# Patient Record
Sex: Female | Born: 1965 | Race: White | Hispanic: No | Marital: Married | Smoking: Former smoker
Health system: Southern US, Community
[De-identification: ages and names within clinical notes are randomized; demographics above are authoritative.]

## PROBLEM LIST (undated history)

## (undated) DIAGNOSIS — F319 Bipolar disorder, unspecified: Secondary | ICD-10-CM

## (undated) DIAGNOSIS — N189 Chronic kidney disease, unspecified: Secondary | ICD-10-CM

## (undated) DIAGNOSIS — E669 Obesity, unspecified: Secondary | ICD-10-CM

## (undated) DIAGNOSIS — F329 Major depressive disorder, single episode, unspecified: Secondary | ICD-10-CM

## (undated) DIAGNOSIS — K219 Gastro-esophageal reflux disease without esophagitis: Secondary | ICD-10-CM

## (undated) DIAGNOSIS — R55 Syncope and collapse: Secondary | ICD-10-CM

## (undated) DIAGNOSIS — F419 Anxiety disorder, unspecified: Secondary | ICD-10-CM

## (undated) DIAGNOSIS — T8859XA Other complications of anesthesia, initial encounter: Secondary | ICD-10-CM

## (undated) DIAGNOSIS — H269 Unspecified cataract: Secondary | ICD-10-CM

## (undated) DIAGNOSIS — G43909 Migraine, unspecified, not intractable, without status migrainosus: Secondary | ICD-10-CM

## (undated) DIAGNOSIS — D869 Sarcoidosis, unspecified: Secondary | ICD-10-CM

## (undated) DIAGNOSIS — Z8041 Family history of malignant neoplasm of ovary: Secondary | ICD-10-CM

## (undated) DIAGNOSIS — Z803 Family history of malignant neoplasm of breast: Secondary | ICD-10-CM

## (undated) DIAGNOSIS — K589 Irritable bowel syndrome without diarrhea: Secondary | ICD-10-CM

## (undated) DIAGNOSIS — G2581 Restless legs syndrome: Secondary | ICD-10-CM

## (undated) DIAGNOSIS — F32A Depression, unspecified: Secondary | ICD-10-CM

## (undated) DIAGNOSIS — M8580 Other specified disorders of bone density and structure, unspecified site: Secondary | ICD-10-CM

## (undated) DIAGNOSIS — E785 Hyperlipidemia, unspecified: Secondary | ICD-10-CM

## (undated) DIAGNOSIS — G4730 Sleep apnea, unspecified: Secondary | ICD-10-CM

## (undated) DIAGNOSIS — D649 Anemia, unspecified: Secondary | ICD-10-CM

## (undated) DIAGNOSIS — Z8489 Family history of other specified conditions: Secondary | ICD-10-CM

## (undated) DIAGNOSIS — G250 Essential tremor: Secondary | ICD-10-CM

## (undated) DIAGNOSIS — T5494XA Toxic effect of unspecified corrosive substance, undetermined, initial encounter: Secondary | ICD-10-CM

## (undated) DIAGNOSIS — E039 Hypothyroidism, unspecified: Secondary | ICD-10-CM

## (undated) HISTORY — PX: ABDOMINAL HYSTERECTOMY: SHX81

## (undated) HISTORY — PX: TOTAL ABDOMINAL HYSTERECTOMY: SHX209

## (undated) HISTORY — DX: Syncope and collapse: R55

## (undated) HISTORY — DX: Family history of malignant neoplasm of breast: Z80.3

## (undated) HISTORY — PX: INGUINAL HERNIA REPAIR: SHX194

## (undated) HISTORY — DX: Hyperlipidemia, unspecified: E78.5

## (undated) HISTORY — DX: Toxic effect of unspecified corrosive substance, undetermined, initial encounter: T54.94XA

## (undated) HISTORY — DX: Migraine, unspecified, not intractable, without status migrainosus: G43.909

## (undated) HISTORY — DX: Hypothyroidism, unspecified: E03.9

## (undated) HISTORY — PX: HERNIA REPAIR: SHX51

## (undated) HISTORY — PX: BLADDER SURGERY: SHX569

## (undated) HISTORY — PX: CHOLECYSTECTOMY: SHX55

## (undated) HISTORY — DX: Gastro-esophageal reflux disease without esophagitis: K21.9

## (undated) HISTORY — DX: Obesity, unspecified: E66.9

## (undated) HISTORY — DX: Depression, unspecified: F32.A

## (undated) HISTORY — PX: OTHER SURGICAL HISTORY: SHX169

## (undated) HISTORY — DX: Anxiety disorder, unspecified: F41.9

## (undated) HISTORY — DX: Unspecified cataract: H26.9

## (undated) HISTORY — PX: SALPINGOOPHORECTOMY: SHX82

## (undated) HISTORY — PX: APPENDECTOMY: SHX54

## (undated) HISTORY — DX: Major depressive disorder, single episode, unspecified: F32.9

## (undated) HISTORY — DX: Family history of malignant neoplasm of ovary: Z80.41

---

## 2002-10-19 DIAGNOSIS — Z90711 Acquired absence of uterus with remaining cervical stump: Secondary | ICD-10-CM

## 2002-10-19 HISTORY — DX: Acquired absence of uterus with remaining cervical stump: Z90.711

## 2002-10-19 HISTORY — PX: LAPAROSCOPIC SUPRACERVICAL HYSTERECTOMY: SUR797

## 2004-10-06 ENCOUNTER — Ambulatory Visit (HOSPITAL_COMMUNITY): Payer: Self-pay | Admitting: Professional Counselor

## 2004-10-15 ENCOUNTER — Ambulatory Visit (HOSPITAL_COMMUNITY): Payer: Self-pay | Admitting: Professional Counselor

## 2004-10-20 ENCOUNTER — Ambulatory Visit (HOSPITAL_COMMUNITY): Payer: Self-pay | Admitting: Professional Counselor

## 2004-10-27 ENCOUNTER — Ambulatory Visit (HOSPITAL_COMMUNITY): Payer: Self-pay | Admitting: Professional Counselor

## 2005-12-29 ENCOUNTER — Ambulatory Visit: Payer: Self-pay | Admitting: Psychiatry

## 2006-07-16 ENCOUNTER — Emergency Department: Payer: Self-pay | Admitting: Emergency Medicine

## 2006-08-09 ENCOUNTER — Emergency Department: Payer: Self-pay | Admitting: Emergency Medicine

## 2006-08-10 ENCOUNTER — Ambulatory Visit: Payer: Self-pay | Admitting: Emergency Medicine

## 2006-08-31 ENCOUNTER — Inpatient Hospital Stay: Payer: Self-pay

## 2006-12-09 ENCOUNTER — Emergency Department: Payer: Self-pay

## 2006-12-09 ENCOUNTER — Other Ambulatory Visit: Payer: Self-pay

## 2007-06-16 ENCOUNTER — Emergency Department: Payer: Self-pay | Admitting: Emergency Medicine

## 2007-06-30 ENCOUNTER — Emergency Department: Payer: Self-pay | Admitting: Unknown Physician Specialty

## 2007-07-06 ENCOUNTER — Emergency Department: Payer: Self-pay | Admitting: Emergency Medicine

## 2007-07-06 ENCOUNTER — Other Ambulatory Visit: Payer: Self-pay

## 2007-07-28 ENCOUNTER — Other Ambulatory Visit: Payer: Self-pay

## 2007-07-28 ENCOUNTER — Emergency Department: Payer: Self-pay | Admitting: Emergency Medicine

## 2007-08-10 ENCOUNTER — Ambulatory Visit: Payer: Self-pay | Admitting: General Surgery

## 2007-08-26 ENCOUNTER — Ambulatory Visit: Payer: Self-pay | Admitting: General Surgery

## 2007-09-01 ENCOUNTER — Inpatient Hospital Stay: Payer: Self-pay | Admitting: General Surgery

## 2008-01-31 ENCOUNTER — Emergency Department: Payer: Self-pay | Admitting: Emergency Medicine

## 2008-04-18 ENCOUNTER — Ambulatory Visit: Payer: Self-pay | Admitting: Oncology

## 2008-04-19 ENCOUNTER — Other Ambulatory Visit: Payer: Self-pay

## 2008-04-19 ENCOUNTER — Observation Stay: Payer: Self-pay | Admitting: Internal Medicine

## 2008-04-23 ENCOUNTER — Ambulatory Visit: Payer: Self-pay | Admitting: Oncology

## 2008-04-30 ENCOUNTER — Ambulatory Visit: Payer: Self-pay | Admitting: Oncology

## 2008-05-19 ENCOUNTER — Ambulatory Visit: Payer: Self-pay | Admitting: Oncology

## 2008-05-25 ENCOUNTER — Ambulatory Visit: Payer: Self-pay | Admitting: General Surgery

## 2008-05-29 ENCOUNTER — Ambulatory Visit: Payer: Self-pay | Admitting: General Surgery

## 2008-06-19 ENCOUNTER — Ambulatory Visit: Payer: Self-pay | Admitting: Oncology

## 2009-08-02 ENCOUNTER — Ambulatory Visit: Payer: Self-pay | Admitting: General Surgery

## 2009-08-09 ENCOUNTER — Ambulatory Visit: Payer: Self-pay | Admitting: General Surgery

## 2010-03-12 ENCOUNTER — Ambulatory Visit: Payer: Self-pay | Admitting: Cardiovascular Disease

## 2010-03-24 ENCOUNTER — Ambulatory Visit: Payer: Self-pay | Admitting: Neurology

## 2010-03-25 ENCOUNTER — Ambulatory Visit: Payer: Self-pay | Admitting: Neurology

## 2010-04-25 ENCOUNTER — Ambulatory Visit: Payer: Self-pay | Admitting: Physician Assistant

## 2010-06-17 ENCOUNTER — Emergency Department: Payer: Self-pay | Admitting: Emergency Medicine

## 2010-10-05 ENCOUNTER — Emergency Department: Payer: Self-pay | Admitting: Emergency Medicine

## 2011-04-21 ENCOUNTER — Emergency Department: Payer: Self-pay | Admitting: Internal Medicine

## 2011-10-29 ENCOUNTER — Emergency Department: Payer: Self-pay | Admitting: Emergency Medicine

## 2011-11-24 ENCOUNTER — Emergency Department: Payer: Self-pay | Admitting: Emergency Medicine

## 2012-01-14 ENCOUNTER — Encounter: Payer: Self-pay | Admitting: Internal Medicine

## 2012-02-11 ENCOUNTER — Encounter: Payer: Self-pay | Admitting: Internal Medicine

## 2012-02-17 ENCOUNTER — Encounter: Payer: Self-pay | Admitting: Internal Medicine

## 2012-03-15 ENCOUNTER — Encounter: Payer: Self-pay | Admitting: *Deleted

## 2012-03-15 ENCOUNTER — Other Ambulatory Visit: Payer: Self-pay | Admitting: *Deleted

## 2012-03-17 ENCOUNTER — Ambulatory Visit (INDEPENDENT_AMBULATORY_CARE_PROVIDER_SITE_OTHER): Payer: BC Managed Care – PPO | Admitting: Internal Medicine

## 2012-03-17 ENCOUNTER — Encounter: Payer: Self-pay | Admitting: Internal Medicine

## 2012-03-17 VITALS — BP 102/68 | HR 88 | Ht 64.0 in | Wt 207.0 lb

## 2012-03-17 DIAGNOSIS — F329 Major depressive disorder, single episode, unspecified: Secondary | ICD-10-CM

## 2012-03-17 DIAGNOSIS — R55 Syncope and collapse: Secondary | ICD-10-CM | POA: Insufficient documentation

## 2012-03-17 DIAGNOSIS — I471 Supraventricular tachycardia: Secondary | ICD-10-CM

## 2012-03-17 DIAGNOSIS — I498 Other specified cardiac arrhythmias: Secondary | ICD-10-CM

## 2012-03-17 DIAGNOSIS — G43909 Migraine, unspecified, not intractable, without status migrainosus: Secondary | ICD-10-CM

## 2012-03-17 NOTE — Assessment & Plan Note (Signed)
As above.

## 2012-03-17 NOTE — Progress Notes (Deleted)
   Patient ID: Jennifer Bright, female    DOB: 1966/04/07, 46 y.o.   MRN: [id number]  HPI    Review of Systems    Physical Exam

## 2012-03-17 NOTE — Assessment & Plan Note (Signed)
The patient has recurrent syncope with documented normal rhythm by previous event recorder. There are some epiphenomena that suggest a neurally mediated episode; the lack of pallor speaking against it. Furthermore, I am not sure why either Lamictal or Topamax would be effective in a neurally mediated syndrome; I will have to rate looked this up. In any case, given the recurrence is, I think it is reasonable to undertake tilt table testing. Given the fact that multiple times after an episode her vital signs have been recorded as normal, and arterial line will be necessary at the time of the procedure. I reviewed this with the family

## 2012-03-17 NOTE — Progress Notes (Signed)
 History and Physical  Patient ID: Jennifer Bright MRN: [id number], SOB: 11/02/1965 46 y.o. Date of Encounter: 03/17/2012, 5:59 PM  Primary Physician: No primary provider on file. Primary Cardiologist: WST Primary Electrophysiologist:  SK  Chief Complaint: SYNCOPE  History of Present Illness: Jennifer Bright is a 46 y.o. female with a very complicated past history of syncope and depression and migrainES   the syncope started about 6 or 7 years ago temporally associated with a hysterectomy. 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 More recently she seen a different neurologist who treated her with the medical and her frequency of episodes has decreased to once every week or so.  These episodes are stereotypical. She feels like her eyes are then pop out she gets quite flushed there is residual orthostatic intolerance flushing nausea there is no accompanying diaphoresis. There is significant fatigue and she often needs to sleep. She is described by her husband as NOT changing color ; they last 8-10 minutes.  A recent echo demonstrated normal left ventricular function. Her past medical history is as noted above in addition, she has hypothyroidism.  It is firm compression at the symptoms are much worse with stress of which there is a great deal in her life  She is on multiple psychotropic drugs under the care of a psychiatrist. Past Medical History  Diagnosis Date  . Syncope \  . Hypothyroid   . Diabetes mellitus     NIDD  . Hyperlipidemia   . Obesity   . Migraine headache   . Depression      Past Surgical History  Procedure Date  . Appendectomy   . Cholecystectomy   . Abdominal hysterectomy   . Inguinal hernia repair     rt and left  . Lexiscan cardiolite       Current  Outpatient Prescriptions  Medication Sig Dispense Refill  . ALPRAZolam (XANAX) 1 MG tablet Take 1 mg by mouth at bedtime as needed.      . Asenapine Maleate (SAPHRIS) 10 MG SUBL Place 10 mg under the tongue.      . atorvastatin (LIPITOR) 10 MG tablet Take 10 mg by mouth daily.      . buPROPion (WELLBUTRIN XL) 300 MG 24 hr tablet Take 300 mg by mouth daily.      . Cholecalciferol (VITAMIN D-3 PO) Take 1,000 mg by mouth daily.      . lamoTRIgine (LAMICTAL) 25 MG tablet Take 25 mg by mouth. 2 Tablets BID      . levothyroxine (SYNTHROID, LEVOTHROID) 150 MCG tablet Take 150 mcg by mouth. Taking 1.5 Tablets QD      . metFORMIN (GLUCOPHAGE) 500 MG tablet Take 500 mg by mouth 2 (two) times daily with a meal.      . modafinil (PROVIGIL) 200 MG tablet Take 200 mg by mouth daily.      . Nutritional Supplements (MELATONIN PO) Take 10 mg by mouth daily.      . primidone (MYSOLINE) 50 MG tablet Take 50 mg by mouth 2 (two) times daily.      . prochlorperazine (COMPAZINE) 10 MG tablet Take 10 mg by mouth every 6 (six) hours as needed.      . RISPERIDONE PO Take 1 mg by mouth daily.      . sucralfate (CARAFATE)   1 G tablet Take 1 g by mouth 4 (four) times daily.      . topiramate (TOPAMAX) 200 MG tablet Take 200 mg by mouth 2 (two) times daily.      . Vilazodone HCl (VIIBRYD) 40 MG TABS Take 40 mg by mouth daily.         Allergies: No Known Allergies   History  Substance Use Topics  . Smoking status: Former Smoker    Types: Cigarettes  . Smokeless tobacco: Never Used  . Alcohol Use: No      Family History  Problem Relation Age of Onset  . Cancer Father   . Hypertension Mother   . Hyperlipidemia Mother   . Alcohol abuse Brother   . Irritable bowel syndrome Sister       ROS:  Please see the history of present illness.      All other systems reviewed and negative.   Vital Signs: Blood pressure 102/68, pulse 88, height 5' 4" (1.626 m), weight 207 lb (93.895 kg).  PHYSICAL EXAM: General:   Well nourished, well developed female in no acute distress sitting in a wheelchair with a flat affect HEENT: normal Lymph: no adenopathy Neck: no JVD Endocrine:  No thryomegaly Vascular: No carotid bruits; FA pulses 2+ bilaterally without bruits Cardiac:  normal S1, S2; RRR; no murmur Back: without kyphosis/scoliosis, no CVA tenderness Lungs:  clear to auscultation bilaterally, no wheezing, rhonchi or rales Abd: soft, nontender, no hepatomegaly Ext: no edema Musculoskeletal:  No deformities, BUE and BLE strength normal and equal Skin: warm and dry Neuro:  CNs 2-12 intact, no focal abnormalities noted Psych:  Normal affect   EKG:  Normal sinus rhythm at a rate of 75 intervals 18/09/38 Labs:   No results found for this basename: WBC, HGB, HCT, MCV, PLT   No results found for this basename: NA,K,CL,CO2,BUN,CREATININE,CALCIUM,LABALBU,PROT,BILITOT,ALKPHOS,ALT,AST,GLUCOSE in the last 168 hours No results found for this basename: CKTOTAL:4,CKMB:4,TROPONINI:4 in the last 72 hours No results found for this basename: CHOL, HDL, LDLCALC, TRIG   No results found for this basename: DDIMER   BNP No results found for this basename: probnp       ASSESSMENT AND PLAN:        

## 2012-03-17 NOTE — Patient Instructions (Addendum)
Your physician has recommended that you have a tilt table test. This test is sometimes used to help determine the cause of fainting spells. You lie on a table that moves from a lying down to an upright position. The change in position can bring on loss of consciousness. The doctor monitors your symptoms, heart rate, EKG, and blood pressure throughout the test. The doctor also may give you a medicine and then monitor your response to the medicine. This is done in the hospital and usually takes half of a day to complete the procedure. Please see the instruction sheet given to you today for more information.  Tilt Table Testing: 1) Scheduled for Friday 04/01/12 at 12:30 pm with Dr. Graciela Husbands. 2) Please arrive at the [facility] at 10:30 am the morning of your procedure. 3) Do not eat or drink anything after midnight the night before your procedure. 4) Please do not take your glucophage the morning of your procedure. 5)You may take all of your other medications the morning of your procedure with enough water to get them down safely. 6) Bring a current list of medications and your current insurance cards to the hospital with you.

## 2012-03-23 ENCOUNTER — Encounter (HOSPITAL_COMMUNITY): Payer: Self-pay | Admitting: Respiratory Therapy

## 2012-03-31 ENCOUNTER — Encounter: Payer: Self-pay | Admitting: Internal Medicine

## 2012-03-31 MED ORDER — SODIUM CHLORIDE 0.9 % IV SOLN
INTRAVENOUS | Status: DC
  Administered 2012-04-01: 11:00:00 via INTRAVENOUS

## 2012-04-01 ENCOUNTER — Encounter (HOSPITAL_COMMUNITY)
Admission: RE | Disposition: A | Discharge status: Home / Self Care | Payer: Self-pay | Source: Ambulatory Visit | Attending: Internal Medicine

## 2012-04-01 ENCOUNTER — Ambulatory Visit (HOSPITAL_COMMUNITY)
Admission: RE | Admit: 2012-04-01 | Discharge: 2012-04-01 | Disposition: A | Discharge status: Home / Self Care | Payer: BC Managed Care – PPO | Source: Ambulatory Visit | Attending: Internal Medicine | Admitting: Internal Medicine

## 2012-04-01 DIAGNOSIS — E785 Hyperlipidemia, unspecified: Secondary | ICD-10-CM | POA: Insufficient documentation

## 2012-04-01 DIAGNOSIS — I471 Supraventricular tachycardia: Secondary | ICD-10-CM

## 2012-04-01 DIAGNOSIS — E039 Hypothyroidism, unspecified: Secondary | ICD-10-CM | POA: Insufficient documentation

## 2012-04-01 DIAGNOSIS — R55 Syncope and collapse: Secondary | ICD-10-CM

## 2012-04-01 DIAGNOSIS — E119 Type 2 diabetes mellitus without complications: Secondary | ICD-10-CM | POA: Insufficient documentation

## 2012-04-01 DIAGNOSIS — E669 Obesity, unspecified: Secondary | ICD-10-CM | POA: Insufficient documentation

## 2012-04-01 HISTORY — PX: TILT TABLE STUDY: SHX5493

## 2012-04-01 LAB — GLUCOSE, CAPILLARY: Glucose-Capillary: 120 mg/dL — ABNORMAL HIGH (ref 70–99)

## 2012-04-01 SURGERY — TILT TABLE STUDY
Anesthesia: LOCAL

## 2012-04-01 MED ORDER — IBUPROFEN 800 MG PO TABS
400.0000 mg | ORAL_TABLET | Freq: Once | ORAL | Status: AC
  Administered 2012-04-01: 400 mg via ORAL

## 2012-04-01 NOTE — CV Procedure (Signed)
Neg tilt test with and without nitroglycerin

## 2012-04-01 NOTE — H&P (View-Only) (Signed)
History and Physical  Patient ID: Jennifer Bright MRN: [id number], SOB: 1965-11-04 46 y.o. Date of Encounter: 03/17/2012, 5:59 PM  Primary Physician: No primary provider on file. Primary Cardiologist: WST Primary Electrophysiologist:  SK  Chief Complaint: SYNCOPE  History of Present Illness: Jennifer Bright is a 46 y.o. female with a very complicated past history of syncope and depression and migrainES   the syncope started about 6 or 7 years ago temporally associated with a hysterectomy. 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 More recently she seen a different neurologist who treated her with the medical and her frequency of episodes has decreased to once every week or so.  These episodes are stereotypical. She feels like her eyes are then pop out she gets quite flushed there is residual orthostatic intolerance flushing nausea there is no accompanying diaphoresis. There is significant fatigue and she often needs to sleep. She is described by her husband as NOT changing color ; they last 8-10 minutes.  A recent echo demonstrated normal left ventricular function. Her past medical history is as noted above in addition, she has hypothyroidism.  It is firm compression at the symptoms are much worse with stress of which there is a great deal in her life  She is on multiple psychotropic drugs under the care of a psychiatrist. Past Medical History  Diagnosis Date  . Syncope \  . Hypothyroid   . Diabetes mellitus     NIDD  . Hyperlipidemia   . Obesity   . Migraine headache   . Depression      Past Surgical History  Procedure Date  . Appendectomy   . Cholecystectomy   . Abdominal hysterectomy   . Inguinal hernia repair     rt and left  . Lexiscan cardiolite       Current  Outpatient Prescriptions  Medication Sig Dispense Refill  . ALPRAZolam (XANAX) 1 MG tablet Take 1 mg by mouth at bedtime as needed.      . Asenapine Maleate (SAPHRIS) 10 MG SUBL Place 10 mg under the tongue.      Marland Kitchen atorvastatin (LIPITOR) 10 MG tablet Take 10 mg by mouth daily.      Marland Kitchen buPROPion (WELLBUTRIN XL) 300 MG 24 hr tablet Take 300 mg by mouth daily.      . Cholecalciferol (VITAMIN D-3 PO) Take 1,000 mg by mouth daily.      Marland Kitchen lamoTRIgine (LAMICTAL) 25 MG tablet Take 25 mg by mouth. 2 Tablets BID      . levothyroxine (SYNTHROID, LEVOTHROID) 150 MCG tablet Take 150 mcg by mouth. Taking 1.5 Tablets QD      . metFORMIN (GLUCOPHAGE) 500 MG tablet Take 500 mg by mouth 2 (two) times daily with a meal.      . modafinil (PROVIGIL) 200 MG tablet Take 200 mg by mouth daily.      . Nutritional Supplements (MELATONIN PO) Take 10 mg by mouth daily.      . primidone (MYSOLINE) 50 MG tablet Take 50 mg by mouth 2 (two) times daily.      . prochlorperazine (COMPAZINE) 10 MG tablet Take 10 mg by mouth every 6 (six) hours as needed.      Marland Kitchen RISPERIDONE PO Take 1 mg by mouth daily.      . sucralfate (CARAFATE)  1 G tablet Take 1 g by mouth 4 (four) times daily.      Marland Kitchen topiramate (TOPAMAX) 200 MG tablet Take 200 mg by mouth 2 (two) times daily.      . Vilazodone HCl (VIIBRYD) 40 MG TABS Take 40 mg by mouth daily.         Allergies: No Known Allergies   History  Substance Use Topics  . Smoking status: Former Smoker    Types: Cigarettes  . Smokeless tobacco: Never Used  . Alcohol Use: No      Family History  Problem Relation Age of Onset  . Cancer Father   . Hypertension Mother   . Hyperlipidemia Mother   . Alcohol abuse Brother   . Irritable bowel syndrome Sister       ROS:  Please see the history of present illness.      All other systems reviewed and negative.   Vital Signs: Blood pressure 102/68, pulse 88, height 5\' 4"  (1.626 m), weight 207 lb (93.895 kg).  PHYSICAL EXAM: General:   Well nourished, well developed female in no acute distress sitting in a wheelchair with a flat affect HEENT: normal Lymph: no adenopathy Neck: no JVD Endocrine:  No thryomegaly Vascular: No carotid bruits; FA pulses 2+ bilaterally without bruits Cardiac:  normal S1, S2; RRR; no murmur Back: without kyphosis/scoliosis, no CVA tenderness Lungs:  clear to auscultation bilaterally, no wheezing, rhonchi or rales Abd: soft, nontender, no hepatomegaly Ext: no edema Musculoskeletal:  No deformities, BUE and BLE strength normal and equal Skin: warm and dry Neuro:  CNs 2-12 intact, no focal abnormalities noted Psych:  Normal affect   EKG:  Normal sinus rhythm at a rate of 75 intervals 18/09/38 Labs:   No results found for this basename: WBC, HGB, HCT, MCV, PLT   No results found for this basename: NA,K,CL,CO2,BUN,CREATININE,CALCIUM,LABALBU,PROT,BILITOT,ALKPHOS,ALT,AST,GLUCOSE in the last 168 hours No results found for this basename: CKTOTAL:4,CKMB:4,TROPONINI:4 in the last 72 hours No results found for this basename: CHOL, HDL, LDLCALC, TRIG   No results found for this basename: DDIMER   BNP No results found for this basename: probnp       ASSESSMENT AND PLAN:

## 2012-04-01 NOTE — Interval H&P Note (Signed)
History and Physical Interval Note:  04/01/2012 10:25 AM  Jennifer Bright  has presented today for surgery, with the diagnosis of Syncope  The various methods of treatment have been discussed with the patient and family. After consideration of risks, benefits and other options for treatment, the patient has consented to  Procedure(s) (LRB): TILT TABLE STUDY (N/A) as a surgical intervention .  The patients' history has been reviewed, patient examined, no change in status, stable for surgery.  I have reviewed the patients' chart and labs.  Questions were answered to the patient's satisfaction.     Sherryl Manges  No changes Pt tells me she has undergone ECT for depression complicated by acusatory auditory hallucinations which were some improved by ECT  None in a couple of years

## 2012-05-16 ENCOUNTER — Ambulatory Visit: Payer: Self-pay | Admitting: Family Medicine

## 2012-05-19 ENCOUNTER — Ambulatory Visit: Payer: Self-pay | Admitting: Family Medicine

## 2012-05-31 ENCOUNTER — Other Ambulatory Visit (HOSPITAL_COMMUNITY): Payer: Self-pay | Admitting: Cardiology

## 2012-05-31 DIAGNOSIS — R079 Chest pain, unspecified: Secondary | ICD-10-CM

## 2012-06-09 ENCOUNTER — Other Ambulatory Visit (HOSPITAL_COMMUNITY): Payer: BC Managed Care – PPO

## 2012-06-19 ENCOUNTER — Ambulatory Visit: Payer: Self-pay | Admitting: Family Medicine

## 2012-06-30 ENCOUNTER — Ambulatory Visit: Payer: Self-pay | Admitting: General Surgery

## 2012-08-17 ENCOUNTER — Ambulatory Visit: Payer: Self-pay | Admitting: General Surgery

## 2012-09-03 DIAGNOSIS — G43709 Chronic migraine without aura, not intractable, without status migrainosus: Secondary | ICD-10-CM | POA: Insufficient documentation

## 2012-09-03 DIAGNOSIS — G47419 Narcolepsy without cataplexy: Secondary | ICD-10-CM | POA: Insufficient documentation

## 2014-04-08 ENCOUNTER — Emergency Department: Payer: Self-pay | Admitting: Emergency Medicine

## 2014-04-08 LAB — URINALYSIS, COMPLETE
Bacteria: NONE SEEN
Bilirubin,UR: NEGATIVE
Glucose,UR: NEGATIVE mg/dL (ref 0–75)
Ketone: NEGATIVE
Leukocyte Esterase: NEGATIVE
NITRITE: NEGATIVE
Ph: 6 (ref 4.5–8.0)
Protein: 30
RBC,UR: 2465 /HPF (ref 0–5)
SPECIFIC GRAVITY: 1.023 (ref 1.003–1.030)
Squamous Epithelial: 1
WBC UR: NONE SEEN /HPF (ref 0–5)

## 2014-04-08 LAB — CBC
HCT: 39.2 % (ref 35.0–47.0)
HGB: 13.3 g/dL (ref 12.0–16.0)
MCH: 30.4 pg (ref 26.0–34.0)
MCHC: 33.9 g/dL (ref 32.0–36.0)
MCV: 90 fL (ref 80–100)
Platelet: 268 10*3/uL (ref 150–440)
RBC: 4.37 10*6/uL (ref 3.80–5.20)
RDW: 13.4 % (ref 11.5–14.5)
WBC: 8.9 10*3/uL (ref 3.6–11.0)

## 2014-04-08 LAB — COMPREHENSIVE METABOLIC PANEL
ANION GAP: 10 (ref 7–16)
AST: 20 U/L (ref 15–37)
Albumin: 3.6 g/dL (ref 3.4–5.0)
Alkaline Phosphatase: 75 U/L
BUN: 12 mg/dL (ref 7–18)
Bilirubin,Total: 0.3 mg/dL (ref 0.2–1.0)
CREATININE: 1.03 mg/dL (ref 0.60–1.30)
Calcium, Total: 9.9 mg/dL (ref 8.5–10.1)
Chloride: 109 mmol/L — ABNORMAL HIGH (ref 98–107)
Co2: 22 mmol/L (ref 21–32)
EGFR (African American): >60
EGFR (Non-African Amer.): >60
GLUCOSE: 121 mg/dL — AB (ref 65–99)
Osmolality: 282 (ref 275–301)
POTASSIUM: 3.9 mmol/L (ref 3.5–5.1)
SGPT (ALT): 29 U/L (ref 12–78)
Sodium: 141 mmol/L (ref 136–145)
TOTAL PROTEIN: 7.1 g/dL (ref 6.4–8.2)

## 2014-04-10 LAB — URINE CULTURE

## 2014-04-18 ENCOUNTER — Ambulatory Visit: Payer: Self-pay | Admitting: Oncology

## 2014-05-16 ENCOUNTER — Ambulatory Visit: Payer: Self-pay | Admitting: Physician Assistant

## 2014-05-16 ENCOUNTER — Inpatient Hospital Stay: Payer: Self-pay | Admitting: Internal Medicine

## 2014-05-16 LAB — CBC
HCT: 42.2 % (ref 35.0–47.0)
HGB: 14.1 g/dL (ref 12.0–16.0)
MCH: 30.7 pg (ref 26.0–34.0)
MCHC: 33.5 g/dL (ref 32.0–36.0)
MCV: 91 fL (ref 80–100)
Platelet: 236 10*3/uL (ref 150–440)
RBC: 4.61 10*6/uL (ref 3.80–5.20)
RDW: 13.8 % (ref 11.5–14.5)
WBC: 10.6 10*3/uL (ref 3.6–11.0)

## 2014-05-16 LAB — URINALYSIS, COMPLETE
Bilirubin,UR: NEGATIVE
GLUCOSE, UR: NEGATIVE mg/dL (ref 0–75)
KETONE: NEGATIVE
Nitrite: NEGATIVE
PROTEIN: NEGATIVE
Ph: 5 (ref 4.5–8.0)

## 2014-05-16 LAB — COMPREHENSIVE METABOLIC PANEL
ALT: 114 U/L — AB
ANION GAP: 7 (ref 7–16)
Albumin: 3.7 g/dL (ref 3.4–5.0)
Alkaline Phosphatase: 94 U/L
BUN: 11 mg/dL (ref 7–18)
Bilirubin,Total: 0.3 mg/dL (ref 0.2–1.0)
CO2: 24 mmol/L (ref 21–32)
CREATININE: 0.94 mg/dL (ref 0.60–1.30)
Calcium, Total: 8.9 mg/dL (ref 8.5–10.1)
Chloride: 110 mmol/L — ABNORMAL HIGH (ref 98–107)
EGFR (Non-African Amer.): >60
GLUCOSE: 118 mg/dL — AB (ref 65–99)
Osmolality: 282 (ref 275–301)
Potassium: 4 mmol/L (ref 3.5–5.1)
SGOT(AST): 163 U/L — ABNORMAL HIGH (ref 15–37)
Sodium: 141 mmol/L (ref 136–145)
Total Protein: 7.4 g/dL (ref 6.4–8.2)

## 2014-05-16 LAB — LIPASE, BLOOD: LIPASE: 888 U/L — AB (ref 73–393)

## 2014-05-17 LAB — COMPREHENSIVE METABOLIC PANEL
Albumin: 3.1 g/dL — ABNORMAL LOW (ref 3.4–5.0)
Alkaline Phosphatase: 162 U/L — ABNORMAL HIGH
Anion Gap: 8 (ref 7–16)
BUN: 9 mg/dL (ref 7–18)
Bilirubin,Total: 1.3 mg/dL — ABNORMAL HIGH (ref 0.2–1.0)
Calcium, Total: 8.1 mg/dL — ABNORMAL LOW (ref 8.5–10.1)
Chloride: 114 mmol/L — ABNORMAL HIGH (ref 98–107)
Co2: 24 mmol/L (ref 21–32)
Creatinine: 1 mg/dL (ref 0.60–1.30)
EGFR (African American): >60
EGFR (Non-African Amer.): >60
Glucose: 115 mg/dL — ABNORMAL HIGH (ref 65–99)
Osmolality: 290 (ref 275–301)
Potassium: 3.7 mmol/L (ref 3.5–5.1)
SGOT(AST): 1355 U/L — ABNORMAL HIGH (ref 15–37)
SGPT (ALT): 1299 U/L — ABNORMAL HIGH
Sodium: 146 mmol/L — ABNORMAL HIGH (ref 136–145)
Total Protein: 6.5 g/dL (ref 6.4–8.2)

## 2014-05-17 LAB — CBC WITH DIFFERENTIAL/PLATELET
Basophil #: 0.1 10*3/uL (ref 0.0–0.1)
Basophil %: 1.9 %
Eosinophil #: 0.2 10*3/uL (ref 0.0–0.7)
Eosinophil %: 5.8 %
HCT: 37.9 % (ref 35.0–47.0)
HGB: 12.6 g/dL (ref 12.0–16.0)
Lymphocyte #: 1.3 10*3/uL (ref 1.0–3.6)
Lymphocyte %: 34.3 %
MCH: 30.3 pg (ref 26.0–34.0)
MCHC: 33.2 g/dL (ref 32.0–36.0)
MCV: 91 fL (ref 80–100)
Monocyte #: 0.3 x10 3/mm (ref 0.2–0.9)
Monocyte %: 9.4 %
Neutrophil #: 1.8 10*3/uL (ref 1.4–6.5)
Neutrophil %: 48.6 %
Platelet: 175 10*3/uL (ref 150–440)
RBC: 4.16 10*6/uL (ref 3.80–5.20)
RDW: 13.9 % (ref 11.5–14.5)
WBC: 3.7 10*3/uL (ref 3.6–11.0)

## 2014-05-17 LAB — PROTIME-INR
INR: 1
Prothrombin Time: 13.3 secs (ref 11.5–14.7)

## 2014-05-17 LAB — APTT: Activated PTT: 30.1 secs (ref 23.6–35.9)

## 2014-05-17 LAB — ACETAMINOPHEN LEVEL: Acetaminophen: <2 ug/mL

## 2014-05-17 LAB — CK: CK, Total: 54 U/L

## 2014-05-17 LAB — TSH: Thyroid Stimulating Horm: 0.05 u[IU]/mL — ABNORMAL LOW

## 2014-05-17 LAB — LIPID PANEL
Cholesterol: 157 mg/dL (ref 0–200)
HDL Cholesterol: 29 mg/dL — ABNORMAL LOW (ref 40–60)
Ldl Cholesterol, Calc: 74 mg/dL (ref 0–100)
Triglycerides: 271 mg/dL — ABNORMAL HIGH (ref 0–200)
VLDL Cholesterol, Calc: 54 mg/dL — ABNORMAL HIGH (ref 5–40)

## 2014-05-17 LAB — HEMOGLOBIN A1C: Hemoglobin A1C: 6.2 % (ref 4.2–6.3)

## 2014-05-17 LAB — LIPASE, BLOOD: Lipase: 586 U/L — ABNORMAL HIGH (ref 73–393)

## 2014-05-18 LAB — COMPREHENSIVE METABOLIC PANEL
ALBUMIN: 3.1 g/dL — AB (ref 3.4–5.0)
AST: 330 U/L — AB (ref 15–37)
Alkaline Phosphatase: 211 U/L — ABNORMAL HIGH
Anion Gap: 7 (ref 7–16)
BUN: 5 mg/dL — ABNORMAL LOW (ref 7–18)
Bilirubin,Total: 0.9 mg/dL (ref 0.2–1.0)
CREATININE: 0.81 mg/dL (ref 0.60–1.30)
Calcium, Total: 8.3 mg/dL — ABNORMAL LOW (ref 8.5–10.1)
Chloride: 116 mmol/L — ABNORMAL HIGH (ref 98–107)
Co2: 22 mmol/L (ref 21–32)
EGFR (Non-African Amer.): >60
Glucose: 137 mg/dL — ABNORMAL HIGH (ref 65–99)
OSMOLALITY: 288 (ref 275–301)
POTASSIUM: 3.9 mmol/L (ref 3.5–5.1)
SGPT (ALT): 791 U/L — ABNORMAL HIGH
Sodium: 145 mmol/L (ref 136–145)
TOTAL PROTEIN: 6.4 g/dL (ref 6.4–8.2)

## 2014-05-18 LAB — LIPASE, BLOOD: LIPASE: 109 U/L (ref 73–393)

## 2014-05-18 LAB — URINE CULTURE

## 2014-05-19 ENCOUNTER — Ambulatory Visit: Payer: Self-pay | Admitting: Oncology

## 2014-05-19 LAB — COMPREHENSIVE METABOLIC PANEL
ALK PHOS: 189 U/L — AB
ALT: 524 U/L — AB
Albumin: 3.3 g/dL — ABNORMAL LOW (ref 3.4–5.0)
Anion Gap: 7 (ref 7–16)
BILIRUBIN TOTAL: 0.5 mg/dL (ref 0.2–1.0)
BUN: 6 mg/dL — AB (ref 7–18)
CALCIUM: 9.2 mg/dL (ref 8.5–10.1)
CHLORIDE: 116 mmol/L — AB (ref 98–107)
CO2: 22 mmol/L (ref 21–32)
CREATININE: 0.79 mg/dL (ref 0.60–1.30)
EGFR (Non-African Amer.): >60
Glucose: 123 mg/dL — ABNORMAL HIGH (ref 65–99)
Osmolality: 288 (ref 275–301)
Potassium: 3.6 mmol/L (ref 3.5–5.1)
SGOT(AST): 104 U/L — ABNORMAL HIGH (ref 15–37)
Sodium: 145 mmol/L (ref 136–145)
TOTAL PROTEIN: 6.9 g/dL (ref 6.4–8.2)

## 2014-06-04 ENCOUNTER — Ambulatory Visit: Payer: Self-pay | Admitting: Oncology

## 2014-06-19 ENCOUNTER — Ambulatory Visit: Payer: Self-pay | Admitting: Oncology

## 2014-06-26 ENCOUNTER — Ambulatory Visit: Payer: Self-pay | Admitting: Anesthesiology

## 2014-06-28 ENCOUNTER — Ambulatory Visit: Payer: Self-pay | Admitting: Internal Medicine

## 2014-07-02 LAB — PATHOLOGY REPORT

## 2014-07-19 ENCOUNTER — Ambulatory Visit: Payer: Self-pay | Admitting: Oncology

## 2014-09-27 ENCOUNTER — Encounter (HOSPITAL_COMMUNITY): Payer: Self-pay | Admitting: Internal Medicine

## 2014-10-08 ENCOUNTER — Ambulatory Visit: Payer: Self-pay | Admitting: Oncology

## 2014-10-10 LAB — COMPREHENSIVE METABOLIC PANEL
ALBUMIN: 3.9 g/dL (ref 3.4–5.0)
ALK PHOS: 93 U/L
ALT: 38 U/L
Anion Gap: 12 (ref 7–16)
BILIRUBIN TOTAL: 0.3 mg/dL (ref 0.2–1.0)
BUN: 14 mg/dL (ref 7–18)
CREATININE: 0.79 mg/dL (ref 0.60–1.30)
Calcium, Total: 8.8 mg/dL (ref 8.5–10.1)
Chloride: 106 mmol/L (ref 98–107)
Co2: 25 mmol/L (ref 21–32)
EGFR (African American): >60
GLUCOSE: 180 mg/dL — AB (ref 65–99)
OSMOLALITY: 290 (ref 275–301)
Potassium: 3.8 mmol/L (ref 3.5–5.1)
SGOT(AST): 12 U/L — ABNORMAL LOW (ref 15–37)
SODIUM: 143 mmol/L (ref 136–145)
TOTAL PROTEIN: 7.4 g/dL (ref 6.4–8.2)

## 2014-10-10 LAB — CBC CANCER CENTER
BASOS ABS: 0.1 x10 3/mm (ref 0.0–0.1)
BASOS PCT: 1.4 %
EOS ABS: 0.4 x10 3/mm (ref 0.0–0.7)
Eosinophil %: 6.1 %
HCT: 42.6 % (ref 35.0–47.0)
HGB: 14.2 g/dL (ref 12.0–16.0)
LYMPHS PCT: 25.2 %
Lymphocyte #: 1.5 x10 3/mm (ref 1.0–3.6)
MCH: 30 pg (ref 26.0–34.0)
MCHC: 33.3 g/dL (ref 32.0–36.0)
MCV: 90 fL (ref 80–100)
Monocyte #: 0.3 x10 3/mm (ref 0.2–0.9)
Monocyte %: 5.4 %
NEUTROS ABS: 3.7 x10 3/mm (ref 1.4–6.5)
NEUTROS PCT: 61.9 %
PLATELETS: 246 x10 3/mm (ref 150–440)
RBC: 4.73 10*6/uL (ref 3.80–5.20)
RDW: 13.2 % (ref 11.5–14.5)
WBC: 5.9 x10 3/mm (ref 3.6–11.0)

## 2014-10-19 ENCOUNTER — Ambulatory Visit: Payer: Self-pay | Admitting: Oncology

## 2015-02-01 ENCOUNTER — Other Ambulatory Visit: Payer: Self-pay | Admitting: Oncology

## 2015-02-01 DIAGNOSIS — D860 Sarcoidosis of lung: Secondary | ICD-10-CM

## 2015-02-01 DIAGNOSIS — R918 Other nonspecific abnormal finding of lung field: Secondary | ICD-10-CM

## 2015-02-09 NOTE — Consult Note (Signed)
Chief Complaint:  Subjective/Chief Complaint Pt feels much better today. Min abd pain. Lipase back to normal. Drop in LFT. Liver serologies are neg so far. Breathing getting better as well.   VITAL SIGNS/ANCILLARY NOTES: **Vital Signs.:   31-Jul-15 07:39  Vital Signs Type Q 8hr  Temperature Temperature (F) 98.5  Celsius 36.9  Temperature Source oral  Pulse Pulse 89  Respirations Respirations 18  Systolic BP Systolic BP 290  Diastolic BP (mmHg) Diastolic BP (mmHg) 72  Mean BP 85  Pulse Ox % Pulse Ox % 97  Pulse Ox Activity Level  At rest  Oxygen Delivery Room Air/ 21 %   Brief Assessment:  GEN no acute distress   Cardiac Regular   Respiratory rhonchi   Gastrointestinal mild RLQ tenderness   Lab Results: Hepatic:  31-Jul-15 04:30   Bilirubin, Total 0.9  Alkaline Phosphatase  211 (46-116 NOTE: New Reference Range 05/08/14)  SGPT (ALT)  791 (14-63 NOTE: New Reference Range 05/08/14)  SGOT (AST)  330  Total Protein, Serum 6.4  Albumin, Serum  3.1  General Ref:  30-Jul-15 13:46   Acetaminophen, Serum < 2 (10-30 POTENTIALLY TOXIC:  > 200 mcg/mL  > 50 mcg/mL at 12 hr after  ingestion  > 300 mcg/mL at 4 hr after  ingestion)  Hepatitis A Virus IgM, IgG ========== TEST NAME ==========  ========= RESULTS =========  = REFERENCE RANGE =  HEPATITIS A IGM/IGG  Hepatitis A (Prof V) Hep A Ab, IgM                   [   Negative             ]          Negative Hep A Ab, Total                 [   Negative             ]          Negative               [facility]            No: [id number]           [address], [place], [place] [address]           Lindon Romp, MD         [phone]   Result(s) reported on 18 May 2014 at 08:20AM.  Hepatitis B Core Ab, IgG/IgM ========== TEST NAME ==========  ========= RESULTS =========  = REFERENCE RANGE =  Hepa.B Core Ab, IgG,IgM  HBV Core Ab, IgG/IgM Diff Hep B Core Ab, IgM              [   Negative             ]           Negative Hep B Core Ab, Tot              [  Negative             ]          Negative               [facility]            No: [id number]           [address], [place], [place] [address]           Darrick Penna  Evette Doffing, MD         [phone]   Result(s) reported on 18 May 2014 at 08:20AM.  Hepatitis B Surface Antibody, Qual ========== TEST NAME ==========  ========= RESULTS =========  = REFERENCE RANGE =  HEPATITIS B SURF.AB,QUAL  Hep B Surface Ab Hep B Surface Ab, Qual          [   Non Reactive         ]                                                Non Reactive: Inconsistent with immunity,                                            less than 10 mIU/mL                              Reactive:     Consistent with immunity,                                            greater than 9.9 mIU/mL               [facility]            No: [id number]           [address] [place], [place] [address]           Lindon Romp, MD         [phone]   Result(s) reported on 18 May 2014 at 08:20AM.  HBsAg ========== TEST NAME ==========  ========= RESULTS =========  = REFERENCE RANGE =  HEPATITIS B SURFACE AG  HBsAg Screen HBsAg Screen                    [   Negative             ]          Negative               [facility]            No: [id number]           [address], [place], [place] [address]           Lindon Romp, MD         [phone]   Result(s) reported on 18 May 2014 at 08:20AM.  Hepatitis C Virus Antibody ========== TEST NAME ==========  ========= RESULTS =========  = REFERENCE RANGE =  HCV ANTIBODY  HCV Antibody Hep C Virus Ab                  [   <0.1 s/co ratio      ]           0.0-0.9  Negative:     < 0.8                                             Indeterminate: 0.8 - 0.9                                                  Positive:     > 0.9                                                                       .                  In order to reduce the incidence of a false positive                  result, the CDC recommends that all s/co ratios                  between 1.0 and 10.9 be confirmed by a more specific                  supplemental or PCR testing. [facility] offers HCV Ab                 w/Reflex to Verification test [phone].               [facility]            No: [id number]           [address] [place], [place] [address]           Lindon Romp, MD         [phone]   Result(s) reportedon 18 May 2014 at 08:20AM.  Super Panel EBV Acute Ab ========== TEST NAME ==========  ========= RESULTS =========  = REFERENCE RANGE =  SUPER PANEL EBV ACUTE AB  EBV Acute Infection Antibodies EBV Ab VCA, IgM                 [   Result Pending       ]                   EBV Early Antigen Ab, IgG    [   Result Pending       ]                   EBV Ab VCA, IgG                 [   Result Pending       ]                   EBV Nuclear Antigen Ab, IgG     [   Result Pending       ]                   Interpretation:                 [  Final Report         ]                                                 EBV Interpretation Chart                                                                     .                Interpretation   EBV-IgM  EA(D)-IgG  VCA-IgG  EBNA-IgG                                                 .                EBV Seronegative    -        -         -          -                Early Phase         +        -         -          -                Acute Primary       +       +or-       + -                Infection                Convalescence/Past  -       +or-       +          +                Infection                Reactivated        +or-      +         +          +                Infection                       + AntibodyPresent      - Antibody Absent               [facility]            No: [id number]            [address], [place], [place] [address]           Lindon Romp, MD         [phone]   Result(s) reported on 18 May 2014 at 08:20AM.  CMV Antibodies, IgM Quant ==========  TEST NAME ==========  ========= RESULTS =========  = REFERENCE RANGE =  CMV ANTIBODIES, IGM QUAN  Cytomegalovirus (CMV) Ab, IgM Cytomegalovirus (CMV) Ab, IgM   [   Result Pending       ]                                 [facility]            No: [id number]           [address] [place], [place] [address]           Lindon Romp, MD         [phone]   Result(s) reported on 18 May 2014 at 08:20AM.  Routine Chem:  31-Jul-15 04:30   Lipase 109 (Result(s) reported on 18 May 2014 at 05:18AM.)  Glucose, Serum  137  BUN  5  Creatinine (comp) 0.81  Sodium, Serum 145  Potassium, Serum 3.9  Chloride, Serum  116  CO2, Serum 22  Calcium (Total), Serum  8.3  Osmolality (calc) 288  eGFR (African American) >60  eGFR (Non-African American) >60 (eGFR values <51mL/min/1.73 m2 may be an indication of chronic kidney disease (CKD). Calculated eGFR is useful in patients with stable renal function. The eGFR calculation will not be reliable in acutely ill patients when serum creatinine is changing rapidly. It is not useful in  patients on dialysis. The eGFR calculation may not be applicable to patients at the low and high extremes of body sizes, pregnant women, and vegetarians.)  Anion Gap 7  Cardiac:  30-Jul-15 13:46   CK, Total 54 (26-192 NOTE: NEW REFERENCE RANGE  11/20/2013)  Routine Coag:  30-Jul-15 13:46   Prothrombin 13.3  INR 1.0 (INR reference interval applies to patients on anticoagulant therapy. A single INR therapeutic range for coumarins is not optimal for all indications; however, the suggested range for most indications is 2.0 - 3.0. Exceptions to the INR Reference Range may include: Prosthetic heart valves, acute myocardial infarction, prevention of  myocardial infarction, and combinations of aspirin and anticoagulant. The need for a higher or lower target INR must be assessed individually. Reference: The Pharmacology and Management of the Vitamin K  antagonists: the seventh ACCP Conference on Antithrombotic and Thrombolytic Therapy. HQION.6295 Sept:126 (3suppl): [id number]. A HCT value >55% may artifactually increase the PT.  In one study,  the increase was an average of 25%. Reference:  "Effect on Routine and Special Coagulation Testing Values of Citrate Anticoagulant Adjustment in Patients with High HCT Values." American Journal of Clinical Pathology 2006;126:400-405.)  Activated PTT (APTT) 30.1 (A HCT value >55% may artifactually increase the APTT. In one study, the increase was an average of 19%. Reference: "Effect on Routine and Special Coagulation Testing Values of Citrate Anticoagulant Adjustment in Patients with High HCT Values." American Journal of Clinical Pathology 2006;126:400-405.)   Assessment/Plan:  Assessment/Plan:  Assessment LFT abnormalities. Improving. On Abx. Possibility of sepsis causing LFT elevation. More hepatocelluar rather than cholestatic process.   Plan For MRCP today. Continue Abx coverage. Dr. Vira Agar to cover this weekend. thanks.   Electronic Signatures: Verdie Shire (MD)  (Signed 31-Jul-15 09:04)  Authored: Chief Complaint, VITAL SIGNS/ANCILLARY NOTES, Brief Assessment, Lab Results, Assessment/Plan   Last Updated: 31-Jul-15 09:04 by Verdie Shire (MD)

## 2015-02-09 NOTE — Consult Note (Signed)
History of Present Illness:  Reason for Consult Abnormal CT scan of the chest Previous history of lymphadenopathy biopsy which was negative Abnormal liver enzymes Lower abdominal pain   HPI   HISTORY OF PRESENT ILLNESS: This is a pleasant 49 year old female who initially presented to the Emergency Department on July 29, yesterday with concerns of ongoing upper abdominal and flank pains. She has noticed these pains for about the past one month during which time she did have a CT scan of the abdomen and pelvis notable for kidney stones and some concerning findings in the left lower lobe of her lung.  She had a repeat CT scan just a couple of days ago of her chest and this did show multiple areas of lung consolidation that were somewhat masslike with some ongoing lymphadenopathy in that area. This is concerning for either neoplasm or atypical pneumonia.  on this CAT scan, there did show some common bile duct dilation of 1.6 cm with intrahepatic ductal dilation and the distal common bile duct was not visualized. When looking at her prior CT scan from last month, her pancreas was normal. She presents to the ER with concerns of ongoing abdominal pain for the past few weeks that was not improving. Upon further workup, she did have some transaminitis, mildly elevated bilirubin and an elevated lipase greater than 800. Triglycerides were slightly high at 271; otherwise, the lipid panel was normal.  Overnight, her transaminases markedly increased and both ALT and AST are greater than 1000. She has persistent upper abdominal pain. There is no nausea or vomiting. There are no changes to her bowel habits. There is no diarrhea, constipation, bright red blood per rectum or melena. No fever or chills. There is no chest pain or shortness of breath, but she was feeling slightly short of breath prior to her getting a nasal cannula. No unintentional weight changes. No recent alcohol intake. She does have some urinary symptoms  including frequency, urgency and dysuria. No hematuria. No blood in the stool. She is status post cholecystectomy.   PFSH:  Family History noncontributory   Social History negative alcohol, negative tobacco   Comments quit smoking several years ago . omnly smoked for 7 years   Additional Past Medical and Surgical History See from the previous. Note dictated in the hospital   Review of Systems:  General weakness  fatigue   Performance Status (ECOG) 1   HEENT no complaints   Lungs no complaints   Cardiac no complaints   GI nausea  vomiting   GU no complaints   Musculoskeletal back pain  muscle ache  joint pain   Extremities no complaints   Skin no complaints   Neuro no complaints   Endocrine no complaints   Psych anxiety   NURSING NOTES: ED Vital Sign Flow Sheet:   30-Jul-15 01:00   Pulse Pulse: 94   Respirations Respirations: 18   SBP SBP: 97   DBP DBP: 63   Pulse Ox % Pulse Ox %: 95   Pulse Ox Source Source: Room Air   Pain Scale (0-10) Pain Scale (0-10): Scale:8; pre-medication  NURSING NOTES: **Vital Signs.:   30-Jul-15 07:38   Vital Signs Type: Q 8hr   Temperature Temperature (F): 98   Celsius: 36.6   Temperature Source: oral   Pulse Pulse: 83   Respirations Respirations: 20   Systolic BP Systolic BP: 94   Diastolic BP (mmHg) Diastolic BP (mmHg): 63   Mean BP: 73   Pulse Ox % Pulse  Ox %: 93   Pulse Ox Activity Level: At rest   Oxygen Delivery: Room Air/ 21 %  *Intake and Output.:   Shift 30-Jul-15 15:00   Oral Intake: In: 0   IV (Primary): In: 884   Urine ml: Out: 950   Length of Stay Totals: Intake: 884, Output: 1300, Net: -416   Physical Exam:  General is alert oriented in mild distress   beacuse of pain   Lungs: iminished air entry on both sides   Cardiac: regular rate, rhythm   Abdomen: soft  tender   Skin: intact   Extremities: No edema, rash or cyanosis   Neuro: AAOx3  cranial nerves intact      Steroid injections to head for migraines:    Migraines: Vesticular   bipolar:    Depression:    Hernia Repair:    bladder tack:    Appendectomy:    Hysterectomy - Total:    C-Section:    Appendectomy:    Cholecystectomy:    Clindamycin: N/V  Vicodin: GI Distress    amitriptyline 50 mg tablet: 1 tab(s) orally once a day (at bedtime) x 30 days , Status: Active, Quantity: 30, Refills: None   Synthroid 150 mcg (0.15 mg) tablet: 1 tab(s) orally once a day x 30 days , Status: Active, Quantity: 30, Refills: None   multivitamin: Status: Active, Quantity: 0, Refills: None   fish oil: Status: Active, Quantity: 0, Refills: None   vitamin d: Status: Active, Quantity: 0, Refills: None   glimepiride 1 mg oral tablet: 1 tab(s) orally once a day, Status: Active, Quantity: 0, Refills: None   Janumet 1000 mg-50 mg oral tablet: 1 tab(s) orally 2 times a day, Status: Active, Quantity: 0, Refills: None   topiramate 200 mg oral tablet: 2 tab(s) orally 2 times a day, Status: Active, Quantity: 0, Refills: None   lamotrigine 25 mg oral tablet: 2 tab(s) orally AM and PM, Status: Active, Quantity: 0, Refills: None   mondafinil 200 mg: 2 tab(s) orally 2 times a day in AM and at lunch, Status: Active, Quantity: 0, Refills: None   Melatonin 10 mg : 1  orally once a day (at bedtime), Status: Active, Quantity: 0, Refills: None   baclofen 10 mg oral tablet: 1-3 tab(s) orally , As Needed, Status: Active, Quantity: 0, Refills: None   atorvastatin 10 mg oral tablet: 1 tab(s) orally once a day (at bedtime), Status: Active, Quantity: 0, Refills: None   levothyroxine 200 mcg:  1   1/2 tablets orally once a day, Status: Active, Quantity: 0, Refills: None   liothyronine 5 mcg oral tablet: 2 tab(s) orally once a day, Status: Active, Quantity: 0, Refills: None   Budeprion XL 300 mg : 1  orally in AM, Status: Active, Quantity: 0, Refills: None   vibryd 40 mg : 1  orally once a day, Status: Active,  Quantity: 0, Refills: None   alprazolam 1 mg oral tablet: 1 tab(s) orally 1-3x/day, As Needed, Status: Active, Quantity: 0, Refills: None   resperidone 2 mg : 1  orally once a day (at bedtime), Status: Active, Quantity: 0, Refills: None   saphris 10 mg : 2   once a day (at bedtime), Status: Active, Quantity: 0, Refills: None   multivitamin: 1  orally in AM, Status: Active, Quantity: 0, Refills: None   meclizine 25 mg : 1  orally , As Needed, Status: Active, Quantity: 0, Refills: None   prochlorperazine 10 mg oral tablet: 1 tab(s) orally 1-2 a  day, As Needed, Status: Active, Quantity: 0, Refills: None  Laboratory Results: Thyroid:  30-Jul-15 04:14   Thyroid Stimulating Hormone  0.05 (0.45-4.50 (International Unit)  ----------------------- Pregnant patients have  different reference  ranges for TSH:  - - - - - - - - - -  Pregnant, first trimetser:  0.36 - 2.50 uIU/mL)  Hepatic:  30-Jul-15 04:14   Bilirubin, Total  1.3  Alkaline Phosphatase  162 (46-116 NOTE: New Reference Range 05/08/14)  SGPT (ALT)  1299 (14-63 NOTE: New Reference Range 05/08/14)  SGOT (AST)  1355  Total Protein, Serum 6.5  Albumin, Serum  3.1  General Ref:  30-Jul-15 13:46   Acetaminophen, Serum < 2 (10-30 POTENTIALLY TOXIC:  > 200 mcg/mL  > 50 mcg/mL at 12 hr after  ingestion  > 300 mcg/mL at 4 hr after  ingestion)  Routine Chem:  30-Jul-15 04:14   Lipase  586 (Result(s) reported on 17 May 2014 at 05:15AM.)  Glucose, Serum  115  BUN 9  Creatinine (comp) 1.00  Sodium, Serum  146  Potassium, Serum 3.7  Chloride, Serum  114  CO2, Serum 24  Calcium (Total), Serum  8.1  Osmolality (calc) 290  eGFR (African American) >60  eGFR (Non-African American) >60 (eGFR values <24mL/min/1.73 m2 may be an indication of chronic kidney disease (CKD). Calculated eGFR is useful in patients with stable renal function. The eGFR calculation will not be reliable in acutely ill patients when serum creatinine is  changing rapidly. It is not useful in  patients on dialysis. The eGFR calculation may not be applicable to patients at the low and high extremes of body sizes, pregnant women, and vegetarians.)  Anion Gap 8  Hemoglobin A1c ([facility]) 6.2 (The American Diabetes Association recommends that a primary goal of therapy should be <7% and that physicians should reevaluate the treatment regimen in patients with HbA1c values consistently >8%.)  Cholesterol, Serum 157  Triglycerides, Serum  271  HDL (INHOUSE)  29  VLDL Cholesterol Calculated  54  LDL Cholesterol Calculated 74 (Result(s) reported on 17 May 2014 at 05:38AM.)  Cardiac:  30-Jul-15 13:46   CK, Total 54 (26-192 NOTE: NEW REFERENCE RANGE  11/20/2013)  Routine Coag:  30-Jul-15 13:46   Prothrombin 13.3  INR 1.0 (INR reference interval applies to patients on anticoagulant therapy. A single INR therapeutic range for coumarins is not optimal for all indications; however, the suggested range for most indications is 2.0 - 3.0. Exceptions to the INR Reference Range may include: Prosthetic heart valves, acute myocardial infarction, prevention of myocardial infarction, and combinations of aspirin and anticoagulant. The need for a higher or lower target INR must be assessed individually. Reference: The Pharmacology and Management of the Vitamin K  antagonists: the seventh ACCP Conference on Antithrombotic and Thrombolytic Therapy. ZWCHE.5277 Sept:126 (3suppl): [id number]. A HCT value >55% may artifactually increase the PT.  In one study,  the increase was an average of 25%. Reference:  "Effect on Routine and Special Coagulation Testing Values of Citrate Anticoagulant Adjustment in Patients with High HCT Values." American Journal of Clinical Pathology 2006;126:400-405.)  Activated PTT (APTT) 30.1 (A HCT value >55% may artifactually increase the APTT. In one study, the increase was an average of 19%. Reference: "Effect on Routine and Special  Coagulation Testing Values of Citrate Anticoagulant Adjustment in Patients with High HCT Values." American Journal of Clinical Pathology 2006;126:400-405.)  Routine Hem:  30-Jul-15 04:14   WBC (CBC) 3.7  RBC (CBC) 4.16  Hemoglobin (CBC) 12.6  Hematocrit (CBC)  37.9  Platelet Count (CBC) 175  MCV 91  MCH 30.3  MCHC 33.2  RDW 13.9  Neutrophil % 48.6  Lymphocyte % 34.3  Monocyte % 9.4  Eosinophil % 5.8  Basophil % 1.9  Neutrophil # 1.8  Lymphocyte # 1.3  Monocyte # 0.3  Eosinophil # 0.2  Basophil # 0.1 (Result(s) reported on 17 May 2014 at 05:10AM.)   Radiology Results: [place]:    30-Jul-15 12:55, US Abdomen General Survey  US Abdomen General Survey   REASON FOR EXAM:    abdominal pain, elevated LFT's  COMMENTS:       PROCEDURE: [place]  - US ABDOMEN GENERAL SURVEY  - May 17 2014 12:55PM     CLINICAL DATA:  Abdominal pain    EXAM:  ULTRASOUND ABDOMEN COMPLETE    COMPARISON:  None.    FINDINGS:  Gallbladder:  The patient is status postcholecystectomy    Common bile duct:    Diameter: 9.3 mm in diameter    Liver:    No focal lesion identified. Within normal limits in parenchymal  echogenicity.    IVC:    No abnormality visualized.  Pancreas:    Visualized portion unremarkable.    Spleen:    Size and appearance within normal limits.    Measures 8 cm in length    Right Kidney:    Length: 11.4 cm. Echogenicity within normal limits. No mass or  hydronephrosis visualized.  Left Kidney:    Length: 11.3cm. Echogenicity within normal limits. No mass or  hydronephrosis visualized.    Abdominal aorta:    No aneurysm visualized.  Measures up to 2.5 cm in diameter.    Other findings:    Suboptimal exam due to abundant bowel gas.     IMPRESSION:  1. Status postcholecystectomy.  2. No hydronephrosis.  Suboptimal exam due to abundant bowel gas.      Electronically Signed    By: Lahoma Crocker M.D.    On: 05/17/2014 13:06         Verified By: Ephraim Hamburger,  M.D.,  LabUnknown:    21-Jun-15 14:00, CT Abdomen Pelvis WO for [facility]  PACS Image     29-Jul-15 16:20, CT Chest With Contrast  PACS Image     30-Jul-15 12:55, US Abdomen General Survey  PACS Image   CT:    21-Jun-15 14:00, CT Abdomen Pelvis WO for [place]  CT Abdomen Pelvis WO for [facility]   REASON FOR EXAM:    lower abd pain, and hematuria  COMMENTS:       PROCEDURE: CT  - CT ABDOMEN /PELVIS WO (STONE)  - Apr 08 2014  2:00PM     CLINICAL DATA:  pt c/o pelvic pressure with left sided pain, burning  with urination, straining to urinate that started suddenly this  morning with nausea.Marland Kitchendenies hx of kidney stones. hx: hyst.,  appendectomy, cholecystectomy,c-section,bladder tack.    EXAM:  CT ABDOMEN AND PELVIS WITHOUT CONTRAST    TECHNIQUE:  Multidetector CT imaging of the abdomen and pelvis was performed  following the standard protocol without IV contrast.    COMPARISON:  CT and pelvis 06/30/2012    FINDINGS:  Consolidative area of increased density posterior left lower lobe.  Second area less well defined with linear components right lower  lobe.    Noncontrast evaluation liver is unremarkable. The status post  cholecystectomy. The spleen, adrenals, pancreas are unremarkable.    There is mild hydronephrosis and hydroureter on the left secondary  to a  4.5 mm distal ureteral calculus. The right kidney and  collecting system on the is unremarkable.  Scattered areas of diverticulosis throughout the bowel without  evidence of diverticulitis. Bowel otherwise negative. Moderate  amount of stool.    No abdominal aortic aneurysm. Within the limitations of a  noncontrast CT no evidence of abdominal or pelvic masses, free fluid  nor loculated fluid collections.    Patient status post left inguinal hernia repair. Otherwise no  evidence of abdominal wall or inguinal hernia.    There no aggressive appearing osseous lesions.     IMPRESSION:  Mild obstructive uropathy on the left  secondary to a distal 4.5 mm  ureteral calculus.    Consolidative density posterior left lower lobe. Differential  considerations include focal consolidated pneumonitis, rounded  atelectasis, a mass cannot be excluded. Surveillance evaluation  status post appropriate therapy regimen is recommended.    Increased density in the right lung base with a more linear  configuration differential considerations include atelectasis versus  scarring possibly infiltrate. Minimal areas of subpleural scarring  versus atelectasis within the lung bases.    Diverticulosis within the colon without evidence of diverticulitis.    Electronically Signed    By: Margaree Mackintosh M.D.    On: 04/08/2014 14:14         Verified By: Mikki Santee, M.D., MD    29-Jul-15 16:20, CT Chest With Contrast  CT Chest With Contrast   REASON FOR EXAM:    Metformin  abn CT of abd pelv showed lung mass  COMMENTS:       PROCEDURE: KCT - KCT CHEST WITH CONTRAST  - May 16 2014  4:20PM     CLINICAL DATA:  Lung mass    EXAM:  CT CHEST WITH CONTRAST    TECHNIQUE:  Multidetector CT imagingof the chest was performed during  intravenous contrast administration.    CONTRAST:  75 mL Isovue 300 nonionic  COMPARISON:  CT abdomen and pelvis including lung bases April 08, 2014    FINDINGS:  There is a persistent area of consolidation in the superior segment  of the left lower lobe measuring 3.6 x 3.5 cm. This area appears  somewhat masslike, with apparent surrounding inflammation. There is  also persistent consolidation in the lateral segment of the right  lower lobe. There is patchy opacity in the lateral left base  somewhat more inferiorly which is stable.    More superiorly, there is consolidation involving portions of the  superior segment right lower lobe and the lateral segment of the  right middle lobe. There is more patchy airspace consolidation in  the right upper lobe posterior segment at the level of the  superior  aortic arch. On axial slice 12 series 3, there is a nodular lesion  which is semi-solid in appearance measuring 7 x 7 mm. There are  patchy areas of subtle ground-glass opacity in both upper lobes,  more on the right than on the left. There is a nodular opacity  abutting the pleura in the inferior lingula measuring 5 x 3 mm.    There are multiple prominent lymph nodes. There is an enlarged lymph  node in the right peritracheal region measuring 2.3 x 1.6 cm. There  is an aortopulmonary window lymph node measuring 2.6 x 1.2 cm. There  is a hilar lymph node measuring 1.8 x 1.1 cm. A second hilar lymph  node measures 1.3 x 1.1 cm. There are several prominent lymph nodes  in the  left hilum, largest measuring 1.8 by 1.3 cm. There is sub-  carinal adenopathy measuring 2.3 x 1.8 cm.    The pericardium is not thickened.  In the visualized upper abdomen, the adrenals appear normal. There  is surgical absenceof the gallbladder. There is dilatation of the  common bile duct to 1.6 cm without mass or calculus seen in the  visualized portions of the common bile duct. There is also a degree  of intrahepatic biliary duct dilatation.    There are no blastic or lytic bone lesions.  Thyroid appears normal.     IMPRESSION:  Multiple areas of lung consolidation as well as patchy ground-glass  opacity in multiple areas. The area of consolidation in the superior  segment left lower lobe appear somewhat masslike as was noted on  recent prior CT of the abdomen which included lung bases. There is  also adenopathy at multiple sites. All of these findings potentially  could be due to atypical infectious pneumonia, neoplasm is  concerning given this overall appearance. Bronchoscopy may well be  advisable given the lung changes. Nuclear medicine PET study also  may be helpful to further assess given this combination of findings.    There is dilatation of the common bile duct as well as  intrahepatic  biliary ductdilatation. These findings may warrant either MRCP or  ERCP to further assess. Note that the distal common bile duct is not  seen on this chest CT examination.      Electronically Signed    By: Lowella Grip M.D.    On: 05/16/2014 17:15       Verified By: Leafy Kindle. WOODRUFF, M.D.,   Assessment and Plan: Impression:   Patient presents with abnormal liver enzymes.  Lower abdominal discomfort been appears to be starting from the back and radiating down in the front.  And had abnormal CT scan of chest with patchy infiltrates suggestive of pneumonia history of cannot lymph node left axillary lymph node was biopsied and was negative for malignancy Plan:   t this point in time we do MRI scan of lumbosacral spineagree with IV antibioticsbeing prolongedetiology of abnormal CT scan of chest is not clear but possibility of pneumonitis vs. pulmonary infection.abdominal pain which is significant presenting complain cannot be explained pulmonary findings is not clear up with antibiotics further bronchoscopy evaluation may be needed.   Electronic Signatures: Virgia Kelner, Martie Lee (MD)  (Signed 30-Jul-15 19:02)  Authored: HISTORY OF PRESENT ILLNESS, PFSH, ROS, NURSING NOTES, PE, PAST MEDICAL HISTORY, ALLERGIES, HOME MEDICATIONS, LABS, OTHER RESULTS, ASSESSMENT AND PLAN   Last Updated: 30-Jul-15 19:02 by Jobe Gibbon (MD)

## 2015-02-09 NOTE — H&P (Signed)
PATIENT NAME:  Jennifer Bright, Jennifer Bright MR#:  [id number] DATE OF BIRTH:  [birth year]  DATE OF ADMISSION:  05/16/2014  ADDENDUM  PHYSICAL EXAMINATION: VITAL SIGNS: Temperature 98 degrees Fahrenheit, pulse 94, respirations 18, blood pressure 97/63, pulse oximetry 95% on room air. GENERAL: Patient is alert and oriented x 3. She appears uncomfortable, but in no acute distress. HEENT: Normocephalic, atraumatic. Eyes are PERRLA. EOMI. Moist mucous membranes. Oropharynx without erythema or exudate. NECK: Trachea is midline. No adenopathy. CHEST: Symmetric and atraumatic. CARDIOVASCULAR: Regular rate and rhythm. Normal S1, S2. No rubs, clicks, or murmurs. LUNGS: Clear to auscultation bilaterally. Normal effort and excursion. ABDOMEN: Positive bowel sounds, soft. Diffusely tender, lower quadrants more tender bilaterally than the upper quadrants. The patient has voluntary guarding but no rebound tenderness. There is no hepatosplenomegaly. GENITOURINARY: Deferred. MUSCULOSKELETAL: The patient moves all 4 extremities equally. She has 5/5 strength in upper and lower extremities bilaterally. SKIN: No rashes or lesions. EXTREMITIES: No clubbing, cyanosis, or edema. NEUROLOGIC: Cranial nerves II-XII grossly intact. PSYCHIATRIC: Mood and affect are normal and congruent. LYMPH NODES: No palpable nodes. No palpable supraclavicular or cervical nodes.    ____________________________ Kelton PillarMichael S. Sheryle Hailiamond, MD msd:sk D: 05/17/2014 01:06:35 ET T: 05/17/2014 02:02:42 ET JOB#: [id number]  cc: Kelton PillarMichael S. Sheryle Hailiamond, MD, <Dictator> Kelton PillarMICHAEL S Skarlet Lyons MD ELECTRONICALLY SIGNED 05/17/2014 23:35

## 2015-02-09 NOTE — Discharge Summary (Signed)
PATIENT NAME:  Jennifer Bright, Jennifer Bright MR#:  [id number] DATE OF BIRTH:  10-30-1965  DATE OF ADMISSION:  05/16/2014 DATE OF DISCHARGE:  05/19/2014  ADMITTING DIAGNOSES:  1.  Elevated LFTs.   2.  Elevated lipase.   DISCHARGE DIAGNOSES:  1.  Acute hepatitis, possibly viral infection although viral panels were negative. Also could be medications or hypotension related. Her LFTs are trending down and much improved. The patient is asymptomatic. Her MRCP revealed no evidence of duct dilation or stricture or evidence of any stone. Status post GI evaluation.  2.  Lung masses noted on a CT scan. . The patient will be seen by Dr. Doylene Canninghoksi again to recheck a CT scan after antibiotic treatment.  3.  Kidney stones without any pain.  4.  Anxiety and depression.  5.  Hypothyroidism with a decrease in TSH. Her Synthroid dose was decreased.  6.  Gastroesophageal reflux disease.  7.  Diabetes type 2.  8.  History of vestibular migraines.  9.  Depression.  10. Status post appendectomy, status post cholecystectomy, total abdominal hysterectomy, status post cesarean section, status post hernia repair, status post bladder tacking.   CONSULTANTS DURING HOSPITALIZATION: Ezzard Standingaul Y. Bluford Kaufmannh, MD; Gerome SamJanak K. Choksi, MD  PERTINENT LABORATORY DATA AND EVALUATIONS: Admitting glucose 118, BUN 11, creatinine 0.94, sodium 141, potassium 4.0, chloride 110, CO2 of 24, calcium was 8.9. Lipase was   Most recent lipase on 07/31 was 109. LFTs: Total protein 6.5, albumin 3.1, bilirubin total 1.3, alkaline phosphatase 162, AST 1355, ALT 1299. On the day of discharge, AST was 104, ALT was 524 on 08/01. TSH was 0.05. Acetaminophen level less than 2. Hepatitis B surface antibody nonreactive. CMV, IgM less than 30. ANA was negative. Hepatitis B surface antigen screen was negative. Hepatitis A virus IgM was negative. Hepatitis B core antibody was negative. Hepatitis C virus ratio was less than 0.1. EBV antibody to VCA IgA was elevated.   CT scan of the  chest with contrast showed multiple areas of lung consolidation as well as patchy ground-glass opacities in multiple areas. Areas of consolidation in the superior segment of the left lobe appear somewhat mass like and not noted on a recent CT of the abdomen which included the base. There is also adenopathy at multiple sites.   MRI of the lumbar spine shows a negative MRI of the lumbar spine.   MRCP shows status post cholecystectomy. The common bile duct measures 7 mm and smoothly tapers at the ampulla, likely postsurgical. No cholelithiasis.   Ultrasound of the abdomen shows status post cholecystectomy. No hydronephrosis. Suboptimal exam.   HOSPITAL COURSE: Please refer to the H and P done by the admitting physician. The patient is a 49 year old white female who presented to the ED with unremitting abdominal pain and flank pain. It has been going on for the past 3 weeks. The patient was evaluated and noted to have elevated LFTs in the ED. She also had a CT scan of the chest. Further, she was noted to have an elevated lipase as well as elevated LFTs. It was unclear whether she had acute pancreatitis or not. However, she did have abnormal LFTs. The patient was initially kept n.p.o. with pain control and given IV hydration. She was seen by GI. She was restarted on her diet. She underwent a workup for significantly elevated. LFTs. The cause of that is still unclear, but differentials included hypotension, possible viral infection, or medications. The patient's LFTs are trending downwards. She is doing much better and  was followed by GI. The patient also had been noticed to have multiple lung abnormalities. She has been seen by Dr. Doylene Canning who saw the patient. He recommended possible antibiotics and an outpatient follow-up. At this time, the patient is doing better and is stable for discharge, but she will need outpatient oncology followup. She may also need pulmonary followup.   DISCHARGE MEDICATIONS:  Amitriptyline 50 one tablet at bedtime, fish oil daily,   200 two tablets b.i.d., lamotrigine 25 two tablets b.i.d., modafinil 200 mg 2 tablets b.i.d., baclofen 10 mg 1 tablet p.o. t.i.d. as needed, liothyronine 5 mcg 2 tablets daily, bupropion XL 300 daily, Viibryd 40 mg daily, alprazolam 1 mg t.i.d. as needed, risperidone 2 mg at bedtime, multivitamin daily, meclizine 25 daily as needed, prochlorperazine 10 mg 1 to 2 tablets as needed, glimepiride 1 mg daily, Janumet 100/50 one tablet p.o. b.i.d., levothyroxine 112 mcg daily, azithromycin 500 mg 1 tablet p.o. daily x 5 days.   DIET: Low fat, low sodium, low cholesterol.   ACTIVITY: As tolerated.   FOLLOWUP: With primary MD in 3-4 days to have LFTs checked. Follow with Dr. Bluford Kaufmann in 2-4 weeks and Dr. Doylene Canning in 2-4 weeks.   TIME SPENT: 35 minutes on this patient.     ____________________________ Lacie Scotts. Allena Katz, MD shp:jb D: 05/20/2014 08:44:23 ET T: 05/20/2014 09:53:55 ET JOB#: [id number]  cc: Wilian Kwong H. Allena Katz, MD, <Dictator> Charise Carwin MD ELECTRONICALLY SIGNED 05/30/2014 10:26

## 2015-02-09 NOTE — H&P (Signed)
PATIENT NAME:  Jennifer Bright, Jennifer Bright MR#:  [id number] DATE OF BIRTH:  1966/08/23  DATE OF ADMISSION:  05/16/2014  EMERGENCY DEPARTMENT PHYSICIAN: Eartha Inch. York Cerise, MD  PRIMARY CARE PHYSICIAN: Teena Irani. Terance Hart, MD  ADMIT DIAGNOSES: 1.  Acute pancreatitis.  2.  Lung masses. 3.  Kidney stones.  HISTORY OF PRESENT ILLNESS: This is a 49 year old Caucasian female who presented to the emergency department after complaining of unremitting abdominal and flank pain. The pain has been intermittent for the past 3 weeks. She had undergone a CT of the abdomen and pelvis 3 weeks ago, where she was found to have some obstructive uropathy consistent with kidney stones. That scan revealed what appeared to be a lung mass in the left lower lobe, and so her primary care doctor scheduled a CT scan of her chest today. After obtaining this study, the patient came to the Emergency Department to be evaluated for her abdominal and flank pain. She states that the pain is sometimes unbearable, but ranges from 5/10 in severity all the way up to 10 at its most severe. She has had some Dilaudid by the time of this interview and her pain has decreased to 3/10 in severity. She admits to vomiting several times today. Emesis was orange in color. She has not had anything to eat. There was no green or bilious appearance to the emesis. She also admits to difficulty urinating. She has dysuria, frequency, urgency, and hesitancy. She has gone to the restroom at least 10 times today to urinate. She had 1 bowel movement today which was normal. She denies seeing blood in her urine or stool throughout the period of time she has had abdominal pain. The pain is in the lower quadrants, mostly frontal and pubic but sometimes radiates to her flanks; left flank pain greater than right. She also feels like she is having to dribble urine all the time.  REVIEW OF SYSTEMS: CONSTITUTIONAL: Patient denies fever, weight loss, or night sweats. EYES: Denies  blurred or double vision, inflammation, or decreased visual acuity. ENT: Patient denies sore throat or difficulty hearing. RESPIRATORY: Patient denies cough or shortness of breath.  CARDIOVASCULAR: Denies chest pain or palpitations. GASTROINTESTINAL: The patient complains of nausea as well as flank pain as described above. GENITOURINARY: The patient admits to dysuria but denies hematuria. ENDOCRINOLOGY: The patient denies polyuria or nocturia. She has thyroid problems and takes medicine for this. HEMATOLOGICAL: The patient denies any bleeding diathesis or swollen glands. INTEGUMENTARY: The patient denies rashes or lesions. MUSCULOSKELETAL: The patient admits to cramping that is like the pain one has in labor, she says. She denies any myalgias or arthralgias. NEUROLOGIC: She denies trouble swallowing or speaking. She admits to chronic migraine headaches. PSYCHOLOGICAL: The patient admits to depression but denies suicidal ideation or homicidal ideation.  PAST MEDICAL HISTORY: 1.  Gastroesophageal reflux.  2.  Diabetes mellitus type 2.  3.  Vestibular migraines. 4.  Depression. 5.  Hypothyroidism.  PAST SURGICAL HISTORY: 1.  Appendectomy. 2.  Cholecystectomy. 3.  Total abdominal hysterectomy. 4.  C-section. 5.  Hernia repair. 6.  Bladder tacking.  FAMILY HISTORY: Father is deceased of colon cancer, mother of Barrett esophagus.  SOCIAL HISTORY: The patient lives with her husband. She has 4 kids, all of whom are in good health. She denies alcohol or drug use. She is a former 6-pack-year smoker.  HOME MEDICATIONS: 1.  Vitamin D, unknown quantity at this time. 2.  Viibryd 40 mg 1 tab p.o. daily. 3.  Topamax 200  mg 2 tabs p.o. b.i.d. 4.  Synthroid 150 mcg 1 tab p.o. daily. 5.  Saphris 10 mg 2 tabs p.o. at bedtime.  6.  Risperidone 2 mg 1 tab p.o. at bedtime. 7.  Prochlorperazine 10 mg 1 tab p.o. once or twice a day as needed for nausea. 8.  Multivitamin 1 tab p.o. every morning. 9.   Modafinil 200 mg 2 tabs p.o. b.i.d. every morning and at lunch. 10.  Melatonin 10 mg 1 tab p.o. daily at bedtime. 11.  Meclizine 25 mg 1 tab p.o. daily as needed. 12.  Liothyronine 5 mcg 2 tabs p.o. once daily. 13.  Levothyroxine 200 mcg 1-1/2 tablets p.o. daily. 14.  Lamotrigine 25 mg 2 tabs p.o. b.i.d. 15.  Janumet 1000 mg/50 mg tablet, 1 tab p.o. b.i.d. 16.  Glimepiride 1 mg 1 tab p.o. daily, 17.  Fish oil. 18.  Bupropion XL 300 mg 1 tablet p.o. every morning. 19.  Baclofen 10 mg 1-3 tabs p.o. daily as needed. 20.  Atorvastatin 10 mg 1 tab p.o. daily at bedtime. 21.  Amitriptyline 50 mg 1 tab p.o. daily at bedtime. 22.  Alprazolam 1 mg 1 tab p.o. 1-3 times a day as needed for anxiety.  ALLERGIES: CLINDAMYCIN, VICODIN.   PERTINENT LABORATORY AND RADIOLOGIC FINDINGS: CT of the chest with contrast shows multiple areas of lung consolidation as well as ground-glass patchy opacities, particularly in the upper right lobes as well as in the left lower lobe. The latter mass may have a solid appearance that could indicate a malignant mass. There is dilatation of the common bile duct as well as intrahepatic biliary ducts. Labs: ALT 114, AST 163, lipase 888. Urinalysis: 15-30 white blood cells per high-power field, nitrite negative, 1+ leukocyte esterase, specific gravity more than 1.050.   ASSESSMENT AND PLAN: This is a 49 year old Caucasian female with pancreatitis, possible lung masses, and a urinary tract infection. The patient's CT scan is concerning for malignancy. Her pancreatitis may be due to a primary pancreatic mass causing blockage of the common bile duct.  1.  Pancreatitis. Patient will be made n.p.o. for now. We have placed her on D5-half normal saline with 20 mEq of potassium for hydration and glycemic control. Gastroenterology consult is in place at this time to comment on possible endoscopic retrograde cholangiopancreatography or magnetic resonance cholangiopancreatography and biopsy of  the bile duct obstruction. Our goal will be to manage the patient's pain at this time. She has received some p.r.n. doses of Dilaudid in the Emergency Department. I will place her on a patient-controlled pain pump on the floor. 2.  Lung masses. The patient does have some right upper lobe masses that may be amenable to biopsy. It is unclear at this time if they are solid. The patient does not have any hypoxia or dyspnea. The other lung masses appear to be in the lower lobes and may be more difficult to reach by a bronchoscopy. Thus at this time, the more pressing concern or easier diagnosis to make could come from her pancreas. As mentioned above, GI will assess for the best intervention at this time to come to a diagnosis. If indeed her lung infiltrates represent pneumonia, she has been covered at this time with ceftriaxone for community-acquired pneumonia. However, it remains to be seen if this does indeed become a pneumonia. 3.  Urinary tract infection. There is no leukocytosis, tachycardia, or fever. She does not meet criteria for sepsis. We will give ceftriaxone q. 24 for 3 days to cover  her urinary tract infection. Urine cultures have been obtained and antibiotic coverage will change per sensitivities. 4.  Lung infiltrate. At this time, the patient did not clinically have a pneumonia. As stated above, we will determine if bronchoscopy is the next critical step. At this time it may not be. 5.  Diabetes type 2, reportedly well controlled, as evidenced by the patient's last A1c, which she reports as 7. I have ordered a repeat A1c for the morning. She will be placed on an insulin sliding scale for glycemic control while in the hospital. We will not resume her oral glycemic agents at this time due to pancreatitis. She may not have her glimepiride or Janumet for at least 2 days following her CT scan with contrast.  6.  Gastrointestinal prophylaxis. At this time, we will hold any oral medicines. As the patient's  pancreatitis appears to resolve, we will start a PPI. 7.  Deep venous thrombosis prophylaxis. Heparin and sequential compression devices for now. 8.  The patient is a full code.   TIME SPENT: With admission orders and patient care, approximately 1 hour.    ____________________________ Issaac Shipper S. Sheryle Hailiamond, MD msd:sk D: 07/30/2015Kelton Pillar 00:50:00 ET T: 05/17/2014 01:13:29 ET JOB#: [id number]  cc: Kelton PillarMichael S. Sheryle Hailiamond, MD, <Dictator> Kelton PillarMICHAEL S Adolf Ormiston MD ELECTRONICALLY SIGNED 05/17/2014 23:34

## 2015-02-09 NOTE — Consult Note (Signed)
PATIENT NAME:  Jennifer Bright, Jennifer Bright MR#:  [id number] DATE OF BIRTH:  02/12/1966  DATE OF COJuan QuamSULTATION:  05/17/2014  REFERRING PHYSICIAN:  Kelton PillarMichael S. Sheryle Hailiamond, MD.  CONSULTING PHYSICIAN:  Hardie ShackletonKaryn M. Colin BentonEarle, PA-C  ATTENDING GASTROENTEROLOGIST: Ezzard StandingPaul Y. Oh, MD.  REASON FOR CONSULTATION: Possible pancreatitis with a dilated common bile duct.   HISTORY OF PRESENT ILLNESS: This is a pleasant 49 year old female who initially presented to the Emergency Department on July 29, yesterday with concerns of ongoing upper abdominal and flank pains. She has noticed these pains for about the past one month during which time she did have a CT scan of the abdomen and pelvis notable for kidney stones and some concerning findings in the left lower lobe of her lung.  She had a repeat CT scan just a couple of days ago of her chest and this did show multiple areas of lung consolidation that were somewhat masslike with some ongoing lymphadenopathy in that area. This is concerning for either neoplasm or atypical pneumonia.   Additionally, on this CAT scan, there did show some common bile duct dilation of 1.6 cm with intrahepatic ductal dilation and the distal common bile duct was not visualized. When looking at her prior CT scan from last month, her pancreas was normal. She presents to the ER with concerns of ongoing abdominal pain for the past few weeks that was not improving. Upon further workup, she did have some transaminitis, mildly elevated bilirubin and an elevated lipase greater than 800. Triglycerides were slightly high at 271; otherwise, the lipid panel was normal.  Overnight, her transaminases markedly increased and both ALT and AST are greater than 1000. She has persistent upper abdominal pain. There is no nausea or vomiting. There are no changes to her bowel habits. There is no diarrhea, constipation, bright red blood per rectum or melena. No fever or chills. There is no chest pain or shortness of breath, but she was  feeling slightly short of breath prior to her getting a nasal cannula. No unintentional weight changes. No recent alcohol intake. She does have some urinary symptoms including frequency, urgency and dysuria. No hematuria. No blood in the stool. She is status post cholecystectomy.   PAST MEDICAL HISTORY: Migraines, depression, hypothyroidism, kidney stones, GERD, diabetes mellitus type 2, obesity.   PAST SURGICAL HISTORY: Cholecystectomy, appendectomy, abdominal hysterectomy, C-section, hernia repair, bladder tacking.   SOCIAL HABITS: The patient denies any alcohol or illicit drug use. She has a remote history of tobacco use, but denies any current tobacco use. No new medications recently.   HOME MEDICATIONS: Vitamin D, Viibryd, Topamax, Synthroid, Saphris, risperidone, multivitamin, melatonin, modafinil, prochlorperazine, amitriptyline, alprazolam, baclofen, atorvastatin, bupropion, Janumet, glimepiride, fish oil, lamotrigine, levothyroxine, meclizine, and liothyronine.   ALLERGIES: VICODIN AND CLINDAMYCIN.   PHYSICAL EXAMINATION: VITAL SIGNS: Blood pressure 94/63, heart rate 83, respirations 20, temperature 98.0. Bedside  pulse oximetry is 93%.  The patient actually currently has a nasal cannula in place.  GENERAL: This is a pleasant 49 year old female who does appear to be in obvious discomfort. Alert and oriented x3.  HEAD: Atraumatic, normocephalic.  NECK: Supple. No lymphadenopathy noted.  HEENT: Sclerae anicteric. Mucous membranes moist. Nasal cannula in place.  PULMONARY: Respirations are even and unlabored. Clear to auscultation bilateral anterior lung fields.  CARDIAC: Regular rate and rhythm. S1, S2 noted.  ABDOMEN: Soft, nondistended. Positive tenderness to palpation is noted in bilateral upper quadrants in the epigastric region. No guarding or rebound. No masses, hernias or organomegaly appreciated. Normoactive bowel sounds noted  in all 4 quadrants. Exam is somewhat limited secondary  to obese habitus.  RECTAL: Deferred.  EXTREMITIES: Negative for lower extremity edema, 2+ pulses noted in bilateral upper extremities.  PSYCHIATRIC: Appropriate mood and affect.  NEUROLOGIC: Cranial nerves II through XII are grossly intact.   LABORATORY DATA: White blood cells 3.7, hemoglobin 12.6, hematocrit 37.9, platelets 175. MCV 91.  Lipid panel is okay outside of some mildly elevated triglycerides of 271. Sodium 146, potassium 3.7, BUN 9, creatinine 1.00, glucose 115, total bilirubin 1.3, alkaline phosphatase 162, ALT 1299, AST 1355, albumin 3.1. Lipase is 586, down from 888.   IMAGING: A CT scan of the abdomen and pelvis was obtained back in 04/08/2014 revealing a normal pancreas.   CT scan obtained just a few days ago, reveals common bile duct dilation of 1.6 cm with intrahepatic ductal dilation as well. The distal common bile duct was not visualized. Also, some abnormal chest CT findings include multiple areas of lung consolidation that were somewhat masslike with accompanying lymphadenopathy. This was concerning for either a neoplasm versus an atypical pneumonia.   Ultrasound of the abdomen was obtained by the patient showing a common bile duct measuring 9.3 mm, normal-appearing liver with no obvious mass or abnormal contour. The visualized portion of the pancreas was normal, but limited secondary to bowel gas.   ASSESSMENT: 1.  Upper abdominal pain and flank pain.  2.  Kidney stones.  3.  Abnormal imaging revealing biliary ductal dilation.  4.  Marked transaminitis.  5.  Elevated lipase concerning for possible pancreatitis.   PLAN: I have discussed this patient's case in detail with Dr. Lutricia Feil, who is involved in the development of the patient's plan of care. The patient did have marked increase of her transaminases overnight and now, ALT and AST are both greater than 1000. Therefore, we do agree with checking an acute viral hepatitis panel. These were submitted and are currently  pending. We have reviewed both the recent CAT scan and ultrasound and there are some concerns with discrepancy between the common bile duct size between these 2 exams.   Certainly, with an elevated lipase, ongoing upper abdominal pain and the biliary ductal dilation on imaging, we do feel an appropriate next step would be to proceed with a MRCP. This imaging study was outlined in detail for the patient and she verbalized understanding and is in agreement to proceed forward with this. It is not typical for biliary obstruction to cause this marked transaminitis. Therefore, we are concerned that there could be something else going on. Further recommendations regarding the pulmonary findings per internal medicine and pulmonology. Continue symptomatic management with pain control as needed. Continue Protonix. We will continue to follow this patient and make further recommendations per clinical course. Of note, we did review her prior CAT scan from back in June and there was a normal pancreas at that time.   Thank you so much for this consultation and for allowing [place] to participate in the patient's plan of care.   ____________________________ Hardie Shackleton. Samarra Ridgely, PA-C kme:ds D: 05/17/2014 14:32:51 ET T: 05/17/2014 15:21:11 ET JOB#: [id number]  cc: Hardie Shackleton. Christopherjohn Schiele, PA-C, <Dictator> Hardie Shackleton Skiler Olden PA ELECTRONICALLY SIGNED 05/17/2014 16:17

## 2015-02-09 NOTE — Consult Note (Signed)
Pt seen and examined. Please see Tobe Sos notes. Chronic abd pain x 10month. Initial rise in lipase but CT of pancrease on previous CT was normal. CBD 30mm on U/S, which is wnl of someone with her GB surgery and age. Sudden rise in AST/ALT out of proportion to T.bili/alk phos. Biliary disease DOES NOT cause AST/ALT to go up this high. Need to r/o other causes, such hypoxia/ischemia, toxins, acute hepatitis, meds, etc. Will order liver serologies, Continue to moniter LFT. Will order MRCP to evaluate pancrease, liver, and CBD. Will follow. ERCP will be considered only if MRCP abnormal and pt's breathing stabilizes. Thanks.   Electronic Signatures: Verdie Shire (MD) (Signed on 30-Jul-15 15:02)  Authored   Last Updated: 30-Jul-15 15:23 by Verdie Shire (MD)

## 2015-02-09 NOTE — Consult Note (Signed)
patient  has been evaluated today.  Feeling better.  Lower abdominal pain has improved.has swallowing problems.  Abnormal CT scan of the chest on IV antibiotics therapypelvic pain MRI scan of lumbosacral spine and pelvis pendingliver enzymes MRCPspendinghistory of enlarged lymph node biopsy of the axillary lymph node was benign. is stable.  Patient's was improvement and improvement in the pain.  Lab data has been reviewed.   x-ray data other pending.discuss situation with patient as well as Dr. Hilton SinclairWeiting   Regarding abnormal CT scan of the chest would continue IV antibiotics followed by by mouth antibiotics.  And I would evaluate patient as outpatient with repeat CT scan today is completely clear and if he does not get completely cleared than possibility of PET scan and bronchoscopy would be considered.  Patient is agreeable to itfollowup on MRCP as well as MRI scan of lumbosacral spinepatient is discharged I would like to see her as outpatient in 2 weeks  Electronic Signatures: Laddie Aquashoksi, Skylor Hughson K (MD)  (Signed on 31-Jul-15 10:22)  Authored  Last Updated: 31-Jul-15 10:22 by Laddie Aquashoksi, Jaline Pincock K (MD)

## 2015-04-15 ENCOUNTER — Ambulatory Visit
Admission: RE | Admit: 2015-04-15 | Discharge: 2015-04-15 | Disposition: A | Discharge status: Home / Self Care | Payer: BLUE CROSS/BLUE SHIELD | Source: Ambulatory Visit | Attending: Oncology | Admitting: Oncology

## 2015-04-17 ENCOUNTER — Ambulatory Visit: Payer: Self-pay | Admitting: Oncology

## 2015-04-17 ENCOUNTER — Other Ambulatory Visit: Payer: Self-pay

## 2015-04-19 ENCOUNTER — Other Ambulatory Visit: Payer: Self-pay | Admitting: *Deleted

## 2015-04-19 DIAGNOSIS — D869 Sarcoidosis, unspecified: Secondary | ICD-10-CM

## 2015-04-23 ENCOUNTER — Ambulatory Visit
Admission: RE | Admit: 2015-04-23 | Discharge: 2015-04-23 | Disposition: A | Discharge status: Home / Self Care | Payer: BLUE CROSS/BLUE SHIELD | Source: Ambulatory Visit | Attending: Oncology | Admitting: Oncology

## 2015-04-23 ENCOUNTER — Ambulatory Visit: Payer: BLUE CROSS/BLUE SHIELD

## 2015-04-23 DIAGNOSIS — D860 Sarcoidosis of lung: Secondary | ICD-10-CM

## 2015-04-23 DIAGNOSIS — D869 Sarcoidosis, unspecified: Secondary | ICD-10-CM | POA: Diagnosis not present

## 2015-04-23 DIAGNOSIS — R918 Other nonspecific abnormal finding of lung field: Secondary | ICD-10-CM

## 2015-04-23 DIAGNOSIS — R590 Localized enlarged lymph nodes: Secondary | ICD-10-CM | POA: Diagnosis not present

## 2015-04-25 ENCOUNTER — Inpatient Hospital Stay (HOSPITAL_BASED_OUTPATIENT_CLINIC_OR_DEPARTMENT_OTHER): Payer: BLUE CROSS/BLUE SHIELD | Admitting: Oncology

## 2015-04-25 ENCOUNTER — Inpatient Hospital Stay: Payer: BLUE CROSS/BLUE SHIELD | Attending: Oncology

## 2015-04-25 VITALS — BP 125/89 | HR 101 | Temp 97.7°F | Wt 203.9 lb

## 2015-04-25 DIAGNOSIS — R590 Localized enlarged lymph nodes: Secondary | ICD-10-CM | POA: Diagnosis not present

## 2015-04-25 DIAGNOSIS — E669 Obesity, unspecified: Secondary | ICD-10-CM

## 2015-04-25 DIAGNOSIS — E785 Hyperlipidemia, unspecified: Secondary | ICD-10-CM | POA: Diagnosis not present

## 2015-04-25 DIAGNOSIS — D869 Sarcoidosis, unspecified: Secondary | ICD-10-CM

## 2015-04-25 DIAGNOSIS — D860 Sarcoidosis of lung: Secondary | ICD-10-CM

## 2015-04-25 DIAGNOSIS — E039 Hypothyroidism, unspecified: Secondary | ICD-10-CM | POA: Insufficient documentation

## 2015-04-25 DIAGNOSIS — Z79899 Other long term (current) drug therapy: Secondary | ICD-10-CM

## 2015-04-25 DIAGNOSIS — E119 Type 2 diabetes mellitus without complications: Secondary | ICD-10-CM

## 2015-04-25 DIAGNOSIS — Z87891 Personal history of nicotine dependence: Secondary | ICD-10-CM | POA: Insufficient documentation

## 2015-04-25 LAB — CBC WITH DIFFERENTIAL/PLATELET
BASOS ABS: 0.1 10*3/uL (ref 0–0.1)
BASOS PCT: 1 %
EOS ABS: 0.7 10*3/uL (ref 0–0.7)
Eosinophils Relative: 10 %
HEMATOCRIT: 42 % (ref 35.0–47.0)
HEMOGLOBIN: 13.9 g/dL (ref 12.0–16.0)
Lymphocytes Relative: 26 %
Lymphs Abs: 1.8 10*3/uL (ref 1.0–3.6)
MCH: 30.4 pg (ref 26.0–34.0)
MCHC: 33.2 g/dL (ref 32.0–36.0)
MCV: 91.6 fL (ref 80.0–100.0)
MONO ABS: 0.5 10*3/uL (ref 0.2–0.9)
Monocytes Relative: 7 %
NEUTROS ABS: 4 10*3/uL (ref 1.4–6.5)
Neutrophils Relative %: 56 %
Platelets: 213 10*3/uL (ref 150–440)
RBC: 4.59 MIL/uL (ref 3.80–5.20)
RDW: 13.9 % (ref 11.5–14.5)
WBC: 7.1 10*3/uL (ref 3.6–11.0)

## 2015-04-25 LAB — COMPREHENSIVE METABOLIC PANEL
ALT: 39 U/L (ref 14–54)
ANION GAP: 6 (ref 5–15)
AST: 25 U/L (ref 15–41)
Albumin: 4.2 g/dL (ref 3.5–5.0)
Alkaline Phosphatase: 94 U/L (ref 38–126)
BUN: 9 mg/dL (ref 6–20)
CALCIUM: 8.9 mg/dL (ref 8.9–10.3)
CO2: 23 mmol/L (ref 22–32)
Chloride: 106 mmol/L (ref 101–111)
Creatinine, Ser: 0.89 mg/dL (ref 0.44–1.00)
GFR calc Af Amer: >60 mL/min (ref >60)
GFR calc non Af Amer: >60 mL/min (ref >60)
GLUCOSE: 223 mg/dL — AB (ref 65–99)
Potassium: 4.1 mmol/L (ref 3.5–5.1)
SODIUM: 135 mmol/L (ref 135–145)
Total Bilirubin: 0.4 mg/dL (ref 0.3–1.2)
Total Protein: 7.2 g/dL (ref 6.5–8.1)

## 2015-04-25 NOTE — Progress Notes (Signed)
Patient does not have living will.  Information given.  Former smoker. 

## 2015-05-12 ENCOUNTER — Encounter: Payer: Self-pay | Admitting: Oncology

## 2015-05-12 DIAGNOSIS — G4730 Sleep apnea, unspecified: Secondary | ICD-10-CM

## 2015-05-12 DIAGNOSIS — E119 Type 2 diabetes mellitus without complications: Secondary | ICD-10-CM | POA: Insufficient documentation

## 2015-05-12 DIAGNOSIS — K219 Gastro-esophageal reflux disease without esophagitis: Secondary | ICD-10-CM | POA: Insufficient documentation

## 2015-05-12 DIAGNOSIS — F319 Bipolar disorder, unspecified: Secondary | ICD-10-CM

## 2015-05-12 DIAGNOSIS — G250 Essential tremor: Secondary | ICD-10-CM | POA: Insufficient documentation

## 2015-05-12 DIAGNOSIS — F419 Anxiety disorder, unspecified: Secondary | ICD-10-CM | POA: Insufficient documentation

## 2015-05-12 DIAGNOSIS — E039 Hypothyroidism, unspecified: Secondary | ICD-10-CM | POA: Insufficient documentation

## 2015-05-12 DIAGNOSIS — G2581 Restless legs syndrome: Secondary | ICD-10-CM | POA: Insufficient documentation

## 2015-05-12 DIAGNOSIS — K2100 Gastro-esophageal reflux disease with esophagitis, without bleeding: Secondary | ICD-10-CM | POA: Insufficient documentation

## 2015-05-12 DIAGNOSIS — I10 Essential (primary) hypertension: Secondary | ICD-10-CM | POA: Insufficient documentation

## 2015-05-12 DIAGNOSIS — G43809 Other migraine, not intractable, without status migrainosus: Secondary | ICD-10-CM | POA: Insufficient documentation

## 2015-05-12 DIAGNOSIS — R51 Headache: Secondary | ICD-10-CM

## 2015-05-12 DIAGNOSIS — G4710 Hypersomnia, unspecified: Secondary | ICD-10-CM | POA: Insufficient documentation

## 2015-05-12 HISTORY — DX: Bipolar disorder, unspecified: F31.9

## 2015-05-12 NOTE — Progress Notes (Signed)
[place] @ [facility] Telephone:(336) [phone]  Fax:(336) [place] [facility]: 1966-07-07  MR#: [id number]  POL#:[id number]  Patient Care Team: Juluis Pitch, MD as PCP - General (Family Medicine)  CHIEF COMPLAINT:  Chief Complaint  Patient presents with  . Follow-up     No history exists.  Persistent abnormal CT scan with multiple mediastinal lymph node and mass like consolidation in the left upper lobe (August, 2015) Biopsy of the left axillary lymph node several years ago was inconclusive and nondiagnostic Bronchoscopy is suggestive off granulomatous inflammation (possibility of sarcoidosis being considered) AFB and fungal stains are negative (September of 2015  No flowsheet data found.  INTERVAL HISTORY: 49 year old lady with history of sarcoidosis came today further follow-up.  No cough no shortness of breath chills or fever.  Patient had a repeat CT scan of the chest REVIEW OF SYSTEMS:   GENERAL:  Feels good.  Active.  No fevers, sweats or weight loss. PERFORMANCE STATUS (ECOG): 0 HEENT:  No visual changes, runny nose, sore throat, mouth sores or tenderness. Lungs: No shortness of breath or cough.  No hemoptysis. Cardiac:  No chest pain, palpitations, orthopnea, or PND. GI:  No nausea, vomiting, diarrhea, constipation, melena or hematochezia. GU:  No urgency, frequency, dysuria, or hematuria. Musculoskeletal:  No back pain.  No joint pain.  No muscle tenderness. Extremities:  No pain or swelling. Skin:  No rashes or skin changes. Neuro:  No headache, numbness or weakness, balance or coordination issues. Endocrine:  No diabetes, thyroid issues, hot flashes or night sweats. Psych:  No mood changes, depression or anxiety. Pain:  No focal pain. Review of systems:  All other systems reviewed and found to be negative. As per HPI. Otherwise, a complete review of systems is negatve.  PAST MEDICAL HISTORY: Past Medical History  Diagnosis Date  . Syncope \  .  Hypothyroid   . Diabetes mellitus     NIDD  . Hyperlipidemia   . Obesity   . Migraine headache   . Depression     PAST SURGICAL HISTORY: Past Surgical History  Procedure Laterality Date  . Appendectomy    . Cholecystectomy    . Abdominal hysterectomy    . Inguinal hernia repair      rt and left  . Lexiscan cardiolite    . Tilt table study N/A 04/01/2012    Procedure: TILT TABLE STUDY;  Surgeon: Deboraha Sprang, MD;  Location: [facility] CATH LAB;  Service: Cardiovascular;  Laterality: N/A;    FAMILY HISTORY Family History  Problem Relation Age of Onset  . Cancer Father   . Hypertension Mother   . Hyperlipidemia Mother   . Alcohol abuse Brother   . Irritable bowel syndrome Sister     ADVANCED DIRECTIVES:  No flowsheet data found.  HEALTH MAINTENANCE: History  Substance Use Topics  . Smoking status: Former Smoker    Types: Cigarettes  . Smokeless tobacco: Never Used  . Alcohol Use: No      No Known Allergies  Current Outpatient Prescriptions  Medication Sig Dispense Refill  . ALPRAZolam (XANAX) 1 MG tablet Take 1 mg by mouth at bedtime as needed. For sleep    . Asenapine Maleate (SAPHRIS) 10 MG SUBL Place 10 mg under the tongue.    Marland Kitchen atorvastatin (LIPITOR) 10 MG tablet Take 10 mg by mouth daily.    Marland Kitchen buPROPion (WELLBUTRIN XL) 300 MG 24 hr tablet Take 300 mg by mouth daily.    . fish  oil-omega-3 fatty acids 1000 MG capsule Take 1 g by mouth 2 (two) times daily.    Marland Kitchen lamoTRIgine (LAMICTAL) 25 MG tablet Take 50 mg by mouth 2 (two) times daily.     Marland Kitchen levothyroxine (SYNTHROID, LEVOTHROID) 150 MCG tablet Take 225 mcg by mouth daily.     . modafinil (PROVIGIL) 200 MG tablet Take 200 mg by mouth daily.    . Multiple Vitamin (MULITIVITAMIN WITH MINERALS) TABS Take 1 tablet by mouth daily.    . Nutritional Supplements (MELATONIN PO) Take 10 mg by mouth daily.    . primidone (MYSOLINE) 50 MG tablet Take 100 mg by mouth at bedtime.     . sitaGLIPtin-metformin (JANUMET) 50-1000 MG  per tablet TAKE ONE TABLET BY MOUTH TWICE DAILY    . topiramate (TOPAMAX) 200 MG tablet Take 200 mg by mouth 2 (two) times daily.    . Vilazodone HCl (VIIBRYD) 40 MG TABS Take 40 mg by mouth daily.    . Cholecalciferol (VITAMIN D-3 PO) Take 1,000 mg by mouth daily.    . metFORMIN (GLUCOPHAGE) 500 MG tablet Take 500 mg by mouth 2 (two) times daily with a meal.    . prochlorperazine (COMPAZINE) 10 MG tablet Take 10 mg by mouth every 6 (six) hours as needed. For nausea    . risperiDONE (RISPERDAL) 1 MG tablet Take 1 mg by mouth daily.    . sucralfate (CARAFATE) 1 G tablet Take 1 g by mouth 4 (four) times daily.     No current facility-administered medications for this visit.    OBJECTIVE:  Filed Vitals:   04/25/15 0956  BP: 125/89  Pulse: 101  Temp: 97.7 F (36.5 C)     Body mass index is 34.99 kg/(m^2).    ECOG FS:0 - Asymptomatic  PHYSICAL EXAM: General status: Performance status is good.  Patient has not lost significant weight BMI is 35 patient is moderately obese HEENT: No evidence of stomatitis. Sclera and conjunctivae :: No jaundice.   pale looking. Lungs: Air  entry equal on both sides.  No rhonchi.  No rales.  Cardiac: Heart sounds are normal.  No pericardial rub.  No murmur. Lymphatic system: Cervical, axillary, inguinal, lymph nodes not palpable GI: Abdomen is soft.  No ascites.  Liver spleen not palpable.  No tenderness.  Bowel sounds are within normal limit Lower extremity: No edema Neurological system: Higher functions, cranial nerves intact no evidence of peripheral neuropathy. Skin: No rash.  No ecchymosis.Marland Kitchen   LAB RESULTS:  Appointment on 04/25/2015  Component Date Value Ref Range Status  . WBC 04/25/2015 7.1  3.6 - 11.0 K/uL Final  . RBC 04/25/2015 4.59  3.80 - 5.20 MIL/uL Final  . Hemoglobin 04/25/2015 13.9  12.0 - 16.0 g/dL Final  . HCT 04/25/2015 42.0  35.0 - 47.0 % Final  . MCV 04/25/2015 91.6  80.0 - 100.0 fL Final  . MCH 04/25/2015 30.4  26.0 - 34.0 pg  Final  . MCHC 04/25/2015 33.2  32.0 - 36.0 g/dL Final  . RDW 04/25/2015 13.9  11.5 - 14.5 % Final  . Platelets 04/25/2015 213  150 - 440 K/uL Final  . Neutrophils Relative % 04/25/2015 56   Final  . Neutro Abs 04/25/2015 4.0  1.4 - 6.5 K/uL Final  . Lymphocytes Relative 04/25/2015 26   Final  . Lymphs Abs 04/25/2015 1.8  1.0 - 3.6 K/uL Final  . Monocytes Relative 04/25/2015 7   Final  . Monocytes Absolute 04/25/2015 0.5  0.2 -  0.9 K/uL Final  . Eosinophils Relative 04/25/2015 10   Final  . Eosinophils Absolute 04/25/2015 0.7  0 - 0.7 K/uL Final  . Basophils Relative 04/25/2015 1   Final  . Basophils Absolute 04/25/2015 0.1  0 - 0.1 K/uL Final  . Sodium 04/25/2015 135  135 - 145 mmol/L Final  . Potassium 04/25/2015 4.1  3.5 - 5.1 mmol/L Final  . Chloride 04/25/2015 106  101 - 111 mmol/L Final  . CO2 04/25/2015 23  22 - 32 mmol/L Final  . Glucose, Bld 04/25/2015 223* 65 - 99 mg/dL Final  . BUN 04/25/2015 9  6 - 20 mg/dL Final  . Creatinine, Ser 04/25/2015 0.89  0.44 - 1.00 mg/dL Final  . Calcium 04/25/2015 8.9  8.9 - 10.3 mg/dL Final  . Total Protein 04/25/2015 7.2  6.5 - 8.1 g/dL Final  . Albumin 04/25/2015 4.2  3.5 - 5.0 g/dL Final  . AST 04/25/2015 25  15 - 41 U/L Final  . ALT 04/25/2015 39  14 - 54 U/L Final  . Alkaline Phosphatase 04/25/2015 94  38 - 126 U/L Final  . Total Bilirubin 04/25/2015 0.4  0.3 - 1.2 mg/dL Final  . GFR calc non Af Amer 04/25/2015 >60  >60 mL/min Final  . GFR calc Af Amer 04/25/2015 >60  >60 mL/min Final   Comment: (NOTE) The eGFR has been calculated using the CKD EPI equation. This calculation has not been validated in all clinical situations. eGFR's persistently <60 mL/min signify possible Chronic Kidney Disease.   . Anion gap 04/25/2015 6  5 - 15 Final      STUDIES: Ct Chest Wo Contrast  04/23/2015   CLINICAL DATA:  Intermittent chest pain. History of sarcoidosis. Initial diagnosis 1 year ago.  EXAM: CT CHEST WITHOUT CONTRAST  TECHNIQUE:  Multidetector CT imaging of the chest was performed following the standard protocol without IV contrast.  COMPARISON:  Chest CT 06/15/2014 and 05/16/2014  FINDINGS: Chest wall: No breast masses, supraclavicular or axillary lymphadenopathy. Small scattered lymph nodes are stable. The thyroid gland is normal and stable. A few small calcifications are noted. The bony thorax is intact. No destructive bone lesions or spinal canal compromise.  Mediastinal: Stable mediastinal and hilar lymphadenopathy consistent with sarcoidosis. The aorta is normal in caliber. The esophagus is grossly normal.  Lungs/ pleura: Chronic airspace opacities in both lungs consistent with alveolar sarcoid. There are also some patchy areas of bronchovascular nodularity in both upper lobes. No acute overlying pulmonary process. No pleural effusions. No worrisome pulmonary lesions.  Upper abdomen:  No significant findings.  IMPRESSION: Stable mediastinal and hilar lymphadenopathy consistent with known sarcoidosis.  Stable bilateral airspace opacities consistent with sarcoidosis.  No acute overlying pulmonary findings.   Electronically Signed   By: Marijo Sanes M.D.   On: 04/23/2015 16:36    ASSESSMENT: Pulmonary sarcoidosis.  At present time asymptomatic  MEDICAL DECISION MAKING:  CT scan dated April 23, 2015 has been reviewed.  Stable mediastinal and hilar adenopathy. The patient had been discharged from my care and will be followed by pulmonologist patient has been referred to Dr. Raul Del  Patient expressed understanding and was in agreement with this plan. She also understands that She can call clinic at any time with any questions, concerns, or complaints.    No matching staging information was found for the patient.  Forest Gleason, MD   05/12/2015 3:14 PM

## 2015-07-09 ENCOUNTER — Other Ambulatory Visit: Payer: Self-pay | Admitting: Unknown Physician Specialty

## 2015-07-09 DIAGNOSIS — R928 Other abnormal and inconclusive findings on diagnostic imaging of breast: Secondary | ICD-10-CM

## 2015-07-29 DIAGNOSIS — F419 Anxiety disorder, unspecified: Secondary | ICD-10-CM | POA: Insufficient documentation

## 2015-08-08 ENCOUNTER — Inpatient Hospital Stay
Admission: RE | Admit: 2015-08-08 | Discharge: 2015-08-08 | Disposition: A | Discharge status: Home / Self Care | Payer: Self-pay | Source: Ambulatory Visit | Attending: *Deleted | Admitting: *Deleted

## 2015-08-08 ENCOUNTER — Other Ambulatory Visit: Payer: Self-pay | Admitting: *Deleted

## 2015-08-08 DIAGNOSIS — Z9289 Personal history of other medical treatment: Secondary | ICD-10-CM

## 2015-11-15 DIAGNOSIS — E782 Mixed hyperlipidemia: Secondary | ICD-10-CM | POA: Insufficient documentation

## 2015-11-15 DIAGNOSIS — E785 Hyperlipidemia, unspecified: Secondary | ICD-10-CM | POA: Insufficient documentation

## 2015-11-15 DIAGNOSIS — R7303 Prediabetes: Secondary | ICD-10-CM | POA: Insufficient documentation

## 2015-11-15 DIAGNOSIS — E119 Type 2 diabetes mellitus without complications: Secondary | ICD-10-CM | POA: Insufficient documentation

## 2015-11-15 DIAGNOSIS — E118 Type 2 diabetes mellitus with unspecified complications: Secondary | ICD-10-CM | POA: Insufficient documentation

## 2016-07-09 ENCOUNTER — Other Ambulatory Visit: Payer: Self-pay | Admitting: Specialist

## 2016-07-09 DIAGNOSIS — R918 Other nonspecific abnormal finding of lung field: Secondary | ICD-10-CM

## 2016-07-14 ENCOUNTER — Ambulatory Visit: Payer: Self-pay | Admitting: Urology

## 2016-07-16 ENCOUNTER — Ambulatory Visit
Admission: RE | Admit: 2016-07-16 | Discharge: 2016-07-16 | Disposition: A | Discharge status: Home / Self Care | Payer: BLUE CROSS/BLUE SHIELD | Source: Ambulatory Visit | Attending: Specialist | Admitting: Specialist

## 2016-07-16 DIAGNOSIS — R161 Splenomegaly, not elsewhere classified: Secondary | ICD-10-CM | POA: Insufficient documentation

## 2016-07-16 DIAGNOSIS — R918 Other nonspecific abnormal finding of lung field: Secondary | ICD-10-CM

## 2016-07-16 DIAGNOSIS — D860 Sarcoidosis of lung: Secondary | ICD-10-CM | POA: Insufficient documentation

## 2016-07-16 MED ORDER — IOPAMIDOL (ISOVUE-300) INJECTION 61%
75.0000 mL | Freq: Once | INTRAVENOUS | Status: AC | PRN
  Administered 2016-07-16: 60 mL via INTRAVENOUS

## 2016-07-17 ENCOUNTER — Encounter: Payer: Self-pay | Admitting: Urology

## 2016-07-17 ENCOUNTER — Ambulatory Visit (INDEPENDENT_AMBULATORY_CARE_PROVIDER_SITE_OTHER): Payer: BLUE CROSS/BLUE SHIELD | Admitting: Urology

## 2016-07-17 VITALS — BP 102/65 | HR 76 | Ht 64.0 in | Wt 166.4 lb

## 2016-07-17 DIAGNOSIS — N941 Unspecified dyspareunia: Secondary | ICD-10-CM

## 2016-07-17 DIAGNOSIS — N952 Postmenopausal atrophic vaginitis: Secondary | ICD-10-CM

## 2016-07-17 DIAGNOSIS — N3946 Mixed incontinence: Secondary | ICD-10-CM

## 2016-07-17 DIAGNOSIS — R32 Unspecified urinary incontinence: Secondary | ICD-10-CM | POA: Diagnosis not present

## 2016-07-17 LAB — BLADDER SCAN AMB NON-IMAGING: Scan Result: 25

## 2016-07-17 MED ORDER — ESTRADIOL 0.1 MG/GM VA CREA: TOPICAL_CREAM | VAGINAL | 12 refills | Status: DC

## 2016-07-17 MED ORDER — ESTROGENS, CONJUGATED 0.625 MG/GM VA CREA: 1.0000 | TOPICAL_CREAM | Freq: Every day | VAGINAL | 12 refills | Status: DC

## 2016-07-17 NOTE — Progress Notes (Signed)
07/17/2016 11:40 AM   Jennifer Bright 08/06/1966 [id number]  Referring provider: Dorothey Baseman, MD [phone] [place], [place] [address]  Chief Complaint  Patient presents with  . Urinary Incontinence    HPI: Patient is a 50 year old Caucasian female who presents today as a referral from [facility] for urinary incontinence.  She states for the last several months she's been having frequency, nocturia, intermittency, hesitancy and straining to urinate.  She is experiencing both stress and urge incontinence.  She states that the volume of incontinence is getting more severe and she is starting to wear incontinence pads.  She is not experiencing dysuria, gross hematuria or suprapubic pain. She also denies any fevers, chills, nausea or vomiting.  She is also experiencing dyspareunia.  She describes the pain as frictional in nature. They are using vaginal lubricants, but they have not relieved the pain.  She does not have a history of urinary tract infections, STI's or injury to the bladder.   She does have a history of nephrolithiasis and had a bladder sling placed in 2005.    She is peri menopausal.  She admits to constipation and/or diarrhea.  She is not having pain with bladder filling.    She is drinking good amount of water daily.   She is drinking 2 caffeinated beverages daily.  She is drinking not alcoholic beverages.   daily.    Her risk factors for incontinence are obesity, age, smoking, caffeine, diabetes, depression, vaginal atrophy, pelvic surgery and/or neurological disorders.   She is taking benzodiazepines, diuretics, antidepressants and antipsychotics.  Her PVR today was 25 mL.  She is wanting non surgical options for her urinary symptoms.    PMH: Past Medical History:  Diagnosis Date  . Acid reflux   . Anxiety   . Depression   . Diabetes mellitus    NIDD  . Hyperlipidemia   . Hypothyroid   . Migraine headache   . Obesity   . Syncope \     Surgical History: Past Surgical History:  Procedure Laterality Date  . ABDOMINAL HYSTERECTOMY    . APPENDECTOMY    . BLADDER SURGERY     bladder tact  . CESAREAN SECTION    . CHOLECYSTECTOMY    . HERNIA REPAIR    . INGUINAL HERNIA REPAIR     rt and left  . lexiscan cardiolite    . TILT TABLE STUDY N/A 04/01/2012   Procedure: TILT TABLE STUDY;  Surgeon: Duke Salvia, MD;  Location: [facility] CATH LAB;  Service: Cardiovascular;  Laterality: N/A;    Home Medications:    Medication List       Accurate as of 07/17/16 11:40 AM. Always use your most recent med list.          ALPRAZolam 1 MG tablet Commonly known as:  XANAX Take 1 mg by mouth at bedtime as needed. For sleep   atorvastatin 10 MG tablet Commonly known as:  LIPITOR Take 10 mg by mouth daily.   benztropine 1 MG tablet Commonly known as:  COGENTIN Take by mouth.   buPROPion 300 MG 24 hr tablet Commonly known as:  WELLBUTRIN XL Take 300 mg by mouth daily.   conjugated estrogens vaginal cream Commonly known as:  PREMARIN Place 1 Applicatorful vaginally daily. Apply 0.5mg  (pea-sized amount)  just inside the vaginal introitus with a finger-tip every night for two weeks and then Monday, Wednesday and Friday nights.   docusate sodium 100 MG capsule Commonly  known as:  COLACE Take by mouth.   estradiol 0.1 MG/GM vaginal cream Commonly known as:  ESTRACE VAGINAL Apply 0.5mg  (pea-sized amount)  just inside the vaginal introitus with a finger-tip every night for two weeks and then Monday, Wednesday and Friday nights.   fish oil-omega-3 fatty acids 1000 MG capsule Take 1 g by mouth 2 (two) times daily.   glimepiride 2 MG tablet Commonly known as:  AMARYL Take by mouth.   JANUMET 50-1000 MG tablet Generic drug:  sitaGLIPtin-metformin TAKE ONE TABLET BY MOUTH TWICE DAILY   lamoTRIgine 25 MG tablet Commonly known as:  LAMICTAL Take 50 mg by mouth 2 (two) times daily.   levothyroxine 150 MCG tablet Commonly  known as:  SYNTHROID, LEVOTHROID Take 225 mcg by mouth daily.   liothyronine 5 MCG tablet Commonly known as:  CYTOMEL TAKE ONE TABLET TWICE DAILY   MELATONIN PO Take 10 mg by mouth daily.   modafinil 200 MG tablet Commonly known as:  PROVIGIL Take 200 mg by mouth daily.   multivitamin with minerals Tabs tablet Take 1 tablet by mouth daily.   nadolol 20 MG tablet Commonly known as:  CORGARD Take by mouth.   ONE TOUCH ULTRA TEST test strip Generic drug:  glucose blood TEST TWICE A DAY AS DIRECTED   ONETOUCH DELICA LANCETS FINE Misc USE AS DIRECTED   predniSONE 20 MG tablet Commonly known as:  DELTASONE Take by mouth.   primidone 50 MG tablet Commonly known as:  MYSOLINE Take 100 mg by mouth at bedtime.   propranolol 60 MG tablet Commonly known as:  INDERAL Take by mouth.   SAPHRIS 10 MG Subl Generic drug:  Asenapine Maleate Place 10 mg under the tongue.   topiramate 200 MG tablet Commonly known as:  TOPAMAX Take 200 mg by mouth 2 (two) times daily.   VIIBRYD 40 MG Tabs Generic drug:  Vilazodone HCl Take 40 mg by mouth daily.       Allergies:  Allergies  Allergen Reactions  . Cleocin [Clindamycin Hcl] Other (See Comments)    GI distress    Family History: Family History  Problem Relation Age of Onset  . Cancer Father   . Hypertension Mother   . Hyperlipidemia Mother   . Alcohol abuse Brother   . Irritable bowel syndrome Sister   . Kidney cancer Neg Hx   . Kidney disease Neg Hx   . Prostate cancer Neg Hx     Social History:  reports that she has quit smoking. Her smoking use included Cigarettes. She has never used smokeless tobacco. She reports that she does not drink alcohol. Her drug history is not on file.  ROS: UROLOGY Frequent Urination?: Yes Hard to postpone urination?: No Burning/pain with urination?: No Get up at night to urinate?: Yes Leakage of urine?: Yes Urine stream starts and stops?: Yes Trouble starting stream?: Yes Do  you have to strain to urinate?: Yes Blood in urine?: No Urinary tract infection?: No Sexually transmitted disease?: No Injury to kidneys or bladder?: No Painful intercourse?: Yes Weak stream?: No Currently pregnant?: No Vaginal bleeding?: No Last menstrual period?: n  Gastrointestinal Nausea?: No Vomiting?: No Indigestion/heartburn?: No Diarrhea?: Yes Constipation?: Yes  Constitutional Fever: No Night sweats?: No Weight loss?: Yes Fatigue?: No  Skin Skin rash/lesions?: No Itching?: No  Eyes Blurred vision?: No Double vision?: No  Ears/Nose/Throat Sore throat?: No Sinus problems?: No  Hematologic/Lymphatic Swollen glands?: No Easy bruising?: No  Cardiovascular Leg swelling?: No Chest pain?: Yes  Respiratory Cough?: Yes Shortness of breath?: Yes  Endocrine Excessive thirst?: Yes  Musculoskeletal Back pain?: Yes Joint pain?: Yes  Neurological Headaches?: Yes Dizziness?: Yes  Psychologic Depression?: Yes Anxiety?: Yes  Physical Exam: BP 102/65   Pulse 76   Ht 5\' 4"  (1.626 m)   Wt 166 lb 6.4 oz (75.5 kg)   BMI 28.56 kg/m   Constitutional: Well nourished. Alert and oriented, No acute distress. HEENT: [place] AT, moist mucus membranes. Trachea midline, no masses. Cardiovascular: No clubbing, cyanosis, or edema. Respiratory: Normal respiratory effort, no increased work of breathing. GI: Abdomen is soft, non tender, non distended, no abdominal masses. Liver and spleen not palpable.  No hernias appreciated.  Stool sample for occult testing is not indicated.   GU: No CVA tenderness.  No bladder fullness or masses.  Atrophic external genitalia, normal pubic hair distribution, no lesions.  Normal urethral meatus, no lesions, no prolapse, no discharge.   No urethral masses, tenderness and/or tenderness. No bladder fullness, tenderness or masses. Pale vagina mucosa, poor estrogen effect, no discharge, no lesions, good pelvic support, no cystocele or rectocele  noted.  No cervical motion tenderness.  Uterus is surgically absent.  No adnexal/parametria masses or tenderness noted.  Anus and perineum are without rashes or lesions.    Skin: No rashes, bruises or suspicious lesions. Lymph: No cervical or inguinal adenopathy. Neurologic: Grossly intact, no focal deficits, moving all 4 extremities. Psychiatric: Normal mood and affect.  Laboratory Data: Lab Results  Component Value Date   WBC 7.1 04/25/2015   HGB 13.9 04/25/2015   HCT 42.0 04/25/2015   MCV 91.6 04/25/2015   PLT 213 04/25/2015    Lab Results  Component Value Date   CREATININE 0.89 04/25/2015     Lab Results  Component Value Date   HGBA1C 6.2 05/17/2014    Lab Results  Component Value Date   TSH 0.05 (L) 05/17/2014       Component Value Date/Time   CHOL 157 05/17/2014 0414   HDL 29 (L) 05/17/2014 0414   VLDL 54 (H) 05/17/2014 0414   LDLCALC 74 05/17/2014 0414    Lab Results  Component Value Date   AST 25 04/25/2015   Lab Results  Component Value Date   ALT 39 04/25/2015    Pertinent Imaging: Results for Jennifer QuamMCINTYRE, Jennifer Bright (MRN [id number]) as of 07/22/2016 21:33  Ref. Range 07/17/2016 11:19  Scan Result Unknown 25    Assessment & Plan:    1. Mixed incontinence  - offered behavioral therapies, bladder training, bladder control strategies, pelvic floor muscle training- she would like a referral to PT  - RTC in 6 weeks for PVR and symptom recheck   - BLADDER SCAN AMB NON-IMAGING  2. Vaginal atrophy  - may be contributing to her dyspareunia  - Patient was given a sample of vaginal estrogen cream (Premarin/Estrace) and instructed to apply 0.5mg  (pea-sized amount)  just inside the vaginal introitus with a finger-tip every night for two weeks and then Monday, Wednesday and Friday nights.  I explained to the patient that vaginally administered estrogen, which causes only a slight increase in the blood estrogen levels, have fewer contraindications and adverse  systemic effects that oral HT.  - I have also given prescriptions for the Estrace cream and Premarin cream, so that the patient may carry them to the pharmacy to see which one of the branded creams would be most economical for her.  If she finds both medications cost prohibitive, she is instructed to  call the office.  We can then call in a compounded vaginal estrogen cream for the patient that may be more affordable.    - She will follow up in 6 weeks for an exam.    3. Dyspareunia  - See above.  - Reassess when she returns in 6 weeks  Return in about 6 weeks (around 08/28/2016) for exam and PVR.  These notes generated with voice recognition software. I apologize for typographical errors.  Michiel Cowboy, PA-C  [facility] [address], [address] [place], [place] [address] [phone]

## 2016-07-17 NOTE — Patient Instructions (Signed)
I have given you two prescriptions for a vaginal estrogen cream.  Estrace and Premarin.  Please take these to your pharmacy and see which one your insurance covers.  If both are too expensive, please call the office at [phone] for an alternative.  You are given a sample of vaginal estrogen cream (Estrace) and instructed to apply 0.5mg (pea-sized amount)  just inside the vaginal introitus with a finger-tip every night for two weeks.    

## 2016-07-22 ENCOUNTER — Encounter: Payer: Self-pay | Admitting: Urology

## 2016-07-24 ENCOUNTER — Inpatient Hospital Stay
Admission: EM | Admit: 2016-07-24 | Discharge: 2016-07-26 | DRG: 641 | Disposition: A | Discharge status: Home / Self Care | Payer: BLUE CROSS/BLUE SHIELD | Attending: Internal Medicine | Admitting: Internal Medicine

## 2016-07-24 ENCOUNTER — Encounter: Payer: Self-pay | Admitting: Emergency Medicine

## 2016-07-24 DIAGNOSIS — E669 Obesity, unspecified: Secondary | ICD-10-CM | POA: Diagnosis present

## 2016-07-24 DIAGNOSIS — E039 Hypothyroidism, unspecified: Secondary | ICD-10-CM | POA: Diagnosis present

## 2016-07-24 DIAGNOSIS — E876 Hypokalemia: Secondary | ICD-10-CM | POA: Diagnosis present

## 2016-07-24 DIAGNOSIS — N179 Acute kidney failure, unspecified: Secondary | ICD-10-CM | POA: Diagnosis present

## 2016-07-24 DIAGNOSIS — Z888 Allergy status to other drugs, medicaments and biological substances status: Secondary | ICD-10-CM | POA: Diagnosis not present

## 2016-07-24 DIAGNOSIS — F329 Major depressive disorder, single episode, unspecified: Secondary | ICD-10-CM | POA: Diagnosis present

## 2016-07-24 DIAGNOSIS — Z87891 Personal history of nicotine dependence: Secondary | ICD-10-CM | POA: Diagnosis not present

## 2016-07-24 DIAGNOSIS — M6281 Muscle weakness (generalized): Secondary | ICD-10-CM

## 2016-07-24 DIAGNOSIS — E119 Type 2 diabetes mellitus without complications: Secondary | ICD-10-CM | POA: Diagnosis present

## 2016-07-24 DIAGNOSIS — Z79899 Other long term (current) drug therapy: Secondary | ICD-10-CM | POA: Diagnosis not present

## 2016-07-24 DIAGNOSIS — G43909 Migraine, unspecified, not intractable, without status migrainosus: Secondary | ICD-10-CM | POA: Diagnosis present

## 2016-07-24 DIAGNOSIS — K219 Gastro-esophageal reflux disease without esophagitis: Secondary | ICD-10-CM | POA: Diagnosis present

## 2016-07-24 DIAGNOSIS — G2581 Restless legs syndrome: Secondary | ICD-10-CM | POA: Diagnosis present

## 2016-07-24 DIAGNOSIS — E785 Hyperlipidemia, unspecified: Secondary | ICD-10-CM | POA: Diagnosis present

## 2016-07-24 DIAGNOSIS — R112 Nausea with vomiting, unspecified: Secondary | ICD-10-CM

## 2016-07-24 DIAGNOSIS — G4730 Sleep apnea, unspecified: Secondary | ICD-10-CM | POA: Diagnosis present

## 2016-07-24 DIAGNOSIS — Z8249 Family history of ischemic heart disease and other diseases of the circulatory system: Secondary | ICD-10-CM | POA: Diagnosis not present

## 2016-07-24 DIAGNOSIS — R109 Unspecified abdominal pain: Secondary | ICD-10-CM

## 2016-07-24 DIAGNOSIS — D869 Sarcoidosis, unspecified: Secondary | ICD-10-CM | POA: Insufficient documentation

## 2016-07-24 DIAGNOSIS — F319 Bipolar disorder, unspecified: Secondary | ICD-10-CM | POA: Diagnosis present

## 2016-07-24 DIAGNOSIS — Z6830 Body mass index (BMI) 30.0-30.9, adult: Secondary | ICD-10-CM

## 2016-07-24 DIAGNOSIS — E860 Dehydration: Secondary | ICD-10-CM | POA: Diagnosis present

## 2016-07-24 DIAGNOSIS — Z7984 Long term (current) use of oral hypoglycemic drugs: Secondary | ICD-10-CM | POA: Diagnosis not present

## 2016-07-24 DIAGNOSIS — F419 Anxiety disorder, unspecified: Secondary | ICD-10-CM | POA: Diagnosis present

## 2016-07-24 HISTORY — DX: Sarcoidosis, unspecified: D86.9

## 2016-07-24 LAB — CBC
HEMATOCRIT: 32.2 % — AB (ref 35.0–47.0)
HEMOGLOBIN: 11.2 g/dL — AB (ref 12.0–16.0)
MCH: 30.7 pg (ref 26.0–34.0)
MCHC: 34.8 g/dL (ref 32.0–36.0)
MCV: 88.1 fL (ref 80.0–100.0)
Platelets: 239 10*3/uL (ref 150–440)
RBC: 3.65 MIL/uL — ABNORMAL LOW (ref 3.80–5.20)
RDW: 13.8 % (ref 11.5–14.5)
WBC: 8.4 10*3/uL (ref 3.6–11.0)

## 2016-07-24 LAB — COMPREHENSIVE METABOLIC PANEL
ALT: 108 U/L — ABNORMAL HIGH (ref 14–54)
ANION GAP: 10 (ref 5–15)
AST: 34 U/L (ref 15–41)
Albumin: 3.7 g/dL (ref 3.5–5.0)
Alkaline Phosphatase: 111 U/L (ref 38–126)
BILIRUBIN TOTAL: 0.2 mg/dL — AB (ref 0.3–1.2)
BUN: 21 mg/dL — AB (ref 6–20)
CO2: 23 mmol/L (ref 22–32)
Calcium: 13.2 mg/dL (ref 8.9–10.3)
Chloride: 106 mmol/L (ref 101–111)
Creatinine, Ser: 2 mg/dL — ABNORMAL HIGH (ref 0.44–1.00)
GFR calc Af Amer: 33 mL/min — ABNORMAL LOW (ref >60)
GFR, EST NON AFRICAN AMERICAN: 28 mL/min — AB (ref >60)
Glucose, Bld: 185 mg/dL — ABNORMAL HIGH (ref 65–99)
POTASSIUM: 4 mmol/L (ref 3.5–5.1)
Sodium: 139 mmol/L (ref 135–145)
TOTAL PROTEIN: 7.4 g/dL (ref 6.5–8.1)

## 2016-07-24 LAB — GLUCOSE, CAPILLARY
GLUCOSE-CAPILLARY: 114 mg/dL — AB (ref 65–99)
Glucose-Capillary: 128 mg/dL — ABNORMAL HIGH (ref 65–99)

## 2016-07-24 LAB — MAGNESIUM: MAGNESIUM: 1 mg/dL — AB (ref 1.7–2.4)

## 2016-07-24 MED ORDER — ALBUTEROL SULFATE (2.5 MG/3ML) 0.083% IN NEBU: 2.5000 mg | INHALATION_SOLUTION | RESPIRATORY_TRACT | Status: DC | PRN

## 2016-07-24 MED ORDER — OMEGA-3-ACID ETHYL ESTERS 1 G PO CAPS
1.0000 g | ORAL_CAPSULE | Freq: Two times a day (BID) | ORAL | Status: DC
  Administered 2016-07-24 – 2016-07-26 (×4): 1 g via ORAL
  Filled 2016-07-24 (×4): qty 1

## 2016-07-24 MED ORDER — OXYCODONE HCL 5 MG PO TABS
5.0000 mg | ORAL_TABLET | ORAL | Status: DC | PRN
  Administered 2016-07-25: 5 mg via ORAL
  Filled 2016-07-24: qty 1

## 2016-07-24 MED ORDER — TOPIRAMATE 100 MG PO TABS
300.0000 mg | ORAL_TABLET | Freq: Every day | ORAL | Status: DC
  Administered 2016-07-24 – 2016-07-25 (×2): 300 mg via ORAL
  Filled 2016-07-24 (×3): qty 3

## 2016-07-24 MED ORDER — OMEGA-3 FATTY ACIDS 1000 MG PO CAPS: 1.0000 g | ORAL_CAPSULE | Freq: Two times a day (BID) | ORAL | Status: DC

## 2016-07-24 MED ORDER — BUPROPION HCL ER (XL) 300 MG PO TB24
300.0000 mg | ORAL_TABLET | Freq: Every day | ORAL | Status: DC
  Administered 2016-07-25 – 2016-07-26 (×2): 300 mg via ORAL
  Filled 2016-07-24 (×2): qty 1

## 2016-07-24 MED ORDER — ASENAPINE MALEATE 5 MG SL SUBL
20.0000 mg | SUBLINGUAL_TABLET | Freq: Every day | SUBLINGUAL | Status: DC
  Administered 2016-07-24 – 2016-07-25 (×2): 20 mg via SUBLINGUAL
  Filled 2016-07-24 (×2): qty 4

## 2016-07-24 MED ORDER — ACETAMINOPHEN 650 MG RE SUPP: 650.0000 mg | Freq: Four times a day (QID) | RECTAL | Status: DC | PRN

## 2016-07-24 MED ORDER — ONDANSETRON HCL 4 MG/2ML IJ SOLN: 4.0000 mg | Freq: Four times a day (QID) | INTRAMUSCULAR | Status: DC | PRN

## 2016-07-24 MED ORDER — LIOTHYRONINE SODIUM 5 MCG PO TABS
5.0000 ug | ORAL_TABLET | Freq: Two times a day (BID) | ORAL | Status: DC
  Administered 2016-07-24 – 2016-07-26 (×4): 5 ug via ORAL
  Filled 2016-07-24 (×5): qty 1

## 2016-07-24 MED ORDER — PRIMIDONE 50 MG PO TABS
100.0000 mg | ORAL_TABLET | Freq: Every day | ORAL | Status: DC
  Administered 2016-07-24 – 2016-07-25 (×2): 100 mg via ORAL
  Filled 2016-07-24 (×3): qty 2

## 2016-07-24 MED ORDER — BENZTROPINE MESYLATE 1 MG PO TABS
1.0000 mg | ORAL_TABLET | Freq: Two times a day (BID) | ORAL | Status: DC
  Administered 2016-07-24 – 2016-07-26 (×4): 1 mg via ORAL
  Filled 2016-07-24: qty 1
  Filled 2016-07-24: qty 2
  Filled 2016-07-24 (×3): qty 1

## 2016-07-24 MED ORDER — DOCUSATE SODIUM 100 MG PO CAPS
100.0000 mg | ORAL_CAPSULE | Freq: Two times a day (BID) | ORAL | Status: DC
  Administered 2016-07-24 – 2016-07-26 (×4): 100 mg via ORAL
  Filled 2016-07-24 (×4): qty 1

## 2016-07-24 MED ORDER — SODIUM CHLORIDE 0.9 % IV BOLUS (SEPSIS)
1000.0000 mL | Freq: Once | INTRAVENOUS | Status: AC
  Administered 2016-07-24: 1000 mL via INTRAVENOUS

## 2016-07-24 MED ORDER — SODIUM CHLORIDE 0.9 % IV SOLN
INTRAVENOUS | Status: DC
  Administered 2016-07-24 – 2016-07-25 (×2): 125 mL/h via INTRAVENOUS
  Administered 2016-07-26: 02:00:00 via INTRAVENOUS

## 2016-07-24 MED ORDER — ASENAPINE MALEATE 10 MG SL SUBL: 10.0000 mg | SUBLINGUAL_TABLET | Freq: Every day | SUBLINGUAL | Status: DC

## 2016-07-24 MED ORDER — BISACODYL 10 MG RE SUPP: 10.0000 mg | Freq: Every day | RECTAL | Status: DC | PRN

## 2016-07-24 MED ORDER — MODAFINIL 100 MG PO TABS
200.0000 mg | ORAL_TABLET | Freq: Every day | ORAL | Status: DC
  Administered 2016-07-25 – 2016-07-26 (×2): 200 mg via ORAL
  Filled 2016-07-24 (×2): qty 2

## 2016-07-24 MED ORDER — GLIMEPIRIDE 1 MG PO TABS
1.0000 mg | ORAL_TABLET | Freq: Every day | ORAL | Status: DC
  Administered 2016-07-24 – 2016-07-25 (×2): 1 mg via ORAL
  Filled 2016-07-24 (×4): qty 1

## 2016-07-24 MED ORDER — PREDNISONE 20 MG PO TABS
30.0000 mg | ORAL_TABLET | Freq: Every day | ORAL | Status: DC
  Administered 2016-07-25 – 2016-07-26 (×2): 30 mg via ORAL
  Filled 2016-07-24 (×4): qty 1

## 2016-07-24 MED ORDER — LAMOTRIGINE 25 MG PO TABS
25.0000 mg | ORAL_TABLET | Freq: Two times a day (BID) | ORAL | Status: DC
  Administered 2016-07-24 – 2016-07-26 (×4): 25 mg via ORAL
  Filled 2016-07-24 (×5): qty 1

## 2016-07-24 MED ORDER — SODIUM CHLORIDE 0.9% FLUSH
3.0000 mL | Freq: Two times a day (BID) | INTRAVENOUS | Status: DC
  Administered 2016-07-24 – 2016-07-25 (×2): 3 mL via INTRAVENOUS

## 2016-07-24 MED ORDER — MAGNESIUM SULFATE 2 GM/50ML IV SOLN
2.0000 g | Freq: Once | INTRAVENOUS | Status: AC
Start: 2016-07-24 — End: 2016-07-24
  Administered 2016-07-24: 2 g via INTRAVENOUS
  Filled 2016-07-24: qty 50

## 2016-07-24 MED ORDER — TOPIRAMATE 100 MG PO TABS
200.0000 mg | ORAL_TABLET | Freq: Every morning | ORAL | Status: DC
  Administered 2016-07-25 – 2016-07-26 (×2): 200 mg via ORAL
  Filled 2016-07-24 (×2): qty 2

## 2016-07-24 MED ORDER — VILAZODONE HCL 20 MG PO TABS
40.0000 mg | ORAL_TABLET | Freq: Every day | ORAL | Status: DC
  Administered 2016-07-25: 40 mg via ORAL
  Filled 2016-07-24 (×2): qty 2

## 2016-07-24 MED ORDER — ONDANSETRON HCL 4 MG PO TABS: 4.0000 mg | ORAL_TABLET | Freq: Four times a day (QID) | ORAL | Status: DC | PRN

## 2016-07-24 MED ORDER — NADOLOL 20 MG PO TABS
20.0000 mg | ORAL_TABLET | Freq: Every day | ORAL | Status: DC
  Administered 2016-07-25 – 2016-07-26 (×2): 20 mg via ORAL
  Filled 2016-07-24 (×2): qty 1

## 2016-07-24 MED ORDER — SENNA 8.6 MG PO TABS
1.0000 | ORAL_TABLET | Freq: Two times a day (BID) | ORAL | Status: DC
  Administered 2016-07-24 – 2016-07-26 (×4): 8.6 mg via ORAL
  Filled 2016-07-24 (×4): qty 1

## 2016-07-24 MED ORDER — POLYETHYLENE GLYCOL 3350 17 G PO PACK: 17.0000 g | PACK | Freq: Every day | ORAL | Status: DC | PRN

## 2016-07-24 MED ORDER — ATORVASTATIN CALCIUM 20 MG PO TABS
10.0000 mg | ORAL_TABLET | Freq: Every day | ORAL | Status: DC
  Administered 2016-07-25: 10 mg via ORAL
  Filled 2016-07-24: qty 1

## 2016-07-24 MED ORDER — LEVOTHYROXINE SODIUM 75 MCG PO TABS
225.0000 ug | ORAL_TABLET | Freq: Every day | ORAL | Status: DC
  Filled 2016-07-24 (×2): qty 3

## 2016-07-24 MED ORDER — ALPRAZOLAM 0.5 MG PO TABS: 1.0000 mg | ORAL_TABLET | Freq: Every evening | ORAL | Status: DC | PRN

## 2016-07-24 MED ORDER — ACETAMINOPHEN 325 MG PO TABS: 650.0000 mg | ORAL_TABLET | Freq: Four times a day (QID) | ORAL | Status: DC | PRN

## 2016-07-24 MED ORDER — INSULIN ASPART 100 UNIT/ML ~~LOC~~ SOLN
0.0000 [IU] | Freq: Three times a day (TID) | SUBCUTANEOUS | Status: DC
  Administered 2016-07-24: 2 [IU] via SUBCUTANEOUS
  Administered 2016-07-25 – 2016-07-26 (×3): 3 [IU] via SUBCUTANEOUS
  Filled 2016-07-24 (×2): qty 3
  Filled 2016-07-24: qty 2
  Filled 2016-07-24 (×2): qty 3

## 2016-07-24 MED ORDER — ENOXAPARIN SODIUM 40 MG/0.4ML ~~LOC~~ SOLN: 30.0000 mg | SUBCUTANEOUS | Status: DC

## 2016-07-24 MED ORDER — MAGNESIUM SULFATE 2 GM/50ML IV SOLN
2.0000 g | Freq: Once | INTRAVENOUS | Status: AC
  Administered 2016-07-24: 2 g via INTRAVENOUS
  Filled 2016-07-24: qty 50

## 2016-07-24 NOTE — Progress Notes (Signed)
Pt and husband claimed that pt is taking Saphris 20mg  every night however, this medicine was not included in the pt's medicine list. Dr Anne HahnWillis paged, placed an order for Saphris 20mg  sublingual at [facility]. Will administer as ordered.

## 2016-07-24 NOTE — ED Triage Notes (Signed)
Says they sent her from [facility] to be admitted.  Was not symptomatic for anything.  Just had blood work and her calcuim was 14

## 2016-07-24 NOTE — H&P (Signed)
[facility] [place] at [facility]   PATIENT NAME: Jennifer Bright    MR#:  [id number]  DATE OF BIRTH:  17-May-1966  DATE OF ADMISSION:  07/24/2016  PRIMARY CARE PHYSICIAN: Dorothey Baseman, MD   REQUESTING/REFERRING PHYSICIAN: Dr. Inocencio Homes  CHIEF COMPLAINT:   Chief Complaint  Patient presents with  . Abdominal Pain  . Abnormal Lab    HISTORY OF PRESENT ILLNESS:  Jennifer Bright  is a 50 y.o. female with a known history of Sarcoidosis, gastroparesis, diabetes parents to the emergency room due to hypercalcemia sent in by her endocrinologist. And has had on and off vomiting for 2 weeks which resolved yesterday. Mild abdominal pain which is resolved. Her calcium was found to be at 14. Magnesium 1. Being admitted due to acute kidney injury with creatinine of 2, hypercalcemia and hypomagnesemia.  Patient has sarcoidosis diagnosed 3 years back and is on prednisone and follows with pulmonary Dr. Meredeth Ide.  PAST MEDICAL HISTORY:   Past Medical History:  Diagnosis Date  . Acid reflux   . Anxiety   . Depression   . Diabetes mellitus    NIDD  . Hyperlipidemia   . Hypothyroid   . Migraine headache   . Obesity   . Sarcoidosis (HCC)   . Syncope \    PAST SURGICAL HISTORY:   Past Surgical History:  Procedure Laterality Date  . ABDOMINAL HYSTERECTOMY    . APPENDECTOMY    . BLADDER SURGERY     bladder tact  . CESAREAN SECTION    . CHOLECYSTECTOMY    . HERNIA REPAIR    . INGUINAL HERNIA REPAIR     rt and left  . lexiscan cardiolite    . TILT TABLE STUDY N/A 04/01/2012   Procedure: TILT TABLE STUDY;  Surgeon: Duke Salvia, MD;  Location: [facility] CATH LAB;  Service: Cardiovascular;  Laterality: N/A;    SOCIAL HISTORY:   Social History  Substance Use Topics  . Smoking status: Former Smoker    Types: Cigarettes  . Smokeless tobacco: Never Used  . Alcohol use No    FAMILY HISTORY:   Family History  Problem Relation Age of Onset  . Cancer Father    . Hypertension Mother   . Hyperlipidemia Mother   . Alcohol abuse Brother   . Irritable bowel syndrome Sister   . Kidney cancer Neg Hx   . Kidney disease Neg Hx   . Prostate cancer Neg Hx     DRUG ALLERGIES:   Allergies  Allergen Reactions  . Cleocin [Clindamycin Hcl] Other (See Comments)    GI distress    REVIEW OF SYSTEMS:   Review of Systems  Constitutional: Positive for malaise/fatigue. Negative for chills, fever and weight loss.  HENT: Negative for hearing loss and nosebleeds.   Eyes: Negative for blurred vision, double vision and pain.  Respiratory: Negative for cough, hemoptysis, sputum production, shortness of breath and wheezing.   Cardiovascular: Negative for chest pain, palpitations, orthopnea and leg swelling.  Gastrointestinal: Positive for nausea and vomiting. Negative for abdominal pain, constipation and diarrhea.  Genitourinary: Negative for dysuria and hematuria.  Musculoskeletal: Negative for back pain, falls and myalgias.  Skin: Negative for rash.  Neurological: Negative for dizziness, tremors, sensory change, speech change, focal weakness, seizures and headaches.  Endo/Heme/Allergies: Does not bruise/bleed easily.  Psychiatric/Behavioral: Negative for depression and memory loss. The patient is not nervous/anxious.     MEDICATIONS AT HOME:   Prior to Admission medications   Medication Sig  Start Date End Date Taking? Authorizing Provider  ALPRAZolam Prudy Feeler(XANAX) 1 MG tablet Take 1 mg by mouth at bedtime as needed. For sleep   Yes Historical Provider, MD  Asenapine Maleate (SAPHRIS) 10 MG SUBL Place 10 mg under the tongue.   Yes Historical Provider, MD  atorvastatin (LIPITOR) 10 MG tablet Take 10 mg by mouth daily.   Yes Historical Provider, MD  benztropine (COGENTIN) 1 MG tablet Take by mouth.   Yes Historical Provider, MD  buPROPion (WELLBUTRIN XL) 300 MG 24 hr tablet Take 300 mg by mouth daily.   Yes Historical Provider, MD  docusate sodium (COLACE) 100 MG  capsule Take by mouth.   Yes Historical Provider, MD  fish oil-omega-3 fatty acids 1000 MG capsule Take 1 g by mouth 2 (two) times daily.   Yes Historical Provider, MD  glimepiride (AMARYL) 2 MG tablet Take 1 mg by mouth at bedtime.  06/12/16  Yes Historical Provider, MD  lamoTRIgine (LAMICTAL) 25 MG tablet Take 25 mg by mouth 2 (two) times daily.    Yes Historical Provider, MD  levothyroxine (SYNTHROID, LEVOTHROID) 150 MCG tablet Take 225 mcg by mouth daily.    Yes Historical Provider, MD  liothyronine (CYTOMEL) 5 MCG tablet TAKE ONE TABLET TWICE DAILY 05/11/16  Yes Historical Provider, MD  modafinil (PROVIGIL) 200 MG tablet Take 200 mg by mouth daily.   Yes Historical Provider, MD  Multiple Vitamin (MULITIVITAMIN WITH MINERALS) TABS Take 1 tablet by mouth daily.   Yes Historical Provider, MD  nadolol (CORGARD) 20 MG tablet Take by mouth at bedtime.  07/29/15 07/28/16 Yes Historical Provider, MD  Nutritional Supplements (MELATONIN PO) Take 10 mg by mouth daily.   Yes Historical Provider, MD  predniSONE (DELTASONE) 10 MG tablet Take 30 mg by mouth daily with breakfast.   Yes Historical Provider, MD  primidone (MYSOLINE) 50 MG tablet Take 100 mg by mouth at bedtime.    Yes Historical Provider, MD  sitaGLIPtin-metformin (JANUMET) 50-1000 MG per tablet TAKE ONE TABLET BY MOUTH TWICE DAILY 04/23/15  Yes Historical Provider, MD  topiramate (TOPAMAX) 200 MG tablet Take 200 mg by mouth 2 (two) times daily.    Yes Historical Provider, MD  Vilazodone HCl (VIIBRYD) 40 MG TABS Take 40 mg by mouth daily.   Yes Historical Provider, MD  conjugated estrogens (PREMARIN) vaginal cream Place 1 Applicatorful vaginally daily. Apply 0.5mg  (pea-sized amount)  just inside the vaginal introitus with a finger-tip every night for two weeks and then Monday, Wednesday and Friday nights. Patient taking differently: Place 1 Applicatorful vaginally daily. Apply 0.5mg  (pea-sized amount)  just inside the vaginal introitus with a  finger-tip every night for two weeks and then Monday, Wednesday and Friday nights. 07/17/16   Harle BattiestShannon A McGowan, PA-C  estradiol (ESTRACE VAGINAL) 0.1 MG/GM vaginal cream Apply 0.5mg  (pea-sized amount)  just inside the vaginal introitus with a finger-tip every night for two weeks and then Monday, Wednesday and Friday nights. Patient taking differently: Apply 0.5mg  (pea-sized amount)  just inside the vaginal introitus with a finger-tip every night for two weeks and then Monday, Wednesday and Friday nights. 07/17/16   Carollee HerterShannon A McGowan, PA-C  glucose blood (ONE TOUCH ULTRA TEST) test strip TEST TWICE A DAY AS DIRECTED 07/15/16   Historical Provider, MD  Letta PateNETOUCH DELICA LANCETS FINE MISC USE AS DIRECTED 07/15/16   Historical Provider, MD     VITAL SIGNS:  Blood pressure 95/70, pulse 87, temperature 98.1 F (36.7 C), temperature source Oral, resp. rate 18, height  5\' 4"  (1.626 m), weight 73.9 kg (163 lb), SpO2 98 %.  PHYSICAL EXAMINATION:  Physical Exam  GENERAL:  50 y.o.-year-old patient lying in the bed with no acute distress.  EYES: Pupils equal, round, reactive to light and accommodation. No scleral icterus. Extraocular muscles intact.  HEENT: Head atraumatic, normocephalic. Oropharynx and nasopharynx clear. No oropharyngeal erythema, dry oral mucosa  NECK:  Supple, no jugular venous distention. No thyroid enlargement, no tenderness.  LUNGS: Normal breath sounds bilaterally, no wheezing, rales, rhonchi. No use of accessory muscles of respiration.  CARDIOVASCULAR: S1, S2 normal. No murmurs, rubs, or gallops.  ABDOMEN: Soft, nontender, nondistended. Bowel sounds present. No organomegaly or mass.  EXTREMITIES: No pedal edema, cyanosis, or clubbing. + 2 pedal & radial pulses b/l.   NEUROLOGIC: Cranial nerves II through XII are intact. No focal Motor or sensory deficits appreciated b/l PSYCHIATRIC: The patient is alert and oriented x 3. Good affect.  SKIN: No obvious rash, lesion, or ulcer.    LABORATORY PANEL:   CBC  Recent Labs Lab 07/24/16 1255  WBC 8.4  HGB 11.2*  HCT 32.2*  PLT 239   ------------------------------------------------------------------------------------------------------------------  Chemistries   Recent Labs Lab 07/24/16 1255  NA 139  K 4.0  CL 106  CO2 23  GLUCOSE 185*  BUN 21*  CREATININE 2.00*  CALCIUM 13.2*  MG 1.0*  AST 34  ALT 108*  ALKPHOS 111  BILITOT 0.2*   ------------------------------------------------------------------------------------------------------------------  Cardiac Enzymes No results for input(s): TROPONINI in the last 168 hours. ------------------------------------------------------------------------------------------------------------------  RADIOLOGY:  No results found.   IMPRESSION AND PLAN:   * Acute kidney injury due to dehydration from vomiting. Vomiting has resolved. Fluid resuscitated with IV fluid bolus. Then continue maintenance normal saline. Monitor input and output. Repeat labs in the morning.  * Hypercalcemia due to sarcoidosis worsened by dehydration Fluid bolus and IV fluids. Repeat labs in the morning. If no improvement will need to try Lasix.  * Hypomagnesemia due to decreased intake and vomiting Replace through IV and check tomorrow  Total 4 g IV  * Sarcoidosis Continue prednisone from home. Follows with Dr. Meredeth Ide as outpatient.  * DVT prophylaxis with Lovenox  All the records are reviewed and case discussed with ED provider. Management plans discussed with the patient, family and they are in agreement.  CODE STATUS: FULL CODE  TOTAL TIME TAKING CARE OF THIS PATIENT: 40 minutes.   Milagros Loll R M.D on 07/24/2016 at 4:00 PM  Between 7am to 6pm - Pager - [phone]  After 6pm go to [email or web address] - password [email or web address] [facility]  [facility]  Office  [phone]  CC: Primary care physician; Dorothey Baseman, MD  Note: This dictation was prepared with  Dragon dictation along with smaller phrase technology. Any transcriptional errors that result from this process are unintentional.

## 2016-07-24 NOTE — ED Notes (Signed)
Mardene CelesteJoanna called to transport patient to the floor

## 2016-07-24 NOTE — ED Provider Notes (Signed)
[facility] Emergency Department Provider Note   ____________________________________________   First MD Initiated Contact with Patient 07/24/16 1403     (approximate)  I have reviewed the triage vital signs and the nursing notes.   HISTORY  Chief Complaint Abdominal Pain and Abnormal Lab    HPI Jennifer Bright is a 50 y.o. female with history of sarcoidosis, diabetes, migraines, thyroid disease, bipolar disorder who presents from GI clinic for evaluation of hypercalcemia, gradual onset, constant, severe, no modifying factors. Patient reports over the past 2-3 weeks she has been having "vomiting every 2 or 3 days". She is also having diffuse abdominal pain however she reports that this is ongoing and is chronic for 5 or 6 years. No diarrhea, fevers or chills. No chest pain or difficulty breathing. Seen in GI clinic and calcium was noted to be 14, sent to ER for evaluation.   Past Medical History:  Diagnosis Date  . Acid reflux   . Anxiety   . Depression   . Diabetes mellitus    NIDD  . Hyperlipidemia   . Hypothyroid   . Migraine headache   . Obesity   . Sarcoidosis (HCC)   . Syncope \    Patient Active Problem List   Diagnosis Date Noted  . Anxiety 05/12/2015  . Bipolar 1 disorder, depressed (HCC) 05/12/2015  . Benign essential HTN 05/12/2015  . Benign essential tremor 05/12/2015  . Acid reflux 05/12/2015  . Cephalalgia 05/12/2015  . Hypersomnia with sleep apnea 05/12/2015  . Restless leg 05/12/2015  . Diabetes (HCC) 05/12/2015  . Adult hypothyroidism 05/12/2015  . Migraine variant 05/12/2015  . Chronic migraine without aura 09/03/2012  . 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 Syncope and collapse 03/17/2012  . Depression 03/17/2012  . Migraine 03/17/2012    Past Surgical History:  Procedure Laterality Date  . ABDOMINAL HYSTERECTOMY    . APPENDECTOMY    . BLADDER SURGERY     bladder tact  . CESAREAN SECTION    .  CHOLECYSTECTOMY    . HERNIA REPAIR    . INGUINAL HERNIA REPAIR     rt and left  . lexiscan cardiolite    . TILT TABLE STUDY N/A 04/01/2012   Procedure: TILT TABLE STUDY;  Surgeon: Duke Salvia, MD;  Location: [facility] CATH LAB;  Service: Cardiovascular;  Laterality: N/A;    Prior to Admission medications   Medication Sig Start Date End Date Taking? Authorizing Provider  ALPRAZolam Prudy Feeler) 1 MG tablet Take 1 mg by mouth at bedtime as needed. For sleep   Yes Historical Provider, MD  Asenapine Maleate (SAPHRIS) 10 MG SUBL Place 10 mg under the tongue.   Yes Historical Provider, MD  atorvastatin (LIPITOR) 10 MG tablet Take 10 mg by mouth daily.   Yes Historical Provider, MD  benztropine (COGENTIN) 1 MG tablet Take by mouth.   Yes Historical Provider, MD  buPROPion (WELLBUTRIN XL) 300 MG 24 hr tablet Take 300 mg by mouth daily.   Yes Historical Provider, MD  docusate sodium (COLACE) 100 MG capsule Take by mouth.   Yes Historical Provider, MD  fish oil-omega-3 fatty acids 1000 MG capsule Take 1 g by mouth 2 (two) times daily.   Yes Historical Provider, MD  glimepiride (AMARYL) 2 MG tablet Take 1 mg by mouth at bedtime.  06/12/16  Yes Historical Provider, MD  lamoTRIgine (LAMICTAL) 25 MG tablet Take 25 mg by mouth 2 (two) times daily.    Yes Historical Provider, MD  levothyroxine (SYNTHROID, LEVOTHROID) 150 MCG tablet Take 225 mcg by mouth daily.    Yes Historical Provider, MD  liothyronine (CYTOMEL) 5 MCG tablet TAKE ONE TABLET TWICE DAILY 05/11/16  Yes Historical Provider, MD  modafinil (PROVIGIL) 200 MG tablet Take 200 mg by mouth daily.   Yes Historical Provider, MD  Multiple Vitamin (MULITIVITAMIN WITH MINERALS) TABS Take 1 tablet by mouth daily.   Yes Historical Provider, MD  nadolol (CORGARD) 20 MG tablet Take by mouth at bedtime.  07/29/15 07/28/16 Yes Historical Provider, MD  Nutritional Supplements (MELATONIN PO) Take 10 mg by mouth daily.   Yes Historical Provider, MD  predniSONE (DELTASONE)  10 MG tablet Take 30 mg by mouth daily with breakfast.   Yes Historical Provider, MD  primidone (MYSOLINE) 50 MG tablet Take 100 mg by mouth at bedtime.    Yes Historical Provider, MD  sitaGLIPtin-metformin (JANUMET) 50-1000 MG per tablet TAKE ONE TABLET BY MOUTH TWICE DAILY 04/23/15  Yes Historical Provider, MD  topiramate (TOPAMAX) 200 MG tablet Take 200 mg by mouth 2 (two) times daily.    Yes Historical Provider, MD  Vilazodone HCl (VIIBRYD) 40 MG TABS Take 40 mg by mouth daily.   Yes Historical Provider, MD  conjugated estrogens (PREMARIN) vaginal cream Place 1 Applicatorful vaginally daily. Apply 0.5mg  (pea-sized amount)  just inside the vaginal introitus with a finger-tip every night for two weeks and then Monday, Wednesday and Friday nights. Patient taking differently: Place 1 Applicatorful vaginally daily. Apply 0.5mg  (pea-sized amount)  just inside the vaginal introitus with a finger-tip every night for two weeks and then Monday, Wednesday and Friday nights. 07/17/16   Harle Battiest, PA-C  estradiol (ESTRACE VAGINAL) 0.1 MG/GM vaginal cream Apply 0.5mg  (pea-sized amount)  just inside the vaginal introitus with a finger-tip every night for two weeks and then Monday, Wednesday and Friday nights. Patient taking differently: Apply 0.5mg  (pea-sized amount)  just inside the vaginal introitus with a finger-tip every night for two weeks and then Monday, Wednesday and Friday nights. 07/17/16   Carollee Herter A McGowan, PA-C  glucose blood (ONE TOUCH ULTRA TEST) test strip TEST TWICE A DAY AS DIRECTED 07/15/16   Historical Provider, MD  Letta Pate DELICA LANCETS FINE MISC USE AS DIRECTED 07/15/16   Historical Provider, MD    Allergies Cleocin [clindamycin hcl]  Family History  Problem Relation Age of Onset  . Cancer Father   . Hypertension Mother   . Hyperlipidemia Mother   . Alcohol abuse Brother   . Irritable bowel syndrome Sister   . Kidney cancer Neg Hx   . Kidney disease Neg Hx   . Prostate cancer  Neg Hx     Social History Social History  Substance Use Topics  . Smoking status: Former Smoker    Types: Cigarettes  . Smokeless tobacco: Never Used  . Alcohol use No    Review of Systems Constitutional: No fever/chills Eyes: No visual changes. ENT: No sore throat. Cardiovascular: Denies chest pain. Respiratory: Denies shortness of breath. Gastrointestinal: + abdominal pain.  + nausea, + vomiting.  No diarrhea.  No constipation. Genitourinary: Negative for dysuria. Musculoskeletal: Negative for back pain. Skin: Negative for rash. Neurological: Negative for headaches, focal weakness or numbness.  10-point ROS otherwise negative.  ____________________________________________   PHYSICAL EXAM:  VITAL SIGNS: ED Triage Vitals  Enc Vitals Group     BP 07/24/16 1246 95/70     Pulse Rate 07/24/16 1246 87     Resp 07/24/16 1246 18  Temp 07/24/16 1246 98.1 F (36.7 C)     Temp Source 07/24/16 1246 Oral     SpO2 07/24/16 1246 98 %     Weight 07/24/16 1246 163 lb (73.9 kg)     Height 07/24/16 1246 5\' 4"  (1.626 m)     Head Circumference --      Peak Flow --      Pain Score 07/24/16 1251 6     Pain Loc --      Pain Edu? --      Excl. in GC? --     Constitutional: Alert and oriented. Well appearing and in no acute distress. Eyes: Conjunctivae are normal. PERRL. EOMI. Head: Atraumatic. Nose: No congestion/rhinnorhea. Mouth/Throat: Mucous membranes are moist.  Oropharynx non-erythematous. Neck: No stridor.  Up without meningismus. Cardiovascular: Normal rate, regular rhythm. Grossly normal heart sounds.  Good peripheral circulation. Respiratory: Normal respiratory effort.  No retractions. Lungs CTAB. Gastrointestinal: Soft and nontender. No distention. Normal bowel sounds. No CVA tenderness. Genitourinary: Deferred Musculoskeletal: No lower extremity tenderness nor edema.  No joint effusions. Neurologic:  Normal speech and language. No gross focal neurologic deficits  are appreciated. No gait instability. Skin:  Skin is warm, dry and intact. No rash noted. Psychiatric: Mood and affect are normal. Speech and behavior are normal.  ____________________________________________   LABS (all labs ordered are listed, but only abnormal results are displayed)  Labs Reviewed  COMPREHENSIVE METABOLIC PANEL - Abnormal; Notable for the following:       Result Value   Glucose, Bld 185 (*)    BUN 21 (*)    Creatinine, Ser 2.00 (*)    Calcium 13.2 (*)    ALT 108 (*)    Total Bilirubin 0.2 (*)    GFR calc non Af Amer 28 (*)    GFR calc Af Amer 33 (*)    All other components within normal limits  CBC - Abnormal; Notable for the following:    RBC 3.65 (*)    Hemoglobin 11.2 (*)    HCT 32.2 (*)    All other components within normal limits  MAGNESIUM - Abnormal; Notable for the following:    Magnesium 1.0 (*)    All other components within normal limits   ____________________________________________  EKG  none ____________________________________________  RADIOLOGY  none ____________________________________________   PROCEDURES  Procedure(s) performed: None  Procedures  Critical Care performed: Yes, see critical care note(s)   CRITICAL CARE Performed by: Toney RakesGayle, Jansen Goodpasture A   Total critical care time: 30 minutes  Critical care time was exclusive of separately billable procedures and treating other patients.  Critical care was necessary to treat or prevent imminent or life-threatening deterioration.  Critical care was time spent personally by me on the following activities: development of treatment plan with patient and/or surrogate as well as nursing, discussions with consultants, evaluation of patient's response to treatment, examination of patient, obtaining history from patient or surrogate, ordering and performing treatments and interventions, ordering and review of laboratory studies, ordering and review of radiographic studies, pulse  oximetry and re-evaluation of patient's condition.  ____________________________________________   INITIAL IMPRESSION / ASSESSMENT AND PLAN / ED COURSE  Pertinent labs & imaging results that were available during my care of the patient were reviewed by me and considered in my medical decision making (see chart for details).  Jennifer Bright is a 50 y.o. female with history of sarcoidosis, diabetes, migraines, thyroid disease, bipolar disorder who presents from GI clinic for evaluation of hypercalcemia as  well as intermittent nausea and vomiting over the past 2 weeks. On exam, she is well-appearing and in no acute distress. Vital signs stable, she is afebrile. She has a benign abdominal examination without tenderness, rebound, guarding or rigidity. Labs today confirm hypercalcemia, calcium is 13.2, creatinine is mildly elevated at 2.0. Patient is receiving IV normal saline. Magnesium is also critically low at 1.0, will give magnesium supplementation. CBC shows mild anemia with hemoglobin 11.2. Case discussed with the hospitalist for admission at 3:15 PM.  Clinical Course     ____________________________________________   FINAL CLINICAL IMPRESSION(S) / ED DIAGNOSES  Final diagnoses:  Abdominal pain, unspecified abdominal location  Nausea and vomiting, intractability of vomiting not specified, unspecified vomiting type  Hypercalcemia  Hypomagnesemia      NEW MEDICATIONS STARTED DURING THIS VISIT:  New Prescriptions   No medications on file     Note:  This document was prepared using Dragon voice recognition software and may include unintentional dictation errors.    Gayla Doss, MD 07/24/16 [phone]

## 2016-07-24 NOTE — ED Notes (Signed)
Patient states that she was seen at [facility] this morning due to her calcium being high, patient states that she was told it was much higher today and that she needed to be admitted to get IVF to bring her calcium level down.  Patient states that she has been having abdominal pain for the past 2 weeks, she has also noticed a change in her bowel habits, patient is only going every other day now or every 2 days.

## 2016-07-25 LAB — BASIC METABOLIC PANEL
Anion gap: 4 — ABNORMAL LOW (ref 5–15)
BUN: 22 mg/dL — ABNORMAL HIGH (ref 6–20)
CALCIUM: 11.1 mg/dL — AB (ref 8.9–10.3)
CO2: 26 mmol/L (ref 22–32)
CREATININE: 1.77 mg/dL — AB (ref 0.44–1.00)
Chloride: 115 mmol/L — ABNORMAL HIGH (ref 101–111)
GFR calc Af Amer: 38 mL/min — ABNORMAL LOW (ref >60)
GFR calc non Af Amer: 33 mL/min — ABNORMAL LOW (ref >60)
GLUCOSE: 103 mg/dL — AB (ref 65–99)
Potassium: 2.9 mmol/L — ABNORMAL LOW (ref 3.5–5.1)
Sodium: 145 mmol/L (ref 135–145)

## 2016-07-25 LAB — CBC
HCT: 28.6 % — ABNORMAL LOW (ref 35.0–47.0)
HEMOGLOBIN: 10 g/dL — AB (ref 12.0–16.0)
MCH: 30.7 pg (ref 26.0–34.0)
MCHC: 35 g/dL (ref 32.0–36.0)
MCV: 87.5 fL (ref 80.0–100.0)
PLATELETS: 206 10*3/uL (ref 150–440)
RBC: 3.27 MIL/uL — ABNORMAL LOW (ref 3.80–5.20)
RDW: 13.6 % (ref 11.5–14.5)
WBC: 7.4 10*3/uL (ref 3.6–11.0)

## 2016-07-25 LAB — HEMOGLOBIN A1C
Hgb A1c MFr Bld: 5.2 % (ref 4.8–5.6)
Mean Plasma Glucose: 103 mg/dL

## 2016-07-25 LAB — GLUCOSE, CAPILLARY
GLUCOSE-CAPILLARY: 185 mg/dL — AB (ref 65–99)
GLUCOSE-CAPILLARY: 107 mg/dL — AB (ref 65–99)
Glucose-Capillary: 90 mg/dL (ref 65–99)
Glucose-Capillary: 180 mg/dL — ABNORMAL HIGH (ref 65–99)

## 2016-07-25 LAB — MAGNESIUM: MAGNESIUM: 2 mg/dL (ref 1.7–2.4)

## 2016-07-25 MED ORDER — POTASSIUM CHLORIDE 10 MEQ/100ML IV SOLN
10.0000 meq | INTRAVENOUS | Status: AC
  Administered 2016-07-25 (×4): 10 meq via INTRAVENOUS
  Filled 2016-07-25 (×4): qty 100

## 2016-07-25 MED ORDER — ENOXAPARIN SODIUM 40 MG/0.4ML ~~LOC~~ SOLN
40.0000 mg | SUBCUTANEOUS | Status: DC
  Administered 2016-07-25: 40 mg via SUBCUTANEOUS
  Filled 2016-07-25: qty 0.4

## 2016-07-25 MED ORDER — POTASSIUM CHLORIDE CRYS ER 20 MEQ PO TBCR
40.0000 meq | EXTENDED_RELEASE_TABLET | ORAL | Status: AC
  Administered 2016-07-25 (×2): 40 meq via ORAL
  Filled 2016-07-25 (×2): qty 2

## 2016-07-25 NOTE — Progress Notes (Signed)
[facility] - [place] at [facility]   PATIENT NAME: Jennifer Bright    MR#:  [id number]  DATE OF BIRTH:  [birth year]  SUBJECTIVE:  CHIEF COMPLAINT:   Chief Complaint  Patient presents with  . Abdominal Pain  . Abnormal Lab    she was sent in for hypercalcemia, Acute renal failure.  much improved after IV fluids, no complains.   Still have renal failure. Hypokalemia.  REVIEW OF SYSTEMS:  CONSTITUTIONAL: No fever, fatigue or weakness.  EYES: No blurred or double vision.  EARS, NOSE, AND THROAT: No tinnitus or ear pain.  RESPIRATORY: No cough, shortness of breath, wheezing or hemoptysis.  CARDIOVASCULAR: No chest pain, orthopnea, edema.  GASTROINTESTINAL: No nausea, vomiting, diarrhea or abdominal pain.  GENITOURINARY: No dysuria, hematuria.  ENDOCRINE: No polyuria, nocturia,  HEMATOLOGY: No anemia, easy bruising or bleeding SKIN: No rash or lesion. MUSCULOSKELETAL: No joint pain or arthritis.   NEUROLOGIC: No tingling, numbness, weakness.  PSYCHIATRY: No anxiety or depression.   ROS  DRUG ALLERGIES:   Allergies  Allergen Reactions  . Cleocin [Clindamycin Hcl] Other (See Comments)    GI distress    VITALS:  Blood pressure 110/67, pulse 94, temperature 98.1 F (36.7 C), temperature source Oral, resp. rate 16, height  (1.626 m), weight 80.5 kg (177 lb 5.9 oz), SpO2 97 %.  PHYSICAL EXAMINATION:  GENERAL:  50 y.o.-year-old patient lying in the bed with no acute distress.  EYES: Pupils equal, round, reactive to light and accommodation. No scleral icterus. Extraocular muscles intact.  HEENT: Head atraumatic, normocephalic. Oropharynx and nasopharynx clear.  NECK:  Supple, no jugular venous distention. No thyroid enlargement, no tenderness.  LUNGS: Normal breath sounds bilaterally, no wheezing, rales,rhonchi or crepitation. No use of accessory muscles of respiration.  CARDIOVASCULAR: S1, S2 normal. No murmurs, rubs, or gallops.  ABDOMEN: Soft,  nontender, nondistended. Bowel sounds present. No organomegaly or mass.  EXTREMITIES: No pedal edema, cyanosis, or clubbing.  NEUROLOGIC: Cranial nerves II through XII are intact. Muscle strength 5/5 in all extremities. Sensation intact. Gait not checked.  PSYCHIATRIC: The patient is alert and oriented x 3.  SKIN: No obvious rash, lesion, or ulcer.   Physical Exam LABORATORY PANEL:   CBC  Recent Labs Lab 07/25/16 0337  WBC 7.4  HGB 10.0*  HCT 28.6*  PLT 206   ------------------------------------------------------------------------------------------------------------------  Chemistries   Recent Labs Lab 07/24/16 1255 07/25/16 0337  NA 139 145  K 4.0 2.9*  CL 106 115*  CO2 23 26  GLUCOSE 185* 103*  BUN 21* 22*  CREATININE 2.00* 1.77*  CALCIUM 13.2* 11.1*  MG 1.0* 2.0  AST 34  --   ALT 108*  --   ALKPHOS 111  --   BILITOT 0.2*  --    ------------------------------------------------------------------------------------------------------------------  Cardiac Enzymes No results for input(s): TROPONINI in the last 168 hours. ------------------------------------------------------------------------------------------------------------------  RADIOLOGY:  No results found.  ASSESSMENT AND PLAN:   Active Problems:   Hypercalcemia  * Acute kidney injury due to dehydration from vomiting. Vomiting has resolved. Fluid resuscitated with IV fluid bolus. continue maintenance normal saline. Monitor input and output. Repeat labs in the morning shows some inprovement.  * Hypercalcemia due to sarcoidosis worsened by dehydration Fluid bolus and IV fluids. Repeat labs in the morning shows improved.  * Hypomagnesemia due to decreased intake and vomiting Replace through IV and check tomorrow  Total 4 g IV, corrected now.  * hypokalemia   Replace IV and oral and check.  * Sarcoidosis  Continue prednisone from home. Follows with Dr. Meredeth Ide as outpatient.  * DVT  prophylaxis with Lovenox.   All the records are reviewed and case discussed with Care Management/Social Workerr. Management plans discussed with the patient, family and they are in agreement.  CODE STATUS: Full  TOTAL TIME TAKING CARE OF THIS PATIENT: 35 minutes.   Discussed with her husband also in room.  POSSIBLE D/C IN 1-2 DAYS, DEPENDING ON CLINICAL CONDITION.   Altamese Dilling M.D on 07/25/2016   Between 7am to 6pm - Pager - [phone]  After 6pm go to [email or web address] - password [email or web address]  [facility]  Office  [phone]  CC: Primary care physician; Dorothey Baseman, MD  Note: This dictation was prepared with Dragon dictation along with smaller phrase technology. Any transcriptional errors that result from this process are unintentional.

## 2016-07-25 NOTE — Evaluation (Signed)
Physical Therapy Evaluation Patient Details Name: Jennifer Bright MRN: [id number] DOB: [birth year] Today's Date: 07/25/2016   History of Present Illness  50 y.o. female with a known history of Sarcoidosis, gastroparesis, diabetes parents to the emergency room due to hypercalcemia sent in by her endocrinologist. And has had on and off vomiting for 2 weeks.    Clinical Impression  Pt generally did well with mobility and ambulation and was able to negotiate steps and walk >200 ft w/o issue.  She does not do a lot on her own in the community secondary to her issues with blacking out but overall she is safe and should be able to return home at her baseline.  Pt did well and shows good confidence with mobility, eager to go home.     Follow Up Recommendations No PT follow up    Equipment Recommendations       Recommendations for Other Services       Precautions / Restrictions Restrictions Weight Bearing Restrictions: No      Mobility  Bed Mobility Overal bed mobility: Independent             General bed mobility comments: no issues getting herself up to sitting at EOB  Transfers Overall transfer level: Independent Equipment used: None             General transfer comment: Pt able to rise and maintain balance w/o issue  Ambulation/Gait Ambulation/Gait assistance: Independent Ambulation Distance (Feet): 250 Feet Assistive device: None       General Gait Details: Pt walks with good speed and confidence and reports that she feels that she is at her baseline.    Stairs Stairs: Yes Stairs assistance: Modified independent (Device/Increase time) Stair Management: One rail Right;Alternating pattern   General stair comments: Pt walks   Wheelchair Mobility    Modified Rankin (Stroke Patients Only)       Balance Overall balance assessment: Independent                                           Pertinent Vitals/Pain Pain Assessment: No/denies  pain    Home Living Family/patient expects to be discharged to:: Private residence Living Arrangements: Spouse/significant other   Type of Home: House Home Access: Stairs to enter Entrance Stairs-Rails: None (typically holds husbands arm) Entrance Stairs-Number of Steps: 3   Home Equipment: Walker - 2 wheels;Walker - 4 wheels;Cane - single point      Prior Function Level of Independence: Independent         Comments: Pt does not drive secondary migraine/blacking-out condition     Hand Dominance        Extremity/Trunk Assessment   Upper Extremity Assessment: Overall WFL for tasks assessed           Lower Extremity Assessment: Overall WFL for tasks assessed (b/l hips grossly weaker than rest of LEs `with 3+/5)         Communication   Communication: No difficulties  Cognition Arousal/Alertness: Awake/alert Behavior During Therapy: WFL for tasks assessed/performed Overall Cognitive Status: Within Functional Limits for tasks assessed                      General Comments      Exercises     Assessment/Plan    PT Assessment Patent does not need any further PT services  PT Problem List  PT Treatment Interventions      PT Goals (Current goals can be found in the Care Plan section)  Acute Rehab PT Goals Patient Stated Goal: go home today    Frequency     Barriers to discharge        Co-evaluation               End of Session Equipment Utilized During Treatment: Gait belt Activity Tolerance: Patient tolerated treatment well Patient left: with call bell/phone within reach;in chair;with family/visitor present Nurse Communication: Mobility status         Time: [id number] PT Time Calculation (min) (ACUTE ONLY): 19 min   Charges:   PT Evaluation $PT Eval Low Complexity: 1 Procedure     PT G CodesMalachi Bright:        Jennifer Bright, DPT 07/25/2016, 11:16 AM

## 2016-07-25 NOTE — Progress Notes (Signed)
Pt's potassium level today=2.9. On call Dr Sheryle Hailiamond paged and ordered for potassium replacements IV and PO. Will administer as ordered.

## 2016-07-26 LAB — GLUCOSE, CAPILLARY: Glucose-Capillary: 173 mg/dL — ABNORMAL HIGH (ref 65–99)

## 2016-07-26 LAB — BASIC METABOLIC PANEL
ANION GAP: 3 — AB (ref 5–15)
BUN: 17 mg/dL (ref 6–20)
CHLORIDE: 120 mmol/L — AB (ref 101–111)
CO2: 21 mmol/L — AB (ref 22–32)
Calcium: 10.3 mg/dL (ref 8.9–10.3)
Creatinine, Ser: 1.52 mg/dL — ABNORMAL HIGH (ref 0.44–1.00)
GFR calc non Af Amer: 39 mL/min — ABNORMAL LOW (ref >60)
GFR, EST AFRICAN AMERICAN: 45 mL/min — AB (ref >60)
Glucose, Bld: 114 mg/dL — ABNORMAL HIGH (ref 65–99)
POTASSIUM: 3.9 mmol/L (ref 3.5–5.1)
Sodium: 144 mmol/L (ref 135–145)

## 2016-07-26 LAB — HEPATIC FUNCTION PANEL
ALBUMIN: 2.8 g/dL — AB (ref 3.5–5.0)
ALK PHOS: 80 U/L (ref 38–126)
ALT: 50 U/L (ref 14–54)
AST: 14 U/L — AB (ref 15–41)
BILIRUBIN TOTAL: 0.5 mg/dL (ref 0.3–1.2)
Bilirubin, Direct: <0.1 mg/dL — ABNORMAL LOW (ref 0.1–0.5)
Total Protein: 5.5 g/dL — ABNORMAL LOW (ref 6.5–8.1)

## 2016-07-26 NOTE — Discharge Summary (Signed)
[facility] - [place] at [facility]   PATIENT NAME: Jennifer LeydenMichele Bright    MR#:  [id number]  DATE OF BIRTH:  02/04/1966  DATE OF ADMISSION:  07/24/2016 ADMITTING PHYSICIAN: Jennifer LollSrikar Sudini, MD  DATE OF DISCHARGE: 07/26/2016  PRIMARY CARE PHYSICIAN: Jennifer BasemanAVID BRONSTEIN, MD    ADMISSION DIAGNOSIS:  Hypercalcemia [E83.52] Hypomagnesemia [E83.42] Abdominal pain, unspecified abdominal location [R10.9] Nausea and vomiting, intractability of vomiting not specified, unspecified vomiting type [R11.2]  DISCHARGE DIAGNOSIS:  Active Problems:   Hypercalcemia   SECONDARY DIAGNOSIS:   Past Medical History:  Diagnosis Date  . Acid reflux   . Anxiety   . Depression   . Diabetes mellitus    NIDD  . Hyperlipidemia   . Hypothyroid   . Migraine headache   . Obesity   . Sarcoidosis (HCC)   . Syncope \    HOSPITAL COURSE:   * Acute kidney injury due to dehydration from vomiting. Vomiting has resolved. Fluid resuscitated with IV fluid bolus. continue maintenance normal saline. Monitor input and output. Repeat labs in the morning shows some inprovement.  advised to follow with nephrologist in office.  * Hypercalcemia due to sarcoidosis worsened by dehydration Fluid bolus and IV fluids. Repeat labs in the morning shows improved.  * Hypomagnesemia due to decreased intake and vomiting Replace through IV and check. Total 4 g IV, corrected now.  * hypokalemia   Replace IV and oral and check.  * Sarcoidosis Continue prednisone from home. Follows with Dr. Meredeth Bright as outpatient.  * DVT prophylaxis with Lovenox.  DISCHARGE CONDITIONS:   Stable.  CONSULTS OBTAINED:    DRUG ALLERGIES:   Allergies  Allergen Reactions  . Cleocin [Clindamycin Hcl] Other (See Comments)    GI distress   DISCHARGE MEDICATIONS:   Current Discharge Medication List    CONTINUE these medications which have NOT CHANGED   Details  ALPRAZolam (XANAX) 1 MG tablet Take 1 mg by  mouth at bedtime as needed. For sleep    Asenapine Maleate (SAPHRIS) 10 MG SUBL Place 10 mg under the tongue.    atorvastatin (LIPITOR) 10 MG tablet Take 10 mg by mouth daily.    benztropine (COGENTIN) 1 MG tablet Take by mouth.    buPROPion (WELLBUTRIN XL) 300 MG 24 hr tablet Take 300 mg by mouth daily.    docusate sodium (COLACE) 100 MG capsule Take by mouth.    fish oil-omega-3 fatty acids 1000 MG capsule Take 1 g by mouth 2 (two) times daily.    glimepiride (AMARYL) 2 MG tablet Take 1 mg by mouth at bedtime.     lamoTRIgine (LAMICTAL) 25 MG tablet Take 25 mg by mouth 2 (two) times daily.     levothyroxine (SYNTHROID, LEVOTHROID) 150 MCG tablet Take 225 mcg by mouth daily.     liothyronine (CYTOMEL) 5 MCG tablet TAKE ONE TABLET TWICE DAILY    modafinil (PROVIGIL) 200 MG tablet Take 200 mg by mouth daily.    Multiple Vitamin (MULITIVITAMIN WITH MINERALS) TABS Take 1 tablet by mouth daily.    nadolol (CORGARD) 20 MG tablet Take by mouth at bedtime.     Nutritional Supplements (MELATONIN PO) Take 10 mg by mouth daily.    predniSONE (DELTASONE) 10 MG tablet Take 30 mg by mouth daily with breakfast.    primidone (MYSOLINE) 50 MG tablet Take 100 mg by mouth at bedtime.     sitaGLIPtin-metformin (JANUMET) 50-1000 MG per tablet TAKE ONE TABLET BY MOUTH TWICE DAILY    topiramate (TOPAMAX)  200 MG tablet Take 200 mg by mouth 2 (two) times daily.     Vilazodone HCl (VIIBRYD) 40 MG TABS Take 40 mg by mouth daily.    conjugated estrogens (PREMARIN) vaginal cream Place 1 Applicatorful vaginally daily. Apply 0.5mg  (pea-sized amount)  just inside the vaginal introitus with a finger-tip every night for two weeks and then Monday, Wednesday and Friday nights. Qty: 30 g, Refills: 12    estradiol (ESTRACE VAGINAL) 0.1 MG/GM vaginal cream Apply 0.5mg  (pea-sized amount)  just inside the vaginal introitus with a finger-tip every night for two weeks and then Monday, Wednesday and Friday  nights. Qty: 30 g, Refills: 12    glucose blood (ONE TOUCH ULTRA TEST) test strip TEST TWICE A DAY AS DIRECTED    ONETOUCH DELICA LANCETS FINE MISC USE AS DIRECTED         DISCHARGE INSTRUCTIONS:    Follow with nephrologist in one week.  If you experience worsening of your admission symptoms, develop shortness of breath, life threatening emergency, suicidal or homicidal thoughts you must seek medical attention immediately by calling 911 or calling your MD immediately  if symptoms less severe.  You Must read complete instructions/literature along with all the possible adverse reactions/side effects for all the Medicines you take and that have been prescribed to you. Take any new Medicines after you have completely understood and accept all the possible adverse reactions/side effects.   Please note  You were cared for by a hospitalist during your hospital stay. If you have any questions about your discharge medications or the care you received while you were in the hospital after you are discharged, you can call the unit and asked to speak with the hospitalist on call if the hospitalist that took care of you is not available. Once you are discharged, your primary care physician will handle any further medical issues. Please note that NO REFILLS for any discharge medications will be authorized once you are discharged, as it is imperative that you return to your primary care physician (or establish a relationship with a primary care physician if you do not have one) for your aftercare needs so that they can reassess your need for medications and monitor your lab values.    Today   CHIEF COMPLAINT:   Chief Complaint  Patient presents with  . Abdominal Pain  . Abnormal Lab    HISTORY OF PRESENT ILLNESS:  Jennifer Bright  is a 50 y.o. female with a known history of Sarcoidosis, gastroparesis, diabetes parents to the emergency room due to hypercalcemia sent in by her endocrinologist.  And has had on and off vomiting for 2 weeks which resolved yesterday. Mild abdominal pain which is resolved. Her calcium was found to be at 14. Magnesium 1. Being admitted due to acute kidney injury with creatinine of 2, hypercalcemia and hypomagnesemia.  Patient has sarcoidosis diagnosed 3 years back and is on prednisone and follows with pulmonary Dr. Meredeth Ide.  VITAL SIGNS:  Blood pressure 110/66, pulse 78, temperature 98.2 F (36.8 C), temperature source Oral, resp. rate 20, height 5\' 4"  (1.626 m), weight 80.5 kg (177 lb 5.9 oz), SpO2 100 %.  I/O:   Intake/Output Summary (Last 24 hours) at 07/26/16 1402 Last data filed at 07/26/16 0400  Gross per 24 hour  Intake          2809.58 ml  Output                0 ml  Net  2809.58 ml    PHYSICAL EXAMINATION:   GENERAL:  50 y.o.-year-old patient lying in the bed with no acute distress.  EYES: Pupils equal, round, reactive to light and accommodation. No scleral icterus. Extraocular muscles intact.  HEENT: Head atraumatic, normocephalic. Oropharynx and nasopharynx clear.  NECK:  Supple, no jugular venous distention. No thyroid enlargement, no tenderness.  LUNGS: Normal breath sounds bilaterally, no wheezing, rales,rhonchi or crepitation. No use of accessory muscles of respiration.  CARDIOVASCULAR: S1, S2 normal. No murmurs, rubs, or gallops.  ABDOMEN: Soft, nontender, nondistended. Bowel sounds present. No organomegaly or mass.  EXTREMITIES: No pedal edema, cyanosis, or clubbing.  NEUROLOGIC: Cranial nerves II through XII are intact. Muscle strength 5/5 in all extremities. Sensation intact. Gait not checked.  PSYCHIATRIC: The patient is alert and oriented x 3.  SKIN: No obvious rash, lesion, or ulcer.    DATA REVIEW:   CBC  Recent Labs Lab 07/25/16 0337  WBC 7.4  HGB 10.0*  HCT 28.6*  PLT 206    Chemistries   Recent Labs Lab 07/25/16 0337 07/26/16 0342  NA 145 144  K 2.9* 3.9  CL 115* 120*  CO2 26 21*   GLUCOSE 103* 114*  BUN 22* 17  CREATININE 1.77* 1.52*  CALCIUM 11.1* 10.3  MG 2.0  --   AST  --  14*  ALT  --  50  ALKPHOS  --  80  BILITOT  --  0.5    Cardiac Enzymes No results for input(s): TROPONINI in the last 168 hours.  Microbiology Results  Results for orders placed or performed in visit on 05/16/14  Urine culture     Status: None   Collection Time: 05/16/14  6:38 PM  Result Value Ref Range Status   Micro Text Report   Final       SOURCE: CLEAN CATCH    COMMENT                   MIXED BACTERIAL ORGANISMS   COMMENT                   RESULTS SUGGESTIVE OF CONTAMINATION   ANTIBIOTIC                                                        RADIOLOGY:  No results found.  EKG:   Orders placed or performed in visit on 03/31/12  . Cardiac event monitor      Management plans discussed with the patient, family and they are in agreement.  CODE STATUS:     Code Status Orders        Start     Ordered   07/24/16 1556  Full code  Continuous     07/24/16 1556    Code Status History    Date Active Date Inactive Code Status Order ID Comments User Context   This patient has a current code status but no historical code status.      TOTAL TIME TAKING CARE OF THIS PATIENT: 35 minutes.    Altamese Dilling M.D on 07/26/2016 at 2:02 PM  Between 7am to 6pm - Pager - [phone]  After 6pm go to [email or web address] - password [email or web address]  [facility]  Office  [phone]  CC: Primary care physician; Jennifer Baseman, MD   Note: This  dictation was prepared with Dragon dictation along with smaller phrase technology. Any transcriptional errors that result from this process are unintentional.

## 2016-08-14 DIAGNOSIS — D869 Sarcoidosis, unspecified: Secondary | ICD-10-CM | POA: Insufficient documentation

## 2016-08-14 DIAGNOSIS — D860 Sarcoidosis of lung: Secondary | ICD-10-CM | POA: Insufficient documentation

## 2016-08-14 DIAGNOSIS — Z79899 Other long term (current) drug therapy: Secondary | ICD-10-CM | POA: Insufficient documentation

## 2016-08-27 ENCOUNTER — Encounter: Payer: Self-pay | Admitting: *Deleted

## 2016-08-28 ENCOUNTER — Ambulatory Visit
Admission: RE | Admit: 2016-08-28 | Discharge: 2016-08-28 | Disposition: A | Discharge status: Home / Self Care | Payer: BLUE CROSS/BLUE SHIELD | Source: Ambulatory Visit | Attending: Gastroenterology | Admitting: Gastroenterology

## 2016-08-28 ENCOUNTER — Ambulatory Visit: Payer: BLUE CROSS/BLUE SHIELD | Admitting: Anesthesiology

## 2016-08-28 ENCOUNTER — Encounter
Admission: RE | Disposition: A | Discharge status: Home / Self Care | Payer: Self-pay | Source: Ambulatory Visit | Attending: Gastroenterology

## 2016-08-28 DIAGNOSIS — K2970 Gastritis, unspecified, without bleeding: Secondary | ICD-10-CM | POA: Diagnosis not present

## 2016-08-28 DIAGNOSIS — K589 Irritable bowel syndrome without diarrhea: Secondary | ICD-10-CM | POA: Insufficient documentation

## 2016-08-28 DIAGNOSIS — Z79899 Other long term (current) drug therapy: Secondary | ICD-10-CM | POA: Insufficient documentation

## 2016-08-28 DIAGNOSIS — Z6828 Body mass index (BMI) 28.0-28.9, adult: Secondary | ICD-10-CM | POA: Insufficient documentation

## 2016-08-28 DIAGNOSIS — Z87891 Personal history of nicotine dependence: Secondary | ICD-10-CM | POA: Insufficient documentation

## 2016-08-28 DIAGNOSIS — G43909 Migraine, unspecified, not intractable, without status migrainosus: Secondary | ICD-10-CM | POA: Diagnosis not present

## 2016-08-28 DIAGNOSIS — K2980 Duodenitis without bleeding: Secondary | ICD-10-CM | POA: Insufficient documentation

## 2016-08-28 DIAGNOSIS — E669 Obesity, unspecified: Secondary | ICD-10-CM | POA: Insufficient documentation

## 2016-08-28 DIAGNOSIS — K920 Hematemesis: Secondary | ICD-10-CM | POA: Diagnosis present

## 2016-08-28 DIAGNOSIS — E785 Hyperlipidemia, unspecified: Secondary | ICD-10-CM | POA: Diagnosis not present

## 2016-08-28 DIAGNOSIS — I10 Essential (primary) hypertension: Secondary | ICD-10-CM | POA: Diagnosis not present

## 2016-08-28 DIAGNOSIS — F419 Anxiety disorder, unspecified: Secondary | ICD-10-CM | POA: Insufficient documentation

## 2016-08-28 DIAGNOSIS — D869 Sarcoidosis, unspecified: Secondary | ICD-10-CM | POA: Insufficient documentation

## 2016-08-28 DIAGNOSIS — Z7952 Long term (current) use of systemic steroids: Secondary | ICD-10-CM | POA: Diagnosis not present

## 2016-08-28 DIAGNOSIS — F319 Bipolar disorder, unspecified: Secondary | ICD-10-CM | POA: Diagnosis not present

## 2016-08-28 DIAGNOSIS — K319 Disease of stomach and duodenum, unspecified: Secondary | ICD-10-CM | POA: Insufficient documentation

## 2016-08-28 DIAGNOSIS — E119 Type 2 diabetes mellitus without complications: Secondary | ICD-10-CM | POA: Diagnosis not present

## 2016-08-28 DIAGNOSIS — Z79890 Hormone replacement therapy (postmenopausal): Secondary | ICD-10-CM | POA: Insufficient documentation

## 2016-08-28 DIAGNOSIS — K210 Gastro-esophageal reflux disease with esophagitis: Secondary | ICD-10-CM | POA: Insufficient documentation

## 2016-08-28 DIAGNOSIS — K2210 Ulcer of esophagus without bleeding: Secondary | ICD-10-CM | POA: Diagnosis not present

## 2016-08-28 DIAGNOSIS — Z7984 Long term (current) use of oral hypoglycemic drugs: Secondary | ICD-10-CM | POA: Diagnosis not present

## 2016-08-28 DIAGNOSIS — E039 Hypothyroidism, unspecified: Secondary | ICD-10-CM | POA: Insufficient documentation

## 2016-08-28 HISTORY — DX: Essential tremor: G25.0

## 2016-08-28 HISTORY — DX: Other specified disorders of bone density and structure, unspecified site: M85.80

## 2016-08-28 HISTORY — DX: Bipolar disorder, unspecified: F31.9

## 2016-08-28 HISTORY — DX: Irritable bowel syndrome, unspecified: K58.9

## 2016-08-28 HISTORY — PX: ESOPHAGOGASTRODUODENOSCOPY (EGD) WITH PROPOFOL: SHX5813

## 2016-08-28 HISTORY — DX: Sleep apnea, unspecified: G47.30

## 2016-08-28 HISTORY — DX: Anemia, unspecified: D64.9

## 2016-08-28 HISTORY — DX: Restless legs syndrome: G25.81

## 2016-08-28 LAB — GLUCOSE, CAPILLARY: GLUCOSE-CAPILLARY: 146 mg/dL — AB (ref 65–99)

## 2016-08-28 SURGERY — ESOPHAGOGASTRODUODENOSCOPY (EGD) WITH PROPOFOL
Anesthesia: General

## 2016-08-28 MED ORDER — PROPOFOL 500 MG/50ML IV EMUL
INTRAVENOUS | Status: DC | PRN
  Administered 2016-08-28: 150 ug/kg/min via INTRAVENOUS

## 2016-08-28 MED ORDER — SODIUM CHLORIDE 0.9 % IV SOLN
INTRAVENOUS | Status: DC
  Administered 2016-08-28: 12:00:00 via INTRAVENOUS
  Administered 2016-08-28: 1000 mL via INTRAVENOUS

## 2016-08-28 MED ORDER — PROPOFOL 10 MG/ML IV BOLUS
INTRAVENOUS | Status: DC | PRN
  Administered 2016-08-28 (×2): 50 mg via INTRAVENOUS

## 2016-08-28 MED ORDER — FENTANYL CITRATE (PF) 100 MCG/2ML IJ SOLN
INTRAMUSCULAR | Status: DC | PRN
  Administered 2016-08-28: 50 ug via INTRAVENOUS

## 2016-08-28 MED ORDER — MIDAZOLAM HCL 2 MG/2ML IJ SOLN
INTRAMUSCULAR | Status: DC | PRN
Start: 2016-08-28 — End: 2016-08-28
  Administered 2016-08-28: 1 mg via INTRAVENOUS

## 2016-08-28 MED ORDER — SODIUM CHLORIDE 0.9 % IV SOLN: INTRAVENOUS | Status: DC

## 2016-08-28 NOTE — Anesthesia Postprocedure Evaluation (Signed)
Anesthesia Post Note  Patient: Jennifer Bright  Procedure(s) Performed: Procedure(s) (LRB): ESOPHAGOGASTRODUODENOSCOPY (EGD) WITH PROPOFOL (N/A)  Patient location during evaluation: Endoscopy Anesthesia Type: General Level of consciousness: awake and alert Pain management: pain level controlled Vital Signs Assessment: post-procedure vital signs reviewed and stable Respiratory status: spontaneous breathing, nonlabored ventilation and respiratory function stable Cardiovascular status: blood pressure returned to baseline and stable Postop Assessment: no signs of nausea or vomiting Anesthetic complications: no    Last Vitals:  Vitals:   08/28/16 1050 08/28/16 1208  BP: 109/72 (!) 88/62  Pulse: 83 85  Resp: 16 (!) 21  Temp: 37.3 C (!) 36 C    Last Pain:  Vitals:   08/28/16 1208  TempSrc: Tympanic                 Emaree Chiu

## 2016-08-28 NOTE — Op Note (Signed)
[facility] Gastroenterology Patient Name: Jennifer LeydenMichele Eckerman Procedure Date: 08/28/2016 11:44 AM MRN: [id number] Account #: [id number] Date of Birth: 04/22/1966 Admit Type: Outpatient Age: [age] Room: [facility] ENDO ROOM 4 Gender: Female Note Status: Finalized Procedure:            Upper GI endoscopy Indications:          Hematemesis, Nausea with vomiting Providers:            Christena DeemMartin U. Tynetta Bachmann, MD Referring MD:         Teena Iraniavid M. Terance HartBronstein, MD (Referring MD) Medicines:            Monitored Anesthesia Care Complications:        No immediate complications. Procedure:            Pre-Anesthesia Assessment:                       - ASA Grade Assessment: III - A patient with severe                        systemic disease.                       After obtaining informed consent, the endoscope was                        passed under direct vision. Throughout the procedure,                        the patient's blood pressure, pulse, and oxygen                        saturations were monitored continuously. The Endoscope                        was introduced through the mouth, and advanced to the                        third part of duodenum. The upper GI endoscopy was                        accomplished without difficulty. The patient tolerated                        the procedure well. Findings:      LA Grade B (one or more mucosal breaks greater than 5 mm, not extending       between the tops of two mucosal folds) esophagitis with no bleeding was       found. Biopsies were taken with a cold forceps for histology.      Diffuse mild inflammation characterized by congestion (edema) and       erythema was found in the gastric body. Biopsies were taken with a cold       forceps for histology.      The cardia and gastric fundus were normal on retroflexion otherwise.      Diffuse mild mucosal variance characterized by altered texture was found       in the second portion of the duodenum  and in the third portion of the       duodenum. Biopsies were taken with a cold forceps for histology. Impression:           -  LA Grade B erosive esophagitis. Biopsied.                       - Gastritis. Biopsied.                       - Mucosal variant in the duodenum. Biopsied. Recommendation:       - Discharge patient to home.                       - Use Protonix (pantoprazole) 40 mg PO daily daily.                       - Await pathology results.                       - Return to GI office in 3 weeks, further f/u of liver                        testing. Procedure Code(s):    --- Professional ---                       [phone] [id number], Esophagogastroduodenoscopy, flexible, transoral;                        with biopsy, single or multiple Diagnosis Code(s):    --- Professional ---                       K20.8, Other esophagitis                       K29.70, Gastritis, unspecified, without bleeding                       K31.89, Other diseases of stomach and duodenum                       K92.0, Hematemesis                       R11.2, Nausea with vomiting, unspecified CPT copyright 2016 [employer]. All rights reserved. The codes documented in this report are preliminary and upon coder review may  be revised to meet current compliance requirements. Christena DeemMartin U Jule Schlabach, MD 08/28/2016 12:08:52 PM This report has been signed electronically. Number of Addenda: 0 Note Initiated On: 08/28/2016 11:44 AM      [facility]

## 2016-08-28 NOTE — Anesthesia Preprocedure Evaluation (Signed)
Anesthesia Evaluation  Patient identified by MRN, date of birth, ID band Patient awake    Reviewed: Allergy & Precautions, NPO status , Patient's Chart, lab work & pertinent test results  History of Anesthesia Complications Negative for: history of anesthetic complications  Airway Mallampati: II  TM Distance: >3 FB Neck ROM: Full    Dental no notable dental hx.    Pulmonary sleep apnea , neg COPD, former smoker,    breath sounds clear to auscultation- rhonchi (-) wheezing      Cardiovascular hypertension, Pt. on medications (-) CAD and (-) Past MI  Rhythm:Regular Rate:Normal - Systolic murmurs and - Diastolic murmurs    Neuro/Psych  Headaches, Anxiety Depression Bipolar Disorder    GI/Hepatic Neg liver ROS, GERD  ,  Endo/Other  diabetes, Type 2, Oral Hypoglycemic AgentsHypothyroidism   Renal/GU negative Renal ROS     Musculoskeletal   Abdominal (+) - obese,   Peds  Hematology  (+) anemia ,   Anesthesia Other Findings Past Medical History: No date: Acid reflux No date: Anemia No date: Anxiety No date: Bipolar disorder (HCC) No date: Depression No date: Diabetes mellitus     Comment: NIDD No date: Essential tremor No date: Hyperlipidemia No date: Hypertension No date: Hypothyroid No date: IBS (irritable bowel syndrome) No date: Migraine headache     Comment: vestibular migraine No date: Obesity No date: Osteopenia No date: Restless leg syndrome No date: Sarcoidosis (HCC) No date: Sleep apnea \: Syncope   Reproductive/Obstetrics                             Anesthesia Physical Anesthesia Plan  ASA: II  Anesthesia Plan: General   Post-op Pain Management:    Induction: Intravenous  Airway Management Planned: Natural Airway  Additional Equipment:   Intra-op Plan:   Post-operative Plan:   Informed Consent: I have reviewed the patients History and Physical, chart, labs  and discussed the procedure including the risks, benefits and alternatives for the proposed anesthesia with the patient or authorized representative who has indicated his/her understanding and acceptance.   Dental advisory given  Plan Discussed with: CRNA and Anesthesiologist  Anesthesia Plan Comments:         Anesthesia Quick Evaluation

## 2016-08-28 NOTE — Anesthesia Procedure Notes (Signed)
Date/Time: 08/28/2016 11:47 AM Performed by: Junious SilkNOLES, Jennifer Kirley Pre-anesthesia Checklist: Patient identified, Emergency Drugs available, Suction available, Patient being monitored and Timeout performed Oxygen Delivery Method: Nasal cannula

## 2016-08-28 NOTE — H&P (Signed)
Outpatient short stay form Pre-procedure 08/28/2016 11:29 AM Jennifer DeemMartin U Skulskie MD  Primary Physician: Dr. Dorothey Basemanavid Bronstein, Dr. Geanie LoganPaul Bennett  Reason for visit:  EGD  History of present illness:  Patient is a 50 year old female presenting today with complaint of nausea and emesis and evidence of blood in the emesis. She seen no blood in her emesis for about 2 weeks and his had no black bowel movements however continues to have daily nausea. She has been started on a proton pump inhibitor about a month ago. She does have a history of sarcoidosis.  Patient also has a history of some lower abdominal discomfort. This is variable in intensity and onset. It is not associated with bowel movements or bowel habits. It does not decrease after a bowel movement. She has had a colonoscopy within the past couple of years that was negative. She has not had a abdominal CT scan for several years.    Current Facility-Administered Medications:  .  0.9 %  sodium chloride infusion, , Intravenous, Continuous, Jennifer DeemMartin U Skulskie, MD, Last Rate: 20 mL/hr at 08/28/16 1102, 1,000 mL at 08/28/16 1102 .  0.9 %  sodium chloride infusion, , Intravenous, Continuous, Jennifer DeemMartin U Skulskie, MD  Prescriptions Prior to Admission  Medication Sig Dispense Refill Last Dose  . ALPRAZolam (XANAX) 1 MG tablet Take 1 mg by mouth at bedtime as needed. For sleep   08/27/2016 at Unknown time  . Asenapine Maleate (SAPHRIS) 10 MG SUBL Place 10 mg under the tongue.   08/27/2016 at Unknown time  . atorvastatin (LIPITOR) 40 MG tablet Take 40 mg by mouth daily.    08/27/2016 at Unknown time  . benztropine (COGENTIN) 1 MG tablet Take by mouth 2 (two) times daily.    08/27/2016 at Unknown time  . buPROPion (WELLBUTRIN XL) 300 MG 24 hr tablet Take 300 mg by mouth daily.   08/27/2016 at Unknown time  . conjugated estrogens (PREMARIN) vaginal cream Place 1 Applicatorful vaginally daily. Apply 0.5mg  (pea-sized amount)  just inside the vaginal introitus with a  finger-tip every night for two weeks and then Monday, Wednesday and Friday nights. (Patient taking differently: Place 1 Applicatorful vaginally daily. Apply 0.5mg  (pea-sized amount)  just inside the vaginal introitus with a finger-tip every night for two weeks and then Monday, Wednesday and Friday nights.) 30 g 12 08/27/2016 at Unknown time  . docusate sodium (COLACE) 100 MG capsule Take by mouth.   08/27/2016 at Unknown time  . estradiol (ESTRACE VAGINAL) 0.1 MG/GM vaginal cream Apply 0.5mg  (pea-sized amount)  just inside the vaginal introitus with a finger-tip every night for two weeks and then Monday, Wednesday and Friday nights. (Patient taking differently: Apply 0.5mg  (pea-sized amount)  just inside the vaginal introitus with a finger-tip every night for two weeks and then Monday, Wednesday and Friday nights.) 30 g 12 08/27/2016 at Unknown time  . fish oil-omega-3 fatty acids 1000 MG capsule Take 1 g by mouth 2 (two) times daily.   Past Week at Unknown time  . furosemide (LASIX) 20 MG tablet Take 20 mg by mouth daily.   08/27/2016 at Unknown time  . glimepiride (AMARYL) 2 MG tablet Take 1 mg by mouth at bedtime.    Past Week at Unknown time  . glucose blood (ONE TOUCH ULTRA TEST) test strip TEST TWICE A DAY AS DIRECTED   08/27/2016 at Unknown time  . hyoscyamine (LEVSIN, ANASPAZ) 0.125 MG tablet Take 0.125 mg by mouth every 6 (six) hours as needed for cramping.  08/27/2016 at Unknown time  . lamoTRIgine (LAMICTAL) 25 MG tablet Take 25 mg by mouth 3 (three) times daily.    08/27/2016 at Unknown time  . levothyroxine (SYNTHROID, LEVOTHROID) 150 MCG tablet Take 150 mcg by mouth daily.    08/27/2016 at Unknown time  . liothyronine (CYTOMEL) 5 MCG tablet TAKE ONE TABLET TWICE DAILY   08/27/2016 at Unknown time  . modafinil (PROVIGIL) 200 MG tablet Take 200 mg by mouth daily.   08/27/2016 at Unknown time  . Multiple Vitamin (MULITIVITAMIN WITH MINERALS) TABS Take 1 tablet by mouth daily.   Past Week at Unknown  time  . Nutritional Supplements (MELATONIN PO) Take 10 mg by mouth daily.   Past Week at Unknown time  . ONETOUCH DELICA LANCETS FINE MISC USE AS DIRECTED   08/27/2016 at Unknown time  . predniSONE (DELTASONE) 10 MG tablet Take 30 mg by mouth daily with breakfast.   08/27/2016 at Unknown time  . primidone (MYSOLINE) 50 MG tablet Take 100 mg by mouth at bedtime.    08/27/2016 at Unknown time  . propranolol (INDERAL) 60 MG tablet Take 60 mg by mouth 2 (two) times daily.   08/27/2016 at Unknown time  . sitaGLIPtin-metformin (JANUMET) 50-1000 MG per tablet TAKE ONE TABLET BY MOUTH TWICE DAILY   08/27/2016 at Unknown time  . topiramate (TOPAMAX) 200 MG tablet Take 200 mg by mouth 2 (two) times daily.    08/27/2016 at Unknown time  . Vilazodone HCl (VIIBRYD) 40 MG TABS Take 40 mg by mouth daily.   08/27/2016 at Unknown time  . nadolol (CORGARD) 20 MG tablet Take by mouth at bedtime.    07/23/2016 at 1800     Allergies  Allergen Reactions  . Cleocin [Clindamycin Hcl] Other (See Comments)    GI distress     Past Medical History:  Diagnosis Date  . Acid reflux   . Anemia   . Anxiety   . Bipolar disorder (HCC)   . Depression   . Diabetes mellitus    NIDD  . Essential tremor   . Hyperlipidemia   . Hypertension   . Hypothyroid   . IBS (irritable bowel syndrome)   . Migraine headache    vestibular migraine  . Obesity   . Osteopenia   . Restless leg syndrome   . Sarcoidosis (HCC)   . Sleep apnea   . Syncope \    Review of systems:      Physical Exam    Heart and lungs: Regular rate and rhythm without rub or gallop, lungs are bilaterally clear.    HEENT: Normocephalic atraumatic eyes are anicteric    Other:     Pertinant exam for procedure: Soft nontender nondistended bowel sounds positive normoactive.    Planned proceedures: EGD and indicated procedures. I have discussed the risks benefits and complications of procedures to include not limited to bleeding, infection, perforation  and the risk of sedation and the patient wishes to proceed.    Jennifer DeemMartin U Skulskie, MD Gastroenterology 08/28/2016  11:29 AM

## 2016-08-28 NOTE — Transfer of Care (Signed)
Immediate Anesthesia Transfer of Care Note  Patient: Jennifer Bright  Procedure(s) Performed: Procedure(s): ESOPHAGOGASTRODUODENOSCOPY (EGD) WITH PROPOFOL (N/A)  Patient Location: PACU  Anesthesia Type:General  Level of Consciousness: sedated  Airway & Oxygen Therapy: Patient Spontanous Breathing and Patient connected to nasal cannula oxygen  Post-op Assessment: Report given to RN and Post -op Vital signs reviewed and stable  Post vital signs: Reviewed and stable  Last Vitals:  Vitals:   08/28/16 1050 08/28/16 1208  BP: 109/72 (!) 88/62  Pulse: 83 85  Resp: 16 (!) 21  Temp: 37.3 C (!) 36 C    Last Pain:  Vitals:   08/28/16 1208  TempSrc: Tympanic         Complications: No apparent anesthesia complications

## 2016-08-31 ENCOUNTER — Encounter: Payer: Self-pay | Admitting: Urology

## 2016-08-31 ENCOUNTER — Ambulatory Visit (INDEPENDENT_AMBULATORY_CARE_PROVIDER_SITE_OTHER): Payer: BLUE CROSS/BLUE SHIELD | Admitting: Urology

## 2016-08-31 VITALS — BP 113/71 | HR 82 | Ht 64.0 in | Wt 165.4 lb

## 2016-08-31 DIAGNOSIS — N952 Postmenopausal atrophic vaginitis: Secondary | ICD-10-CM

## 2016-08-31 DIAGNOSIS — N3946 Mixed incontinence: Secondary | ICD-10-CM

## 2016-08-31 DIAGNOSIS — N289 Disorder of kidney and ureter, unspecified: Secondary | ICD-10-CM | POA: Diagnosis not present

## 2016-08-31 DIAGNOSIS — N941 Unspecified dyspareunia: Secondary | ICD-10-CM | POA: Diagnosis not present

## 2016-08-31 LAB — BLADDER SCAN AMB NON-IMAGING: Scan Result: 0

## 2016-08-31 NOTE — Progress Notes (Signed)
08/31/2016 9:13 AM   Jennifer Bright 09/21/1966 [id number]  Referring provider: Dorothey Basemanavid Bronstein, MD [phone] [place], [place] [id number]  Chief Complaint  Patient presents with  . Urinary Incontinence    HPI: Patient is a 50 year old Caucasian female with mixed incontinence, vaginal atrophy and dyspareunia who presents today for a 6 weeks follow up.  Background history Patient was a referral from [facility] for urinary incontinence.  She states for the last several months she's been having frequency, nocturia, intermittency, hesitancy and straining to urinate.  She is experiencing both stress and urge incontinence.  She states that the volume of incontinence is getting more severe and she is starting to wear incontinence pads.  She is not experiencing dysuria, gross hematuria or suprapubic pain. She also denies any fevers, chills, nausea or vomiting.  She is also experiencing dyspareunia.  She describes the pain as frictional in nature. They are using vaginal lubricants, but they have not relieved the pain.  She does not have a history of urinary tract infections, STI's or injury to the bladder.   She does have a history of nephrolithiasis and had a bladder sling placed in 2005.  She is peri menopausal.  She admits to constipation and/or diarrhea.  She is not having pain with bladder filling.  She is drinking good amount of water daily.   She is drinking 2 caffeinated beverages daily.  She is drinking not alcoholic beverages.   Her risk factors for incontinence are obesity, age, smoking, caffeine, diabetes, depression, vaginal atrophy, pelvic surgery and/or neurological disorders.  She is taking benzodiazepines, diuretics, antidepressants and antipsychotics.   Her PVR was 25 mL.  She is wanting non surgical options for her urinary symptoms.    Mixed incontinence At her initial visit, we discussed behavioral therapies, bladder training, bladder control strategies and offered PT.    Patient states that she has not been contacted by PT.  She is bothered a great deal with an uncomfortable urge to urinate.  She is somewhat bothered by a sudden urge to urinate and urine loss with a strong desire to urinate.  She is bothered a little bit by an accidental loss of a small amount of urine and night time urination.  Vaginal atrophy At her initial visit, we started patient on vaginal estrogen cream to be applied three nights weekly.   She did not use the vaginal estrogen cream.  She was fearful of the hormones.    Dyspareunia At her initial visit, she stated she was having pain during sexual intercourse.  It was frictional in nature.   Patient has been using Vaseline for lubrication.    Renal insuffiencey Patient states she was recently hospitalize for dehydration, hypercalcemia and hypomagnesemia.  Her most recent Cr was 1.5, GFR 37.  She states she was instructed to follow up with us.    PMH: Past Medical History:  Diagnosis Date  . Acid reflux   . Anemia   . Anxiety   . Bipolar disorder (HCC)   . Depression   . Diabetes mellitus    NIDD  . Essential tremor   . Hyperlipidemia   . Hypertension   . Hypothyroid   . IBS (irritable bowel syndrome)   . Migraine headache    vestibular migraine  . Obesity   . Osteopenia   . Restless leg syndrome   . Sarcoidosis (HCC)   . Sleep apnea   . Syncope \    Surgical History: Past  Surgical History:  Procedure Laterality Date  . ABDOMINAL HYSTERECTOMY    . APPENDECTOMY    . BLADDER SURGERY     bladder tact  . CESAREAN SECTION    . CHOLECYSTECTOMY    . ESOPHAGOGASTRODUODENOSCOPY (EGD) WITH PROPOFOL N/A 08/28/2016   Procedure: ESOPHAGOGASTRODUODENOSCOPY (EGD) WITH PROPOFOL;  Surgeon: [name], MD;  [facility] END[facility]COPY;  Service: Endoscopy;  Laterality: N/A;  . HERNIA REPAIR    . INGUINAL HERNIA REPAIR     rt and left  . lexiscan cardiolite    . TILT TABLE STUDY N/A 04/01/2012   Procedure: TILT TABLE STUDY;   Surgeon: [name], MD;  [facility];  Service: Cardiovascular;  Laterality: N/A;    Home Medications:    Medication List       Accurate as of 08/31/16  9:13 AM. Always use your most recent med list.          ALPRAZolam 1 MG tablet Commonly known as:  XANAX Take 1 mg by mouth at bedtime as needed. For sleep   atorvastatin 40 MG tablet Commonly known as:  LIPITOR Take 40 mg by mouth daily.   benztropine 1 MG tablet Commonly known as:  COGENTIN Take by mouth 2 (two) times daily.   buPROPion 300 MG 24 hr tablet Commonly known as:  WELLBUTRIN XL Take 300 mg by mouth daily.   conjugated estrogens vaginal cream Commonly known as:  PREMARIN Place 1 Applicatorful vaginally daily. Apply 0.5mg  (pea-sized amount)  just inside the vaginal introitus with a finger-tip every night for two weeks and then Monday, Wednesday and Friday nights.   docusate sodium 100 MG capsule Commonly known as:  COLACE Take by mouth.   esomeprazole 20 MG packet Commonly known as:  NEXIUM Take 20 mg by mouth daily before breakfast.   estradiol 0.1 MG/GM vaginal cream Commonly known as:  ESTRACE VAGINAL Apply 0.5mg  (pea-sized amount)  just inside the vaginal introitus with a finger-tip every night for two weeks and then Monday, Wednesday and Friday nights.   fish oil-omega-3 fatty acids 1000 MG capsule Take 1 g by mouth 2 (two) times daily.   furosemide 20 MG tablet Commonly known as:  LASIX Take 20 mg by mouth daily.   glimepiride 2 MG tablet Commonly known as:  AMARYL Take 1 mg by mouth at bedtime.   hyoscyamine 0.125 MG tablet Commonly known as:  LEVSIN, ANASPAZ Take 0.125 mg by mouth every 6 (six) hours as needed for cramping.   JANUMET 50-1000 MG tablet Generic drug:  sitaGLIPtin-metformin TAKE ONE TABLET BY MOUTH TWICE DAILY   lamoTRIgine 25 MG tablet Commonly known as:  LAMICTAL Take 25 mg by mouth 3 (three) times daily.   levothyroxine 150 MCG tablet Commonly  known as:  SYNTHROID, LEVOTHROID Take 150 mcg by mouth daily.   liothyronine 5 MCG tablet Commonly known as:  CYTOMEL TAKE ONE TABLET TWICE DAILY   MELATONIN PO Take 10 mg by mouth daily.   modafinil 200 MG tablet Commonly known as:  PROVIGIL Take 200 mg by mouth daily.   multivitamin with minerals Tabs tablet Take 1 tablet by mouth daily.   nadolol 20 MG tablet Commonly known as:  CORGARD Take by mouth at bedtime.   ONE TOUCH ULTRA TEST test strip Generic drug:  glucose blood TEST TWICE A DAY AS DIRECTED   ONETOUCH DELICA LANCETS FINE Misc USE AS DIRECTED   predniSONE 10 MG tablet Commonly known as:  DELTASONE Take 30 mg  by mouth daily with breakfast.   primidone 50 MG tablet Commonly known as:  MYSOLINE Take 100 mg by mouth at bedtime.   propranolol 60 MG tablet Commonly known as:  INDERAL Take 60 mg by mouth 2 (two) times daily.   SAPHRIS 10 MG Subl Generic drug:  Asenapine Maleate Place 10 mg under the tongue.   topiramate 200 MG tablet Commonly known as:  TOPAMAX Take 200 mg by mouth 2 (two) times daily.   VIIBRYD 40 MG Tabs Generic drug:  Vilazodone HCl Take 40 mg by mouth daily.       Allergies:  Allergies  Allergen Reactions  . Cleocin [Clindamycin Hcl] Other (See Comments)    GI distress    Family History: Family History  Problem Relation Age of Onset  . Cancer Father   . Hypertension Mother   . Hyperlipidemia Mother   . Alcohol abuse Brother   . Irritable bowel syndrome Sister   . Kidney cancer Neg Hx   . Kidney disease Neg Hx   . Prostate cancer Neg Hx     Social History:  reports that she has quit smoking. Her smoking use included Cigarettes. She has never used smokeless tobacco. She reports that she does not drink alcohol. Her drug history is not on file.  ROS: UROLOGY Frequent Urination?: No Hard to postpone urination?: No Burning/pain with urination?: No Get up at night to urinate?: Yes Leakage of urine?: No Urine  stream starts and stops?: No Trouble starting stream?: No Do you have to strain to urinate?: No Blood in urine?: No Urinary tract infection?: No Sexually transmitted disease?: No Injury to kidneys or bladder?: No Painful intercourse?: No Weak stream?: Yes Currently pregnant?: No Vaginal bleeding?: No Last menstrual period?: n  Gastrointestinal Nausea?: No Vomiting?: No Indigestion/heartburn?: No Diarrhea?: No Constipation?: No  Constitutional Fever: No Night sweats?: No Weight loss?: No Fatigue?: No  Skin Skin rash/lesions?: No Itching?: No  Eyes Blurred vision?: No Double vision?: No  Ears/Nose/Throat Sore throat?: No Sinus problems?: No  Hematologic/Lymphatic Swollen glands?: No Easy bruising?: No  Cardiovascular Leg swelling?: No Chest pain?: No  Respiratory Cough?: No Shortness of breath?: No  Endocrine Excessive thirst?: No  Musculoskeletal Back pain?: No Joint pain?: No  Neurological Headaches?: No Dizziness?: No  Psychologic Depression?: No Anxiety?: No  Physical Exam: BP 113/71 (BP Location: Left Arm, Patient Position: Sitting, Cuff Size: Normal)   Pulse 82   Ht 5\' 4"  (1.626 m)   Wt 165 lb 6.4 oz (75 kg)   BMI 28.39 kg/m   Constitutional: Well nourished. Alert and oriented, No acute distress. HEENT: [place] AT, moist mucus membranes. Trachea midline, no masses. Cardiovascular: No clubbing, cyanosis, or edema. Respiratory: Normal respiratory effort, no increased work of breathing. Skin: No rashes, bruises or suspicious lesions. Lymph: No cervical or inguinal adenopathy. Neurologic: Grossly intact, no focal deficits, moving all 4 extremities. Psychiatric: Normal mood and affect.  Laboratory Data: Lab Results  Component Value Date   WBC 7.4 07/25/2016   HGB 10.0 (L) 07/25/2016   HCT 28.6 (L) 07/25/2016   MCV 87.5 07/25/2016   PLT 206 07/25/2016    Lab Results  Component Value Date   CREATININE 1.52 (H) 07/26/2016      Lab Results  Component Value Date   HGBA1C 5.2 07/24/2016    Lab Results  Component Value Date   TSH 0.05 (L) 05/17/2014       Component Value Date/Time   CHOL 157 05/17/2014 0414  HDL 29 (L) 05/17/2014 0414   VLDL 54 (H) 05/17/2014 0414   LDLCALC 74 05/17/2014 0414    Lab Results  Component Value Date   AST 14 (L) 07/26/2016   Lab Results  Component Value Date   ALT 50 07/26/2016    Pertinent Imaging: Results for PADDY, WALTHALL (MRN [id number]) as of 08/31/2016 09:02  Ref. Range 08/31/2016 08:41  Scan Result Unknown 0   Assessment & Plan:    1. Mixed incontinence  - offered behavioral therapies, bladder training, bladder control strategies, pelvic floor muscle training- she would like a referral to PT  - BLADDER SCAN AMB NON-IMAGING  2. Vaginal atrophy  - may be contributing to her dyspareunia  - did not use the vaginal estrogen cream  - finding relief with Vaseline  3. Dyspareunia  - See above.  4. Renal insuffiencey  - recent GFR 37  - PVR is 0 mL, decreased renal function is not due to high residual  - obtain RUS  - refer to nephrology   Return for follow up after PT.  These notes generated with voice recognition software. I apologize for typographical errors.  Michiel Cowboy, PA-C  [facility] [address] [place], [place] [address] [phone]

## 2016-09-01 LAB — SURGICAL PATHOLOGY

## 2016-09-07 ENCOUNTER — Other Ambulatory Visit: Payer: Self-pay | Admitting: Urology

## 2016-09-07 ENCOUNTER — Telehealth: Payer: Self-pay | Admitting: Urology

## 2016-09-07 DIAGNOSIS — N289 Disorder of kidney and ureter, unspecified: Secondary | ICD-10-CM

## 2016-09-07 NOTE — Telephone Encounter (Signed)
Patient is calling about the referral to a kidney doctor, but I never got one. Can you out one in please.  Thanks   [employer]

## 2016-09-07 NOTE — Telephone Encounter (Signed)
Referral is in.

## 2016-09-14 ENCOUNTER — Other Ambulatory Visit: Payer: Self-pay | Admitting: Gastroenterology

## 2016-09-14 ENCOUNTER — Ambulatory Visit: Payer: BLUE CROSS/BLUE SHIELD

## 2016-09-14 DIAGNOSIS — R1031 Right lower quadrant pain: Secondary | ICD-10-CM

## 2016-09-23 ENCOUNTER — Ambulatory Visit
Admission: RE | Admit: 2016-09-23 | Discharge: 2016-09-23 | Disposition: A | Discharge status: Home / Self Care | Payer: BLUE CROSS/BLUE SHIELD | Source: Ambulatory Visit | Attending: Gastroenterology | Admitting: Gastroenterology

## 2016-09-23 DIAGNOSIS — K5790 Diverticulosis of intestine, part unspecified, without perforation or abscess without bleeding: Secondary | ICD-10-CM | POA: Insufficient documentation

## 2016-09-23 DIAGNOSIS — R938 Abnormal findings on diagnostic imaging of other specified body structures: Secondary | ICD-10-CM | POA: Insufficient documentation

## 2016-09-23 DIAGNOSIS — R1031 Right lower quadrant pain: Secondary | ICD-10-CM | POA: Diagnosis present

## 2016-09-23 MED ORDER — IOPAMIDOL (ISOVUE-300) INJECTION 61%
100.0000 mL | Freq: Once | INTRAVENOUS | Status: AC | PRN
  Administered 2016-09-23: 100 mL via INTRAVENOUS

## 2017-01-19 ENCOUNTER — Other Ambulatory Visit: Payer: Self-pay | Admitting: Specialist

## 2017-01-19 DIAGNOSIS — D869 Sarcoidosis, unspecified: Secondary | ICD-10-CM

## 2017-02-03 ENCOUNTER — Other Ambulatory Visit: Payer: Self-pay | Admitting: Internal Medicine

## 2017-02-03 DIAGNOSIS — R339 Retention of urine, unspecified: Secondary | ICD-10-CM

## 2017-02-08 ENCOUNTER — Ambulatory Visit
Admission: RE | Admit: 2017-02-08 | Discharge: 2017-02-08 | Disposition: A | Discharge status: Home / Self Care | Payer: Medicare Other | Source: Ambulatory Visit | Attending: Internal Medicine | Admitting: Internal Medicine

## 2017-02-08 DIAGNOSIS — R339 Retention of urine, unspecified: Secondary | ICD-10-CM | POA: Diagnosis not present

## 2017-02-08 DIAGNOSIS — R109 Unspecified abdominal pain: Secondary | ICD-10-CM | POA: Insufficient documentation

## 2017-02-08 DIAGNOSIS — N2889 Other specified disorders of kidney and ureter: Secondary | ICD-10-CM | POA: Insufficient documentation

## 2017-02-09 ENCOUNTER — Emergency Department
Admission: EM | Admit: 2017-02-09 | Discharge: 2017-02-09 | Disposition: A | Discharge status: Home / Self Care | Payer: Medicare Other | Attending: Emergency Medicine | Admitting: Emergency Medicine

## 2017-02-09 ENCOUNTER — Emergency Department: Payer: Medicare Other

## 2017-02-09 ENCOUNTER — Encounter: Payer: Self-pay | Admitting: Emergency Medicine

## 2017-02-09 DIAGNOSIS — E039 Hypothyroidism, unspecified: Secondary | ICD-10-CM | POA: Insufficient documentation

## 2017-02-09 DIAGNOSIS — Z87891 Personal history of nicotine dependence: Secondary | ICD-10-CM | POA: Diagnosis not present

## 2017-02-09 DIAGNOSIS — Y999 Unspecified external cause status: Secondary | ICD-10-CM | POA: Diagnosis not present

## 2017-02-09 DIAGNOSIS — I10 Essential (primary) hypertension: Secondary | ICD-10-CM | POA: Insufficient documentation

## 2017-02-09 DIAGNOSIS — W1809XA Striking against other object with subsequent fall, initial encounter: Secondary | ICD-10-CM | POA: Insufficient documentation

## 2017-02-09 DIAGNOSIS — Z7984 Long term (current) use of oral hypoglycemic drugs: Secondary | ICD-10-CM | POA: Insufficient documentation

## 2017-02-09 DIAGNOSIS — E119 Type 2 diabetes mellitus without complications: Secondary | ICD-10-CM | POA: Insufficient documentation

## 2017-02-09 DIAGNOSIS — S060X0A Concussion without loss of consciousness, initial encounter: Secondary | ICD-10-CM | POA: Insufficient documentation

## 2017-02-09 DIAGNOSIS — W19XXXA Unspecified fall, initial encounter: Secondary | ICD-10-CM

## 2017-02-09 DIAGNOSIS — S0990XA Unspecified injury of head, initial encounter: Secondary | ICD-10-CM | POA: Diagnosis present

## 2017-02-09 DIAGNOSIS — Y939 Activity, unspecified: Secondary | ICD-10-CM | POA: Insufficient documentation

## 2017-02-09 DIAGNOSIS — Y929 Unspecified place or not applicable: Secondary | ICD-10-CM | POA: Insufficient documentation

## 2017-02-09 DIAGNOSIS — Z79899 Other long term (current) drug therapy: Secondary | ICD-10-CM | POA: Insufficient documentation

## 2017-02-09 MED ORDER — MECLIZINE HCL 25 MG PO TABS
25.0000 mg | ORAL_TABLET | Freq: Once | ORAL | Status: DC
  Filled 2017-02-09: qty 1

## 2017-02-09 MED ORDER — MECLIZINE HCL 25 MG PO TABS
25.0000 mg | ORAL_TABLET | Freq: Once | ORAL | Status: AC
  Administered 2017-02-09: 25 mg via ORAL
  Filled 2017-02-09: qty 1

## 2017-02-09 MED ORDER — MECLIZINE HCL 32 MG PO TABS: 32.0000 mg | ORAL_TABLET | Freq: Three times a day (TID) | ORAL | 0 refills | Status: DC | PRN

## 2017-02-09 NOTE — ED Provider Notes (Signed)
[facility] Emergency Department Provider Note  ____________________________________________  Time seen: Approximately 5:56 PM  I have reviewed the triage vital signs and the nursing notes.   HISTORY  Chief Complaint Fall    HPI Jennifer Bright is a 51 y.o. female that presents to the emergency room with dizziness after falling today. Patient states that she has a history of migraines that cause her to fall. Patient states that she has several falls a week for 10 years. She is followed by neurology in [place]. She states that usually she does not hit her head but today she fell she hit the back of her head very hard. She is concerned for a concussion. She does not want any further workup done. She denies headache, vision changes, neck pain, shortness of breath, chest pain, nausea, vomiting, abdominal pain.   Past Medical History:  Diagnosis Date  . Acid reflux   . Anemia   . Anxiety   . Bipolar disorder (HCC)   . Depression   . Diabetes mellitus    NIDD  . Essential tremor   . Hyperlipidemia   . Hypothyroid   . IBS (irritable bowel syndrome)   . Migraine headache    vestibular migraine  . Obesity   . Osteopenia   . Restless leg syndrome   . Sarcoidosis   . Sleep apnea   . Syncope \    Patient Active Problem List   Diagnosis Date Noted  . Polypharmacy 08/14/2016  . 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 Hypercalcemia 07/24/2016  . Sarcoidosis 07/24/2016  . Hyperlipidemia, unspecified 11/15/2015  . Type 2 diabetes mellitus without complication, without long-term current use of insulin (HCC) 11/15/2015  . Anxiety disorder 07/29/2015  . Anxiety 05/12/2015  . Bipolar depression (HCC) 05/12/2015  . Essential (primary) hypertension 05/12/2015  . Benign essential tremor 05/12/2015  . Gastro-esophageal reflux disease without esophagitis 05/12/2015  . Cephalalgia 05/12/2015  . Hypersomnia with sleep apnea 05/12/2015  . Restless leg  05/12/2015  . Diabetes (HCC) 05/12/2015  . Hypothyroidism 05/12/2015  . Migraine variant 05/12/2015  . Bipolar disorder (HCC) 05/12/2015  . Type 2 diabetes mellitus without complications (HCC) 05/12/2015  . Chronic migraine without aura 09/03/2012  . 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 Syncope and collapse 03/17/2012  . Depression 03/17/2012  . Migraine 03/17/2012    Past Surgical History:  Procedure Laterality Date  . ABDOMINAL HYSTERECTOMY    . APPENDECTOMY    . BLADDER SURGERY     bladder tact  . CESAREAN SECTION    . CHOLECYSTECTOMY    . ESOPHAGOGASTRODUODENOSCOPY (EGD) WITH PROPOFOL N/A 08/28/2016   Procedure: ESOPHAGOGASTRODUODENOSCOPY (EGD) WITH PROPOFOL;  Surgeon: Christena Deem, MD;  Location[facility] ENDOSCOPY;  Service: Endoscopy;  Laterality: N/A;  . HERNIA REPAIR    . INGUINAL HERNIA REPAIR     rt and left  . lexiscan cardiolite    . TILT TABLE STUDY N/A 04/01/2012   Procedure: TILT TABLE STUDY;  Surgeon: Duke Salvia, MD;  Location: [facility] CATH LAB;  Service: Cardiovascular;  Laterality: N/A;    Prior to Admission medications   Medication Sig Start Date End Date Taking? Authorizing Provider  ALPRAZolam Prudy Feeler) 1 MG tablet Take 1 mg by mouth at bedtime as needed. For sleep    Historical Provider, MD  Asenapine Maleate (SAPHRIS) 10 MG SUBL Place 10 mg under the tongue.    Historical Provider, MD  atorvastatin (LIPITOR) 40 MG tablet Take 40 mg by mouth daily.  Historical Provider, MD  benztropine (COGENTIN) 1 MG tablet Take by mouth 2 (two) times daily.     Historical Provider, MD  buPROPion (WELLBUTRIN XL) 300 MG 24 hr tablet Take 300 mg by mouth daily.    Historical Provider, MD  conjugated estrogens (PREMARIN) vaginal cream Place 1 Applicatorful vaginally daily. Apply 0.5mg  (pea-sized amount)  just inside the vaginal introitus with a finger-tip every night for two weeks and then Monday, Wednesday and Friday nights. Patient not taking: Reported on  08/31/2016 07/17/16   Carollee Herter A McGowan, PA-C  docusate sodium (COLACE) 100 MG capsule Take by mouth.    Historical Provider, MD  esomeprazole (NEXIUM) 20 MG packet Take 20 mg by mouth daily before breakfast.    Historical Provider, MD  estradiol (ESTRACE VAGINAL) 0.1 MG/GM vaginal cream Apply 0.5mg  (pea-sized amount)  just inside the vaginal introitus with a finger-tip every night for two weeks and then Monday, Wednesday and Friday nights. Patient not taking: Reported on 08/31/2016 07/17/16   Carollee Herter A McGowan, PA-C  fish oil-omega-3 fatty acids 1000 MG capsule Take 1 g by mouth 2 (two) times daily.    Historical Provider, MD  furosemide (LASIX) 20 MG tablet Take 20 mg by mouth daily.    Historical Provider, MD  glimepiride (AMARYL) 2 MG tablet Take 1 mg by mouth at bedtime.  06/12/16   Historical Provider, MD  glucose blood (ONE TOUCH ULTRA TEST) test strip TEST TWICE A DAY AS DIRECTED 07/15/16   Historical Provider, MD  hyoscyamine (LEVSIN, ANASPAZ) 0.125 MG tablet Take 0.125 mg by mouth every 6 (six) hours as needed for cramping.    Historical Provider, MD  lamoTRIgine (LAMICTAL) 25 MG tablet Take 25 mg by mouth 3 (three) times daily.     Historical Provider, MD  levothyroxine (SYNTHROID, LEVOTHROID) 150 MCG tablet Take 150 mcg by mouth daily.     Historical Provider, MD  liothyronine (CYTOMEL) 5 MCG tablet TAKE ONE TABLET TWICE DAILY 05/11/16   Historical Provider, MD  meclizine (ANTIVERT) 32 MG tablet Take 1 tablet (32 mg total) by mouth 3 (three) times daily as needed. 02/09/17   Enid Derry, PA-C  modafinil (PROVIGIL) 200 MG tablet Take 200 mg by mouth daily.    Historical Provider, MD  Multiple Vitamin (MULITIVITAMIN WITH MINERALS) TABS Take 1 tablet by mouth daily.    Historical Provider, MD  nadolol (CORGARD) 20 MG tablet Take by mouth at bedtime.  07/29/15 07/28/16  Historical Provider, MD  Nutritional Supplements (MELATONIN PO) Take 10 mg by mouth daily.    Historical Provider, MD   [facility] DELICA LANCETS FINE MISC USE AS DIRECTED 07/15/16   Historical Provider, MD  predniSONE (DELTASONE) 10 MG tablet Take 30 mg by mouth daily with breakfast.    Historical Provider, MD  primidone (MYSOLINE) 50 MG tablet Take 100 mg by mouth at bedtime.     Historical Provider, MD  propranolol (INDERAL) 60 MG tablet Take 60 mg by mouth 2 (two) times daily.    Historical Provider, MD  sitaGLIPtin-metformin (JANUMET) 50-1000 MG per tablet TAKE ONE TABLET BY MOUTH TWICE DAILY 04/23/15   Historical Provider, MD  topiramate (TOPAMAX) 200 MG tablet Take 200 mg by mouth 2 (two) times daily.     Historical Provider, MD  Vilazodone HCl (VIIBRYD) 40 MG TABS Take 40 mg by mouth daily.    Historical Provider, MD    Allergies Cleocin [clindamycin hcl]  Family History  Problem Relation Age of Onset  . Cancer Father   .  Hypertension Mother   . Hyperlipidemia Mother   . Alcohol abuse Brother   . Irritable bowel syndrome Sister   . Kidney cancer Neg Hx   . Kidney disease Neg Hx   . Prostate cancer Neg Hx     Social History Social History  Substance Use Topics  . Smoking status: Former Smoker    Types: Cigarettes  . Smokeless tobacco: Never Used  . Alcohol use No     Review of Systems  Constitutional: No fever/chills Cardiovascular: No chest pain. Respiratory:  No SOB. Gastrointestinal: No abdominal pain.  No nausea, no vomiting.  Musculoskeletal: Negative for musculoskeletal pain. Skin: Negative for rash, abrasions, lacerations. Neurological: Negative for numbness or tingling   ____________________________________________   PHYSICAL EXAM:  VITAL SIGNS: ED Triage Vitals  Enc Vitals Group     BP 02/09/17 1515 109/64     Pulse Rate 02/09/17 1515 (!) 105     Resp 02/09/17 1515 18     Temp 02/09/17 1515 98.8 F (37.1 C)     Temp Source 02/09/17 1515 Oral     SpO2 02/09/17 1515 95 %     Weight 02/09/17 1428 165 lb (74.8 kg)     Height 02/09/17 1515  (1.626 m)     Head  Circumference --      Peak Flow --      Pain Score 02/09/17 1428 3     Pain Loc --      Pain Edu? --      Excl. in GC? --      Constitutional: Alert and oriented. Well appearing and in no acute distress. Eyes: Conjunctivae are normal. PERRL. EOMI. Head:  ENT: Tenderness to palpation over back of head.      Ears:      Nose: No congestion/rhinnorhea.      Mouth/Throat: Mucous membranes are moist.  Neck: No stridor.  No cervical spine tenderness to palpation. Cardiovascular: Normal rate, regular rhythm.  Good peripheral circulation. Respiratory: Normal respiratory effort without tachypnea or retractions. Lungs CTAB. Good air entry to the bases with no decreased or absent breath sounds. Gastrointestinal: Bowel sounds 4 quadrants. Soft and nontender to palpation. No guarding or rigidity. No palpable masses. No distention. Musculoskeletal: Full range of motion to all extremities. No gross deformities appreciated. Neurologic:  Normal speech and language. No gross focal neurologic deficits are appreciated.  Skin:  Skin is warm, dry and intact.    ____________________________________________   LABS (all labs ordered are listed, but only abnormal results are displayed)  Labs Reviewed - No data to display ____________________________________________  EKG   ____________________________________________  RADIOLOGY Lexine Baton, personally viewed and evaluated these images (plain radiographs) as part of my medical decision making, as well as reviewing the written report by the radiologist.  Ct Head Wo Contrast  Result Date: 02/09/2017 CLINICAL DATA:  Migraines and syncope. Patient fell and hit back of head today. EXAM: CT HEAD WITHOUT CONTRAST TECHNIQUE: Contiguous axial images were obtained from the base of the skull through the vertex without intravenous contrast. COMPARISON:  11/24/2011.  04/21/2011 FINDINGS: Brain: No acute stroke, acute hemorrhage, mass lesion, hydrocephalus, or  extra-axial fluid. Increasing prominence of the extra-axial CSF spaces, most notably anterior to the temporal tips, and over the anterior frontal lobes, consistent with progressive cerebral atrophy, increased from 2012 and 2013. Vascular: No hyperdense vessel or unexpected calcification. Skull: Normal. Negative for fracture or focal lesion. Sinuses/Orbits: No acute finding. Other: None. IMPRESSION: Progressive atrophy since 2013.  No acute intracranial findings. No skull fracture or intracranial hemorrhage. Electronically Signed   By: Elsie Stain M.D.   On: 02/09/2017 16:51    ____________________________________________    PROCEDURES  Procedure(s) performed:    Procedures    Medications  meclizine (ANTIVERT) tablet 25 mg (not administered)  meclizine (ANTIVERT) tablet 25 mg (25 mg Oral Given 02/09/17 1737)     ____________________________________________   INITIAL IMPRESSION / ASSESSMENT AND PLAN / ED COURSE  Pertinent labs & imaging results that were available during my care of the patient were reviewed by me and considered in my medical decision making (see chart for details).  Review of the [place] CSRS was performed in accordance of the [facility] prior to dispensing any controlled drugs.     Patient's diagnosis is consistent with concussion. Vital signs and exam are reassuring. Head CT negative for acute processes. Patient does not want any further workup done. She refused EKG. Dizziness improved with meclizine. Patient will be discharged home with prescriptions for meclizine. Education was provided. Patient is to follow up with neurology as directed. Patient is given ED precautions to return to the ED for any worsening or new symptoms.     ____________________________________________  FINAL CLINICAL IMPRESSION(S) / ED DIAGNOSES  Final diagnoses:  Fall, initial encounter  Concussion without loss of consciousness, initial encounter      NEW MEDICATIONS STARTED DURING THIS  VISIT:  New Prescriptions   MECLIZINE (ANTIVERT) 32 MG TABLET    Take 1 tablet (32 mg total) by mouth 3 (three) times daily as needed.        This chart was dictated using voice recognition software/Dragon. Despite best efforts to proofread, errors can occur which can change the meaning. Any change was purely unintentional.    Enid Derry, PA-C 02/09/17 1841    Myrna Blazer, MD 02/09/17 2258

## 2017-02-09 NOTE — ED Triage Notes (Signed)
Pt states has hx of migraines and syncope. Pt states she fall and hit the back of head today. NAD noted at time of check in.

## 2017-02-09 NOTE — ED Notes (Signed)
Pt has refused EKG

## 2017-02-09 NOTE — ED Notes (Signed)
Back from ct   Family at bedside    

## 2017-02-09 NOTE — ED Notes (Signed)
Pt refused EKG. Stated "I already know why I fell. I want to make sure I don't have a concussion."

## 2017-02-09 NOTE — ED Notes (Signed)
See triage note  States she fell backwards this afternoon  Hit back of head   No LOC  Hematoma to back of head   Having slight pain to head

## 2017-02-09 NOTE — ED Notes (Signed)
FIRST NURSE NOTE: PT STATES SHE FELL AND HIT THE BACK OF HEAD PRIOR TO ARRIVAL. PT REPORTS HAVING MIGRAINES AND PASSES OUT SOMETIMES. NAD, ALERT AND ORIENTED WHILE CHECKING IN.

## 2017-03-19 ENCOUNTER — Encounter: Payer: Self-pay | Admitting: Obstetrics & Gynecology

## 2017-03-19 ENCOUNTER — Ambulatory Visit (INDEPENDENT_AMBULATORY_CARE_PROVIDER_SITE_OTHER): Payer: Medicare Other | Admitting: Obstetrics & Gynecology

## 2017-03-19 VITALS — BP 100/60 | HR 108 | Ht 64.0 in | Wt 163.0 lb

## 2017-03-19 DIAGNOSIS — N952 Postmenopausal atrophic vaginitis: Secondary | ICD-10-CM | POA: Diagnosis not present

## 2017-03-19 DIAGNOSIS — R102 Pelvic and perineal pain: Secondary | ICD-10-CM | POA: Diagnosis not present

## 2017-03-19 MED ORDER — OSPEMIFENE 60 MG PO TABS: 1.0000 | ORAL_TABLET | Freq: Every day | ORAL | 11 refills | Status: DC

## 2017-03-19 NOTE — Patient Instructions (Signed)
Ospemifene oral tablets What is this medicine? OSPEMIFENE (os PEM i feen) is used to treat painful sexual intercourse in females after menopause, a symptom of menopause that occurs due to changes in and around the vagina. This medicine may be used for other purposes; ask your health care provider or pharmacist if you have questions. COMMON BRAND NAME(S): Osphena What should I tell my health care provider before I take this medicine? They need to know if you have any of these conditions: -cancer, such as breast, uterine, or other cancer -heart disease -history of blood clots -history of stroke -history of vaginal bleeding -liver disease -premenopausal -smoke tobacco -an unusual or allergic reaction to ospemifene, other medicines, foods, dyes, or preservatives -pregnant or trying to get pregnant -breast-feeding How should I use this medicine? Take this medicine by mouth with a glass of water. Take this medicine with food. Follow the directions on the prescription label. Do not take your medicine more often than directed. Talk to your pediatrician regarding the use of this medicine in children. Special care may be needed. Overdosage: If you think you have taken too much of this medicine contact a poison control center or emergency room at once. NOTE: This medicine is only for you. Do not share this medicine with others. What if I miss a dose? If you miss a dose, take it as soon as you can. If it is almost time for your next dose, take only that dose. Do not take double or extra doses. What may interact with this medicine? -doxycycline -estrogens -fluconazole -furosemide -glyburide -ketoconazole -phenytoin -rifampin -warfarin This list may not describe all possible interactions. Give your health care provider a list of all the medicines, herbs, non-prescription drugs, or dietary supplements you use. Also tell them if you smoke, drink alcohol, or use illegal drugs. Some items may  interact with your medicine. What should I watch for while using this medicine? Visit your health care professional for regular checks on your progress. You will need a regular breast and pelvic exam and Pap smear while on this medicine. You should also discuss the need for regular mammograms with your health care professional, and follow his or her guidelines for these tests. Also, periodically discuss the need to continue taking this medicine. Taking this medicine for long periods of time may increase your risk for serious side effects. This medicine can increase the risk of developing a condition (endometrial hyperplasia) that may lead to cancer of the lining of the uterus. Taking progestins, another hormone drug, with this medicine lowers the risk of developing this condition. Therefore, if your uterus has not been removed (by a hysterectomy), your doctor may prescribe a progestin for you to take together with your estrogen. You should know, however, that taking estrogens with progestins may have additional health risks. You should discuss the use of estrogens and progestins with your health care professional to determine the benefits and risks for you. This medicine can rarely cause blood clots. You should avoid long periods of bed rest while taking this medicine. If you are going to have surgery, tell your doctor or health care professional that you are taking this medicine. This medicine should be stopped at least 4-6 weeks before surgery. After surgery, it should be restarted only after you are walking again. It should not be restarted while you still need long periods of bed rest. You should not smoke while taking this medicine. Smoking may also increase your risk of blood clots. Smoking can also   decrease the effects of this medicine. This medicine does not prevent hot flashes. It may cause hot flashes in some patients. If you have any reason to think you are pregnant; stop taking this medicine at  once and contact your doctor or health care professional. What side effects may I notice from receiving this medicine? Side effects that you should report to your doctor or health care professional as soon as possible: -breathing problems -changes in vision -confusion, trouble speaking or understanding -new breast lumps -pain, swelling, warmth in the leg -pelvic pain or pressure -severe headaches -sudden chest pain -sudden numbness or weakness of the face, arm or leg -trouble walking, dizziness, loss of balance or coordination -unusual vaginal bleeding patterns -vaginal discharge that is bloody or brown Side effects that usually do not require medical attention (report to your doctor or health care professional if they continue or are bothersome): -hot flushes or flashes -increased sweating -muscle cramps -vaginal discharge (white or clear) This list may not describe all possible side effects. Call your doctor for medical advice about side effects. You may report side effects to FDA at 1-800-FDA-1088. Where should I keep my medicine? Keep out of the reach of children. Store at room temperature between 20 and 25 degrees C (68 and 77 degrees F). Protect from light. Keep container tightly closed. Throw away any unused medicine after the expiration date. NOTE: This sheet is a summary. It may not cover all possible information. If you have questions about this medicine, talk to your doctor, pharmacist, or health care provider.  2018 Elsevier/Gold Standard (2015-11-07 10:08:00)  

## 2017-03-19 NOTE — Progress Notes (Signed)
HPI:      Ms. Juan QuamMichele F Skilton is a 51 y.o. [id number] who is postmenopausal, presents today for a problem visit.  She complains of vaginal dryness, dysparunea.   Symptoms have been present for a few years. Symptoms are mod to severe and has led her to come in today to seek options for intervention.  Pain on entry and vaginally w sex.  KY helps mildly. Also recently had lower pains shooting vaginally.  No bleeding or assoc sx's.  Previous Treatment: none.  Menopause from BSO and prior hyst years ago and stopped hot flashes long ago.   She is single partner, contraception - none. Denies PostMenopausal Bleeding  PMHx: She  has a past medical history of Acid reflux; Anemia; Anxiety; Bipolar disorder (HCC); Depression; Diabetes mellitus; Essential tremor; Hyperlipidemia; Hypothyroid; IBS (irritable bowel syndrome); Migraine headache; Obesity; Osteopenia; Restless leg syndrome; Sarcoidosis; Sleep apnea; and Syncope (\). Also,  has a past surgical history that includes Appendectomy; Cholecystectomy; Abdominal hysterectomy; Inguinal hernia repair; lexiscan cardiolite; tilt table study (N/A, 04/01/2012); Cesarean section; Hernia repair; Bladder surgery; and Esophagogastroduodenoscopy (egd) with propofol (N/A, 08/28/2016)., family history includes Alcohol abuse in her brother; Cancer in her father; Hyperlipidemia in her mother; Hypertension in her mother; Irritable bowel syndrome in her sister.,  reports that she has quit smoking. Her smoking use included Cigarettes. She has never used smokeless tobacco. She reports that she does not drink alcohol or use drugs.  She has a current medication list which includes the following prescription(s): alprazolam, asenapine maleate, atorvastatin, benztropine, bupropion, conjugated estrogens, docusate sodium, esomeprazole, estradiol, fish oil-omega-3 fatty acids, furosemide, glimepiride, glucose blood, hyoscyamine, lamotrigine, levothyroxine, liothyronine, meclizine,  modafinil, multivitamin with minerals, nadolol, melatonin, onetouch delica lancets fine, ospemifene, prednisone, primidone, propranolol, sitagliptin-metformin, topiramate, and vilazodone hcl. Also, is allergic to cleocin [clindamycin hcl].  Review of Systems  Constitutional: Negative for chills, fever and malaise/fatigue.  HENT: Negative for congestion, sinus pain and sore throat.   Eyes: Negative for blurred vision and pain.  Respiratory: Negative for cough and wheezing.   Cardiovascular: Negative for chest pain and leg swelling.  Gastrointestinal: Negative for abdominal pain, constipation, diarrhea, heartburn, nausea and vomiting.  Genitourinary: Negative for dysuria, frequency, hematuria and urgency.  Musculoskeletal: Negative for back pain, joint pain, myalgias and neck pain.  Skin: Negative for itching and rash.  Neurological: Negative for dizziness, tremors and weakness.  Endo/Heme/Allergies: Does not bruise/bleed easily.  Psychiatric/Behavioral: Negative for depression. The patient is not nervous/anxious and does not have insomnia.     Objective: BP 100/60   Pulse (!) 108   Ht 5\' 4"  (1.626 m)   Wt 163 lb (73.9 kg)   BMI 27.98 kg/m  Physical Exam  Constitutional: She is oriented to person, place, and time. She appears well-developed and well-nourished. No distress.  Genitourinary: Vagina normal and uterus normal. Pelvic exam was performed with patient supine. There is no rash, tenderness or lesion on the right labia. There is no rash, tenderness or lesion on the left labia. No erythema or bleeding in the vagina. Right adnexum does not display mass and does not display tenderness. Left adnexum does not display mass and does not display tenderness. Cervix does not exhibit motion tenderness, discharge, polyp or nabothian cyst.   Uterus is mobile and midaxial. Uterus is not enlarged or exhibiting a mass.  Abdominal: Soft. She exhibits no distension. There is no tenderness.    Musculoskeletal: Normal range of motion.  Neurological: She is alert and oriented to person, place, and  time. No cranial nerve deficit.  Skin: Skin is warm and dry.  Psychiatric: She has a normal mood and affect.    ASSESSMENT/PLAN:  Menopause, and below dx. 1. Vaginal pain No obvious cause on exam.  Prior total hyst.  No GYN etiology other than adhesions from surgery. GI or other etiology.  Consider CT if persists.  2. Vaginal atrophy All options discussed. Does not want ERT/vag estrogen, concerned over side effects.  Replens, Osphena, and Intrarosa discussed.  Prefers to try pill first (osphena).  Pros and cons discussed. F/u 8 weeks.   Annamarie Major, MD, [facility], [facility] 03/19/2017  3:57 PM

## 2017-03-22 ENCOUNTER — Ambulatory Visit: Payer: Medicare Other

## 2017-03-26 ENCOUNTER — Ambulatory Visit
Admission: RE | Admit: 2017-03-26 | Discharge: 2017-03-26 | Disposition: A | Discharge status: Home / Self Care | Payer: Medicare Other | Source: Ambulatory Visit | Attending: Specialist | Admitting: Specialist

## 2017-03-26 DIAGNOSIS — I2510 Atherosclerotic heart disease of native coronary artery without angina pectoris: Secondary | ICD-10-CM | POA: Diagnosis not present

## 2017-03-26 DIAGNOSIS — D869 Sarcoidosis, unspecified: Secondary | ICD-10-CM

## 2017-03-26 DIAGNOSIS — J8410 Pulmonary fibrosis, unspecified: Secondary | ICD-10-CM | POA: Diagnosis not present

## 2017-03-26 DIAGNOSIS — D860 Sarcoidosis of lung: Secondary | ICD-10-CM | POA: Diagnosis not present

## 2017-04-05 ENCOUNTER — Encounter
Admission: RE | Disposition: A | Discharge status: Home / Self Care | Payer: Self-pay | Source: Ambulatory Visit | Attending: Gastroenterology

## 2017-04-05 ENCOUNTER — Ambulatory Visit: Payer: Medicare Other | Admitting: Anesthesiology

## 2017-04-05 ENCOUNTER — Ambulatory Visit
Admission: RE | Admit: 2017-04-05 | Discharge: 2017-04-05 | Disposition: A | Discharge status: Home / Self Care | Payer: Medicare Other | Source: Ambulatory Visit | Attending: Gastroenterology | Admitting: Gastroenterology

## 2017-04-05 DIAGNOSIS — Q438 Other specified congenital malformations of intestine: Secondary | ICD-10-CM | POA: Diagnosis not present

## 2017-04-05 DIAGNOSIS — E785 Hyperlipidemia, unspecified: Secondary | ICD-10-CM | POA: Diagnosis not present

## 2017-04-05 DIAGNOSIS — Z87891 Personal history of nicotine dependence: Secondary | ICD-10-CM | POA: Diagnosis not present

## 2017-04-05 DIAGNOSIS — I10 Essential (primary) hypertension: Secondary | ICD-10-CM | POA: Insufficient documentation

## 2017-04-05 DIAGNOSIS — G4730 Sleep apnea, unspecified: Secondary | ICD-10-CM | POA: Diagnosis not present

## 2017-04-05 DIAGNOSIS — K5730 Diverticulosis of large intestine without perforation or abscess without bleeding: Secondary | ICD-10-CM | POA: Diagnosis not present

## 2017-04-05 DIAGNOSIS — F329 Major depressive disorder, single episode, unspecified: Secondary | ICD-10-CM | POA: Insufficient documentation

## 2017-04-05 DIAGNOSIS — F419 Anxiety disorder, unspecified: Secondary | ICD-10-CM | POA: Insufficient documentation

## 2017-04-05 DIAGNOSIS — K5900 Constipation, unspecified: Secondary | ICD-10-CM | POA: Diagnosis not present

## 2017-04-05 DIAGNOSIS — Z79899 Other long term (current) drug therapy: Secondary | ICD-10-CM | POA: Diagnosis not present

## 2017-04-05 DIAGNOSIS — K219 Gastro-esophageal reflux disease without esophagitis: Secondary | ICD-10-CM | POA: Diagnosis not present

## 2017-04-05 DIAGNOSIS — Z800 Family history of malignant neoplasm of digestive organs: Secondary | ICD-10-CM | POA: Diagnosis not present

## 2017-04-05 DIAGNOSIS — Z7952 Long term (current) use of systemic steroids: Secondary | ICD-10-CM | POA: Diagnosis not present

## 2017-04-05 DIAGNOSIS — G43909 Migraine, unspecified, not intractable, without status migrainosus: Secondary | ICD-10-CM | POA: Insufficient documentation

## 2017-04-05 DIAGNOSIS — Z7984 Long term (current) use of oral hypoglycemic drugs: Secondary | ICD-10-CM | POA: Diagnosis not present

## 2017-04-05 DIAGNOSIS — E039 Hypothyroidism, unspecified: Secondary | ICD-10-CM | POA: Insufficient documentation

## 2017-04-05 DIAGNOSIS — E119 Type 2 diabetes mellitus without complications: Secondary | ICD-10-CM | POA: Diagnosis not present

## 2017-04-05 DIAGNOSIS — K641 Second degree hemorrhoids: Secondary | ICD-10-CM | POA: Diagnosis not present

## 2017-04-05 HISTORY — PX: COLONOSCOPY WITH PROPOFOL: SHX5780

## 2017-04-05 LAB — GLUCOSE, CAPILLARY: GLUCOSE-CAPILLARY: 136 mg/dL — AB (ref 65–99)

## 2017-04-05 SURGERY — COLONOSCOPY WITH PROPOFOL
Anesthesia: General

## 2017-04-05 MED ORDER — SODIUM CHLORIDE 0.9 % IV SOLN
INTRAVENOUS | Status: DC
  Administered 2017-04-05: 13:00:00 via INTRAVENOUS
  Administered 2017-04-05: 1000 mL via INTRAVENOUS

## 2017-04-05 MED ORDER — LIDOCAINE HCL (PF) 1 % IJ SOLN
INTRAMUSCULAR | Status: AC
  Administered 2017-04-05: 0.3 mL via INTRADERMAL
  Filled 2017-04-05: qty 2

## 2017-04-05 MED ORDER — LIDOCAINE HCL (PF) 1 % IJ SOLN
2.0000 mL | Freq: Once | INTRAMUSCULAR | Status: AC
  Administered 2017-04-05: 0.3 mL via INTRADERMAL

## 2017-04-05 MED ORDER — PROPOFOL 10 MG/ML IV BOLUS
INTRAVENOUS | Status: DC | PRN
  Administered 2017-04-05: 30 mg via INTRAVENOUS
  Administered 2017-04-05: 50 mg via INTRAVENOUS

## 2017-04-05 MED ORDER — PROPOFOL 500 MG/50ML IV EMUL
INTRAVENOUS | Status: DC | PRN
  Administered 2017-04-05: 150 ug/kg/min via INTRAVENOUS

## 2017-04-05 MED ORDER — SODIUM CHLORIDE 0.9 % IV SOLN: INTRAVENOUS | Status: DC

## 2017-04-05 NOTE — Transfer of Care (Signed)
Immediate Anesthesia Transfer of Care Note  Patient: Jennifer Bright  Procedure(s) Performed: Procedure(s): COLONOSCOPY WITH PROPOFOL (N/A)  Patient Location: PACU  Anesthesia Type:General  Level of Consciousness: awake, alert  and oriented  Airway & Oxygen Therapy: Patient Spontanous Breathing and Patient connected to nasal cannula oxygen  Post-op Assessment: Report given to RN and Post -op Vital signs reviewed and stable  Post vital signs: Reviewed and stable  Last Vitals:  Vitals:   04/05/17 1209 04/05/17 1409  BP: 91/76 (!) 147/100  Pulse: 90   Resp: 17 16  Temp: (!) 35.7 C (!) 36.1 C    Last Pain:  Vitals:   04/05/17 1409  TempSrc: Tympanic         Complications: No apparent anesthesia complications and Patient re-intubated

## 2017-04-05 NOTE — Anesthesia Preprocedure Evaluation (Signed)
Anesthesia Evaluation  Patient identified by MRN, date of birth, ID band Patient awake    Reviewed: Allergy & Precautions, NPO status , Patient's Chart, lab work & pertinent test results  Airway Mallampati: II       Dental  (+) Teeth Intact   Pulmonary sleep apnea , former smoker,  Sarcoidosis   breath sounds clear to auscultation       Cardiovascular hypertension, Pt. on medications  Rhythm:Regular     Neuro/Psych  Headaches, Anxiety Depression Bipolar Disorder    GI/Hepatic Neg liver ROS, GERD  Medicated,  Endo/Other  diabetes, Type 2Hypothyroidism   Renal/GU negative Renal ROS     Musculoskeletal   Abdominal   Peds negative pediatric ROS (+)  Hematology   Anesthesia Other Findings   Reproductive/Obstetrics                             Anesthesia Physical Anesthesia Plan  ASA: III  Anesthesia Plan: General   Post-op Pain Management:    Induction: Intravenous  PONV Risk Score and Plan: 1 and Ondansetron  Airway Management Planned: Natural Airway and Nasal Cannula  Additional Equipment:   Intra-op Plan:   Post-operative Plan:   Informed Consent: I have reviewed the patients History and Physical, chart, labs and discussed the procedure including the risks, benefits and alternatives for the proposed anesthesia with the patient or authorized representative who has indicated his/her understanding and acceptance.     Plan Discussed with: CRNA  Anesthesia Plan Comments:         Anesthesia Quick Evaluation

## 2017-04-05 NOTE — H&P (Signed)
Outpatient short stay form Pre-procedure 04/05/2017 1:16 PM Jennifer DeemMartin U Chukwuebuka Churchill MD  Primary Physician: Dr. Aram BeechamJeffrey Sparks  Reason for visit:  Colonoscopy  History of present illness:  Patient is a 51 year old female presenting today as above. She does have problems with constipation. Takes multiple medications that could be contributory to this. Patient also has some associated right lower quadrant discomfort. Family history of colon cancer and a primary relative. Patient's last colonoscopy was about 8 years ago. She tolerated her prep well. She takes no aspirin or blood thinning agents.    Current Facility-Administered Medications:  .  0.9 %  sodium chloride infusion, , Intravenous, Continuous, Jennifer DeemSkulskie, Brendan Gruwell U, MD, Last Rate: 20 mL/hr at 04/05/17 1225 .  0.9 %  sodium chloride infusion, , Intravenous, Continuous, Jennifer DeemSkulskie, Ashan Cueva U, MD  Prescriptions Prior to Admission  Medication Sig Dispense Refill Last Dose  . ALPRAZolam (XANAX) 1 MG tablet Take 1 mg by mouth at bedtime as needed. For sleep   Taking  . Asenapine Maleate (SAPHRIS) 10 MG SUBL Place 10 mg under the tongue.   Taking  . atorvastatin (LIPITOR) 40 MG tablet Take 40 mg by mouth daily.    Taking  . benztropine (COGENTIN) 1 MG tablet Take by mouth 2 (two) times daily.    Taking  . buPROPion (WELLBUTRIN XL) 300 MG 24 hr tablet Take 300 mg by mouth daily.   Taking  . conjugated estrogens (PREMARIN) vaginal cream Place 1 Applicatorful vaginally daily. Apply 0.5mg  (pea-sized amount)  just inside the vaginal introitus with a finger-tip every night for two weeks and then Monday, Wednesday and Friday nights. (Patient not taking: Reported on 08/31/2016) 30 g 12 Not Taking  . docusate sodium (COLACE) 100 MG capsule Take by mouth.   Taking  . esomeprazole (NEXIUM) 20 MG packet Take 20 mg by mouth daily before breakfast.   Taking  . estradiol (ESTRACE VAGINAL) 0.1 MG/GM vaginal cream Apply 0.5mg  (pea-sized amount)  just inside the vaginal  introitus with a finger-tip every night for two weeks and then Monday, Wednesday and Friday nights. (Patient not taking: Reported on 08/31/2016) 30 g 12 Not Taking  . fish oil-omega-3 fatty acids 1000 MG capsule Take 1 g by mouth 2 (two) times daily.   Taking  . furosemide (LASIX) 20 MG tablet Take 20 mg by mouth daily.   Taking  . glimepiride (AMARYL) 2 MG tablet Take 1 mg by mouth at bedtime.    Taking  . glucose blood (ONE TOUCH ULTRA TEST) test strip TEST TWICE A DAY AS DIRECTED   Taking  . hyoscyamine (LEVSIN, ANASPAZ) 0.125 MG tablet Take 0.125 mg by mouth every 6 (six) hours as needed for cramping.   Not Taking  . lamoTRIgine (LAMICTAL) 25 MG tablet Take 25 mg by mouth 3 (three) times daily.    Taking  . levothyroxine (SYNTHROID, LEVOTHROID) 150 MCG tablet Take 150 mcg by mouth daily.    Taking  . liothyronine (CYTOMEL) 5 MCG tablet TAKE ONE TABLET TWICE DAILY   Taking  . meclizine (ANTIVERT) 32 MG tablet Take 1 tablet (32 mg total) by mouth 3 (three) times daily as needed. 30 tablet 0   . modafinil (PROVIGIL) 200 MG tablet Take 200 mg by mouth daily.   Taking  . Multiple Vitamin (MULITIVITAMIN WITH MINERALS) TABS Take 1 tablet by mouth daily.   Not Taking  . nadolol (CORGARD) 20 MG tablet Take by mouth at bedtime.    07/23/2016 at 1800  . Nutritional Supplements (  MELATONIN PO) Take 10 mg by mouth daily.   Taking  . ONETOUCH DELICA LANCETS FINE MISC USE AS DIRECTED   Taking  . Ospemifene (OSPHENA) 60 MG TABS Take 1 tablet by mouth daily. 30 tablet 11   . predniSONE (DELTASONE) 10 MG tablet Take 30 mg by mouth daily with breakfast.   Taking  . primidone (MYSOLINE) 50 MG tablet Take 100 mg by mouth at bedtime.    Taking  . propranolol (INDERAL) 60 MG tablet Take 60 mg by mouth 2 (two) times daily.   Taking  . sitaGLIPtin-metformin (JANUMET) 50-1000 MG per tablet TAKE ONE TABLET BY MOUTH TWICE DAILY   Taking  . topiramate (TOPAMAX) 200 MG tablet Take 200 mg by mouth 2 (two) times daily.     Taking  . Vilazodone HCl (VIIBRYD) 40 MG TABS Take 40 mg by mouth daily.   Taking     Allergies  Allergen Reactions  . Cleocin [Clindamycin Hcl] Other (See Comments)    GI distress     Past Medical History:  Diagnosis Date  . Acid reflux   . Anemia   . Anxiety   . Bipolar disorder (HCC)   . Depression   . Diabetes mellitus    NIDD  . Essential tremor   . Hyperlipidemia   . Hypothyroid   . IBS (irritable bowel syndrome)   . Migraine headache    vestibular migraine  . Obesity   . Osteopenia   . Restless leg syndrome   . Sarcoidosis   . Sleep apnea   . Syncope \    Review of systems:      Physical Exam    Heart and lungs: Regular rate and rhythm without rub or gallop, lungs are bilaterally clear.    HEENT: Normocephalic atraumatic eyes are anicteric    Other:     Pertinant exam for procedure: Soft nontender nondistended bowel sounds positive normoactive    Planned proceedures: Colonoscopy and indicated procedures. I have discussed the risks benefits and complications of procedures to include not limited to bleeding, infection, perforation and the risk of sedation and the patient wishes to proceed.    Jennifer Deem, MD Gastroenterology 04/05/2017  1:16 PM

## 2017-04-05 NOTE — Anesthesia Post-op Follow-up Note (Cosign Needed)
Anesthesia QCDR form completed.        

## 2017-04-05 NOTE — Op Note (Signed)
[facility] Gastroenterology Patient Name: Jennifer Bright Procedure Date: 04/05/2017 12:58 PM MRN: [id number] Account #: [id number] Date of Birth: Feb 27, 1966 Admit Type: Outpatient Age: 51 Room: [facility] ENDO ROOM 1 Gender: Female Note Status: Finalized Procedure:            Colonoscopy Indications:          Family history of colon cancer in a first-degree                        relative Providers:            Christena Deem, MD Referring MD:         Duane Lope. Judithann Sheen, MD (Referring MD) Medicines:            Monitored Anesthesia Care Complications:        No immediate complications. Procedure:            Pre-Anesthesia Assessment:                       - ASA Grade Assessment: III - A patient with severe                        systemic disease.                       After obtaining informed consent, the colonoscope was                        passed under direct vision. Throughout the procedure,                        the patient's blood pressure, pulse, and oxygen                        saturations were monitored continuously. The                        Colonoscope was introduced through the anus and                        advanced to the the cecum, identified by appendiceal                        orifice and ileocecal valve. The colonoscopy was                        unusually difficult due to a tortuous colon. Successful                        completion of the procedure was aided by changing the                        patient to a supine position, changing the patient to a                        prone position and using manual pressure. The quality                        of the bowel preparation was good. Findings:      The sigmoid colon, descending colon, transverse colon and  ascending       colon were significantly redundant.      Multiple small to medium diverticula were found in the sigmoid colon and       descending colon.      The exam was otherwise  without abnormality.      Hemorrhoids were found during retroflexion, during digital exam and       during anoscopy. The hemorrhoids were small and Grade II (internal       hemorrhoids that prolapse but reduce spontaneously). Impression:           - Redundant colon.                       - Diverticulosis in the sigmoid colon and in the                        descending colon.                       - The examination was otherwise normal.                       - Hemorrhoids.                       - No specimens collected. Recommendation:       - Miralax 1 capful (17 grams) in 8 ounces of water PO                        BID.                       - Return to GI clinic in 3 weeks. Procedure Code(s):    --- Professional ---                       [phone], Colonoscopy, flexible; diagnostic, including                        collection of specimen(s) by brushing or washing, when                        performed (separate procedure) Diagnosis Code(s):    --- Professional ---                       K64.1, Second degree hemorrhoids                       Z80.0, Family history of malignant neoplasm of                        digestive organs                       K57.30, Diverticulosis of large intestine without                        perforation or abscess without bleeding                       Q43.8, Other specified congenital malformations of                        intestine CPT  copyright 2016 [employer]. All rights reserved. The codes documented in this report are preliminary and upon coder review may  be revised to meet current compliance requirements. Christena DeemMartin U Aolanis Crispen, MD 04/05/2017 2:30:25 PM This report has been signed electronically. Number of Addenda: 0 Note Initiated On: 04/05/2017 12:58 PM Scope Withdrawal Time: 0 hours 10 minutes 26 seconds  Total Procedure Duration: 0 hours 38 minutes 5 seconds       [facility]

## 2017-04-05 NOTE — Anesthesia Procedure Notes (Signed)
Date/Time: 04/05/2017 1:17 PM Performed by: Junious SilkNOLES, Krystelle Prashad Pre-anesthesia Checklist: Patient identified, Emergency Drugs available, Suction available, Patient being monitored and Timeout performed Oxygen Delivery Method: Nasal cannula

## 2017-04-06 ENCOUNTER — Encounter: Payer: Self-pay | Admitting: Gastroenterology

## 2017-04-07 NOTE — Anesthesia Postprocedure Evaluation (Signed)
Anesthesia Post Note  Patient: Jennifer Bright  Procedure(s) Performed: Procedure(s) (LRB): COLONOSCOPY WITH PROPOFOL (N/A)  Patient location during evaluation: PACU Anesthesia Type: General Level of consciousness: awake Pain management: pain level controlled Vital Signs Assessment: post-procedure vital signs reviewed and stable Respiratory status: spontaneous breathing Cardiovascular status: stable Anesthetic complications: no     Last Vitals:  Vitals:   04/05/17 1429 04/05/17 1439  BP: 122/69 117/87  Pulse: 73 (!) 57  Resp: (!) 21 18  Temp:      Last Pain:  Vitals:   04/06/17 0725  TempSrc:   PainSc: 0-No pain                 VAN STAVEREN,Celestino Ackerman

## 2017-04-19 ENCOUNTER — Emergency Department
Admission: EM | Admit: 2017-04-19 | Discharge: 2017-04-19 | Disposition: A | Discharge status: Home / Self Care | Payer: Medicare Other | Attending: Emergency Medicine | Admitting: Emergency Medicine

## 2017-04-19 ENCOUNTER — Encounter: Payer: Self-pay | Admitting: Emergency Medicine

## 2017-04-19 ENCOUNTER — Emergency Department: Payer: Medicare Other

## 2017-04-19 DIAGNOSIS — Y999 Unspecified external cause status: Secondary | ICD-10-CM | POA: Diagnosis not present

## 2017-04-19 DIAGNOSIS — S99912A Unspecified injury of left ankle, initial encounter: Secondary | ICD-10-CM | POA: Diagnosis present

## 2017-04-19 DIAGNOSIS — Y939 Activity, unspecified: Secondary | ICD-10-CM | POA: Diagnosis not present

## 2017-04-19 DIAGNOSIS — S93402A Sprain of unspecified ligament of left ankle, initial encounter: Secondary | ICD-10-CM | POA: Insufficient documentation

## 2017-04-19 DIAGNOSIS — Z87891 Personal history of nicotine dependence: Secondary | ICD-10-CM | POA: Diagnosis not present

## 2017-04-19 DIAGNOSIS — W109XXA Fall (on) (from) unspecified stairs and steps, initial encounter: Secondary | ICD-10-CM | POA: Insufficient documentation

## 2017-04-19 DIAGNOSIS — E119 Type 2 diabetes mellitus without complications: Secondary | ICD-10-CM | POA: Diagnosis not present

## 2017-04-19 DIAGNOSIS — Y9289 Other specified places as the place of occurrence of the external cause: Secondary | ICD-10-CM | POA: Diagnosis not present

## 2017-04-19 DIAGNOSIS — I10 Essential (primary) hypertension: Secondary | ICD-10-CM | POA: Diagnosis not present

## 2017-04-19 DIAGNOSIS — Z79899 Other long term (current) drug therapy: Secondary | ICD-10-CM | POA: Insufficient documentation

## 2017-04-19 MED ORDER — DEXAMETHASONE SODIUM PHOSPHATE 10 MG/ML IJ SOLN
10.0000 mg | Freq: Once | INTRAMUSCULAR | Status: AC
  Administered 2017-04-19: 10 mg via INTRAMUSCULAR
  Filled 2017-04-19: qty 1

## 2017-04-19 MED ORDER — PREDNISONE 10 MG PO TABS: 10.0000 mg | ORAL_TABLET | Freq: Every day | ORAL | 0 refills | Status: DC

## 2017-04-19 MED ORDER — OXYCODONE HCL 5 MG PO TABS
5.0000 mg | ORAL_TABLET | Freq: Once | ORAL | Status: AC
  Administered 2017-04-19: 5 mg via ORAL
  Filled 2017-04-19: qty 1

## 2017-04-19 MED ORDER — OXYCODONE HCL 5 MG PO TABS: 5.0000 mg | ORAL_TABLET | Freq: Three times a day (TID) | ORAL | 0 refills | Status: AC | PRN

## 2017-04-19 NOTE — ED Triage Notes (Signed)
Pt arrived to the ED accompanied by her husband via wheel chair for complaints of left ankle pain secondary to injury. Pt states that she fell down some steps and that she rolled her ankle in the process. Pt is AOx4 in no apparent distress with intact sensation and circulation with limited range of motion.

## 2017-04-19 NOTE — ED Provider Notes (Signed)
[facility] Emergency Department Provider Note  ____________________________________________  Time seen: Approximately 10:04 PM  I have reviewed the triage vital signs and the nursing notes.   HISTORY  Chief Complaint Ankle Pain    HPI Jennifer Bright is a 51 y.o. female who presents emergency department complaining of left ankle pain and swelling. Patient reports that she was at a friend's house when she missed a step on the deck twisting her ankle. Patient reports that she fell onto her knees and caught herself with her hands. She endorses moderate abrasions bilateral knees but is complaining mostly of left ankle pain. Patient reports significant swelling to the area. She is unable to bear weight on ankle. No other injury or complaint. No medications prior to arrival. Patient did not hit her head or lose consciousness at any time.  Patient has stage III kidney failure and liver failure. She is unable to take Tylenol and NSAIDs due to this.   Past Medical History:  Diagnosis Date  . Acid reflux   . Anemia   . Anxiety   . Bipolar disorder (HCC)   . Depression   . Diabetes mellitus    NIDD  . Essential tremor   . Hyperlipidemia   . Hypothyroid   . IBS (irritable bowel syndrome)   . Migraine headache    vestibular migraine  . Obesity   . Osteopenia   . Restless leg syndrome   . Sarcoidosis   . Sleep apnea   . Syncope \    Patient Active Problem List   Diagnosis Date Noted  . Polypharmacy 08/14/2016  . 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 Hypercalcemia 07/24/2016  . Sarcoidosis 07/24/2016  . Hyperlipidemia, unspecified 11/15/2015  . Type 2 diabetes mellitus without complication, without long-term current use of insulin (HCC) 11/15/2015  . Anxiety disorder 07/29/2015  . Anxiety 05/12/2015  . Bipolar depression (HCC) 05/12/2015  . Essential (primary) hypertension 05/12/2015  . Benign essential tremor 05/12/2015  . Gastro-esophageal  reflux disease without esophagitis 05/12/2015  . Cephalalgia 05/12/2015  . Hypersomnia with sleep apnea 05/12/2015  . Restless leg 05/12/2015  . Diabetes (HCC) 05/12/2015  . Hypothyroidism 05/12/2015  . Migraine variant 05/12/2015  . Bipolar disorder (HCC) 05/12/2015  . Type 2 diabetes mellitus without complications (HCC) 05/12/2015  . Chronic migraine without aura 09/03/2012  . 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 Syncope and collapse 03/17/2012  . Depression 03/17/2012  . Migraine 03/17/2012    Past Surgical History:  Procedure Laterality Date  . ABDOMINAL HYSTERECTOMY    . APPENDECTOMY    . BLADDER SURGERY     bladder tact  . CESAREAN SECTION    . CHOLECYSTECTOMY    . COLONOSCOPY WITH PROPOFOL N/A 04/05/2017   Procedure: COLONOSCOPY WITH PROPOFOL;  Surgeon: Christena Deem, MD;  Location: [facility] ENDOSCOPY;  Service: Endoscopy;  Laterality: N/A;  . ESOPHAGOGASTRODUODENOSCOPY (EGD) WITH PROPOFOL N/A 08/28/2016   Procedure: ESOPHAGOGASTRODUODENOSCOPY (EGD) WITH PROPOFOL;  Surgeon: Christena Deem, MD;  Location: [facility] ENDOSCOPY;  Service: Endoscopy;  Laterality: N/A;  . HERNIA REPAIR    . INGUINAL HERNIA REPAIR     rt and left  . lexiscan cardiolite    . TILT TABLE STUDY N/A 04/01/2012   Procedure: TILT TABLE STUDY;  Surgeon: Duke Salvia, MD;  Location: [facility] CATH LAB;  Service: Cardiovascular;  Laterality: N/A;    Prior to Admission medications   Medication Sig Start Date End Date Taking? Authorizing Provider  ALPRAZolam Prudy Feeler) 1 MG  tablet Take 1 mg by mouth at bedtime as needed. For sleep    [provider]  Asenapine Maleate (SAPHRIS) 10 MG SUBL Place 10 mg under the tongue.    [provider]  atorvastatin (LIPITOR) 40 MG tablet Take 40 mg by mouth daily.     [provider]  benztropine (COGENTIN) 1 MG tablet Take by mouth 2 (two) times daily.     [provider]  buPROPion (WELLBUTRIN XL) 300 MG 24 hr tablet Take 300  mg by mouth daily.    [provider]  conjugated estrogens (PREMARIN) vaginal cream Place 1 Applicatorful vaginally daily. Apply 0.5mg  (pea-sized amount)  just inside the vaginal introitus with a finger-tip every night for two weeks and then Monday, Wednesday and Friday nights. Patient not taking: Reported on 08/31/2016 07/17/16   Michiel Cowboy A, PA-C  docusate sodium (COLACE) 100 MG capsule Take by mouth.    [provider]  esomeprazole (NEXIUM) 20 MG packet Take 20 mg by mouth daily before breakfast.    [provider]  estradiol (ESTRACE VAGINAL) 0.1 MG/GM vaginal cream Apply 0.5mg  (pea-sized amount)  just inside the vaginal introitus with a finger-tip every night for two weeks and then Monday, Wednesday and Friday nights. Patient not taking: Reported on 08/31/2016 07/17/16   Michiel Cowboy A, PA-C  fish oil-omega-3 fatty acids 1000 MG capsule Take 1 g by mouth 2 (two) times daily.    [provider]  furosemide (LASIX) 20 MG tablet Take 20 mg by mouth daily.    [provider]  glimepiride (AMARYL) 2 MG tablet Take 1 mg by mouth at bedtime.  06/12/16   [provider]  glucose blood (ONE TOUCH ULTRA TEST) test strip TEST TWICE A DAY AS DIRECTED 07/15/16   [provider]  hyoscyamine (LEVSIN, ANASPAZ) 0.125 MG tablet Take 0.125 mg by mouth every 6 (six) hours as needed for cramping.    [provider]  lamoTRIgine (LAMICTAL) 25 MG tablet Take 25 mg by mouth 3 (three) times daily.     [provider]  levothyroxine (SYNTHROID, LEVOTHROID) 150 MCG tablet Take 150 mcg by mouth daily.     [provider]  liothyronine (CYTOMEL) 5 MCG tablet TAKE ONE TABLET TWICE DAILY 05/11/16   [provider]  meclizine (ANTIVERT) 32 MG tablet Take 1 tablet (32 mg total) by mouth 3 (three) times daily as needed. 02/09/17   Enid Derry, PA-C  modafinil (PROVIGIL) 200 MG tablet Take 200 mg by mouth daily.     [provider]  Multiple Vitamin (MULITIVITAMIN WITH MINERALS) TABS Take 1 tablet by mouth daily.    [provider]  nadolol (CORGARD) 20 MG tablet Take by mouth at bedtime.  07/29/15 07/28/16  [provider]  Nutritional Supplements (MELATONIN PO) Take 10 mg by mouth daily.    [provider]  Dola Argyle LANCETS FINE MISC USE AS DIRECTED 07/15/16   [provider]  Ospemifene (OSPHENA) 60 MG TABS Take 1 tablet by mouth daily. 03/19/17   Nadara Mustard, MD  oxyCODONE (ROXICODONE) 5 MG immediate release tablet Take 1 tablet (5 mg total) by mouth every 8 (eight) hours as needed. 04/19/17 04/19/18  Cuthriell, Delorise Royals, PA-C  predniSONE (DELTASONE) 10 MG tablet Take 1 tablet (10 mg total) by mouth daily. 04/19/17   Cuthriell, Delorise Royals, PA-C  primidone (MYSOLINE) 50 MG tablet Take 100 mg by mouth at bedtime.     [provider]  propranolol (INDERAL) 60 MG tablet Take 60 mg by mouth 2 (two) times daily.    [provider]  sitaGLIPtin-metformin (JANUMET) 50-1000 MG per tablet TAKE ONE TABLET BY MOUTH TWICE DAILY 04/23/15   [provider]  topiramate (TOPAMAX) 200 MG tablet Take 200 mg by mouth 2 (two) times daily.     [provider]  Vilazodone HCl (VIIBRYD) 40 MG TABS Take 40 mg by mouth daily.    [provider]    Allergies Cleocin [clindamycin hcl]  Family History  Problem Relation Age of Onset  . Cancer Father   . Hypertension Mother   . Hyperlipidemia Mother   . Alcohol abuse Brother   . Irritable bowel syndrome Sister   . Kidney cancer Neg Hx   . Kidney disease Neg Hx   . Prostate cancer Neg Hx     Social History Social History  Substance Use Topics  . Smoking status: Former Smoker    Types: Cigarettes  . Smokeless tobacco: Never Used  . Alcohol use No     Review of Systems  Constitutional: No fever/chills Eyes: No visual changes.  Cardiovascular: no chest pain. Respiratory: no  cough. No SOB. Gastrointestinal: No abdominal pain.  No nausea, no vomiting.   Musculoskeletal: Positive for left ankle pain and swelling Skin: Negative for rash, abrasions, lacerations, ecchymosis. Neurological: Negative for headaches, focal weakness or numbness. 10-point ROS otherwise negative.  ____________________________________________   PHYSICAL EXAM:  VITAL SIGNS: ED Triage Vitals  Enc Vitals Group     BP 04/19/17 2054 (!) 105/49     Pulse Rate 04/19/17 2054 89     Resp 04/19/17 2054 18     Temp 04/19/17 2054 98.3 F (36.8 C)     Temp Source 04/19/17 2054 Oral     SpO2 04/19/17 2054 96 %     Weight 04/19/17 2055 154 lb (69.9 kg)     Height 04/19/17 2055 5\' 4"  (1.626 m)     Head Circumference --      Peak Flow --      Pain Score 04/19/17 2054 8     Pain Loc --      Pain Edu? --      Excl. in GC? --      Constitutional: Alert and oriented. Well appearing and in no acute distress. Eyes: Conjunctivae are normal. PERRL. EOMI. Head: Atraumatic. Neck: No stridor.    Cardiovascular: Normal rate, regular rhythm. Normal S1 and S2.  Good peripheral circulation. Respiratory: Normal respiratory effort without tachypnea or retractions. Lungs CTAB. Good air entry to the bases with no decreased or absent breath sounds. Musculoskeletal: Limited range of motion to the left ankle due to pain. Significant edema is appreciated to the left ankle. No gross deformity. Upon coaxing, patient is able to move ankle and slightly in all directions. Patient has significant tenderness to both the anterior and lateral aspect of the ankle. No palpable abnormality. Dorsalis pedis pulse intact. Sensation intact all 5 digits left foot. Neurologic:  Normal speech and language. No gross focal neurologic deficits are appreciated.  Skin:  Skin is warm, dry and intact. No rash noted. Superficial abrasions noted to bilateral knees. No bleeding. No foreign body. Psychiatric: Mood and affect are normal. Speech  and behavior are normal. Patient exhibits appropriate insight and judgement.   ____________________________________________   LABS (all labs ordered are listed, but only abnormal results are displayed)  Labs Reviewed - No data to display ____________________________________________  EKG   ____________________________________________  RADIOLOGY I, Delorise RoyalsJonathan D Cuthriell, personally viewed and evaluated these images (plain radiographs) as part of my medical decision making, as well as reviewing the written report by the radiologist.  Dg Ankle Complete Left  Result Date: 04/19/2017 CLINICAL DATA:  Injury to the left ankle today with left ankle pain. EXAM: LEFT ANKLE COMPLETE - 3+ VIEW COMPARISON:  None. FINDINGS: There is no evidence of fracture, dislocation, or joint effusion. There is no evidence of arthropathy or other focal bone abnormality. There is marked soft tissue swelling in the lateral ankle. IMPRESSION: No acute fracture or dislocation. Marked soft tissue swelling of the lateral ankle. Electronically Signed   By: Sherian ReinWei-Chen  Lin M.D.   On: 04/19/2017 21:23    ____________________________________________    PROCEDURES  Procedure(s) performed:    Procedures    Medications  oxyCODONE (Oxy IR/ROXICODONE) immediate release tablet 5 mg (5 mg Oral Given 04/19/17 2245)  dexamethasone (DECADRON) injection 10 mg (10 mg Intramuscular Given 04/19/17 2244)     ____________________________________________   INITIAL IMPRESSION / ASSESSMENT AND PLAN / ED COURSE  Pertinent labs & imaging results that were available during my care of the patient were reviewed by me and considered in my medical decision making (see chart for details).  Review of the [place] CSRS was performed in accordance of the [facility] prior to dispensing any controlled drugs.     Patient's diagnosis is consistent with Fall resulting in left ankle sprain. Patient's x-ray was reassuring with no acute osseous abnormality.  Patient does have significant edema and tenderness to palpation but no indication of acute ligamentous tear. Patient's ankle is wrapped with Ace bandage and she is given crutches for ambulation. Patient will be discharged home with prescriptions for limited pain medication and steroids for anti-inflammatory properties.. Patient is to follow up with podiatry as needed or otherwise directed. Patient is given ED precautions to return to the ED for any worsening or new symptoms.     ____________________________________________  FINAL CLINICAL IMPRESSION(S) / ED DIAGNOSES  Final diagnoses:  Sprain of left ankle, unspecified ligament, initial encounter      NEW MEDICATIONS STARTED DURING THIS VISIT:  Discharge Medication List as of 04/19/2017 10:42 PM    START taking these medications   Details  oxyCODONE (ROXICODONE) 5 MG immediate release tablet Take 1 tablet (5 mg total) by mouth every 8 (eight) hours as needed., Starting Mon 04/19/2017, Until Tue 04/19/2018, Print            This chart was dictated using voice recognition software/Dragon. Despite best efforts to proofread, errors can occur which can change the meaning. Any change was purely unintentional.    Racheal PatchesCuthriell, Jonathan D, PA-C 04/19/17 2326    Phineas SemenGoodman, [place], MD 04/19/17 [phone]

## 2017-05-28 ENCOUNTER — Ambulatory Visit: Payer: Medicare Other | Admitting: Obstetrics & Gynecology

## 2017-07-12 ENCOUNTER — Encounter: Payer: Self-pay | Admitting: Obstetrics & Gynecology

## 2017-07-12 ENCOUNTER — Ambulatory Visit (INDEPENDENT_AMBULATORY_CARE_PROVIDER_SITE_OTHER): Payer: Medicare Other | Admitting: Obstetrics & Gynecology

## 2017-07-12 VITALS — BP 120/70 | HR 92 | Ht 64.0 in | Wt 152.0 lb

## 2017-07-12 DIAGNOSIS — Z0000 Encounter for general adult medical examination without abnormal findings: Secondary | ICD-10-CM | POA: Diagnosis not present

## 2017-07-12 DIAGNOSIS — Z1239 Encounter for other screening for malignant neoplasm of breast: Secondary | ICD-10-CM

## 2017-07-12 DIAGNOSIS — Z1231 Encounter for screening mammogram for malignant neoplasm of breast: Secondary | ICD-10-CM

## 2017-07-12 DIAGNOSIS — N952 Postmenopausal atrophic vaginitis: Secondary | ICD-10-CM | POA: Insufficient documentation

## 2017-07-12 MED ORDER — OSPEMIFENE 60 MG PO TABS: 1.0000 | ORAL_TABLET | Freq: Every day | ORAL | 11 refills | Status: DC

## 2017-07-12 NOTE — Patient Instructions (Addendum)
PAP every three years Mammogram every year    Call [phone] to schedule at [facility] Colonoscopy every 10 years Labs yearly (with PCP)  Vaginal Sx's- 1. Replens vag therapy 2. Ospemifene oral tablets What is this medicine? OSPEMIFENE (os PEM i feen) is used to treat painful sexual intercourse in females after menopause, a symptom of menopause that occurs due to changes in and around the vagina. This medicine may be used for other purposes; ask your health care provider or pharmacist if you have questions. COMMON BRAND NAME(S): Osphena What should I tell my health care provider before I take this medicine? They need to know if you have any of these conditions: -cancer, such as breast, uterine, or other cancer -heart disease -history of blood clots -history of stroke -history of vaginal bleeding -liver disease -premenopausal -smoke tobacco -an unusual or allergic reaction to ospemifene, other medicines, foods, dyes, or preservatives -pregnant or trying to get pregnant -breast-feeding How should I use this medicine? Take this medicine by mouth with a glass of water. Take this medicine with food. Follow the directions on the prescription label. Do not take your medicine more often than directed. Talk to your pediatrician regarding the use of this medicine in children. Special care may be needed. Overdosage: If you think you have taken too much of this medicine contact a poison control center or emergency room at once. NOTE: This medicine is only for you. Do not share this medicine with others. What if I miss a dose? If you miss a dose, take it as soon as you can. If it is almost time for your next dose, take only that dose. Do not take double or extra doses. What may interact with this medicine? -doxycycline -estrogens -fluconazole -furosemide -glyburide -ketoconazole -phenytoin -rifampin -warfarin This list may not describe all possible interactions. Give your health care  provider a list of all the medicines, herbs, non-prescription drugs, or dietary supplements you use. Also tell them if you smoke, drink alcohol, or use illegal drugs. Some items may interact with your medicine. What should I watch for while using this medicine? Visit your health care professional for regular checks on your progress. You will need a regular breast and pelvic exam and Pap smear while on this medicine. You should also discuss the need for regular mammograms with your health care professional, and follow his or her guidelines for these tests. Also, periodically discuss the need to continue taking this medicine. Taking this medicine for long periods of time may increase your risk for serious side effects. This medicine can increase the risk of developing a condition (endometrial hyperplasia) that may lead to cancer of the lining of the uterus. Taking progestins, another hormone drug, with this medicine lowers the risk of developing this condition. Therefore, if your uterus has not been removed (by a hysterectomy), your doctor may prescribe a progestin for you to take together with your estrogen. You should know, however, that taking estrogens with progestins may have additional health risks. You should discuss the use of estrogens and progestins with your health care professional to determine the benefits and risks for you. This medicine can rarely cause blood clots. You should avoid long periods of bed rest while taking this medicine. If you are going to have surgery, tell your doctor or health care professional that you are taking this medicine. This medicine should be stopped at least 4-6 weeks before surgery. After surgery, it should be restarted only after you are walking again. It should  not be restarted while you still need long periods of bed rest. You should not smoke while taking this medicine. Smoking may also increase your risk of blood clots. Smoking can also decrease the effects of  this medicine. This medicine does not prevent hot flashes. It may cause hot flashes in some patients. If you have any reason to think you are pregnant; stop taking this medicine at once and contact your doctor or health care professional. What side effects may I notice from receiving this medicine? Side effects that you should report to your doctor or health care professional as soon as possible: -breathing problems -changes in vision -confusion, trouble speaking or understanding -new breast lumps -pain, swelling, warmth in the leg -pelvic pain or pressure -severe headaches -sudden chest pain -sudden numbness or weakness of the face, arm or leg -trouble walking, dizziness, loss of balance or coordination -unusual vaginal bleeding patterns -vaginal discharge that is bloody or brown Side effects that usually do not require medical attention (report to your doctor or health care professional if they continue or are bothersome): -hot flushes or flashes -increased sweating -muscle cramps -vaginal discharge (white or clear) This list may not describe all possible side effects. Call your doctor for medical advice about side effects. You may report side effects to FDA at 1-800-FDA-1088. Where should I keep my medicine? Keep out of the reach of children. Store at room temperature between 20 and 25 degrees C (68 and 77 degrees F). Protect from light. Keep container tightly closed. Throw away any unused medicine after the expiration date. NOTE: This sheet is a summary. It may not cover all possible information. If you have questions about this medicine, talk to your doctor, pharmacist, or health care provider.  2018 Elsevier/Gold Standard (2015-11-07 10:08:00)

## 2017-07-12 NOTE — Progress Notes (Signed)
HPI:      [name] is a 51 y.o. [id number] who LMP was in the past,she presents today for her annual examination.  The patient has no complaints today. The patient is sexually active. Herlast pap: approximate date 2016 and was normal and last mammogram: approximate date 2017 and was normal.  The patient does perform self breast exams.  There is no notable family history of breast or ovarian cancer in her family. The patient is not taking hormone replacement therapy. Patient denies post-menopausal vaginal bleeding.   The patient has regular exercise: yes. The patient denies current symptoms of depression.    Osphena started for vag dryness and dyspareunia 3 mos ago.  Only took samples so far (2 weeks).  GYN Hx: Last Colonoscopy:3 mos ago. Normal.  Last DEXA: never ago.    PMHx: Past Medical History:  Diagnosis Date  . Acid reflux   . Anemia   . Anxiety   . Bipolar disorder (HCC)   . Depression   . Diabetes mellitus    NIDD  . Essential tremor   . Hyperlipidemia   . Hypothyroid   . IBS (irritable bowel syndrome)   . Migraine headache    vestibular migraine  . Obesity   . Osteopenia   . Restless leg syndrome   . Sarcoidosis   . Sleep apnea   . Syncope \   Past Surgical History:  Procedure Laterality Date  . ABDOMINAL HYSTERECTOMY    . APPENDECTOMY    . BLADDER SURGERY     bladder tact  . CESAREAN SECTION    . CHOLECYSTECTOMY    . COLONOSCOPY WITH PROPOFOL N/A 04/05/2017   Procedure: COLONOSCOPY WITH PROPOFOL;  Surgeon: Christena Deem, MD;  Location: [facility] ENDOSCOPY;  Service: Endoscopy;  Laterality: N/A;  . ESOPHAGOGASTRODUODENOSCOPY (EGD) WITH PROPOFOL N/A 08/28/2016   Procedure: ESOPHAGOGASTRODUODENOSCOPY (EGD) WITH PROPOFOL;  Surgeon: Christena Deem, MD;  Location: [facility] ENDOSCOPY;  Service: Endoscopy;  Laterality: N/A;  . HERNIA REPAIR    . INGUINAL HERNIA REPAIR     rt and left  . lexiscan cardiolite    . TILT TABLE STUDY N/A 04/01/2012   Procedure:  TILT TABLE STUDY;  Surgeon: Duke Salvia, MD;  Location: [facility] CATH LAB;  Service: Cardiovascular;  Laterality: N/A;   Family History  Problem Relation Age of Onset  . Cancer Father   . Hypertension Mother   . Hyperlipidemia Mother   . Alcohol abuse Brother   . Irritable bowel syndrome Sister   . Kidney cancer Neg Hx   . Kidney disease Neg Hx   . Prostate cancer Neg Hx    Social History  Substance Use Topics  . Smoking status: Former Smoker    Types: Cigarettes  . Smokeless tobacco: Never Used  . Alcohol use No    Current Outpatient Prescriptions:  .  ALPRAZolam (XANAX) 1 MG tablet, Take 1 mg by mouth at bedtime as needed. For sleep, Disp: , Rfl:  .  Asenapine Maleate (SAPHRIS) 10 MG SUBL, Place 10 mg under the tongue., Disp: , Rfl:  .  atorvastatin (LIPITOR) 40 MG tablet, Take 40 mg by mouth daily. , Disp: , Rfl:  .  benztropine (COGENTIN) 1 MG tablet, Take by mouth 2 (two) times daily. , Disp: , Rfl:  .  buPROPion (WELLBUTRIN XL) 300 MG 24 hr tablet, Take 300 mg by mouth daily., Disp: , Rfl:  .  docusate sodium (COLACE) 100 MG capsule, Take by mouth., Disp: ,  Rfl:  .  esomeprazole (NEXIUM) 20 MG packet, Take 20 mg by mouth daily before breakfast., Disp: , Rfl:  .  fish oil-omega-3 fatty acids 1000 MG capsule, Take 1 g by mouth 2 (two) times daily., Disp: , Rfl:  .  furosemide (LASIX) 20 MG tablet, Take 20 mg by mouth daily., Disp: , Rfl:  .  glimepiride (AMARYL) 2 MG tablet, Take 1 mg by mouth at bedtime. , Disp: , Rfl:  .  glucose blood (ONE TOUCH ULTRA TEST) test strip, TEST TWICE A DAY AS DIRECTED, Disp: , Rfl:  .  hyoscyamine (LEVSIN, ANASPAZ) 0.125 MG tablet, Take 0.125 mg by mouth every 6 (six) hours as needed for cramping., Disp: , Rfl:  .  lamoTRIgine (LAMICTAL) 25 MG tablet, Take 25 mg by mouth 3 (three) times daily. , Disp: , Rfl:  .  levothyroxine (SYNTHROID, LEVOTHROID) 150 MCG tablet, Take 150 mcg by mouth daily. , Disp: , Rfl:  .  liothyronine (CYTOMEL) 5 MCG  tablet, TAKE ONE TABLET TWICE DAILY, Disp: , Rfl:  .  meclizine (ANTIVERT) 32 MG tablet, Take 1 tablet (32 mg total) by mouth 3 (three) times daily as needed., Disp: 30 tablet, Rfl: 0 .  modafinil (PROVIGIL) 200 MG tablet, Take 200 mg by mouth daily., Disp: , Rfl:  .  Multiple Vitamin (MULITIVITAMIN WITH MINERALS) TABS, Take 1 tablet by mouth daily., Disp: , Rfl:  .  nadolol (CORGARD) 20 MG tablet, Take by mouth at bedtime. , Disp: , Rfl:  .  Nutritional Supplements (MELATONIN PO), Take 10 mg by mouth daily., Disp: , Rfl:  .  ONETOUCH DELICA LANCETS FINE MISC, USE AS DIRECTED, Disp: , Rfl:  .  Ospemifene (OSPHENA) 60 MG TABS, Take 1 tablet by mouth daily., Disp: 30 tablet, Rfl: 11 .  oxyCODONE (ROXICODONE) 5 MG immediate release tablet, Take 1 tablet (5 mg total) by mouth every 8 (eight) hours as needed., Disp: 12 tablet, Rfl: 0 .  predniSONE (DELTASONE) 10 MG tablet, Take 1 tablet (10 mg total) by mouth daily., Disp: 42 tablet, Rfl: 0 .  primidone (MYSOLINE) 50 MG tablet, Take 100 mg by mouth at bedtime. , Disp: , Rfl:  .  propranolol (INDERAL) 60 MG tablet, Take 60 mg by mouth 2 (two) times daily., Disp: , Rfl:  .  sitaGLIPtin-metformin (JANUMET) 50-1000 MG per tablet, TAKE ONE TABLET BY MOUTH TWICE DAILY, Disp: , Rfl:  .  topiramate (TOPAMAX) 200 MG tablet, Take 200 mg by mouth 2 (two) times daily. , Disp: , Rfl:  .  Vilazodone HCl (VIIBRYD) 40 MG TABS, Take 40 mg by mouth daily., Disp: , Rfl:  Allergies: Cleocin [clindamycin hcl]  Review of Systems  Constitutional: Negative for chills, fever and malaise/fatigue.  HENT: Negative for congestion, sinus pain and sore throat.   Eyes: Negative for blurred vision and pain.  Respiratory: Negative for cough and wheezing.   Cardiovascular: Negative for chest pain and leg swelling.  Gastrointestinal: Negative for abdominal pain, constipation, diarrhea, heartburn, nausea and vomiting.  Genitourinary: Negative for dysuria, frequency, hematuria and  urgency.  Musculoskeletal: Negative for back pain, joint pain, myalgias and neck pain.  Skin: Negative for itching and rash.  Neurological: Negative for dizziness, tremors and weakness.  Endo/Heme/Allergies: Does not bruise/bleed easily.  Psychiatric/Behavioral: Negative for depression. The patient is not nervous/anxious and does not have insomnia.     Objective: BP 120/70   Pulse 92   Ht  (1.626 m)   Wt 152 lb (68.9 kg)  BMI 26.09 kg/m   Filed Weights   07/12/17 0809  Weight: 152 lb (68.9 kg)   Body mass index is 26.09 kg/m. Physical Exam  Constitutional: She is oriented to person, place, and time. She appears well-developed and well-nourished. No distress.  Genitourinary: Rectum normal and vagina normal. Pelvic exam was performed with patient supine. There is no rash or lesion on the right labia. There is no rash or lesion on the left labia. Vagina exhibits no lesion. No bleeding in the vagina. Right adnexum does not display mass and does not display tenderness. Left adnexum does not display mass and does not display tenderness. Cervix does not exhibit lesion.  Genitourinary Comments: Absent Uterus Cervix normal Vag atrophy mild  HENT:  Head: Normocephalic and atraumatic. Head is without laceration.  Right Ear: Hearing normal.  Left Ear: Hearing normal.  Nose: No epistaxis.  No foreign bodies.  Mouth/Throat: Uvula is midline, oropharynx is clear and moist and mucous membranes are normal.  Eyes: Pupils are equal, round, and reactive to light.  Neck: Normal range of motion. Neck supple. No thyromegaly present.  Cardiovascular: Normal rate and regular rhythm.  Exam reveals no gallop and no friction rub.   No murmur heard. Pulmonary/Chest: Effort normal and breath sounds normal. No respiratory distress. She has no wheezes. Right breast exhibits no mass, no skin change and no tenderness. Left breast exhibits no mass, no skin change and no tenderness.  Abdominal: Soft. Bowel  sounds are normal. She exhibits no distension. There is no tenderness. There is no rebound.  Musculoskeletal: Normal range of motion.  Neurological: She is alert and oriented to person, place, and time. No cranial nerve deficit.  Skin: Skin is warm and dry.  Psychiatric: She has a normal mood and affect. Judgment normal.  Vitals reviewed.   Assessment: Annual Exam 1. Annual physical exam   2. Vaginal atrophy     Plan:            1.  Cervical Screening-  Pap smear schedule reviewed with patient, due 2019  2. Breast screening- Exam annually and mammogram scheduled [facility]  3. Colonoscopy every 10 years, Hemoccult testing after age 11  4. Labs managed by PCP  5. Counseling for hormonal therapy: no change in therapy today. Cont (restart) Osphena for full trial. Also Replens discussed to trial.    F/U  Return in about 1 year (around 07/12/2018) for Annual.  Annamarie Major, MD, [facility], [facility] 07/12/2017  8:51 AM

## 2017-07-19 ENCOUNTER — Telehealth: Payer: Self-pay | Admitting: Obstetrics & Gynecology

## 2017-07-19 NOTE — Telephone Encounter (Signed)
[employer] Appeals is calling about an medication request ofphena. Please call back . [phone] Lynden Ang

## 2017-07-19 NOTE — Telephone Encounter (Signed)
Jennifer Bright from [facility] has overturned prescription and is availble for patient. Authorization # [id number]. Effect 07/12/17 to 10/18/18

## 2017-07-19 NOTE — Telephone Encounter (Signed)
Pt called after hour nurse 07/16/17 5:31pm re RX Appeal.  She would like a call back.  [phone]

## 2017-07-19 NOTE — Telephone Encounter (Signed)
Insurance company needs to know why pt can  Not use estrogen product.Marland Kitchen

## 2017-07-20 ENCOUNTER — Encounter: Payer: Self-pay | Admitting: Obstetrics & Gynecology

## 2017-07-20 NOTE — Telephone Encounter (Signed)
Left message for pt to return call so I can let her know rx has been approved

## 2017-07-20 NOTE — Telephone Encounter (Signed)
What does this mean

## 2017-07-20 NOTE — Telephone Encounter (Signed)
Pt is retrning call. Please advise

## 2017-07-20 NOTE — Telephone Encounter (Signed)
Called no answer, If pt calls back just let her know RX has been approved

## 2017-07-20 NOTE — Telephone Encounter (Signed)
Pt is calling needing to know the other over the counter medications are. Pt report the medication her insurance has approved is to costly. Please advise patient

## 2017-07-20 NOTE — Telephone Encounter (Signed)
Replens

## 2017-07-20 NOTE — Telephone Encounter (Signed)
Pt aware.

## 2017-08-31 ENCOUNTER — Ambulatory Visit (HOSPITAL_COMMUNITY): Payer: Medicare Other

## 2017-09-02 ENCOUNTER — Ambulatory Visit (HOSPITAL_COMMUNITY): Payer: Medicare Other

## 2017-09-06 ENCOUNTER — Telehealth: Payer: Self-pay

## 2017-09-06 NOTE — Telephone Encounter (Signed)
I spoke to Jennifer Bright @ [facility] who confirmed the patient's mammo last year was normal, and that if she doesn't have any new breast problems she can call to schedule a screening mammogram. I spoke to the patient, and found that when she called our office for the phone#, she was given the phone# to Jennifer Bright, and they would need her prior imaging in order to schedule an appt. Phone# to [facility] was given upon the patient's request and she will contact them to schedule an appointment.

## 2017-09-06 NOTE — Telephone Encounter (Signed)
Pt calling to have breast exam done this year.  She is hoping to get it done 12/5th at 8am or 9am as that is when she is available and someone can drive her. Pt states they said we need to ref her and they need some things from us as well.  States they said she did not have an exam last year and there was a problem c her prior exam but she was not aware of it. [phone]  Per [facility] pt has AEX 07/01/16; [facility]16 was neg.

## 2017-09-06 NOTE — Telephone Encounter (Signed)
[facility] should be there

## 2017-12-30 DIAGNOSIS — M791 Myalgia, unspecified site: Secondary | ICD-10-CM | POA: Insufficient documentation

## 2018-02-08 ENCOUNTER — Other Ambulatory Visit: Payer: Self-pay | Admitting: Internal Medicine

## 2018-02-09 ENCOUNTER — Other Ambulatory Visit: Payer: Self-pay | Admitting: Internal Medicine

## 2018-02-18 ENCOUNTER — Other Ambulatory Visit: Payer: Self-pay | Admitting: Internal Medicine

## 2018-02-18 DIAGNOSIS — Z0000 Encounter for general adult medical examination without abnormal findings: Secondary | ICD-10-CM

## 2018-02-18 DIAGNOSIS — R1084 Generalized abdominal pain: Secondary | ICD-10-CM

## 2018-02-28 ENCOUNTER — Ambulatory Visit
Admission: RE | Admit: 2018-02-28 | Discharge: 2018-02-28 | Disposition: A | Discharge status: Home / Self Care | Payer: Medicare Other | Source: Ambulatory Visit | Attending: Internal Medicine | Admitting: Internal Medicine

## 2018-02-28 DIAGNOSIS — R1084 Generalized abdominal pain: Secondary | ICD-10-CM

## 2018-02-28 DIAGNOSIS — Z9049 Acquired absence of other specified parts of digestive tract: Secondary | ICD-10-CM | POA: Diagnosis not present

## 2018-02-28 DIAGNOSIS — Z0000 Encounter for general adult medical examination without abnormal findings: Secondary | ICD-10-CM

## 2018-04-25 NOTE — Telephone Encounter (Signed)
This encounter was created in error - please disregard.

## 2018-08-10 ENCOUNTER — Ambulatory Visit (INDEPENDENT_AMBULATORY_CARE_PROVIDER_SITE_OTHER): Payer: Medicare Other | Admitting: Obstetrics & Gynecology

## 2018-08-10 ENCOUNTER — Encounter: Payer: Self-pay | Admitting: Obstetrics & Gynecology

## 2018-08-10 ENCOUNTER — Other Ambulatory Visit (HOSPITAL_COMMUNITY)
Admission: RE | Admit: 2018-08-10 | Discharge: 2018-08-10 | Disposition: A | Discharge status: Home / Self Care | Payer: Medicare Other | Source: Ambulatory Visit | Attending: Obstetrics & Gynecology | Admitting: Obstetrics & Gynecology

## 2018-08-10 VITALS — BP 100/60 | Ht 64.0 in | Wt 161.0 lb

## 2018-08-10 DIAGNOSIS — Z1211 Encounter for screening for malignant neoplasm of colon: Secondary | ICD-10-CM | POA: Diagnosis not present

## 2018-08-10 DIAGNOSIS — Z01411 Encounter for gynecological examination (general) (routine) with abnormal findings: Secondary | ICD-10-CM | POA: Diagnosis not present

## 2018-08-10 DIAGNOSIS — N952 Postmenopausal atrophic vaginitis: Secondary | ICD-10-CM | POA: Diagnosis not present

## 2018-08-10 DIAGNOSIS — Z1239 Encounter for other screening for malignant neoplasm of breast: Secondary | ICD-10-CM

## 2018-08-10 DIAGNOSIS — Z1272 Encounter for screening for malignant neoplasm of vagina: Secondary | ICD-10-CM | POA: Diagnosis not present

## 2018-08-10 DIAGNOSIS — Z0000 Encounter for general adult medical examination without abnormal findings: Secondary | ICD-10-CM

## 2018-08-10 DIAGNOSIS — R1032 Left lower quadrant pain: Secondary | ICD-10-CM

## 2018-08-10 MED ORDER — OSPEMIFENE 60 MG PO TABS: 1.0000 | ORAL_TABLET | Freq: Every day | ORAL | 11 refills | Status: DC

## 2018-08-10 NOTE — Progress Notes (Signed)
HPI:      [name] is a 52 y.o. [id number] who LMP was in the past, she presents today for her annual examination.  The patient has no complaints today. The patient is sexually active. Herlast pap: approximate date 2016 and was normal and last mammogram: approximate date 09/2017 and was normal.  The patient does perform self breast exams.  There is no notable family history of breast or ovarian cancer in her family. The patient is not taking hormone replacement therapy. Patient denies post-menopausal vaginal bleeding.   The patient has regular exercise: yes. The patient denies current symptoms of depression.  Vag dryness and pain improved w Osphena daily.  Recent one month h/o LLQ tenderness near groin without radiation, sharp, no assoc sx's or modifiers.  Prior TAH, BSO for endometriosis years ago.  GYN Hx: Last Colonoscopy:1 year ago. Normal.   PMHx: Past Medical History:  Diagnosis Date  . Acid reflux   . Anemia   . Anxiety   . Bipolar disorder (HCC)   . Depression   . Diabetes mellitus    NIDD  . Essential tremor   . Hyperlipidemia   . Hypothyroid   . IBS (irritable bowel syndrome)   . Migraine headache    vestibular migraine  . Obesity   . Osteopenia   . Restless leg syndrome   . Sarcoidosis   . Sleep apnea   . Syncope \   Past Surgical History:  Procedure Laterality Date  . ABDOMINAL HYSTERECTOMY    . APPENDECTOMY    . BLADDER SURGERY     bladder tact  . CESAREAN SECTION    . CHOLECYSTECTOMY    . COLONOSCOPY WITH PROPOFOL N/A 04/05/2017   Procedure: COLONOSCOPY WITH PROPOFOL;  Surgeon: Christena Deem, MD;  Location: [facility] ENDOSCOPY;  Service: Endoscopy;  Laterality: N/A;  . ESOPHAGOGASTRODUODENOSCOPY (EGD) WITH PROPOFOL N/A 08/28/2016   Procedure: ESOPHAGOGASTRODUODENOSCOPY (EGD) WITH PROPOFOL;  Surgeon: Christena Deem, MD;  Location: [facility] ENDOSCOPY;  Service: Endoscopy;  Laterality: N/A;  . HERNIA REPAIR    . INGUINAL HERNIA REPAIR     rt and left    . lexiscan cardiolite    . TILT TABLE STUDY N/A 04/01/2012   Procedure: TILT TABLE STUDY;  Surgeon: Duke Salvia, MD;  Location: [facility] CATH LAB;  Service: Cardiovascular;  Laterality: N/A;   Family History  Problem Relation Age of Onset  . Cancer Father   . Hypertension Mother   . Hyperlipidemia Mother   . Alcohol abuse Brother   . Irritable bowel syndrome Sister   . Kidney cancer Neg Hx   . Kidney disease Neg Hx   . Prostate cancer Neg Hx    Social History   Tobacco Use  . Smoking status: Former Smoker    Types: Cigarettes  . Smokeless tobacco: Never Used  Substance Use Topics  . Alcohol use: No  . Drug use: No    Current Outpatient Medications:  .  atorvastatin (LIPITOR) 40 MG tablet, Take 40 mg by mouth daily. , Disp: , Rfl:  .  buPROPion (WELLBUTRIN XL) 300 MG 24 hr tablet, Take 300 mg by mouth daily., Disp: , Rfl:  .  fish oil-omega-3 fatty acids 1000 MG capsule, Take 1 g by mouth 2 (two) times daily., Disp: , Rfl:  .  furosemide (LASIX) 20 MG tablet, Take 20 mg by mouth daily., Disp: , Rfl:  .  levothyroxine (SYNTHROID, LEVOTHROID) 150 MCG tablet, Take 150 mcg by mouth daily. ,  Disp: , Rfl:  .  liothyronine (CYTOMEL) 5 MCG tablet, TAKE ONE TABLET TWICE DAILY, Disp: , Rfl:  .  Multiple Vitamin (MULITIVITAMIN WITH MINERALS) TABS, Take 1 tablet by mouth daily., Disp: , Rfl:  .  Nutritional Supplements (MELATONIN PO), Take 10 mg by mouth daily., Disp: , Rfl:  .  Ospemifene (OSPHENA) 60 MG TABS, Take 1 tablet by mouth daily., Disp: 30 tablet, Rfl: 11 .  primidone (MYSOLINE) 50 MG tablet, Take 100 mg by mouth at bedtime. , Disp: , Rfl:  .  topiramate (TOPAMAX) 200 MG tablet, Take 200 mg by mouth 2 (two) times daily. , Disp: , Rfl:  .  ALPRAZolam (XANAX) 1 MG tablet, Take 1 mg by mouth at bedtime as needed. For sleep, Disp: , Rfl:  .  Asenapine Maleate (SAPHRIS) 10 MG SUBL, Place 10 mg under the tongue., Disp: , Rfl:  .  benztropine (COGENTIN) 1 MG tablet, Take by mouth 2 (two)  times daily. , Disp: , Rfl:  .  docusate sodium (COLACE) 100 MG capsule, Take by mouth., Disp: , Rfl:  .  esomeprazole (NEXIUM) 20 MG packet, Take 20 mg by mouth daily before breakfast., Disp: , Rfl:  .  glimepiride (AMARYL) 2 MG tablet, Take 1 mg by mouth at bedtime. , Disp: , Rfl:  .  glucose blood (ONE TOUCH ULTRA TEST) test strip, TEST TWICE A DAY AS DIRECTED, Disp: , Rfl:  .  hyoscyamine (LEVSIN, ANASPAZ) 0.125 MG tablet, Take 0.125 mg by mouth every 6 (six) hours as needed for cramping., Disp: , Rfl:  .  lamoTRIgine (LAMICTAL) 25 MG tablet, Take 25 mg by mouth 3 (three) times daily. , Disp: , Rfl:  .  meclizine (ANTIVERT) 32 MG tablet, Take 1 tablet (32 mg total) by mouth 3 (three) times daily as needed. (Patient not taking: Reported on 08/10/2018), Disp: 30 tablet, Rfl: 0 .  modafinil (PROVIGIL) 200 MG tablet, Take 200 mg by mouth daily., Disp: , Rfl:  .  nadolol (CORGARD) 20 MG tablet, Take by mouth at bedtime. , Disp: , Rfl:  .  ONETOUCH DELICA LANCETS FINE MISC, USE AS DIRECTED, Disp: , Rfl:  .  predniSONE (DELTASONE) 10 MG tablet, Take 1 tablet (10 mg total) by mouth daily., Disp: 42 tablet, Rfl: 0 .  propranolol (INDERAL) 60 MG tablet, Take 60 mg by mouth 2 (two) times daily., Disp: , Rfl:  .  sitaGLIPtin-metformin (JANUMET) 50-1000 MG per tablet, TAKE ONE TABLET BY MOUTH TWICE DAILY, Disp: , Rfl:  .  Vilazodone HCl (VIIBRYD) 40 MG TABS, Take 40 mg by mouth daily., Disp: , Rfl:  Allergies: Cleocin [clindamycin hcl]  Review of Systems  Constitutional: Negative for chills, fever and malaise/fatigue.  HENT: Negative for congestion, sinus pain and sore throat.   Eyes: Negative for blurred vision and pain.  Respiratory: Negative for cough and wheezing.   Cardiovascular: Negative for chest pain and leg swelling.  Gastrointestinal: Negative for abdominal pain, constipation, diarrhea, heartburn, nausea and vomiting.  Genitourinary: Negative for dysuria, frequency, hematuria and urgency.    Musculoskeletal: Negative for back pain, joint pain, myalgias and neck pain.  Skin: Negative for itching and rash.  Neurological: Negative for dizziness, tremors and weakness.  Endo/Heme/Allergies: Does not bruise/bleed easily.  Psychiatric/Behavioral: Negative for depression. The patient is not nervous/anxious and does not have insomnia.     Objective: BP 100/60   Ht 5\' 4"  (1.626 m)   Wt 161 lb (73 kg)   BMI 27.64 kg/m  Filed Weights   08/10/18 0811  Weight: 161 lb (73 kg)   Body mass index is 27.64 kg/m. Physical Exam  Constitutional: She is oriented to person, place, and time. She appears well-developed and well-nourished. No distress.  Genitourinary: Rectum normal and vagina normal. Pelvic exam was performed with patient supine. There is no rash or lesion on the right labia. There is no rash or lesion on the left labia. Vagina exhibits no lesion. No bleeding in the vagina. Right adnexum does not display mass and does not display tenderness. Left adnexum does not display mass and does not display tenderness.  Genitourinary Comments: Absent Uterus Absent cervix Vaginal cuff well healed  HENT:  Head: Normocephalic and atraumatic. Head is without laceration.  Right Ear: Hearing normal.  Left Ear: Hearing normal.  Nose: No epistaxis.  No foreign bodies.  Mouth/Throat: Uvula is midline, oropharynx is clear and moist and mucous membranes are normal.  Eyes: Pupils are equal, round, and reactive to light.  Neck: Normal range of motion. Neck supple. No thyromegaly present.  Cardiovascular: Normal rate and regular rhythm. Exam reveals no gallop and no friction rub.  No murmur heard. Pulmonary/Chest: Effort normal and breath sounds normal. No respiratory distress. She has no wheezes. Right breast exhibits no mass, no skin change and no tenderness. Left breast exhibits no mass, no skin change and no tenderness.  Abdominal: Soft. Bowel sounds are normal. She exhibits no distension. There  is no tenderness. There is no rebound.  Musculoskeletal: Normal range of motion.  Neurological: She is alert and oriented to person, place, and time. No cranial nerve deficit.  Skin: Skin is warm and dry.  Psychiatric: She has a normal mood and affect. Judgment normal.  Vitals reviewed.   Assessment: Annual Exam 1. Annual physical exam   2. Vaginal atrophy   3. Screening for vaginal cancer   4. Screening for breast cancer   5. Screen for colon cancer    Plan:            1.  Cervical Screening-  Pap smear done today  2. Breast screening- Exam annually and mammogram scheduled  3. Colonoscopy every 10 years, Hemoccult testing after age 80  4. Labs managed by PCP  5. Counseling for hormonal therapy: none  6. Flu shot UTD  7. Vag atrophy.  Cont Osphena.  8. LLQ pain, new and intermittent.  Colon or MS etiologies discussed.  Refer as needed.     F/U  Return in about 1 year (around 08/11/2019) for Annual.  Annamarie Major, MD, [facility], [facility] 08/10/2018  8:39 AM

## 2018-08-10 NOTE — Patient Instructions (Addendum)
PAP every three years Mammogram every year    Call [phone] to schedule at [facility] Colonoscopy every 10 years    Stool testing yearly Labs yearly (with PCP)

## 2018-08-12 LAB — CYTOLOGY - PAP
DIAGNOSIS: NEGATIVE
HPV (WINDOPATH): NOT DETECTED

## 2018-08-16 ENCOUNTER — Encounter: Payer: Self-pay | Admitting: Obstetrics and Gynecology

## 2018-09-19 ENCOUNTER — Encounter: Payer: Self-pay | Admitting: *Deleted

## 2018-09-21 ENCOUNTER — Encounter
Admission: RE | Disposition: A | Discharge status: Home / Self Care | Payer: Self-pay | Source: Ambulatory Visit | Attending: Ophthalmology

## 2018-09-21 ENCOUNTER — Encounter: Payer: Self-pay | Admitting: *Deleted

## 2018-09-21 ENCOUNTER — Ambulatory Visit: Payer: Medicare Other | Admitting: Anesthesiology

## 2018-09-21 ENCOUNTER — Ambulatory Visit
Admission: RE | Admit: 2018-09-21 | Discharge: 2018-09-21 | Disposition: A | Discharge status: Home / Self Care | Payer: Medicare Other | Source: Ambulatory Visit | Attending: Ophthalmology | Admitting: Ophthalmology

## 2018-09-21 DIAGNOSIS — E785 Hyperlipidemia, unspecified: Secondary | ICD-10-CM | POA: Diagnosis not present

## 2018-09-21 DIAGNOSIS — E1136 Type 2 diabetes mellitus with diabetic cataract: Secondary | ICD-10-CM | POA: Insufficient documentation

## 2018-09-21 DIAGNOSIS — Z79890 Hormone replacement therapy (postmenopausal): Secondary | ICD-10-CM | POA: Diagnosis not present

## 2018-09-21 DIAGNOSIS — H2511 Age-related nuclear cataract, right eye: Secondary | ICD-10-CM | POA: Insufficient documentation

## 2018-09-21 DIAGNOSIS — N183 Chronic kidney disease, stage 3 (moderate): Secondary | ICD-10-CM | POA: Insufficient documentation

## 2018-09-21 DIAGNOSIS — G43909 Migraine, unspecified, not intractable, without status migrainosus: Secondary | ICD-10-CM | POA: Diagnosis not present

## 2018-09-21 DIAGNOSIS — E1122 Type 2 diabetes mellitus with diabetic chronic kidney disease: Secondary | ICD-10-CM | POA: Insufficient documentation

## 2018-09-21 DIAGNOSIS — Z881 Allergy status to other antibiotic agents status: Secondary | ICD-10-CM | POA: Insufficient documentation

## 2018-09-21 DIAGNOSIS — K219 Gastro-esophageal reflux disease without esophagitis: Secondary | ICD-10-CM | POA: Insufficient documentation

## 2018-09-21 DIAGNOSIS — F329 Major depressive disorder, single episode, unspecified: Secondary | ICD-10-CM | POA: Insufficient documentation

## 2018-09-21 DIAGNOSIS — Z87891 Personal history of nicotine dependence: Secondary | ICD-10-CM | POA: Insufficient documentation

## 2018-09-21 DIAGNOSIS — D869 Sarcoidosis, unspecified: Secondary | ICD-10-CM | POA: Insufficient documentation

## 2018-09-21 DIAGNOSIS — E039 Hypothyroidism, unspecified: Secondary | ICD-10-CM | POA: Insufficient documentation

## 2018-09-21 DIAGNOSIS — F419 Anxiety disorder, unspecified: Secondary | ICD-10-CM | POA: Diagnosis not present

## 2018-09-21 DIAGNOSIS — Z79899 Other long term (current) drug therapy: Secondary | ICD-10-CM | POA: Insufficient documentation

## 2018-09-21 DIAGNOSIS — K589 Irritable bowel syndrome without diarrhea: Secondary | ICD-10-CM | POA: Insufficient documentation

## 2018-09-21 DIAGNOSIS — G4730 Sleep apnea, unspecified: Secondary | ICD-10-CM | POA: Insufficient documentation

## 2018-09-21 HISTORY — DX: Chronic kidney disease, unspecified: N18.9

## 2018-09-21 HISTORY — PX: CATARACT EXTRACTION W/PHACO: SHX586

## 2018-09-21 LAB — FECAL OCCULT BLOOD, IMMUNOCHEMICAL: Fecal Occult Bld: NEGATIVE

## 2018-09-21 SURGERY — PHACOEMULSIFICATION, CATARACT, WITH IOL INSERTION
Anesthesia: Monitor Anesthesia Care | Site: Eye | Laterality: Right

## 2018-09-21 MED ORDER — MIDAZOLAM HCL 2 MG/2ML IJ SOLN
INTRAMUSCULAR | Status: DC | PRN
  Administered 2018-09-21: 1.5 mg via INTRAVENOUS
  Administered 2018-09-21: 0.5 mg via INTRAVENOUS

## 2018-09-21 MED ORDER — FENTANYL CITRATE (PF) 100 MCG/2ML IJ SOLN
INTRAMUSCULAR | Status: AC
  Filled 2018-09-21: qty 2

## 2018-09-21 MED ORDER — ONDANSETRON HCL 4 MG/2ML IJ SOLN
INTRAMUSCULAR | Status: AC
  Filled 2018-09-21: qty 2

## 2018-09-21 MED ORDER — KETOROLAC TROMETHAMINE 30 MG/ML IJ SOLN
INTRAMUSCULAR | Status: DC | PRN
  Administered 2018-09-21: 30 mg via INTRAVENOUS

## 2018-09-21 MED ORDER — MIDAZOLAM HCL 2 MG/2ML IJ SOLN
INTRAMUSCULAR | Status: AC
  Filled 2018-09-21: qty 2

## 2018-09-21 MED ORDER — MOXIFLOXACIN HCL 0.5 % OP SOLN: 1.0000 [drp] | OPHTHALMIC | Status: DC | PRN

## 2018-09-21 MED ORDER — ARMC OPHTHALMIC DILATING DROPS
1.0000 "application " | OPHTHALMIC | Status: AC
  Administered 2018-09-21 (×3): 1 via OPHTHALMIC

## 2018-09-21 MED ORDER — TETRACAINE HCL 0.5 % OP SOLN
OPHTHALMIC | Status: AC
  Filled 2018-09-21: qty 4

## 2018-09-21 MED ORDER — EPINEPHRINE PF 1 MG/ML IJ SOLN
INTRAOCULAR | Status: DC | PRN
  Administered 2018-09-21: 1 mL via OPHTHALMIC

## 2018-09-21 MED ORDER — TETRACAINE HCL 0.5 % OP SOLN
1.0000 [drp] | OPHTHALMIC | Status: AC | PRN
  Administered 2018-09-21 (×3): 1 [drp] via OPHTHALMIC

## 2018-09-21 MED ORDER — SODIUM HYALURONATE 10 MG/ML IO SOLN
INTRAOCULAR | Status: DC | PRN
  Administered 2018-09-21: .55 mL via INTRAOCULAR

## 2018-09-21 MED ORDER — SODIUM CHLORIDE 0.9 % IV SOLN
INTRAVENOUS | Status: DC
  Administered 2018-09-21 (×2): via INTRAVENOUS

## 2018-09-21 MED ORDER — FENTANYL CITRATE (PF) 100 MCG/2ML IJ SOLN
INTRAMUSCULAR | Status: DC | PRN
  Administered 2018-09-21: 50 ug via INTRAVENOUS
  Administered 2018-09-21 (×2): 25 ug via INTRAVENOUS

## 2018-09-21 MED ORDER — POVIDONE-IODINE 5 % OP SOLN
OPHTHALMIC | Status: DC | PRN
  Administered 2018-09-21: 1 via OPHTHALMIC

## 2018-09-21 MED ORDER — SODIUM HYALURONATE 23 MG/ML IO SOLN
INTRAOCULAR | Status: DC | PRN
  Administered 2018-09-21: .6 mL via INTRAOCULAR

## 2018-09-21 MED ORDER — MOXIFLOXACIN HCL 0.5 % OP SOLN
OPHTHALMIC | Status: DC | PRN
  Administered 2018-09-21: .2 mL via OPHTHALMIC

## 2018-09-21 MED ORDER — LIDOCAINE HCL (PF) 4 % IJ SOLN
INTRAOCULAR | Status: DC | PRN
  Administered 2018-09-21: 2 mL via OPHTHALMIC

## 2018-09-21 MED ORDER — ARMC OPHTHALMIC DILATING DROPS
OPHTHALMIC | Status: AC
  Filled 2018-09-21: qty 0.5

## 2018-09-21 MED ORDER — MOXIFLOXACIN HCL 0.5 % OP SOLN
OPHTHALMIC | Status: AC
  Filled 2018-09-21: qty 3

## 2018-09-21 SURGICAL SUPPLY — 16 items
DISSECTOR HYDRO NUCLEUS 50X22 (MISCELLANEOUS) ×3 IMPLANT
GLOVE BIO SURGEON STRL SZ8 (GLOVE) ×3 IMPLANT
GLOVE BIOGEL M 6.5 STRL (GLOVE) ×3 IMPLANT
GLOVE SURG LX 7.5 STRW (GLOVE) ×2
GLOVE SURG LX STRL 7.5 STRW (GLOVE) ×1 IMPLANT
GOWN STRL REUS W/ TWL LRG LVL3 (GOWN DISPOSABLE) ×2 IMPLANT
GOWN STRL REUS W/TWL LRG LVL3 (GOWN DISPOSABLE) ×6
LABEL CATARACT MEDS ST (LABEL) ×3 IMPLANT
LENS IOL TECNIS ITEC 14.0 (Intraocular Lens) ×2 IMPLANT
PACK CATARACT (MISCELLANEOUS) ×3 IMPLANT
PACK CATARACT KING (MISCELLANEOUS) ×3 IMPLANT
PACK EYE AFTER SURG (MISCELLANEOUS) ×3 IMPLANT
SOL BSS BAG (MISCELLANEOUS) ×3
SOLUTION BSS BAG (MISCELLANEOUS) ×1 IMPLANT
WATER STERILE IRR 250ML POUR (IV SOLUTION) ×3 IMPLANT
WIPE NON LINTING 3.25X3.25 (MISCELLANEOUS) ×3 IMPLANT

## 2018-09-21 NOTE — Op Note (Signed)
OPERATIVE NOTE  Jennifer Bright [id number] 09/21/2018   PREOPERATIVE DIAGNOSIS:  Nuclear sclerotic cataract right eye.  H25.11   POSTOPERATIVE DIAGNOSIS:    Nuclear sclerotic cataract right eye.     PROCEDURE:  Phacoemusification with posterior chamber intraocular lens placement of the right eye   LENS:   Implant Name Type Inv. Item Serial No. Manufacturer Lot No. LRB No. Used  LENS IOL DIOP 14.0 - [id number] Intraocular Lens LENS IOL DIOP 14.0 [id number] AMO  Right 1       PCB00 +14.0   ULTRASOUND TIME: 0 minutes 20 seconds.  CDE 0.82   SURGEON:  Willey BladeBradley King, MD, MPH  ANESTHESIOLOGIST: No anesthesia staff entered.   ANESTHESIA:  Topical with tetracaine drops augmented with 1% preservative-free intracameral lidocaine.  ESTIMATED BLOOD LOSS: less than 1 mL.   COMPLICATIONS:  None.   DESCRIPTION OF PROCEDURE:  The patient was identified in the holding room and transported to the operating room and placed in the supine position under the operating microscope.  The right eye was identified as the operative eye and it was prepped and draped in the usual sterile ophthalmic fashion.   A 1.0 millimeter clear-corneal paracentesis was made at the 10:30 position. 0.5 ml of preservative-free 1% lidocaine with epinephrine was injected into the anterior chamber.  The anterior chamber was filled with Healon 5 viscoelastic.  A 2.4 millimeter keratome was used to make a near-clear corneal incision at the 8:00 position.  A curvilinear capsulorrhexis was made with a cystotome and capsulorrhexis forceps.  Balanced salt solution was used to hydrodissect and hydrodelineate the nucleus.   Phacoemulsification was then used in stop and chop fashion to remove the lens nucleus and epinucleus.  The remaining cortex was then removed using the irrigation and aspiration handpiece. Healon was then placed into the capsular bag to distend it for lens placement.  A lens was then injected into the capsular  bag.  The remaining viscoelastic was aspirated.   Wounds were hydrated with balanced salt solution.  The anterior chamber was inflated to a physiologic pressure with balanced salt solution.    Intracameral vigamox 0.1 mL undiluted was injected into the eye and a drop placed onto the ocular surface.  No wound leaks were noted.  The patient was taken to the recovery room in stable condition without complications of anesthesia or surgery.  The patient complained of significant pain and discomfort throughout the procedure, which was rouitine without any significant maneuvers out of ordinary.  She had significant difficulty holding still.    Willey BladeBradley King 09/21/2018, 8:44 AM

## 2018-09-21 NOTE — Anesthesia Post-op Follow-up Note (Signed)
Anesthesia QCDR form completed.        

## 2018-09-21 NOTE — H&P (Signed)
The History and Physical notes are on paper, have been signed, and are to be scanned.   I have examined the patient and there are no changes to the H&P.   Jennifer BladeBradley Dorion Bright 09/21/2018 8:10 AM \

## 2018-09-21 NOTE — Discharge Instructions (Signed)
Eye Surgery Discharge Instructions ° °Expect mild scratchy sensation or mild soreness. °DO NOT RUB YOUR EYE! ° °The day of surgery: °• Minimal physical activity, but bed rest is not required °• No reading, computer work, or close hand work °• No bending, lifting, or straining. °• May watch TV ° °For 24 hours: °• No driving, legal decisions, or alcoholic beverages °• Safety precautions °• Eat anything you prefer: It is better to start with liquids, then soup then solid foods. °• _____ Eye patch should be worn until postoperative exam tomorrow. °• ____ Solar shield eyeglasses should be worn for comfort in the sunlight/patch while sleeping ° °Resume all regular medications including aspirin or Coumadin if these were discontinued prior to surgery. °You may shower, bathe, shave, or wash your hair. °Tylenol may be taken for mild discomfort. ° °Call your doctor if you experience significant pain, nausea, or vomiting, fever > 101 or other signs of infection. [phone] or [phone] °Specific instructions: ° ° Eye Surgery Discharge Instructions ° °Expect mild scratchy sensation or mild soreness. °DO NOT RUB YOUR EYE! ° °The day of surgery: °• Minimal physical activity, but bed rest is not required °• No reading, computer work, or close hand work °• No bending, lifting, or straining. °• May watch TV ° °For 24 hours: °• No driving, legal decisions, or alcoholic beverages °• Safety precautions °• Eat anything you prefer: It is better to start with liquids, then soup then solid foods. °• _____ Eye patch should be worn until postoperative exam tomorrow. °• ____ Solar shield eyeglasses should be worn for comfort in the sunlight/patch while sleeping ° °Resume all regular medications including aspirin or Coumadin if these were discontinued prior to surgery. °You may shower, bathe, shave, or wash your hair. °Tylenol may be taken for mild discomfort. ° °Call your doctor if you experience significant pain, nausea, or vomiting, fever  > 101 or other signs of infection. [phone] or [phone] °Specific instructions: ° ° °

## 2018-09-21 NOTE — Anesthesia Postprocedure Evaluation (Signed)
Anesthesia Post Note  Patient: Jennifer Bright  Procedure(s) Performed: CATARACT EXTRACTION PHACO AND INTRAOCULAR LENS PLACEMENT ([place]) (Right Eye)  Patient location during evaluation: Short Stay Anesthesia Type: MAC Level of consciousness: awake and awake and alert Pain management: pain level controlled Vital Signs Assessment: post-procedure vital signs reviewed and stable Respiratory status: spontaneous breathing Cardiovascular status: blood pressure returned to baseline and stable Anesthetic complications: no     Last Vitals:  Vitals:   09/21/18 0701 09/21/18 0847  BP: 100/74 107/67  Pulse: 78 72  Resp: 14 (!) 6  Temp: (!) 36.4 C (!) 35.7 C  SpO2: 98% 95%    Last Pain:  Vitals:   09/21/18 0847  TempSrc: Temporal  PainSc: 3                  [place]ner [place]

## 2018-09-21 NOTE — Anesthesia Procedure Notes (Signed)
Procedure Name: MAC Date/Time: 09/21/2018 8:30 AM Performed by: Allean Found, CRNA Pre-anesthesia Checklist: Patient identified, Emergency Drugs available, Suction available, Patient being monitored and Timeout performed Oxygen Delivery Method: Nasal cannula Placement Confirmation: positive ETCO2

## 2018-09-21 NOTE — Anesthesia Preprocedure Evaluation (Signed)
Anesthesia Evaluation  Patient identified by MRN, date of birth, ID band Patient awake    Reviewed: Allergy & Precautions, NPO status , Patient's Chart, lab work & pertinent test results  History of Anesthesia Complications Negative for: history of anesthetic complications  Airway Mallampati: II  TM Distance: >3 FB Neck ROM: Full    Dental no notable dental hx.    Pulmonary sleep apnea , neg COPD, former smoker,    breath sounds clear to auscultation- rhonchi (-) wheezing      Cardiovascular hypertension, (-) CAD, (-) Past MI, (-) Cardiac Stents and (-) CABG  Rhythm:Regular Rate:Normal - Systolic murmurs and - Diastolic murmurs    Neuro/Psych  Headaches, PSYCHIATRIC DISORDERS Anxiety Depression Bipolar Disorder    GI/Hepatic Neg liver ROS, GERD  ,  Endo/Other  diabetesHypothyroidism   Renal/GU Renal InsufficiencyRenal disease     Musculoskeletal negative musculoskeletal ROS (+)   Abdominal (+) - obese,   Peds  Hematology  (+) anemia ,   Anesthesia Other Findings Past Medical History: No date: Acid reflux No date: Anemia No date: Anxiety No date: Bipolar disorder ([place]) No date: Chronic kidney disease     Comment:  STAGE 3 No date: Depression No date: Diabetes mellitus     Comment:  NIDD No date: Essential tremor No date: Family history of ovarian cancer     Comment:  Pt doesn't meet Medicare genetic testing guidelines No date: Hyperlipidemia No date: Hypothyroid No date: IBS (irritable bowel syndrome) No date: Migraine headache     Comment:  vestibular migraine No date: Obesity No date: Osteopenia No date: Restless leg syndrome No date: Sarcoidosis No date: Sleep apnea \: Syncope   Reproductive/Obstetrics                             Anesthesia Physical Anesthesia Plan  ASA: III  Anesthesia Plan: MAC   Post-op Pain Management:    Induction: Intravenous  PONV Risk  Score and Plan: 2 and Midazolam  Airway Management Planned: Natural Airway  Additional Equipment:   Intra-op Plan:   Post-operative Plan:   Informed Consent: I have reviewed the patients History and Physical, chart, labs and discussed the procedure including the risks, benefits and alternatives for the proposed anesthesia with the patient or authorized representative who has indicated his/her understanding and acceptance.     Plan Discussed with: CRNA and Anesthesiologist  Anesthesia Plan Comments:         Anesthesia Quick Evaluation

## 2018-09-21 NOTE — Transfer of Care (Signed)
Immediate Anesthesia Transfer of Care Note  Patient: Jennifer Bright  Procedure(s) Performed: CATARACT EXTRACTION PHACO AND INTRAOCULAR LENS PLACEMENT ([facility]) (Right Eye)  Patient Location: PACU  Anesthesia Type:MAC  Level of Consciousness: awake  Airway & Oxygen Therapy: Patient Spontanous Breathing  Post-op Assessment: Report given to RN and Post -op Vital signs reviewed and stable  Post vital signs: Reviewed and stable  Last Vitals:  Vitals Value Taken Time  BP 107/67 09/21/2018  8:47 AM  Temp 35.7 C 09/21/2018  8:47 AM  Pulse 72 09/21/2018  8:47 AM  Resp 6 09/21/2018  8:47 AM  SpO2 95 % 09/21/2018  8:47 AM    Last Pain:  Vitals:   09/21/18 0847  TempSrc: Temporal  PainSc: 3          Complications: No apparent anesthesia complications

## 2018-09-22 ENCOUNTER — Encounter: Payer: Self-pay | Admitting: Ophthalmology

## 2018-10-05 ENCOUNTER — Encounter: Payer: Self-pay | Admitting: *Deleted

## 2018-10-05 ENCOUNTER — Other Ambulatory Visit: Payer: Self-pay

## 2018-10-10 NOTE — Discharge Instructions (Signed)

## 2018-10-17 ENCOUNTER — Encounter
Admission: RE | Disposition: A | Discharge status: Home / Self Care | Payer: Self-pay | Source: Home / Self Care | Attending: Ophthalmology

## 2018-10-17 ENCOUNTER — Ambulatory Visit
Admission: RE | Admit: 2018-10-17 | Discharge: 2018-10-17 | Disposition: A | Discharge status: Home / Self Care | Payer: Medicare Other | Attending: Ophthalmology | Admitting: Ophthalmology

## 2018-10-17 ENCOUNTER — Ambulatory Visit: Payer: Medicare Other | Admitting: Anesthesiology

## 2018-10-17 DIAGNOSIS — E119 Type 2 diabetes mellitus without complications: Secondary | ICD-10-CM | POA: Insufficient documentation

## 2018-10-17 DIAGNOSIS — Z87891 Personal history of nicotine dependence: Secondary | ICD-10-CM | POA: Diagnosis not present

## 2018-10-17 DIAGNOSIS — F329 Major depressive disorder, single episode, unspecified: Secondary | ICD-10-CM | POA: Diagnosis not present

## 2018-10-17 DIAGNOSIS — N183 Chronic kidney disease, stage 3 (moderate): Secondary | ICD-10-CM | POA: Insufficient documentation

## 2018-10-17 DIAGNOSIS — D869 Sarcoidosis, unspecified: Secondary | ICD-10-CM | POA: Insufficient documentation

## 2018-10-17 DIAGNOSIS — E039 Hypothyroidism, unspecified: Secondary | ICD-10-CM | POA: Diagnosis not present

## 2018-10-17 DIAGNOSIS — Z79899 Other long term (current) drug therapy: Secondary | ICD-10-CM | POA: Diagnosis not present

## 2018-10-17 DIAGNOSIS — H2512 Age-related nuclear cataract, left eye: Secondary | ICD-10-CM | POA: Insufficient documentation

## 2018-10-17 HISTORY — PX: CATARACT EXTRACTION W/PHACO: SHX586

## 2018-10-17 SURGERY — PHACOEMULSIFICATION, CATARACT, WITH IOL INSERTION
Anesthesia: Monitor Anesthesia Care | Site: Eye | Laterality: Left

## 2018-10-17 MED ORDER — MIDAZOLAM HCL 2 MG/2ML IJ SOLN
INTRAMUSCULAR | Status: DC | PRN
  Administered 2018-10-17: 2 mg via INTRAVENOUS

## 2018-10-17 MED ORDER — EPINEPHRINE PF 1 MG/ML IJ SOLN
INTRAOCULAR | Status: DC | PRN
  Administered 2018-10-17: 118 mL via OPHTHALMIC

## 2018-10-17 MED ORDER — ACETAMINOPHEN 325 MG PO TABS
650.0000 mg | ORAL_TABLET | Freq: Once | ORAL | Status: AC
  Administered 2018-10-17: 650 mg via ORAL

## 2018-10-17 MED ORDER — FENTANYL CITRATE (PF) 100 MCG/2ML IJ SOLN
INTRAMUSCULAR | Status: DC | PRN
  Administered 2018-10-17 (×3): 50 ug via INTRAVENOUS

## 2018-10-17 MED ORDER — SODIUM HYALURONATE 10 MG/ML IO SOLN
INTRAOCULAR | Status: DC | PRN
  Administered 2018-10-17 (×2): 0.55 mL via INTRAOCULAR

## 2018-10-17 MED ORDER — ARMC OPHTHALMIC DILATING DROPS
1.0000 "application " | OPHTHALMIC | Status: DC | PRN
  Administered 2018-10-17 (×3): 1 via OPHTHALMIC

## 2018-10-17 MED ORDER — SODIUM HYALURONATE 23 MG/ML IO SOLN
INTRAOCULAR | Status: DC | PRN
  Administered 2018-10-17: 0.6 mL via INTRAOCULAR

## 2018-10-17 MED ORDER — OXYCODONE HCL 5 MG PO TABS: 5.0000 mg | ORAL_TABLET | Freq: Once | ORAL | Status: DC | PRN

## 2018-10-17 MED ORDER — MOXIFLOXACIN HCL 0.5 % OP SOLN
OPHTHALMIC | Status: DC | PRN
  Administered 2018-10-17: 0.2 mL via OPHTHALMIC

## 2018-10-17 MED ORDER — KETOROLAC TROMETHAMINE 30 MG/ML IJ SOLN
INTRAMUSCULAR | Status: DC | PRN
  Administered 2018-10-17: 30 mg via INTRAVENOUS

## 2018-10-17 MED ORDER — LACTATED RINGERS IV SOLN: INTRAVENOUS | Status: DC

## 2018-10-17 MED ORDER — TETRACAINE HCL 0.5 % OP SOLN
1.0000 [drp] | OPHTHALMIC | Status: DC | PRN
  Administered 2018-10-17 (×2): 1 [drp] via OPHTHALMIC

## 2018-10-17 MED ORDER — LIDOCAINE HCL (PF) 2 % IJ SOLN
INTRAOCULAR | Status: DC | PRN
  Administered 2018-10-17: 1 mL via INTRAOCULAR

## 2018-10-17 MED ORDER — OXYCODONE HCL 5 MG/5ML PO SOLN: 5.0000 mg | Freq: Once | ORAL | Status: DC | PRN

## 2018-10-17 SURGICAL SUPPLY — 19 items
CANNULA ANT/CHMB 27G (MISCELLANEOUS) ×2 IMPLANT
CANNULA ANT/CHMB 27GA (MISCELLANEOUS) ×6 IMPLANT
DISSECTOR HYDRO NUCLEUS 50X22 (MISCELLANEOUS) ×3 IMPLANT
GLOVE SURG LX 7.5 STRW (GLOVE) ×2
GLOVE SURG LX STRL 7.5 STRW (GLOVE) ×1 IMPLANT
GLOVE SURG SYN 8.5  E (GLOVE) ×2
GLOVE SURG SYN 8.5 E (GLOVE) ×1 IMPLANT
GLOVE SURG SYN 8.5 PF PI (GLOVE) ×1 IMPLANT
GOWN STRL REUS W/ TWL LRG LVL3 (GOWN DISPOSABLE) ×2 IMPLANT
GOWN STRL REUS W/TWL LRG LVL3 (GOWN DISPOSABLE) ×6
LENS IOL TECNIS ITEC 15.5 (Intraocular Lens) ×2 IMPLANT
MARKER SKIN DUAL TIP RULER LAB (MISCELLANEOUS) ×3 IMPLANT
PACK DR. KING ARMS (PACKS) ×3 IMPLANT
PACK EYE AFTER SURG (MISCELLANEOUS) ×3 IMPLANT
PACK OPTHALMIC (MISCELLANEOUS) ×3 IMPLANT
SYR 3ML LL SCALE MARK (SYRINGE) ×3 IMPLANT
SYR TB 1ML LUER SLIP (SYRINGE) ×3 IMPLANT
WATER STERILE IRR 500ML POUR (IV SOLUTION) ×3 IMPLANT
WIPE NON LINTING 3.25X3.25 (MISCELLANEOUS) ×3 IMPLANT

## 2018-10-17 NOTE — H&P (Signed)

## 2018-10-17 NOTE — Op Note (Signed)
OPERATIVE NOTE  Jennifer Bright [id number] 10/17/2018   PREOPERATIVE DIAGNOSIS:  Nuclear sclerotic cataract left eye.  H25.12   POSTOPERATIVE DIAGNOSIS:    Nuclear sclerotic cataract left eye.     PROCEDURE:  Phacoemusification with posterior chamber intraocular lens placement of the left eye   LENS:   Implant Name Type Inv. Item Serial No. Manufacturer Lot No. LRB No. Used  LENS IOL DIOP 15.5 - [id number] Intraocular Lens LENS IOL DIOP 15.5 [id number]  Left 1       PCB00 +15.5   ULTRASOUND TIME: 0 minutes 45 seconds.  CDE 4.73   SURGEON:  Jennifer BladeBradley Natallia Stellmach, MD, MPH   ANESTHESIA:  Topical with tetracaine drops augmented with 1% preservative-free intracameral lidocaine.  ESTIMATED BLOOD LOSS: <1 mL   COMPLICATIONS:  None.   DESCRIPTION OF PROCEDURE:  The patient was identified in the holding room and transported to the operating room and placed in the supine position under the operating microscope.  The left eye was identified as the operative eye and it was prepped and draped in the usual sterile ophthalmic fashion.   A 1.0 millimeter clear-corneal paracentesis was made at the 5:00 position. 0.5 ml of preservative-free 1% lidocaine with epinephrine was injected into the anterior chamber.  The anterior chamber was filled with Healon 5 viscoelastic.  A 2.4 millimeter keratome was used to make a near-clear corneal incision at the 2:00 position.  A curvilinear capsulorrhexis was made with a cystotome and capsulorrhexis forceps.  Balanced salt solution was used to hydrodissect and hydrodelineate the nucleus.   Phacoemulsification was then used in stop and chop fashion to remove the lens nucleus and epinucleus.  The remaining cortex was then removed using the irrigation and aspiration handpiece. Healon was then placed into the capsular bag to distend it for lens placement.  A lens was then injected into the capsular bag.  The remaining viscoelastic was aspirated.   Wounds were  hydrated with balanced salt solution.  The anterior chamber was inflated to a physiologic pressure with balanced salt solution.  Intracameral vigamox 0.1 mL undiltued was injected into the eye and a drop placed onto the ocular surface.  No wound leaks were noted.  The patient was taken to the recovery room in stable condition without complications of anesthesia or surgery.  The patient was agitated, anxious and complained of severe nose itching and some pain at various points during the surgery despite significant doses of anesthesia.  Even the removal of the drapes was very painful.    Jennifer Bright 10/17/2018, 10:25 AM

## 2018-10-17 NOTE — Anesthesia Postprocedure Evaluation (Signed)
Anesthesia Post Note  Patient: Jennifer Bright  Procedure(s) Performed: CATARACT EXTRACTION PHACO AND INTRAOCULAR LENS PLACEMENT ([place])  LEFT (Left Eye)  Patient location during evaluation: PACU Anesthesia Type: MAC Level of consciousness: awake and alert Pain management: pain level controlled Vital Signs Assessment: post-procedure vital signs reviewed and stable Respiratory status: spontaneous breathing, nonlabored ventilation, respiratory function stable and patient connected to nasal cannula oxygen Cardiovascular status: stable and blood pressure returned to baseline Postop Assessment: no apparent nausea or vomiting Anesthetic complications: no    Poet Hineman

## 2018-10-17 NOTE — Transfer of Care (Signed)
Immediate Anesthesia Transfer of Care Note  Patient: Jennifer Bright  Procedure(s) Performed: CATARACT EXTRACTION PHACO AND INTRAOCULAR LENS PLACEMENT ([facility])  LEFT (Left Eye)  Patient Location: PACU  Anesthesia Type: MAC  Level of Consciousness: awake, alert  and patient cooperative  Airway and Oxygen Therapy: Patient Spontanous Breathing and Patient connected to supplemental oxygen  Post-op Assessment: Post-op Vital signs reviewed, Patient's Cardiovascular Status Stable, Respiratory Function Stable, Patent Airway and No signs of Nausea or vomiting  Post-op Vital Signs: Reviewed and stable  Complications: No apparent anesthesia complications

## 2018-10-17 NOTE — Anesthesia Preprocedure Evaluation (Signed)
Anesthesia Evaluation  Patient identified by MRN, date of birth, ID band  Reviewed: NPO status   History of Anesthesia Complications Negative for: history of anesthetic complications  Airway Mallampati: II  TM Distance: >3 FB Neck ROM: full    Dental  (+) Upper Dentures, Lower Dentures   Pulmonary former smoker,  Sarcoidosis   Pulmonary exam normal        Cardiovascular Exercise Tolerance: Good negative cardio ROS Normal cardiovascular exam  Chronic sycope (last 5-15 mins) .. followed by neurologist.   Neuro/Psych  Headaches, Seizures -,  Anxiety Bipolar Disorder  Primary narcolepsy with cataplexy ;  Tremors;    GI/Hepatic Neg liver ROS, GERD  Controlled,  Endo/Other  Hypothyroidism   Renal/GU CRF  negative genitourinary   Musculoskeletal   Abdominal   Peds  Hematology negative hematology ROS (+)   Anesthesia Other Findings stress: 02/2018: LVEF= 61% FINDINGS: Regional wall motion: reveals normal myocardial thickening and wall  motion. The overall quality of the study is good.  Artifacts noted: no Left ventricular cavity: normal.  Perfusion Analysis: SPECT images demonstrate homogeneous tracer  distribution throughout the myocardium. ;  ekg: 02/2018: Normal sinus rhythm Left axis deviation;  neuro stable: 07/2018: dr. Joretta Bachelor: Patient presents for a recheck of Headache. The pain severity is severe. The location of the the headaches are temporal, eyes and back of head. The headaches last minutes. The pain is described as throbbing. The symptoms have been associated with sensitivity to light, worse with movement/activities and dizziness, while the symptoms have not been associated with nausea, vomiting, sensitivity to noise or odor sensitivity. The patient describes the following visual changes: blurred vision, flashing lights and double vision. Headache triggers include fatigue, sleep deprivation, hurrying,  worrying and emotional stress.   Most of her headaches involve a loss of consciousness. She then will have an occipital headache. In the past very nice response to Topamax but will develop tachyphylaxis and dose has continually been increased.   Headaches have worsened. The patient began Aimovig in January. She reports following injecting Aimovig. She experienced body aches. She has not continued Aimovig. She visited a rheumatologist who informed her of Vitamin B12 deficiency. She is currently receiving Vitamin B12 shots. Pain is improving. She feels better overall.  She continues to have syncopal episodes Plan 1. Chronic migraine  2. Primary narcolepsy with cataplexy  3. Polypharmacy  4. Syncope, unspecified syncope type  5. Bipolar 1 disorder, mixed, severe (*)   Improved. Unclear what has helped she has had many changes recently with psychiatry. No changes in medications today. Narcolepsy. She has not been taking nuvigil recently but would like to restart. Follow up in about 3 months (around 10/25/2018).   last cataract: 09/21/2018;    Reproductive/Obstetrics                             Anesthesia Physical Anesthesia Plan  ASA: III  Anesthesia Plan: MAC   Post-op Pain Management:    Induction:   PONV Risk Score and Plan:   Airway Management Planned:   Additional Equipment:   Intra-op Plan:   Post-operative Plan:   Informed Consent: I have reviewed the patients History and Physical, chart, labs and discussed the procedure including the risks, benefits and alternatives for the proposed anesthesia with the patient or authorized representative who has indicated his/her understanding and acceptance.     Plan Discussed with: CRNA  Anesthesia Plan Comments:  Anesthesia Quick Evaluation  

## 2018-10-17 NOTE — Anesthesia Procedure Notes (Signed)
Procedure Name: [place] Performed by: Cameron Ali, CRNA Pre-anesthesia Checklist: Patient identified, Emergency Drugs available, Suction available, Timeout performed and Patient being monitored Patient Re-evaluated:Patient Re-evaluated prior to induction Oxygen Delivery Method: Nasal cannula Placement Confirmation: positive ETCO2

## 2018-10-18 ENCOUNTER — Encounter: Payer: Self-pay | Admitting: Ophthalmology

## 2018-10-24 ENCOUNTER — Emergency Department
Admission: EM | Admit: 2018-10-24 | Discharge: 2018-10-24 | Discharge status: Against Medical Advice | Payer: Medicare Other

## 2018-10-24 NOTE — ED Notes (Signed)
No answer when called several times from lobby 

## 2018-10-24 NOTE — ED Notes (Signed)
Called for triage with no answer

## 2018-10-24 NOTE — ED Notes (Signed)
Pt called for triage with no answer 

## 2018-10-28 ENCOUNTER — Telehealth: Payer: Self-pay

## 2018-10-28 ENCOUNTER — Other Ambulatory Visit: Payer: Self-pay | Admitting: Obstetrics & Gynecology

## 2018-10-28 DIAGNOSIS — R928 Other abnormal and inconclusive findings on diagnostic imaging of breast: Secondary | ICD-10-CM

## 2018-10-28 NOTE — Telephone Encounter (Signed)
Pt calling for mammogram results from December 2019.  She hasn't received a letter or anything.  [phone]  Left detailed msg for pt to call facility where she had it done as we haven't received results.  After hanging up I looked in Care Everywhere and found the results which are abnormal.  It says they will contact the pt for additional tests and a letter will be sent.

## 2018-10-28 NOTE — Telephone Encounter (Signed)
I spoke to the patient's husband, Brynda Greathouse (on [place]) and relayed info from Dr Tiburcio Pea. Also, I let Brynda Greathouse know the orders would be sent to [facility], and they would contact the patient to schedule an appointment in either [facility] or [place] since it's diagnostic.  Orders faxed to [facility], and I contacted [facility] @ [facility] who said they had attempted to reach the patient 3x at the home#. Mobile# was given and Anne Hahn was going to call.

## 2018-10-28 NOTE — Telephone Encounter (Signed)
Let her know we have not seen results as MMG was at [facility] facility, not [facility]).  However, with some digging was able to see results and noted need for follow up imaging of the right breast.  The results of your recent mammogram reveals an area that needs further magnification views to determine if there is any concern or if it is just a false alarm. There is no suggestion of cancer, just a need for further images and evaluation by the radiologist. They should be contacting you, if not already, to schedule these additional mammogram pictures. I know this seems worrisome, yet usually additional xrays clear up any suspicion for breast cancer.  I will have [name] check on having this done back at [facility] if desired there, since that is where you have been going in past (orders placed).

## 2018-11-15 ENCOUNTER — Other Ambulatory Visit: Payer: Self-pay | Admitting: Internal Medicine

## 2018-11-15 DIAGNOSIS — R1010 Upper abdominal pain, unspecified: Secondary | ICD-10-CM

## 2018-11-25 ENCOUNTER — Ambulatory Visit
Admission: RE | Admit: 2018-11-25 | Discharge: 2018-11-25 | Disposition: A | Discharge status: Home / Self Care | Payer: Medicare Other | Source: Ambulatory Visit | Attending: Internal Medicine | Admitting: Internal Medicine

## 2018-11-25 DIAGNOSIS — R1010 Upper abdominal pain, unspecified: Secondary | ICD-10-CM | POA: Insufficient documentation

## 2019-01-02 ENCOUNTER — Telehealth (HOSPITAL_COMMUNITY): Payer: Self-pay | Admitting: Psychiatry

## 2019-01-02 NOTE — Telephone Encounter (Signed)
D:  Dr. Evelene Croon referred pt to MH-IOP.  A:  Called and oriented pt.  Pt will start MH-IOP on 01-16-19.  R:  Pt receptive.

## 2019-01-09 ENCOUNTER — Telehealth (HOSPITAL_COMMUNITY): Payer: Self-pay | Admitting: Professional

## 2019-02-18 ENCOUNTER — Other Ambulatory Visit: Payer: Self-pay

## 2019-02-18 ENCOUNTER — Emergency Department: Payer: Medicare Other

## 2019-02-18 ENCOUNTER — Encounter: Payer: Self-pay | Admitting: Emergency Medicine

## 2019-02-18 ENCOUNTER — Emergency Department
Admission: EM | Admit: 2019-02-18 | Discharge: 2019-02-18 | Disposition: A | Discharge status: Home / Self Care | Payer: Medicare Other | Attending: Emergency Medicine | Admitting: Emergency Medicine

## 2019-02-18 DIAGNOSIS — Z87891 Personal history of nicotine dependence: Secondary | ICD-10-CM | POA: Insufficient documentation

## 2019-02-18 DIAGNOSIS — N183 Chronic kidney disease, stage 3 (moderate): Secondary | ICD-10-CM | POA: Diagnosis not present

## 2019-02-18 DIAGNOSIS — I129 Hypertensive chronic kidney disease with stage 1 through stage 4 chronic kidney disease, or unspecified chronic kidney disease: Secondary | ICD-10-CM | POA: Diagnosis not present

## 2019-02-18 DIAGNOSIS — M25552 Pain in left hip: Secondary | ICD-10-CM | POA: Insufficient documentation

## 2019-02-18 DIAGNOSIS — Z79899 Other long term (current) drug therapy: Secondary | ICD-10-CM | POA: Insufficient documentation

## 2019-02-18 DIAGNOSIS — E1122 Type 2 diabetes mellitus with diabetic chronic kidney disease: Secondary | ICD-10-CM | POA: Diagnosis not present

## 2019-02-18 DIAGNOSIS — M79605 Pain in left leg: Secondary | ICD-10-CM | POA: Diagnosis present

## 2019-02-18 MED ORDER — ORPHENADRINE CITRATE 30 MG/ML IJ SOLN
60.0000 mg | Freq: Two times a day (BID) | INTRAMUSCULAR | Status: DC
  Administered 2019-02-18: 60 mg via INTRAMUSCULAR
  Filled 2019-02-18: qty 2

## 2019-02-18 MED ORDER — TRAMADOL HCL 50 MG PO TABS
50.0000 mg | ORAL_TABLET | Freq: Once | ORAL | Status: AC
  Administered 2019-02-18: 21:00:00 50 mg via ORAL
  Filled 2019-02-18: qty 1

## 2019-02-18 MED ORDER — KETOROLAC TROMETHAMINE 30 MG/ML IJ SOLN
30.0000 mg | Freq: Once | INTRAMUSCULAR | Status: AC
  Administered 2019-02-18: 18:00:00 30 mg via INTRAMUSCULAR
  Filled 2019-02-18: qty 1

## 2019-02-18 MED ORDER — SODIUM CHLORIDE 0.9 % IV BOLUS
1000.0000 mL | Freq: Once | INTRAVENOUS | Status: AC
  Administered 2019-02-18: 19:00:00 1000 mL via INTRAVENOUS

## 2019-02-18 MED ORDER — TRAMADOL HCL 50 MG PO TABS: 50.0000 mg | ORAL_TABLET | Freq: Four times a day (QID) | ORAL | 0 refills | Status: DC | PRN

## 2019-02-18 NOTE — ED Notes (Signed)
Patient given water and graham crackers

## 2019-02-18 NOTE — ED Notes (Addendum)
Pt states "I called my husband to tell him about the tramadol, and he told me it was a good medicine for pain." explanation again provided to pt regarding need to wait for additional pain medications at this time due to hypotension. Pt continues to talk on phone while rn starting iv and administering NS bolus. Pt states "my blood pressure is always low and my doctor doesn't care, he doesn't do anything about it, why start now." explanation again provided to pt regarding need for NS bolus.

## 2019-02-18 NOTE — ED Notes (Signed)
Patient requesting warm blanket. [place] tech reported she will give patient one once back to [place] room

## 2019-02-18 NOTE — ED Notes (Signed)
PA in to reassess 

## 2019-02-18 NOTE — ED Notes (Signed)
Patient transported to Ultrasound 

## 2019-02-18 NOTE — ED Provider Notes (Signed)
[facility] Emergency Department Provider Note  ____________________________________________  Time seen: Approximately 5:26 PM  I have reviewed the triage vital signs and the nursing notes.   HISTORY  Chief Complaint Leg Pain    HPI Jennifer Bright is a 53 y.o. female with a history of osteopenia, restless leg syndrome and bipolar disorder, presents tearfully to the emergency department with right anterior thigh pain which radiates to the right posterior knee for the past 2 to 3 days..  Patient describes pain as aching and constant.  She denies recent falls or traumas but states "I do fall from time to time."  She denies a history of prolonged immobilization, recent surgery, long distance travel or prior history of DVT.  She has not experienced similar symptoms in the past.  She states "I just need something to relax the pain".  She denies fever chills.  No redness or rash of the lower left lower extremity.  Patient states that she has been able to ambulate with some difficulty.  No other alleviating measures have been attempted.         Past Medical History:  Diagnosis Date  . Acid reflux   . Anemia   . Anxiety   . Bipolar disorder (HCC)   . Chronic kidney disease    STAGE 3  . Depression   . Diabetes mellitus    NIDD  . Essential tremor   . Family history of ovarian cancer    Pt doesn't meet Medicare genetic testing guidelines  . Hyperlipidemia   . Hypothyroid   . IBS (irritable bowel syndrome)   . Migraine headache    vestibular migraine  . Obesity   . Osteopenia   . Restless leg syndrome   . Sarcoidosis   . Sleep apnea   . Syncope \    Patient Active Problem List   Diagnosis Date Noted  . Vaginal atrophy 07/12/2017  . Polypharmacy 08/14/2016  . 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 Hypercalcemia 07/24/2016  . Sarcoidosis 07/24/2016  . Hyperlipidemia, unspecified 11/15/2015  . Type 2 diabetes mellitus without complication,  without long-term current use of insulin (HCC) 11/15/2015  . Anxiety disorder 07/29/2015  . Anxiety 05/12/2015  . Bipolar depression (HCC) 05/12/2015  . Essential (primary) hypertension 05/12/2015  . Benign essential tremor 05/12/2015  . Gastro-esophageal reflux disease without esophagitis 05/12/2015  . Cephalalgia 05/12/2015  . Hypersomnia with sleep apnea 05/12/2015  . Restless leg 05/12/2015  . Diabetes (HCC) 05/12/2015  . Hypothyroidism 05/12/2015  . Migraine variant 05/12/2015  . Bipolar disorder (HCC) 05/12/2015  . Type 2 diabetes mellitus without complications (HCC) 05/12/2015  . Chronic migraine without aura 09/03/2012  . 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 Syncope and collapse 03/17/2012  . Depression 03/17/2012  . Migraine 03/17/2012    Past Surgical History:  Procedure Laterality Date  . ABDOMINAL HYSTERECTOMY    . APPENDECTOMY    . BLADDER SURGERY     bladder tact  . CATARACT EXTRACTION W/PHACO Right 09/21/2018   Procedure: CATARACT EXTRACTION PHACO AND INTRAOCULAR LENS PLACEMENT (IOC);  Surgeon: Nevada Crane, MD;  Location: [facility] ORS;  Service: Ophthalmology;  Laterality: Right;  [place]  00:20 CDE 00.82 Fluid pack lot # [id number]  . CATARACT EXTRACTION W/PHACO Left 10/17/2018   Procedure: CATARACT EXTRACTION PHACO AND INTRAOCULAR LENS PLACEMENT (IOC)  LEFT;  Surgeon: Nevada Crane, MD;  Location: [facility];  Service: Ophthalmology;  Laterality: Left;  diabetic - diet controlled  . CESAREAN SECTION    .  CHOLECYSTECTOMY    . COLONOSCOPY WITH PROPOFOL N/A 04/05/2017   Procedure: COLONOSCOPY WITH PROPOFOL;  Surgeon: Christena Deem, MD;  Location: [facility] ENDOSCOPY;  Service: Endoscopy;  Laterality: N/A;  . ESOPHAGOGASTRODUODENOSCOPY (EGD) WITH PROPOFOL N/A 08/28/2016   Procedure: ESOPHAGOGASTRODUODENOSCOPY (EGD) WITH PROPOFOL;  Surgeon: Christena Deem, MD;  Location: [facility] ENDOSCOPY;  Service: Endoscopy;  Laterality: N/A;  . HERNIA  REPAIR    . INGUINAL HERNIA REPAIR     rt and left  . lexiscan cardiolite    . TILT TABLE STUDY N/A 04/01/2012   Procedure: TILT TABLE STUDY;  Surgeon: Duke Salvia, MD;  Location: [facility] CATH LAB;  Service: Cardiovascular;  Laterality: N/A;    Prior to Admission medications   Medication Sig Start Date End Date Taking? Authorizing Provider  Armodafinil 250 MG tablet Take 250 mg by mouth daily.    [provider]  atorvastatin (LIPITOR) 40 MG tablet Take 40 mg by mouth daily.     [provider]  benztropine (COGENTIN) 1 MG tablet Take 1 mg by mouth 2 (two) times daily.     [provider]  buPROPion (WELLBUTRIN XL) 300 MG 24 hr tablet Take 300 mg by mouth daily.    [provider]  diazepam (VALIUM) 2 MG tablet Take 2 mg by mouth every 6 (six) hours as needed for anxiety. 2 mg breakfast and lunch, 4 mg bedtime    [provider]  docusate sodium (COLACE) 100 MG capsule Take 100 mg by mouth 2 (two) times daily.     [provider]  enalapril (VASOTEC) 2.5 MG tablet Take 2.5 mg by mouth daily.    [provider]  escitalopram (LEXAPRO) 20 MG tablet Take 20 mg by mouth daily.    [provider]  fish oil-omega-3 fatty acids 1000 MG capsule Take 1 g by mouth daily.     [provider]  furosemide (LASIX) 20 MG tablet Take 20 mg by mouth daily.    [provider]  glucose blood (ONE TOUCH ULTRA TEST) test strip TEST TWICE A DAY AS DIRECTED 07/15/16   [provider]  levETIRAcetam (KEPPRA) 250 MG tablet Take 500 mg by mouth 2 (two) times daily.    [provider]  levothyroxine (SYNTHROID, LEVOTHROID) 100 MCG tablet Take 100 mcg by mouth daily before breakfast. Takes at bedtime    [provider]  liothyronine (CYTOMEL) 5 MCG tablet Take 5 mcg by mouth 2 (two) times daily.  05/11/16   [provider]  methocarbamol (ROBAXIN) 750 MG tablet Take 750 mg by mouth as needed for  muscle spasms.    [provider]  Multiple Vitamin (MULITIVITAMIN WITH MINERALS) TABS Take 1 tablet by mouth daily.    [provider]  Nutritional Supplements (MELATONIN PO) Take 10 mg by mouth at bedtime.     [provider]  Dola Argyle LANCETS FINE MISC USE AS DIRECTED 07/15/16   [provider]  Ospemifene (OSPHENA) 60 MG TABS Take 1 tablet by mouth daily. 08/10/18   Nadara Mustard, MD  pantoprazole (PROTONIX) 40 MG tablet Take 40 mg by mouth daily.    [provider]  primidone (MYSOLINE) 50 MG tablet Take 50-100 mg by mouth See admin instructions. 50 mg twice daily(breakfast and lunch) , 100 mg at bedtime    [provider]  pyridOXINE (VITAMIN B-6) 100 MG tablet Take 100 mg by mouth daily.    [provider]  topiramate (TOPAMAX)  200 MG tablet Take 300-400 mg by mouth See admin instructions. pt take 2 tabs (400 mg) q am, and 1.5 (300 mg) hs    [provider]  traMADol (ULTRAM) 50 MG tablet Take 1 tablet (50 mg total) by mouth every 6 (six) hours as needed for up to 3 days. 02/18/19 02/21/19  Orvil Feil, PA-C  verapamil (CALAN) 40 MG tablet Take 40 mg by mouth at bedtime.    [provider]    Allergies Cleocin [clindamycin hcl]  Family History  Problem Relation Age of Onset  . Colon cancer Father   . Stomach cancer Father   . Hypertension Mother   . Hyperlipidemia Mother   . Alcohol abuse Brother   . Irritable bowel syndrome Sister   . Breast cancer Paternal Aunt   . Ovarian cancer Paternal Aunt   . Kidney cancer Neg Hx   . Kidney disease Neg Hx   . Prostate cancer Neg Hx     Social History Social History   Tobacco Use  . Smoking status: Former Smoker    Types: Cigarettes    Last attempt to quit: 2015    Years since quitting: 5.3  . Smokeless tobacco: Never Used  Substance Use Topics  . Alcohol use: No  . Drug use: No     Review of Systems  Constitutional: No  fever/chills Eyes: No visual changes. No discharge ENT: No upper respiratory complaints. Cardiovascular: no chest pain. Respiratory: no cough. No SOB. Gastrointestinal: No abdominal pain.  No nausea, no vomiting.  No diarrhea.  No constipation. Genitourinary: Negative for dysuria. No hematuria Musculoskeletal:Patient has left thigh pain.  Skin: Negative for rash, abrasions, lacerations, ecchymosis. Neurological: Negative for headaches, focal weakness or numbness.  ____________________________________________   PHYSICAL EXAM:  VITAL SIGNS: ED Triage Vitals  Enc Vitals Group     BP 02/18/19 1631 106/72     Pulse Rate 02/18/19 1631 85     Resp 02/18/19 1631 20     Temp 02/18/19 1631 98.1 F (36.7 C)     Temp Source 02/18/19 1631 Oral     SpO2 02/18/19 1631 97 %     Weight 02/18/19 1633 165 lb (74.8 kg)     Height 02/18/19 1633  (1.626 m)     Head Circumference --      Peak Flow --      Pain Score 02/18/19 1633 10     Pain Loc --      Pain Edu? --      Excl. in GC? --      Constitutional: Alert and oriented. Well appearing and in no acute distress. Eyes: Conjunctivae are normal. PERRL. EOMI. Head: Atraumatic. Cardiovascular: Normal rate, regular rhythm. Normal S1 and S2.  Good peripheral circulation. Respiratory: Normal respiratory effort without tachypnea or retractions. Lungs CTAB. Good air entry to the bases with no decreased or absent breath sounds. Gastrointestinal: Bowel sounds 4 quadrants. Soft and nontender to palpation. No guarding or rigidity. No palpable masses. No distention. No CVA tenderness. Musculoskeletal: Patient has 5 out of 5 strength in the lower extremities bilaterally and symmetrically.  Patient has groin pain with internal and external rotation at the left hip.  Full range of motion at the left knee and left ankle.  Patient has no weakness of the left great toe and has a palpable dorsalis pedis pulse, left.  Capillary refill less than 3 seconds,  left. Neurologic:  Normal speech and language. No gross focal neurologic deficits  are appreciated.  Skin:  Skin is warm, dry and intact. No rash noted. Psychiatric: Mood and affect are normal. Speech and behavior are normal. Patient exhibits appropriate insight and judgement.   ____________________________________________   LABS (all labs ordered are listed, but only abnormal results are displayed)  Labs Reviewed - No data to display ____________________________________________  EKG   ____________________________________________  RADIOLOGY I personally viewed and evaluated these images as part of my medical decision making, as well as reviewing the written report by the radiologist.    [place] Venous Img Lower Unilateral Left  Result Date: 02/18/2019 CLINICAL DATA:  LEFT thigh and posterior knee pain EXAM: LEFT LOWER EXTREMITY VENOUS DOPPLER ULTRASOUND TECHNIQUE: Gray-scale sonography with graded compression, as well as color Doppler and duplex ultrasound were performed to evaluate the lower extremity deep venous systems from the level of the common femoral vein and including the common femoral, femoral, profunda femoral, popliteal and calf veins including the posterior tibial, peroneal and gastrocnemius veins when visible. The superficial great saphenous vein was also interrogated. Spectral Doppler was utilized to evaluate flow at rest and with distal augmentation maneuvers in the common femoral, femoral and popliteal veins. COMPARISON:  None FINDINGS: Contralateral Common Femoral Vein: Respiratory phasicity is normal and symmetric with the symptomatic side. No evidence of thrombus. Normal compressibility. Common Femoral Vein: No evidence of thrombus. Normal compressibility, respiratory phasicity and response to augmentation. Saphenofemoral Junction: No evidence of thrombus. Normal compressibility and flow on color Doppler imaging. Profunda Femoral Vein: No evidence of thrombus. Normal  compressibility and flow on color Doppler imaging. Femoral Vein: No evidence of thrombus. Normal compressibility, respiratory phasicity and response to augmentation. Popliteal Vein: No evidence of thrombus. Normal compressibility, respiratory phasicity and response to augmentation. Calf Veins: No evidence of thrombus. Normal compressibility and flow on color Doppler imaging. Superficial Great Saphenous Vein: No evidence of thrombus. Normal compressibility. Venous Reflux:  None. Other Findings:  None. IMPRESSION: No evidence of deep venous thrombosis in the LEFT lower extremity. Electronically Signed   By: Ulyses SouthwardMark  Boles M.D.   On: 02/18/2019 18:06   Dg Hip Unilat W Or Wo Pelvis 2-3 Views Left  Result Date: 02/18/2019 CLINICAL DATA:  Pain to back of left thigh and knee for 4 days. No fall or injury. EXAM: DG HIP (WITH OR WITHOUT PELVIS) 2-3V LEFT COMPARISON:  None. FINDINGS: There is no evidence of hip fracture or dislocation. There is no evidence of arthropathy or other focal bone abnormality. IMPRESSION: Negative. Electronically Signed   By: Gerome Samavid  Williams III M.D   On: 02/18/2019 18:26    ____________________________________________    PROCEDURES  Procedure(s) performed:    Procedures    Medications  orphenadrine (NORFLEX) injection 60 mg (60 mg Intramuscular Given 02/18/19 1731)  ketorolac (TORADOL) 30 MG/ML injection 30 mg (30 mg Intramuscular Given 02/18/19 1734)  traMADol (ULTRAM) tablet 50 mg (50 mg Oral Given 02/18/19 2031)  sodium chloride 0.9 % bolus 1,000 mL (0 mLs Intravenous Stopped 02/18/19 2014)     ____________________________________________   INITIAL IMPRESSION / ASSESSMENT AND PLAN / ED COURSE  Pertinent labs & imaging results that were available during my care of the patient were reviewed by me and considered in my medical decision making (see chart for details).  Review of the [place] CSRS was performed in accordance of the [facility] prior to dispensing any controlled drugs.          Assessment and Plan: Left lower extremity pain:  53 year old female presents to the emergency department  with left lower extremity pain for the past 2 to 3 days described as aching in nature.  On physical exam, patient's pain was worsened with internal and external rotation of the left hip.  She had a reassuring neurovascular exam.  She had no erythema or swelling of the left lower extremity.  Differential diagnosis originally included osteoarthritis of the left hip, labral tear of the left hip, DVT and unspecified musculoskeletal pain.  X-ray examination of the left hip revealed no significant arthritic changes or other bony abnormalities.  No evidence of thromboembolism on venous ultrasound.  Patient was initially given Toradol and Norflex in the emergency department which did not resolve her pain.  I offered to give patient tramadol in the emergency department which she accepted but declined request for stronger pain medication.  Patient was discharged with a short course of tramadol and advised to follow-up with orthopedics as needed.  All patient questions were answered.  ____________________________________________  FINAL CLINICAL IMPRESSION(S) / ED DIAGNOSES  Final diagnoses:  Left hip pain      NEW MEDICATIONS STARTED DURING THIS VISIT:  ED Discharge Orders         Ordered    traMADol (ULTRAM) 50 MG tablet  Every 6 hours PRN     02/18/19 1913              This chart was dictated using voice recognition software/Dragon. Despite best efforts to proofread, errors can occur which can change the meaning. Any change was purely unintentional.    Gasper Lloyd 02/18/19 2111    Sharman Cheek, MD 02/20/19 [phone]

## 2019-02-18 NOTE — ED Triage Notes (Signed)
Pain back of L thigh and knee x 4 days. No fall or injury at onset.

## 2019-02-18 NOTE — ED Notes (Signed)
Pt up to restroom.

## 2019-02-18 NOTE — ED Notes (Signed)
Pt ambulatory out to desk states pain medication is not working. Pa aware.

## 2019-02-18 NOTE — ED Notes (Signed)
Report received from Amber P. 

## 2019-02-19 ENCOUNTER — Encounter (HOSPITAL_COMMUNITY): Payer: Self-pay | Admitting: Emergency Medicine

## 2019-02-19 ENCOUNTER — Other Ambulatory Visit: Payer: Self-pay

## 2019-02-19 ENCOUNTER — Emergency Department (HOSPITAL_COMMUNITY)
Admission: EM | Admit: 2019-02-19 | Discharge: 2019-02-19 | Disposition: A | Discharge status: Home / Self Care | Payer: Medicare Other | Attending: Emergency Medicine | Admitting: Emergency Medicine

## 2019-02-19 DIAGNOSIS — Z79899 Other long term (current) drug therapy: Secondary | ICD-10-CM | POA: Diagnosis not present

## 2019-02-19 DIAGNOSIS — N183 Chronic kidney disease, stage 3 (moderate): Secondary | ICD-10-CM | POA: Diagnosis not present

## 2019-02-19 DIAGNOSIS — Z87891 Personal history of nicotine dependence: Secondary | ICD-10-CM | POA: Insufficient documentation

## 2019-02-19 DIAGNOSIS — I129 Hypertensive chronic kidney disease with stage 1 through stage 4 chronic kidney disease, or unspecified chronic kidney disease: Secondary | ICD-10-CM | POA: Insufficient documentation

## 2019-02-19 DIAGNOSIS — M79605 Pain in left leg: Secondary | ICD-10-CM | POA: Insufficient documentation

## 2019-02-19 DIAGNOSIS — E1122 Type 2 diabetes mellitus with diabetic chronic kidney disease: Secondary | ICD-10-CM | POA: Insufficient documentation

## 2019-02-19 DIAGNOSIS — E039 Hypothyroidism, unspecified: Secondary | ICD-10-CM | POA: Insufficient documentation

## 2019-02-19 MED ORDER — TRAMADOL HCL 50 MG PO TABS: ORAL_TABLET | ORAL | 0 refills | Status: DC

## 2019-02-19 MED ORDER — HYDROMORPHONE HCL 1 MG/ML IJ SOLN
1.0000 mg | Freq: Once | INTRAMUSCULAR | Status: AC
  Administered 2019-02-19: 1 mg via INTRAMUSCULAR
  Filled 2019-02-19: qty 1

## 2019-02-19 MED ORDER — METHYLPREDNISOLONE 4 MG PO TBPK: ORAL_TABLET | ORAL | 0 refills | Status: DC

## 2019-02-19 MED ORDER — ORPHENADRINE CITRATE ER 100 MG PO TB12: 100.0000 mg | ORAL_TABLET | Freq: Two times a day (BID) | ORAL | 0 refills | Status: DC

## 2019-02-19 MED ORDER — DEXAMETHASONE SODIUM PHOSPHATE 10 MG/ML IJ SOLN
10.0000 mg | Freq: Once | INTRAMUSCULAR | Status: AC
  Administered 2019-02-19: 10 mg via INTRAVENOUS
  Filled 2019-02-19: qty 1

## 2019-02-19 NOTE — ED Provider Notes (Signed)
[facility] EMERGENCY DEPARTMENT Provider Note   CSN: [id number] Arrival date & time: 02/19/19  1520    History   Chief Complaint Chief Complaint  Patient presents with   Leg Pain    HPI Jennifer Bright is a 53 y.o. female.     HPI Patient reports that she has pain that started approximately 6 days ago on Tuesday.  First he thought she was getting a muscle cramp or charley horse in the front of her left thigh.  She reports the pain however has progressed and it became an intense deep pain right down the front of her leg.  Reports that seems like gets up in her groin and then coming straight down the front of the leg.  Is worse with walking or standing.  She reports she has a bit of pain that is down to the level of the calf but that is not so severe.  She denies that the lower leg feels numb.  No loss of sensation in the foot or the leg.  Denies that it feels weak or has impeded her walking except for in that it is painful.  Reports she did fall about 3 weeks ago but did not think she had any significant injury at that time.  She denies she is experiencing significant back pain in association with this.  She has not had bladder incontinence, no pain or burning with urination.  No associated abdominal pain.  She reports she was trying some Salonpaz and that was effective for pain for the first couple of days.  She reports this just over the last 3 days that has become nearly unbearable.  She was seen in [place] yesterday.  She reports that she got a prescription for tramadol which she could not pick up because the pharmacy was closed.  At [facility] she did have a x-ray and an DVT study.  Both were negative.  Denies she is ever had similar pain or problems. Past Medical History:  Diagnosis Date   Acid reflux    Anemia    Anxiety    Bipolar disorder (HCC)    Chronic kidney disease    STAGE 3   Depression    Diabetes mellitus    NIDD   Essential tremor      Family history of ovarian cancer    Pt doesn't meet Medicare genetic testing guidelines   Hyperlipidemia    Hypothyroid    IBS (irritable bowel syndrome)    Migraine headache    vestibular migraine   Obesity    Osteopenia    Restless leg syndrome    Sarcoidosis    Sleep apnea    Syncope \    Patient Active Problem List   Diagnosis Date Noted   Vaginal atrophy 07/12/2017   Polypharmacy 08/14/2016   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   Hypercalcemia 07/24/2016   Sarcoidosis 07/24/2016   Hyperlipidemia, unspecified 11/15/2015   Type 2 diabetes mellitus without complication, without long-term current use of insulin (HCC) 11/15/2015   Anxiety disorder 07/29/2015   Anxiety 05/12/2015   Bipolar depression (HCC) 05/12/2015   Essential (primary) hypertension 05/12/2015   Benign essential tremor 05/12/2015   Gastro-esophageal reflux disease without esophagitis 05/12/2015   Cephalalgia 05/12/2015   Hypersomnia with sleep apnea 05/12/2015   Restless leg 05/12/2015   Diabetes (HCC) 05/12/2015   Hypothyroidism 05/12/2015   Migraine variant 05/12/2015   Bipolar disorder (HCC) 05/12/2015   Type 2 diabetes mellitus without complications (HCC) 05/12/2015  Chronic migraine without aura 09/03/2012   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   Syncope and collapse 03/17/2012   Depression 03/17/2012   Migraine 03/17/2012    Past Surgical History:  Procedure Laterality Date   ABDOMINAL HYSTERECTOMY     APPENDECTOMY     BLADDER SURGERY     bladder tact   CATARACT EXTRACTION W/PHACO Right 09/21/2018   Procedure: CATARACT EXTRACTION PHACO AND INTRAOCULAR LENS PLACEMENT (IOC);  Surgeon: Nevada Crane, MD;  Location: [facility] ORS;  Service: Ophthalmology;  Laterality: Right;  [place]  00:20 CDE 00.82 Fluid pack lot # [id number]   CATARACT EXTRACTION W/PHACO Left 10/17/2018   Procedure: CATARACT EXTRACTION PHACO AND INTRAOCULAR LENS PLACEMENT  (IOC)  LEFT;  Surgeon: Nevada Crane, MD;  Location: [facility];  Service: Ophthalmology;  Laterality: Left;  diabetic - diet controlled   CESAREAN SECTION     CHOLECYSTECTOMY     COLONOSCOPY WITH PROPOFOL N/A 04/05/2017   Procedure: COLONOSCOPY WITH PROPOFOL;  Surgeon: Christena Deem, MD;  Location: [facility] ENDOSCOPY;  Service: Endoscopy;  Laterality: N/A;   ESOPHAGOGASTRODUODENOSCOPY (EGD) WITH PROPOFOL N/A 08/28/2016   Procedure: ESOPHAGOGASTRODUODENOSCOPY (EGD) WITH PROPOFOL;  Surgeon: Christena Deem, MD;  Location: [facility] ENDOSCOPY;  Service: Endoscopy;  Laterality: N/A;   HERNIA REPAIR     INGUINAL HERNIA REPAIR     rt and left   lexiscan cardiolite     TILT TABLE STUDY N/A 04/01/2012   Procedure: TILT TABLE STUDY;  Surgeon: Duke Salvia, MD;  Location: [facility] CATH LAB;  Service: Cardiovascular;  Laterality: N/A;     OB History    Gravida  5   Para  4   Term  4   Preterm      AB  1   Living  4     SAB      TAB  1   Ectopic      Multiple      Live Births               Home Medications    Prior to Admission medications   Medication Sig Start Date End Date Taking? Authorizing Provider  Armodafinil 250 MG tablet Take 250 mg by mouth daily.    [provider]  atorvastatin (LIPITOR) 40 MG tablet Take 40 mg by mouth daily.     [provider]  benztropine (COGENTIN) 1 MG tablet Take 1 mg by mouth 2 (two) times daily.     [provider]  buPROPion (WELLBUTRIN XL) 300 MG 24 hr tablet Take 300 mg by mouth daily.    [provider]  diazepam (VALIUM) 2 MG tablet Take 2 mg by mouth every 6 (six) hours as needed for anxiety. 2 mg breakfast and lunch, 4 mg bedtime    [provider]  docusate sodium (COLACE) 100 MG capsule Take 100 mg by mouth 2 (two) times daily.     [provider]  enalapril (VASOTEC) 2.5 MG tablet Take 2.5 mg by mouth daily.    [provider]  escitalopram  (LEXAPRO) 20 MG tablet Take 20 mg by mouth daily.    [provider]  fish oil-omega-3 fatty acids 1000 MG capsule Take 1 g by mouth daily.     [provider]  furosemide (LASIX) 20 MG tablet Take 20 mg by mouth daily.    [provider]  glucose blood (ONE TOUCH ULTRA TEST) test strip TEST TWICE A DAY AS DIRECTED 07/15/16   [provider]  levETIRAcetam (KEPPRA) 250 MG tablet Take 500 mg by mouth 2 (two) times daily.    [provider]  levothyroxine (SYNTHROID, LEVOTHROID) 100 MCG tablet Take 100 mcg by mouth daily before breakfast. Takes at bedtime    [provider]  liothyronine (CYTOMEL) 5 MCG tablet Take 5 mcg by mouth 2 (two) times daily.  05/11/16   [provider]  methocarbamol (ROBAXIN) 750 MG tablet Take 750 mg by mouth as needed for muscle spasms.    [provider]  methylPREDNISolone (MEDROL DOSEPAK) 4 MG TBPK tablet As per pack 02/19/19   Arby Barrette, MD  Multiple Vitamin (MULITIVITAMIN WITH MINERALS) TABS Take 1 tablet by mouth daily.    [provider]  Nutritional Supplements (MELATONIN PO) Take 10 mg by mouth at bedtime.     [provider]  Dola Argyle LANCETS FINE MISC USE AS DIRECTED 07/15/16   [provider]  orphenadrine (NORFLEX) 100 MG tablet Take 1 tablet (100 mg total) by mouth 2 (two) times daily. 02/19/19   Arby Barrette, MD  Ospemifene (OSPHENA) 60 MG TABS Take 1 tablet by mouth daily. 08/10/18   Nadara Mustard, MD  pantoprazole (PROTONIX) 40 MG tablet Take 40 mg by mouth daily.    [provider]  primidone (MYSOLINE) 50 MG tablet Take 50-100 mg by mouth See admin instructions. 50 mg twice daily(breakfast and lunch) , 100 mg at bedtime    [provider]  pyridOXINE (VITAMIN B-6) 100 MG tablet Take 100 mg by mouth daily.    [provider]  topiramate (TOPAMAX) 200 MG tablet Take 300-400 mg by mouth See admin instructions. pt take 2  tabs (400 mg) q am, and 1.5 (300 mg) hs    [provider]  traMADol (ULTRAM) 50 MG tablet 2 tablets every 6 hours as needed for pain. 02/19/19   Arby Barrette, MD  verapamil (CALAN) 40 MG tablet Take 40 mg by mouth at bedtime.    [provider]    Family History Family History  Problem Relation Age of Onset   Colon cancer Father    Stomach cancer Father    Hypertension Mother    Hyperlipidemia Mother    Alcohol abuse Brother    Irritable bowel syndrome Sister    Breast cancer Paternal Aunt    Ovarian cancer Paternal Aunt    Kidney cancer Neg Hx    Kidney disease Neg Hx    Prostate cancer Neg Hx     Social History Social History   Tobacco Use   Smoking status: Former Smoker    Types: Cigarettes    Last attempt to quit: 2015    Years since quitting: 5.3   Smokeless tobacco: Never Used  Substance Use Topics   Alcohol use: No   Drug use: No     Allergies   Cleocin [clindamycin hcl]   Review of Systems Review of Systems 10 Systems reviewed and are negative for acute change except as noted in the HPI.   Physical Exam Updated Vital Signs BP 117/74 (BP Location: Right Arm)    Pulse 92    Temp 98.3 F (36.8 C) (Oral)    Resp 20    SpO2 100%   Physical Exam Constitutional:      Comments: Is alert and appropriate.  Nontoxic.  She is tearful.  HENT:     Head: Normocephalic and atraumatic.     Mouth/Throat:     Pharynx: Oropharynx is clear.  Eyes:     Extraocular Movements: Extraocular movements intact.     Pupils: Pupils are equal, round, and reactive to light.  Cardiovascular:     Rate and Rhythm: Normal rate and regular rhythm.  Pulmonary:     Effort: Pulmonary effort is normal.     Breath sounds: Normal breath sounds.  Abdominal:     General: There is no distension.     Palpations: Abdomen is soft.     Tenderness: There is no abdominal tenderness. There is no guarding.  Musculoskeletal:     Comments: Patient has pain that  starts just at about the inguinal crease and travels down the front of her thigh on the left.  Femoral pulses intact.  There is no usual pulsations or mass in this area.  Patient does identify that it is in the location of pain and then tracking down the front of her leg.  No soft tissue anomaly within the soft tissues of the thigh where she has pain.  She reports it does feel even slightly better to massage the soft tissues.  The lower leg has no swelling or tenderness of the calf.  Patient has normal dorsalis pedis pulse.  The foot is warm and dry.  She denies pain with internal and external rotation at the hip.  Some increased pain with straight leg raise.  Lower extremity is symmetric and normal as compared to the left.  Right also has normal dorsalis pedis pulse and warm and dry foot.  Neurological:     General: No focal deficit present.     Mental Status: She is oriented to person, place, and time.     Coordination: Coordination normal.  Psychiatric:        Mood and Affect: Mood normal.      ED Treatments / Results  Labs (all labs ordered are listed, but only abnormal results are displayed) Labs Reviewed - No data to display  EKG None  Radiology [place] Venous Img Lower Unilateral Left  Result Date: 02/18/2019 CLINICAL DATA:  LEFT thigh and posterior knee pain EXAM: LEFT LOWER EXTREMITY VENOUS DOPPLER ULTRASOUND TECHNIQUE: Gray-scale sonography with graded compression, as well as color Doppler and duplex ultrasound were performed to evaluate the lower extremity deep venous systems from the level of the common femoral vein and including the common femoral, femoral, profunda femoral, popliteal and calf veins including the posterior tibial, peroneal and gastrocnemius veins when visible. The superficial great saphenous vein was also interrogated. Spectral Doppler was utilized to evaluate flow at rest and with distal augmentation maneuvers in the common femoral, femoral and popliteal veins.  COMPARISON:  None FINDINGS: Contralateral Common Femoral Vein: Respiratory phasicity is normal and symmetric with the symptomatic side. No evidence of thrombus. Normal compressibility. Common Femoral Vein: No evidence of thrombus. Normal compressibility, respiratory phasicity and response to augmentation. Saphenofemoral Junction: No evidence of thrombus. Normal compressibility and flow on color Doppler imaging. Profunda Femoral Vein: No evidence of thrombus. Normal compressibility and flow on color Doppler imaging. Femoral Vein: No evidence of thrombus. Normal compressibility, respiratory phasicity and response to augmentation. Popliteal Vein: No evidence of thrombus. Normal compressibility, respiratory phasicity and response to augmentation. Calf Veins: No evidence of thrombus. Normal compressibility and flow on color Doppler imaging. Superficial Great Saphenous Vein: No evidence of thrombus. Normal compressibility. Venous Reflux:  None. Other Findings:  None. IMPRESSION: No evidence of deep venous thrombosis in the LEFT lower extremity. Electronically Signed   By: Ulyses SouthwardMark  Boles M.D.   On:  02/18/2019 18:06   Dg Hip Unilat W Or Wo Pelvis 2-3 Views Left  Result Date: 02/18/2019 CLINICAL DATA:  Pain to back of left thigh and knee for 4 days. No fall or injury. EXAM: DG HIP (WITH OR WITHOUT PELVIS) 2-3V LEFT COMPARISON:  None. FINDINGS: There is no evidence of hip fracture or dislocation. There is no evidence of arthropathy or other focal bone abnormality. IMPRESSION: Negative. Electronically Signed   By: Gerome Sam III M.D   On: 02/18/2019 18:26    Procedures Procedures (including critical care time)  Medications Ordered in ED Medications  HYDROmorphone (DILAUDID) injection 1 mg (1 mg Intramuscular Given 02/19/19 1656)  dexamethasone (DECADRON) injection 10 mg (10 mg Intravenous Given 02/19/19 1656)     Initial Impression / Assessment and Plan / ED Course  I have reviewed the triage vital signs and the  nursing notes.  Pertinent labs & imaging results that were available during my care of the patient were reviewed by me and considered in my medical decision making (see chart for details).       Patient has severe left leg pain that started approximately 6 days ago.  It was gradual in onset.  She was seen at [facility] yesterday with a negative DVT study and negative hip x-ray.  Reviewed additional EMR studies.  Patient has had a CT of the abdomen and pelvis within the past 6 months with no bony tumors or lesions.  Patient's vascular exam is normal.  Soft tissue exam is normal.  Do not suspect a septic hip.  Pain was not exacerbated by rotational movements at the hip joint.  Pain is suggestive of a radiculopathy.  Have high suspicion for neuropathic pain.  He is not having any active back pain or motor weakness.  No sign of a neurologic emergency.  This time will treat with a Medrol Dosepak, muscle relaxer and tramadol (patient had not been able to fill the prescription given to her from [place] because the pharmacy was closed).  Return precautions are reviewed.  Patient is instructed on close follow-up plan with PCP for further diagnostic evaluation if indicated.  Final Clinical Impressions(s) / ED Diagnoses   Final diagnoses:  Left leg pain    ED Discharge Orders         Ordered    methylPREDNISolone (MEDROL DOSEPAK) 4 MG TBPK tablet     02/19/19 1735    orphenadrine (NORFLEX) 100 MG tablet  2 times daily     02/19/19 1735    traMADol (ULTRAM) 50 MG tablet     02/19/19 1735           Arby Barrette, MD 02/19/19 1746

## 2019-02-19 NOTE — ED Notes (Signed)
Pt verbalized understanding of d/c instructions and has no further questions, VSS, NAD. Pt d/c home with husband driving. Pt  To follow up with pcp this week. Given prescriptions for steroids, pain reliever, and muscle relaxer.

## 2019-02-19 NOTE — Discharge Instructions (Signed)
1.  At this time, the exact cause of your pain is unclear.  It could be related to a pinched nerve.  You need to see your family doctor this week for recheck. 2.  At this time you are getting treatment for musculoskeletal pain and possible pinched nerve.  Take the Medrol pack as prescribed.  Take Norflex as needed for muscle spasms or pain.  Take 1-2 tramadol every 6 hours as needed for more severe pain 3.  Return to the emergency department if your leg becomes weak or numb, you develop fever, you develop difficulty controlling her bladder or other concerning symptoms.

## 2019-02-19 NOTE — ED Triage Notes (Addendum)
Pt reports left thigh pain x 6 days, no known injury. Feels like someone is ripping her bone out of her skin, unable to sleep x 2 days. Tearful in triage. States [facility] would not give her anything for pain, can't take ibuprofen.

## 2019-02-20 ENCOUNTER — Telehealth (HOSPITAL_COMMUNITY): Payer: Self-pay | Admitting: Professional

## 2019-03-21 ENCOUNTER — Telehealth (HOSPITAL_COMMUNITY): Payer: Self-pay | Admitting: Psychiatry

## 2019-04-18 ENCOUNTER — Emergency Department
Admission: EM | Admit: 2019-04-18 | Discharge: 2019-04-18 | Disposition: A | Discharge status: Home / Self Care | Payer: Medicare Other

## 2019-05-19 ENCOUNTER — Emergency Department (HOSPITAL_COMMUNITY)
Admission: EM | Admit: 2019-05-19 | Discharge: 2019-05-20 | Disposition: A | Discharge status: Other Acute Inpatient Hospital | Payer: Medicare Other | Source: Home / Self Care | Attending: Emergency Medicine | Admitting: Emergency Medicine

## 2019-05-19 ENCOUNTER — Other Ambulatory Visit: Payer: Self-pay

## 2019-05-19 ENCOUNTER — Telehealth (HOSPITAL_COMMUNITY): Payer: Self-pay | Admitting: Psychiatry

## 2019-05-19 DIAGNOSIS — E039 Hypothyroidism, unspecified: Secondary | ICD-10-CM | POA: Insufficient documentation

## 2019-05-19 DIAGNOSIS — Z79899 Other long term (current) drug therapy: Secondary | ICD-10-CM | POA: Insufficient documentation

## 2019-05-19 DIAGNOSIS — F319 Bipolar disorder, unspecified: Secondary | ICD-10-CM | POA: Diagnosis not present

## 2019-05-19 DIAGNOSIS — T50902A Poisoning by unspecified drugs, medicaments and biological substances, intentional self-harm, initial encounter: Secondary | ICD-10-CM | POA: Insufficient documentation

## 2019-05-19 DIAGNOSIS — E1122 Type 2 diabetes mellitus with diabetic chronic kidney disease: Secondary | ICD-10-CM | POA: Insufficient documentation

## 2019-05-19 DIAGNOSIS — Z87891 Personal history of nicotine dependence: Secondary | ICD-10-CM | POA: Insufficient documentation

## 2019-05-19 DIAGNOSIS — N183 Chronic kidney disease, stage 3 (moderate): Secondary | ICD-10-CM | POA: Insufficient documentation

## 2019-05-19 DIAGNOSIS — Z20828 Contact with and (suspected) exposure to other viral communicable diseases: Secondary | ICD-10-CM | POA: Insufficient documentation

## 2019-05-19 DIAGNOSIS — F329 Major depressive disorder, single episode, unspecified: Secondary | ICD-10-CM | POA: Insufficient documentation

## 2019-05-19 LAB — ETHANOL: Alcohol, Ethyl (B): <10 mg/dL (ref <10)

## 2019-05-19 LAB — SALICYLATE LEVEL: Salicylate Lvl: <7 mg/dL (ref 2.8–30.0)

## 2019-05-19 LAB — CBC
HCT: 37.1 % (ref 36.0–46.0)
Hemoglobin: 12.4 g/dL (ref 12.0–15.0)
MCH: 30.3 pg (ref 26.0–34.0)
MCHC: 33.4 g/dL (ref 30.0–36.0)
MCV: 90.7 fL (ref 80.0–100.0)
Platelets: 172 10*3/uL (ref 150–400)
RBC: 4.09 MIL/uL (ref 3.87–5.11)
RDW: 12.3 % (ref 11.5–15.5)
WBC: 5 10*3/uL (ref 4.0–10.5)
nRBC: 0 % (ref 0.0–0.2)

## 2019-05-19 LAB — COMPREHENSIVE METABOLIC PANEL
ALT: 30 U/L (ref 0–44)
AST: 31 U/L (ref 15–41)
Albumin: 3.7 g/dL (ref 3.5–5.0)
Alkaline Phosphatase: 52 U/L (ref 38–126)
Anion gap: 9 (ref 5–15)
BUN: 23 mg/dL — ABNORMAL HIGH (ref 6–20)
CO2: 21 mmol/L — ABNORMAL LOW (ref 22–32)
Calcium: 8.7 mg/dL — ABNORMAL LOW (ref 8.9–10.3)
Chloride: 111 mmol/L (ref 98–111)
Creatinine, Ser: 1.19 mg/dL — ABNORMAL HIGH (ref 0.44–1.00)
GFR calc Af Amer: >60 mL/min (ref >60)
GFR calc non Af Amer: 52 mL/min — ABNORMAL LOW (ref >60)
Glucose, Bld: 120 mg/dL — ABNORMAL HIGH (ref 70–99)
Potassium: 3.8 mmol/L (ref 3.5–5.1)
Sodium: 141 mmol/L (ref 135–145)
Total Bilirubin: 0.6 mg/dL (ref 0.3–1.2)
Total Protein: 6.1 g/dL — ABNORMAL LOW (ref 6.5–8.1)

## 2019-05-19 LAB — RAPID URINE DRUG SCREEN, HOSP PERFORMED
Amphetamines: NOT DETECTED
Barbiturates: POSITIVE — AB
Benzodiazepines: POSITIVE — AB
Cocaine: NOT DETECTED
Opiates: NOT DETECTED
Tetrahydrocannabinol: NOT DETECTED

## 2019-05-19 LAB — CBG MONITORING, ED: Glucose-Capillary: 91 mg/dL (ref 70–99)

## 2019-05-19 LAB — I-STAT BETA HCG BLOOD, ED (MC, WL, AP ONLY): I-stat hCG, quantitative: <5 m[IU]/mL (ref <5)

## 2019-05-19 LAB — SARS CORONAVIRUS 2 BY RT PCR (HOSPITAL ORDER, PERFORMED IN ~~LOC~~ HOSPITAL LAB): SARS Coronavirus 2: NEGATIVE

## 2019-05-19 LAB — ACETAMINOPHEN LEVEL: Acetaminophen (Tylenol), Serum: <10 ug/mL — ABNORMAL LOW (ref 10–30)

## 2019-05-19 MED ORDER — SODIUM CHLORIDE 0.9 % IV BOLUS
1000.0000 mL | Freq: Once | INTRAVENOUS | Status: AC
  Administered 2019-05-19: 1000 mL via INTRAVENOUS

## 2019-05-19 NOTE — ED Notes (Signed)
Nurse Tiffany H witness to I&O

## 2019-05-19 NOTE — ED Provider Notes (Signed)
[facility]   CSN: [id number] Arrival date & time: 05/19/19  1454     History   Chief Complaint Chief Complaint  Patient presents with   Drug Overdose   Level 5 caveat: Altered mental status  HPI Juan QuamMichele F Zeiss is a 53 y.o. female.     HPI 53 year old female with a history of longstanding depression.  Apparently she was in her normal state of health today although dealing with increased stress and depression.  She had contacted her behavioral health/psychiatric team and was unable to get an appointment.  The husband contacted the patient around approximately 1:30 PM and got no response and thus he returned home and found the patient in the house although somewhat sleepy.  He saw that there were some empty pills and she had stated that she had taken both her benzodiazepine and her Saphris.  He has the majority of her medications locked up but does place out PRN benzodiazepine (Klonopin) daily for as needed use.  The Saphris is usually kept locked up.  Patient apparently admitted to him that she sometimes stashes the pills.  In route to the hospital the patient became increasingly lethargic and thus the patient came to the emergency department instead of the behavioral health department.  On arrival to the emergency department she is able to open her eyes to painful stimuli and is able to squeeze both hands when stimulated with painful stimuli.  There is no suicide note.  The husband is unclear if this was an intentional overdose.  He states sometimes she will take extra of her pills to "sleep".   Past Medical History:  Diagnosis Date   Acid reflux    Anemia    Anxiety    Bipolar disorder (HCC)    Chronic kidney disease    STAGE 3   Depression    Diabetes mellitus    NIDD   Essential tremor    Family history of ovarian cancer    Pt doesn't meet Medicare genetic testing guidelines   Hyperlipidemia    Hypothyroid     IBS (irritable bowel syndrome)    Migraine headache    vestibular migraine   Obesity    Osteopenia    Restless leg syndrome    Sarcoidosis    Sleep apnea    Syncope \    Patient Active Problem List   Diagnosis Date Noted   Vaginal atrophy 07/12/2017   Polypharmacy 08/14/2016   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   Hypercalcemia 07/24/2016   Sarcoidosis 07/24/2016   Hyperlipidemia, unspecified 11/15/2015   Type 2 diabetes mellitus without complication, without long-term current use of insulin (HCC) 11/15/2015   Anxiety disorder 07/29/2015   Anxiety 05/12/2015   Bipolar depression (HCC) 05/12/2015   Essential (primary) hypertension 05/12/2015   Benign essential tremor 05/12/2015   Gastro-esophageal reflux disease without esophagitis 05/12/2015   Cephalalgia 05/12/2015   Hypersomnia with sleep apnea 05/12/2015   Restless leg 05/12/2015   Diabetes (HCC) 05/12/2015   Hypothyroidism 05/12/2015   Migraine variant 05/12/2015   Bipolar disorder (HCC) 05/12/2015   Type 2 diabetes mellitus without complications (HCC) 05/12/2015   Chronic migraine without aura 09/03/2012   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   Syncope and collapse 03/17/2012   Depression 03/17/2012   Migraine 03/17/2012    Past Surgical History:  Procedure Laterality Date   ABDOMINAL HYSTERECTOMY     APPENDECTOMY     BLADDER SURGERY     bladder tact  CATARACT EXTRACTION W/PHACO Right 09/21/2018   Procedure: CATARACT EXTRACTION PHACO AND INTRAOCULAR LENS PLACEMENT ([place]);  Surgeon: [name], MD;  Location: [facility] ORS;  Service: Ophthalmology;  Laterality: Right;  [place]  00:20 CDE 00.82 Fluid pack lot # [id number]   CATARACT EXTRACTION W/PHACO Left 10/17/2018   Procedure: CATARACT EXTRACTION PHACO AND INTRAOCULAR LENS PLACEMENT ([place])  LEFT;  Surgeon: [name], MD;  Location: [place];  Service: Ophthalmology;  Laterality: Left;   diabetic - diet controlled   CESAREAN SECTION     CHOLECYSTECTOMY     COLONOSCOPY WITH PROPOFOL N/A 04/05/2017   Procedure: COLONOSCOPY WITH PROPOFOL;  Surgeon: [name], MD;  Location: [facility] ENDOSCOPY;  Service: Endoscopy;  Laterality: N/A;   ESOPHAGOGASTRODUODENOSCOPY (EGD) WITH PROPOFOL N/A 08/28/2016   Procedure: ESOPHAGOGASTRODUODENOSCOPY (EGD) WITH PROPOFOL;  Surgeon: [name], MD;  Location: [facility] ENDOSCOPY;  Service: Endoscopy;  Laterality: N/A;   HERNIA REPAIR     INGUINAL HERNIA REPAIR     rt and left   lexiscan cardiolite     TILT TABLE STUDY N/A 04/01/2012   Procedure: TILT TABLE STUDY;  Surgeon: Deboraha Sprang, MD;  Location: [facility] CATH LAB;  Service: Cardiovascular;  Laterality: N/A;     OB History    Gravida  5   Para  4   Term  4   Preterm      AB  1   Living  4     SAB      TAB  1   Ectopic      Multiple      Live Births               Home Medications    Prior to Admission medications   Medication Sig Start Date End Date Taking? Authorizing Provider  Armodafinil 250 MG tablet Take 250 mg by mouth daily.    [provider]  atorvastatin (LIPITOR) 40 MG tablet Take 40 mg by mouth daily.     [provider]  benztropine (COGENTIN) 1 MG tablet Take 1 mg by mouth 2 (two) times daily.     [provider]  buPROPion (WELLBUTRIN XL) 300 MG 24 hr tablet Take 300 mg by mouth daily.    [provider]  diazepam (VALIUM) 2 MG tablet Take 2 mg by mouth every 6 (six) hours as needed for anxiety. 2 mg breakfast and lunch, 4 mg bedtime    [provider]  docusate sodium (COLACE) 100 MG capsule Take 100 mg by mouth 2 (two) times daily.     [provider]  enalapril (VASOTEC) 2.5 MG tablet Take 2.5 mg by mouth daily.    [provider]  escitalopram (LEXAPRO) 20 MG tablet Take 20 mg by mouth daily.    [provider]  fish oil-omega-3 fatty acids 1000 MG capsule Take 1  g by mouth daily.     [provider]  furosemide (LASIX) 20 MG tablet Take 20 mg by mouth daily.    [provider]  glucose blood (ONE TOUCH ULTRA TEST) test strip TEST TWICE A DAY AS DIRECTED 07/15/16   [provider]  levETIRAcetam (KEPPRA) 250 MG tablet Take 500 mg by mouth 2 (two) times daily.    [provider]  levothyroxine (SYNTHROID, LEVOTHROID) 100 MCG tablet Take 100 mcg by mouth daily before breakfast. Takes at bedtime    [provider]  liothyronine (CYTOMEL) 5 MCG tablet Take 5 mcg by mouth  2 (two) times daily.  05/11/16   [provider]  methocarbamol (ROBAXIN) 750 MG tablet Take 750 mg by mouth as needed for muscle spasms.    [provider]  methylPREDNISolone (MEDROL DOSEPAK) 4 MG TBPK tablet As per pack 02/19/19   Arby BarrettePfeiffer, Marcy, MD  Multiple Vitamin (MULITIVITAMIN WITH MINERALS) TABS Take 1 tablet by mouth daily.    [provider]  Nutritional Supplements (MELATONIN PO) Take 10 mg by mouth at bedtime.     [provider]  Dola ArgyleNETOUCH DELICA LANCETS FINE MISC USE AS DIRECTED 07/15/16   [provider]  orphenadrine (NORFLEX) 100 MG tablet Take 1 tablet (100 mg total) by mouth 2 (two) times daily. 02/19/19   Arby BarrettePfeiffer, Marcy, MD  Ospemifene (OSPHENA) 60 MG TABS Take 1 tablet by mouth daily. 08/10/18   Nadara MustardHarris, Robert P, MD  pantoprazole (PROTONIX) 40 MG tablet Take 40 mg by mouth daily.    [provider]  primidone (MYSOLINE) 50 MG tablet Take 50-100 mg by mouth See admin instructions. 50 mg twice daily(breakfast and lunch) , 100 mg at bedtime    [provider]  pyridOXINE (VITAMIN B-6) 100 MG tablet Take 100 mg by mouth daily.    [provider]  topiramate (TOPAMAX) 200 MG tablet Take 300-400 mg by mouth See admin instructions. pt take 2 tabs (400 mg) q am, and 1.5 (300 mg) hs    [provider]  traMADol (ULTRAM) 50 MG tablet 2 tablets every 6 hours as  needed for pain. 02/19/19   Arby BarrettePfeiffer, Marcy, MD  verapamil (CALAN) 40 MG tablet Take 40 mg by mouth at bedtime.    [provider]    Family History Family History  Problem Relation Age of Onset   Colon cancer Father    Stomach cancer Father    Hypertension Mother    Hyperlipidemia Mother    Alcohol abuse Brother    Irritable bowel syndrome Sister    Breast cancer Paternal Aunt    Ovarian cancer Paternal Aunt    Kidney cancer Neg Hx    Kidney disease Neg Hx    Prostate cancer Neg Hx     Social History Social History   Tobacco Use   Smoking status: Former Smoker    Types: Cigarettes    Quit date: 2015    Years since quitting: 5.5   Smokeless tobacco: Never Used  Substance Use Topics   Alcohol use: No   Drug use: No     Allergies   Cleocin [clindamycin hcl]   Review of Systems Review of Systems  Unable to perform ROS: Mental status change     Physical Exam Updated Vital Signs BP 101/61    Pulse 83    Temp 98.2 F (36.8 C) (Axillary)    Resp 16    SpO2 99%   Physical Exam Vitals signs and nursing note reviewed.  Constitutional:      General: She is not in acute distress.    Appearance: She is well-developed.  HENT:     Head: Normocephalic and atraumatic.     Right Ear: Tympanic membrane normal.     Left Ear: Tympanic membrane normal.  Neck:     Musculoskeletal: Normal range of motion and neck supple.  Cardiovascular:     Rate and Rhythm: Normal rate and regular rhythm.     Heart sounds: Normal heart sounds.  Pulmonary:     Effort: Pulmonary effort is normal.     Breath sounds:  Normal breath sounds.  Abdominal:     General: There is no distension.     Palpations: Abdomen is soft.     Tenderness: There is no abdominal tenderness.  Musculoskeletal: Normal range of motion.  Skin:    General: Skin is warm and dry.  Neurological:     Mental Status: She is alert.     Comments: Opens eyes to painful stimuli.  Non-comprehensible  words.  Squeezes both hands bilaterally with painful stimuli being applied during commands  Psychiatric:        Judgment: Judgment normal.      ED Treatments / Results  Labs (all labs ordered are listed, but only abnormal results are displayed) Labs Reviewed  COMPREHENSIVE METABOLIC PANEL - Abnormal; Notable for the following components:      Result Value   CO2 21 (*)    Glucose, Bld 120 (*)    BUN 23 (*)    Creatinine, Ser 1.19 (*)    Calcium 8.7 (*)    Total Protein 6.1 (*)    GFR calc non Af Amer 52 (*)    All other components within normal limits  ACETAMINOPHEN LEVEL - Abnormal; Notable for the following components:   Acetaminophen (Tylenol), Serum <10 (*)    All other components within normal limits  SARS CORONAVIRUS 2  ETHANOL  SALICYLATE LEVEL  CBC  RAPID URINE DRUG SCREEN, HOSP PERFORMED  I-STAT BETA HCG BLOOD, ED ([facility], [facility], [facility] ONLY)  CBG MONITORING, ED    EKG EKG Interpretation  Date/Time:  Friday May 19 2019 15:00:21 EDT Ventricular Rate:  83 PR Interval:    QRS Duration: 101 QT Interval:  420 QTC Calculation: 494 R Axis:   -83 Text Interpretation:  Sinus rhythm LAD, consider left anterior fascicular block Low voltage, precordial leads Right ventricular hypertrophy Borderline prolonged QT interval No significant change was found Confirmed by Azalia Bilis ([id number]) on 05/19/2019 3:46:35 PM   Radiology No results found.  Procedures .Critical Care Performed by: Azalia Bilis, MD Authorized by: Azalia Bilis, MD   Critical care provider statement:    Critical care time (minutes):  35   Critical care was time spent personally by me on the following activities:  Discussions with consultants, evaluation of patient's response to treatment, examination of patient, ordering and performing treatments and interventions, ordering and review of laboratory studies, ordering and review of radiographic studies, pulse oximetry, re-evaluation of patient's condition,  obtaining history from patient or surrogate and review of old charts   (including critical care time)  Medications Ordered in ED Medications  sodium chloride 0.9 % bolus 1,000 mL (has no administration in time range)     Initial Impression / Assessment and Plan / ED Course  I have reviewed the triage vital signs and the nursing notes.  Pertinent labs & imaging results that were available during my care of the patient were reviewed by me and considered in my medical decision making (see chart for details).       Potentially intentional overdose.  Labs will be ordered.  Placed on the monitor.  Will need to monitor the patient closely while in the emergency department  Several checks thus far result similar mental status   4:26 PM Patient opens eyes to pain.  Follows simple commands.  Squeezes hands bilaterally.   Patient's mental status is slowly improving  After approximately 8 hours of observation in the emergency department she is alert and sitting up.  She is drinking fluids.  She feels  much better.  She seems much better.  She is unable to clearly tell me if this was an intentional suicide attempt.  She does report taking a "handful" of her Klonopin.  She also reports taking 5-8 of her 10 mg Saphris pills.  She does not know the number of benzodiazepine she took.  She reports that she had been keeping a pile of her pills for such a time as today. Does admit to increased depression lately  Medically clear at this time TTS to evaluate the patient  Patient accepted to behavioral health at this time. Willing to go voluntary at this time  IVC paperwork will need to be filled out if she desires to leave prior to psychiatric clearance. pts husband updated   Final Clinical Impressions(s) / ED Diagnoses   Final diagnoses:  None    ED Discharge Orders    None       Azalia Bilisampos, Tennyson Kallen, MD 05/20/19 2257

## 2019-05-19 NOTE — ED Notes (Signed)
RN spoke with pt and husband about wait

## 2019-05-19 NOTE — ED Triage Notes (Signed)
Patient presents to the ED by EMS with overdose of clonazepam. Report: Pt has been dealing with family issues and wanted to go to [facility]. Her husband was taking her when she became lethargic, it is unknown how many pills she took and the bottle is not present. Hx of anxiety and depression. VSS, normal saline administered in route. Pupils 4+ and reactive, responding to painful stimuli.

## 2019-05-19 NOTE — Telephone Encounter (Signed)
D:  Pt's husband Secretary/administrator) called requesting MH-IOP again for his wife.  A:  Placed call to pt to re-orient her and provide her with a start date.  Due to the symptoms the patient was expressing, this writer recommended a higher level of care (possibly PHP).  Pt stated she was sorry she wasted my time and hung up the phone.  Informed PHP that pt's husband may f/u with them.  Informed [name], NP.

## 2019-05-19 NOTE — BH Assessment (Signed)
Tele Assessment Note   Patient Name: Jennifer Bright MRN: [id number] Referring Physician: Azalia BilisKevin Campos, MD [facility], [phone] Location of Provider: Behavioral Health TTS Department  Jennifer Bright is an 53 y.o. married female who presents to [facility] accompanied by her husband, Jennifer Bright, who participated in assessment at Pt's request. Assessment was conducted via telephone due to tele-cart malfunction. Pt reports she has a history of depressive symptoms and auditory hallucinations. She says she has felt severely depressed recently. She says today "I had enough" and ingested an unknown quantity of unknown medication. When asked if she intended to kill herself, Pt replies, "I would rather not answer that." Pt states she has attempted suicide in the past by overdose. Pt's husband reports that he was talking with Pt and she became increasingly lethargic and he doesn't know what medication she took. [facility]. Pt says "until ten minutes ago I didn't know I was in an emergency department."  Pt says she has felt severely depressed and acknowledges symptoms including crying spells, social withdrawal, loss of interest in usual pleasures, fatigue, irritability, decreased sleep, decreased appetite and feelings of worthlessness and hopelessness. She reports she has experienced auditory of hallucinations for 14 years, which she describes as whispers. She says recently these voices have increased in intensity but not frequency. She says she has experienced visual hallucinations in the past but not recently. She denies any recent manic symptoms. She denies any history of intentional self-injurious behaviors. She denies any history of alcohol or substance use.  Pt identifies conflicts with her family as her primary stressor. She says she has four children and seven grandchildren. She says she has helped them financially and been generous with the  grandchildren but they do not want to be around her and are ungrateful. Pt says she "can't depend" on her family. Pt lives with her husband and their pets. She denies any history of abuse. She denies legal problems. She denies access to firearms.  Pt is currently receiving outpatient medication management through Dr. Milagros Evenerupinder Bright. Pt's husband reports Pt was started on Geodon approximately one month ago. Pt reports she takes her medications as prescribed. She states she [facility] to start an intensive outpatient program. Pt's medical record indicates partial hospitalization program was recommended but Pt could not start until next week. Pt's husband reports Pt received inpatient psychiatric treatment twice many years ago at [facility] and once at [facility]. Pt says the experience at [facility] was very bad both times and she no longer is open to inpatient psychiatric treatment.  Pt spoke with slurred speech. Her mood is depressed. Pt's thought process is coherent and goal directed. Pt says she cannot remember what happened earlier today. Pt and Pt's husband state Pt is not interested inpatient psychiatric treatment and want to pursue outpatient treatment.   Diagnosis: F31.5 Bipolar I disorder, Current or most recent episode depressed, With psychotic features   Past Medical History:  Past Medical History:  Diagnosis Date  . Acid reflux   . Anemia   . Anxiety   . Bipolar disorder (HCC)   . Chronic kidney disease    STAGE 3  . Depression   . Diabetes mellitus    NIDD  . Essential tremor   . Family history of ovarian cancer    Pt doesn't meet Medicare genetic testing guidelines  . Hyperlipidemia   . Hypothyroid   . IBS (irritable  bowel syndrome)   . Migraine headache    vestibular migraine  . Obesity   . Osteopenia   . Restless leg syndrome   . Sarcoidosis   . Sleep apnea   . Syncope \    Past Surgical History:  Procedure Laterality Date  .  ABDOMINAL HYSTERECTOMY    . APPENDECTOMY    . BLADDER SURGERY     bladder tact  . CATARACT EXTRACTION W/PHACO Right 09/21/2018   Procedure: CATARACT EXTRACTION PHACO AND INTRAOCULAR LENS PLACEMENT (IOC);  Surgeon: Nevada CraneKing, Bradley Mark, MD;  Location: [facility] ORS;  Service: Ophthalmology;  Laterality: Right;  US  00:20 CDE 00.82 Fluid pack lot # [id number]  . CATARACT EXTRACTION W/PHACO Left 10/17/2018   Procedure: CATARACT EXTRACTION PHACO AND INTRAOCULAR LENS PLACEMENT (IOC)  LEFT;  Surgeon: Nevada CraneKing, Bradley Mark, MD;  [facility];  Service: Ophthalmology;  Laterality: Left;  diabetic - diet controlled  . CESAREAN SECTION    . CHOLECYSTECTOMY    . COLONOSCOPY WITH PROPOFOL N/A 04/05/2017   Procedure: COLONOSCOPY WITH PROPOFOL;  Surgeon: Christena DeemSkulskie, Martin U, MD;  [facility]COPY;  Service: Endoscopy;  Laterality: N/A;  . ESOPHAGOGASTRODUODENOSCOPY (EGD) WITH PROPOFOL N/A 08/28/2016   Procedure: ESOPHAGOGASTRODUODENOSCOPY (EGD) WITH PROPOFOL;  Surgeon: [name], MD;  [facility];  Service: Endoscopy;  Laterality: N/A;  . HERNIA REPAIR    . INGUINAL HERNIA REPAIR     rt and left  . lexiscan cardiolite    . TILT TABLE STUDY N/A 04/01/2012   Procedure: TILT TABLE STUDY;  Surgeon: [name], MD;  [facility];  Service: Cardiovascular;  Laterality: N/A;    Family History:  Family History  Problem Relation Age of Onset  . Colon cancer Father   . Stomach cancer Father   . Hypertension Mother   . Hyperlipidemia Mother   . Alcohol abuse Brother   . Irritable bowel syndrome Sister   . Breast cancer Paternal Aunt   . Ovarian cancer Paternal Aunt   . Kidney cancer Neg Hx   . Kidney disease Neg Hx   . Prostate cancer Neg Hx     Social History:  reports that she quit smoking about 5 years ago. Her smoking use included cigarettes. She has never used smokeless tobacco. She reports that she does not drink alcohol or use drugs.  Additional  Social History:  Alcohol / Drug Use Pain Medications: Denies abuse Prescriptions: Denies abuse Over the Counter: Denies abuse History of alcohol / drug use?: No history of alcohol / drug abuse Longest period of sobriety (when/how long): NA  CIWA: CIWA-Ar BP: 101/69 Pulse Rate: 78 COWS:    Allergies:  Allergies  Allergen Reactions  . Cleocin [Clindamycin Hcl] Other (See Comments)    GI distress    Home Medications: (Not in a hospital admission)   OB/GYN Status:  No LMP recorded. Patient has had a hysterectomy.  General Assessment Data [facility] TTS Assessment: In system Is this a Tele or Face-to-Face Assessment?: Tele Assessment Is this an Initial Assessment or a Re-assessment for this encounter?: Initial Assessment Patient Accompanied by:: Other(Husband) Language Other than English: No Living Arrangements: Other (Comment)(Lives with husband) What gender do you identify as?: Female Marital status: Married HopwoodMaiden name: NA Pregnancy Status: No Living Arrangements: Spouse/significant other Can pt return to current living arrangement?: Yes Admission Status: Other (Comment)(Pt will be petitioned by EDP for IVC) Is patient capable of signing voluntary admission?: Yes Referral Source: Self/Family/Friend  Insurance type: [facility] Medicare     Crisis Care Plan Living Arrangements: Spouse/significant other Legal Guardian: Other:(Self) Name of Psychiatrist: Chucky May, MD Name of Therapist: None  Education Status Is patient currently in school?: No Is the patient employed, unemployed or receiving disability?: Receiving disability income  Risk to self with the past 6 months Suicidal Ideation: Yes-Currently Present Has patient been a risk to self within the past 6 months prior to admission? : Yes Suicidal Intent: Yes-Currently Present Has patient had any suicidal intent within the past 6 months prior to admission? : Yes Is patient at risk for suicide?:  Yes Suicidal Plan?: Yes-Currently Present Has patient had any suicidal plan within the past 6 months prior to admission? : Yes Specify Current Suicidal Plan: Pt ingested an unknown quantity of unknown medication Access to Means: Yes Specify Access to Suicidal Means: Access to medications What has been your use of drugs/alcohol within the last 12 months?: Pt denies Previous Attempts/Gestures: Yes How many times?: 1(History of suicide attempt by OD) Other Self Harm Risks: None Triggers for Past Attempts: None known Intentional Self Injurious Behavior: None Family Suicide History: No Recent stressful life event(s): Conflict (Comment)(Conflict with family) Persecutory voices/beliefs?: No Depression: Yes Depression Symptoms: Despondent, Insomnia, Tearfulness, Fatigue, Isolating, Guilt, Loss of interest in usual pleasures, Feeling worthless/self pity, Feeling angry/irritable Substance abuse history and/or treatment for substance abuse?: No Suicide prevention information given to non-admitted patients: Not applicable  Risk to Others within the past 6 months Homicidal Ideation: No Does patient have any lifetime risk of violence toward others beyond the six months prior to admission? : No Thoughts of Harm to Others: No Current Homicidal Intent: No Current Homicidal Plan: No Access to Homicidal Means: No Identified Victim: None History of harm to others?: No Assessment of Violence: None Noted Violent Behavior Description: None Does patient have access to weapons?: No Criminal Charges Pending?: No Does patient have a court date: No Is patient on probation?: No  Psychosis Hallucinations: Auditory Delusions: None noted  Mental Status Report Appearance/Hygiene: Unable to Assess Eye Contact: Unable to Assess Motor Activity: Unable to assess Speech: Slurred Level of Consciousness: Quiet/awake Mood: Depressed Affect: Depressed Anxiety Level: Minimal Thought Processes: Coherent,  Relevant Judgement: Partial Orientation: Person, Place, Time, Situation, Appropriate for developmental age Obsessive Compulsive Thoughts/Behaviors: None  Cognitive Functioning Concentration: Decreased Memory: Recent Intact, Remote Intact Is patient IDD: No Insight: Fair Impulse Control: Fair Appetite: Poor Have you had any weight changes? : No Change Sleep: Decreased Total Hours of Sleep: 5 Vegetative Symptoms: None  ADLScreening [facility] Assessment Services) Patient's cognitive ability adequate to safely complete daily activities?: Yes Patient able to express need for assistance with ADLs?: Yes Independently performs ADLs?: Yes (appropriate for developmental age)  Prior Inpatient Therapy Prior Inpatient Therapy: Yes Prior Therapy Dates: "Many years ago" Prior Therapy Facilty/Provider(s): [facility] Reason for Treatment: Depression  Prior Outpatient Therapy Prior Outpatient Therapy: Yes Prior Therapy Dates: Current Prior Therapy Facilty/Provider(s): [facility] Reason for Treatment: Depression Does patient have an ACCT team?: No Does patient have Intensive In-House Services?  : No Does patient have [facility] services? : No Does patient have P4CC services?: No  ADL Screening (condition at time of admission) Patient's cognitive ability adequate to safely complete daily activities?: Yes Is the patient deaf or have difficulty hearing?: No Does the patient have difficulty seeing, even when wearing glasses/contacts?: No Does the patient have difficulty concentrating, remembering, or making decisions?: No Patient able to express need for assistance with ADLs?: Yes  Does the patient have difficulty dressing or bathing?: No Independently performs ADLs?: Yes (appropriate for developmental age) Does the patient have difficulty walking or climbing stairs?: No Weakness of Legs: None Weakness of Arms/Hands: None  Home Assistive Devices/Equipment Home Assistive  Devices/Equipment: None    Abuse/Neglect Assessment (Assessment to be complete while patient is alone) Abuse/Neglect Assessment Can Be Completed: Yes Physical Abuse: Denies Verbal Abuse: Denies Sexual Abuse: Denies Exploitation of patient/patient's resources: Denies Self-Neglect: Denies     Merchant navy officerAdvance Directives (For Healthcare) Does Patient Have a Medical Advance Directive?: No Would patient like information on creating a medical advance directive?: No - Patient declined          Disposition: Binnie RailJoAnn Glover, [facility], confirmed a bed will be available in the morning. Gave clinical report to Donell SievertSpencer Simon, PA who said Pt meets criteria for inpatient psychiatric treatment. Notified Dr. Nash MantisKevon Bright who said he would petition for IVC if Pt is not willing to sign voluntary consent for treatment. Notified Kermit BaloMichael Peters, RN of [facility] in AM.  Disposition Initial Assessment Completed for this Encounter: Yes  This service was provided via telemedicine using a 2-way, interactive audio and video technology.  Names of all persons participating in this telemedicine service and their role in this encounter. Name: Jennifer LeydenMichele Engebretson Role: Patient  Name: Jennifer Bayandall Salazar Role: Pt's husband  Name: Shela CommonsFord Hobart Marte Jr, [facility] Role: TTS counselor      Harlin RainFord Ellis Patsy BaltimoreWarrick Jr, [facility], [facility], [facility] Triage Specialist [phone]  Pamalee LeydenWarrick Jr, Ghada Abbett Ellis 05/19/2019 11:44 PM

## 2019-05-19 NOTE — ED Notes (Signed)
istat hcg resulted at [employer]

## 2019-05-20 ENCOUNTER — Inpatient Hospital Stay (HOSPITAL_COMMUNITY)
Admission: AD | Admit: 2019-05-20 | Discharge: 2019-05-22 | DRG: 885 | Disposition: A | Discharge status: Home / Self Care | Payer: Medicare Other | Source: Intra-hospital | Attending: Psychiatry | Admitting: Psychiatry

## 2019-05-20 ENCOUNTER — Other Ambulatory Visit: Payer: Self-pay

## 2019-05-20 ENCOUNTER — Encounter (HOSPITAL_COMMUNITY): Payer: Self-pay | Admitting: *Deleted

## 2019-05-20 DIAGNOSIS — G4730 Sleep apnea, unspecified: Secondary | ICD-10-CM | POA: Diagnosis present

## 2019-05-20 DIAGNOSIS — F319 Bipolar disorder, unspecified: Secondary | ICD-10-CM | POA: Diagnosis present

## 2019-05-20 DIAGNOSIS — E785 Hyperlipidemia, unspecified: Secondary | ICD-10-CM | POA: Diagnosis present

## 2019-05-20 DIAGNOSIS — E039 Hypothyroidism, unspecified: Secondary | ICD-10-CM | POA: Diagnosis present

## 2019-05-20 DIAGNOSIS — Z79890 Hormone replacement therapy (postmenopausal): Secondary | ICD-10-CM | POA: Diagnosis not present

## 2019-05-20 DIAGNOSIS — E669 Obesity, unspecified: Secondary | ICD-10-CM | POA: Diagnosis present

## 2019-05-20 DIAGNOSIS — K219 Gastro-esophageal reflux disease without esophagitis: Secondary | ICD-10-CM | POA: Diagnosis present

## 2019-05-20 DIAGNOSIS — Z20828 Contact with and (suspected) exposure to other viral communicable diseases: Secondary | ICD-10-CM | POA: Diagnosis present

## 2019-05-20 DIAGNOSIS — Z90710 Acquired absence of both cervix and uterus: Secondary | ICD-10-CM | POA: Diagnosis not present

## 2019-05-20 DIAGNOSIS — G2581 Restless legs syndrome: Secondary | ICD-10-CM | POA: Diagnosis present

## 2019-05-20 DIAGNOSIS — Z87891 Personal history of nicotine dependence: Secondary | ICD-10-CM | POA: Diagnosis not present

## 2019-05-20 DIAGNOSIS — Z6827 Body mass index (BMI) 27.0-27.9, adult: Secondary | ICD-10-CM

## 2019-05-20 DIAGNOSIS — K5900 Constipation, unspecified: Secondary | ICD-10-CM | POA: Diagnosis present

## 2019-05-20 DIAGNOSIS — M8580 Other specified disorders of bone density and structure, unspecified site: Secondary | ICD-10-CM | POA: Diagnosis present

## 2019-05-20 DIAGNOSIS — G4700 Insomnia, unspecified: Secondary | ICD-10-CM | POA: Diagnosis present

## 2019-05-20 DIAGNOSIS — Z888 Allergy status to other drugs, medicaments and biological substances status: Secondary | ICD-10-CM

## 2019-05-20 DIAGNOSIS — N183 Chronic kidney disease, stage 3 (moderate): Secondary | ICD-10-CM | POA: Diagnosis present

## 2019-05-20 DIAGNOSIS — F419 Anxiety disorder, unspecified: Secondary | ICD-10-CM | POA: Diagnosis present

## 2019-05-20 DIAGNOSIS — Z79899 Other long term (current) drug therapy: Secondary | ICD-10-CM

## 2019-05-20 DIAGNOSIS — E1122 Type 2 diabetes mellitus with diabetic chronic kidney disease: Secondary | ICD-10-CM | POA: Diagnosis present

## 2019-05-20 DIAGNOSIS — Z9049 Acquired absence of other specified parts of digestive tract: Secondary | ICD-10-CM | POA: Diagnosis not present

## 2019-05-20 DIAGNOSIS — G43909 Migraine, unspecified, not intractable, without status migrainosus: Secondary | ICD-10-CM | POA: Diagnosis present

## 2019-05-20 DIAGNOSIS — E873 Alkalosis: Secondary | ICD-10-CM | POA: Diagnosis present

## 2019-05-20 DIAGNOSIS — I444 Left anterior fascicular block: Secondary | ICD-10-CM | POA: Diagnosis present

## 2019-05-20 DIAGNOSIS — R45851 Suicidal ideations: Secondary | ICD-10-CM | POA: Diagnosis present

## 2019-05-20 DIAGNOSIS — F310 Bipolar disorder, current episode hypomanic: Secondary | ICD-10-CM | POA: Diagnosis not present

## 2019-05-20 MED ORDER — ENALAPRIL MALEATE 2.5 MG PO TABS
2.5000 mg | ORAL_TABLET | Freq: Every day | ORAL | Status: DC
  Administered 2019-05-20 – 2019-05-22 (×3): 2.5 mg via ORAL
  Filled 2019-05-20 (×5): qty 1

## 2019-05-20 MED ORDER — HYDROXYZINE HCL 25 MG PO TABS: 25.0000 mg | ORAL_TABLET | Freq: Three times a day (TID) | ORAL | Status: DC | PRN

## 2019-05-20 MED ORDER — TOPIRAMATE 100 MG PO TABS
400.0000 mg | ORAL_TABLET | Freq: Every day | ORAL | Status: DC
  Administered 2019-05-20 – 2019-05-21 (×2): 400 mg via ORAL
  Filled 2019-05-20 (×4): qty 4

## 2019-05-20 MED ORDER — ZIPRASIDONE HCL 60 MG PO CAPS
180.0000 mg | ORAL_CAPSULE | Freq: Every day | ORAL | Status: DC
  Administered 2019-05-20 – 2019-05-21 (×2): 180 mg via ORAL
  Filled 2019-05-20 (×4): qty 3

## 2019-05-20 MED ORDER — ACETAMINOPHEN 325 MG PO TABS: 650.0000 mg | ORAL_TABLET | Freq: Four times a day (QID) | ORAL | Status: DC | PRN

## 2019-05-20 MED ORDER — TRAZODONE HCL 50 MG PO TABS
50.0000 mg | ORAL_TABLET | Freq: Every evening | ORAL | Status: DC | PRN
  Administered 2019-05-20: 50 mg via ORAL
  Filled 2019-05-20: qty 1

## 2019-05-20 MED ORDER — PANTOPRAZOLE SODIUM 40 MG PO TBEC
40.0000 mg | DELAYED_RELEASE_TABLET | Freq: Every day | ORAL | Status: DC
  Administered 2019-05-20 – 2019-05-22 (×3): 40 mg via ORAL
  Filled 2019-05-20 (×5): qty 1

## 2019-05-20 MED ORDER — BENZTROPINE MESYLATE 1 MG PO TABS
1.0000 mg | ORAL_TABLET | Freq: Two times a day (BID) | ORAL | Status: DC
  Administered 2019-05-20 – 2019-05-22 (×4): 1 mg via ORAL
  Filled 2019-05-20 (×7): qty 1

## 2019-05-20 MED ORDER — LEVOTHYROXINE SODIUM 100 MCG PO TABS
100.0000 ug | ORAL_TABLET | Freq: Every day | ORAL | Status: DC
  Administered 2019-05-21 – 2019-05-22 (×2): 100 ug via ORAL
  Filled 2019-05-20 (×3): qty 1
  Filled 2019-05-20: qty 4

## 2019-05-20 MED ORDER — MAGNESIUM HYDROXIDE 400 MG/5ML PO SUSP: 30.0000 mL | Freq: Every day | ORAL | Status: DC | PRN

## 2019-05-20 MED ORDER — MODAFINIL 200 MG PO TABS
100.0000 mg | ORAL_TABLET | Freq: Every day | ORAL | Status: DC
  Administered 2019-05-21 – 2019-05-22 (×2): 100 mg via ORAL
  Filled 2019-05-20 (×2): qty 1

## 2019-05-20 MED ORDER — ESCITALOPRAM OXALATE 10 MG PO TABS
10.0000 mg | ORAL_TABLET | Freq: Every day | ORAL | Status: DC
  Administered 2019-05-20 – 2019-05-22 (×3): 10 mg via ORAL
  Filled 2019-05-20 (×5): qty 1

## 2019-05-20 MED ORDER — ALPRAZOLAM 1 MG PO TABS: 1.0000 mg | ORAL_TABLET | ORAL | Status: DC | PRN

## 2019-05-20 MED ORDER — TOPIRAMATE 100 MG PO TABS
300.0000 mg | ORAL_TABLET | Freq: Every day | ORAL | Status: DC
  Administered 2019-05-21 – 2019-05-22 (×2): 300 mg via ORAL
  Filled 2019-05-20 (×3): qty 3

## 2019-05-20 MED ORDER — ALUM & MAG HYDROXIDE-SIMETH 200-200-20 MG/5ML PO SUSP: 30.0000 mL | ORAL | Status: DC | PRN

## 2019-05-20 MED ORDER — LIOTHYRONINE SODIUM 5 MCG PO TABS
5.0000 ug | ORAL_TABLET | Freq: Every day | ORAL | Status: DC
  Administered 2019-05-20 – 2019-05-22 (×3): 5 ug via ORAL
  Filled 2019-05-20 (×5): qty 1

## 2019-05-20 MED ORDER — BUPROPION HCL ER (XL) 300 MG PO TB24
300.0000 mg | ORAL_TABLET | Freq: Every day | ORAL | Status: DC
  Administered 2019-05-20 – 2019-05-22 (×3): 300 mg via ORAL
  Filled 2019-05-20 (×5): qty 1

## 2019-05-20 NOTE — Progress Notes (Signed)
Patient refused evening labs. She stated that she needed every order in writing. She stated that her husband needed copies of everything. She does appear to have difficulty reading, but this could be due to her cataracts.

## 2019-05-20 NOTE — ED Triage Notes (Signed)
Pt's Husband has all personal items

## 2019-05-20 NOTE — ED Triage Notes (Signed)
Pharm called for Home med verification.

## 2019-05-20 NOTE — ED Notes (Signed)
Patient made her 1st phone call. 

## 2019-05-20 NOTE — ED Triage Notes (Signed)
Husband called and telephone # given to PT.

## 2019-05-20 NOTE — Progress Notes (Signed)
Pt was to be admitted involuntary per report. No paper work with pt from transport. Pt signed voluntary papers on admission. Per report, pt OD on ukn amount of klonopin. Pt denies OD attempt. Per pt, she had not slept in several days and took medication to help sleep. Pt has a hx of depression with ECT treatments. She receives disability for mental illness. Pt reports that she has had voices (auditory hallucinations) for years. Pt was tangential during assessment talking about wearing a size six pants to paying her children for work. Pt is tearful and anxious during admission. She uses a walker at home and has a medical hx of sarcoidosis, renal disease, narcolepsy and migraines with passing out. Pt denies having diabetes and chart shows a hx. Pt had cataract surgery and says that she has difficulty seeing with glasses on also. Pt is oriented to the unit.

## 2019-05-20 NOTE — Progress Notes (Signed)
Patient signed 72 hr request for discharge today 05/20/19 at 1610. Patient educated on purpose of request.

## 2019-05-20 NOTE — ED Notes (Signed)
Breakfast tray ordered 

## 2019-05-20 NOTE — ED Notes (Signed)
All belongings sent home w/ pt's husband.

## 2019-05-21 DIAGNOSIS — F310 Bipolar disorder, current episode hypomanic: Secondary | ICD-10-CM

## 2019-05-21 MED ORDER — LEVETIRACETAM 500 MG PO TABS
500.0000 mg | ORAL_TABLET | Freq: Two times a day (BID) | ORAL | Status: DC
  Administered 2019-05-21 – 2019-05-22 (×3): 500 mg via ORAL
  Filled 2019-05-21 (×5): qty 1

## 2019-05-21 MED ORDER — ALPRAZOLAM 1 MG PO TABS: 1.0000 mg | ORAL_TABLET | Freq: Three times a day (TID) | ORAL | Status: DC | PRN

## 2019-05-21 MED ORDER — FUROSEMIDE 20 MG PO TABS
20.0000 mg | ORAL_TABLET | Freq: Every day | ORAL | Status: DC
  Administered 2019-05-21 – 2019-05-22 (×2): 20 mg via ORAL
  Filled 2019-05-21 (×3): qty 1

## 2019-05-21 MED ORDER — CLONAZEPAM 1 MG PO TABS: 2.0000 mg | ORAL_TABLET | Freq: Three times a day (TID) | ORAL | Status: DC | PRN

## 2019-05-21 MED ORDER — ATORVASTATIN CALCIUM 40 MG PO TABS
40.0000 mg | ORAL_TABLET | Freq: Every day | ORAL | Status: DC
  Administered 2019-05-21: 40 mg via ORAL
  Filled 2019-05-21 (×2): qty 1

## 2019-05-21 MED ORDER — MELATONIN 5 MG PO TABS
10.0000 mg | ORAL_TABLET | Freq: Every day | ORAL | Status: DC
  Administered 2019-05-21: 10 mg via ORAL
  Filled 2019-05-21 (×2): qty 2

## 2019-05-21 MED ORDER — DOCUSATE SODIUM 100 MG PO CAPS
100.0000 mg | ORAL_CAPSULE | Freq: Two times a day (BID) | ORAL | Status: DC
  Administered 2019-05-21 – 2019-05-22 (×3): 100 mg via ORAL
  Filled 2019-05-21 (×5): qty 1

## 2019-05-21 MED ORDER — PRIMIDONE 50 MG PO TABS
50.0000 mg | ORAL_TABLET | Freq: Every day | ORAL | Status: DC
  Administered 2019-05-21 – 2019-05-22 (×2): 50 mg via ORAL
  Filled 2019-05-21 (×3): qty 1

## 2019-05-21 MED ORDER — PRIMIDONE 50 MG PO TABS
100.0000 mg | ORAL_TABLET | Freq: Every day | ORAL | Status: DC
  Administered 2019-05-21: 100 mg via ORAL
  Filled 2019-05-21 (×2): qty 2

## 2019-05-21 MED ORDER — METHOCARBAMOL 750 MG PO TABS: 750.0000 mg | ORAL_TABLET | Freq: Four times a day (QID) | ORAL | Status: DC | PRN

## 2019-05-21 MED ORDER — ORPHENADRINE CITRATE ER 100 MG PO TB12: 100.0000 mg | ORAL_TABLET | Freq: Two times a day (BID) | ORAL | Status: DC

## 2019-05-21 NOTE — [facility] Suicide Risk Assessment (Signed)
[facility]   Nursing information obtained from:  Patient Demographic factors:  Caucasian Current Mental Status:  Suicidal ideation indicated by others Loss Factors:  NA Historical Factors:  NA Risk Reduction Factors:  Living with another person, especially a relative  Total Time spent with patient: 30 minutes Principal Problem: <principal problem not specified> Diagnosis:  Active Problems:   Bipolar disorder (HCC)  Subjective Data: Patient is seen and examined.  Patient is a 53 year old female who [facility] with her husband on 05/19/2019.  It should be noted the evaluation was done by telephone because of problems with communication equipment.  The patient stated to me that she was fine and that she wanted to leave.  She apparently had presented to the emergency department secondary to feeling severely depressed on the date of admission.  She stated that she had "had enough".  She apparently had ingested an unknown quantity of unknown medications.  Her husband thought that it was a combination of clonazepam as well as an unspecified antipsychotic medication.  The patient stated that this was not true.  The husband told staff that he was talking with the patient, and she apparently at this point had become increasingly lethargic, and was unsure of what medication she had taken.  The patient stated that she is unable to remember many things.  She stated that she had ECT for approximately 2 years, and she has forgotten many things in her life including her wedding.  She told the staff in [facility] room that she did not even realize she was in the emergency room until 10 minutes after she had been there.  She admitted that she is under significant stress.  She stated that her and her husband are trying to sell their house, and that her husband works for her son, and there were guarantees there that were not  fulfilled.  She stated that she been followed by Dr. Evelene CroonKaur for 12 years, and during that time.  Had been on multiple medications that were not effective, and that throughout her life she had continued to have auditory hallucinations as well as intermittent problems with significant insomnia.  She has a history of migraine headaches, and takes a massive quantity of Topamax which dosage was confirmed by care everywhere on the last neurology note.  She stated that she had also been previously treated with lithium but that had led to kidney problems, and that previously Zyprexa and Seroquel did not assist with her sleep issues at all.  She denied ever having taken Tegretol in the past.  She stated that she experiences auditory hallucinations on a daily basis.  The husband felt as though that she had done poorly since she had started Geodon approximately a month ago.  The patient denied this, and on admission last night basically demanded that she received the Geodon.  Her [facility].  She was admitted to the hospital for evaluation and stabilization.  Continued Clinical Symptoms:  Alcohol Use Disorder Identification Test Final Score (AUDIT): 0 The "Alcohol Use Disorders Identification Test", Guidelines for Use in Primary Care, Second Edition.  World Science writerHealth Organization [place](WHO). Score between 0-7:  no or low risk or alcohol related problems. Score between 8-15:  moderate risk of alcohol related problems. Score between 16-19:  high risk of alcohol related problems. Score 20 or above:  warrants further diagnostic evaluation for alcohol  dependence and treatment.   CLINICAL FACTORS:   Severe Anxiety and/or Agitation Bipolar Disorder:   Mixed State Personality Disorders:   Cluster B More than one psychiatric diagnosis Previous Psychiatric Diagnoses and Treatments   Musculoskeletal: Strength & Muscle Tone: abnormal Gait & Station:  unsteady Patient leans: N/A  Psychiatric Specialty Exam: Physical Exam  Nursing note and vitals reviewed. Constitutional: She is oriented to person, place, and time. She appears well-developed and well-nourished.  HENT:  Head: Normocephalic and atraumatic.  Respiratory: Effort normal.  Neurological: She is alert and oriented to person, place, and time.    ROS  Blood pressure 102/71, pulse 89, temperature 97.8 F (36.6 C), temperature source Oral, resp. rate 16, height 5\' 4"  (1.626 m), weight 72.1 kg.Body mass index is 27.29 kg/m.  General Appearance: Casual  Eye Contact:  Good  Speech:  Pressured  Volume:  Increased  Mood:  Anxious, Dysphoric and Irritable  Affect:  Labile  Thought Process:  Coherent and Descriptions of Associations: Circumstantial  Orientation:  Full (Time, Place, and Person)  Thought Content:  Hallucinations: Auditory  Suicidal Thoughts:  No  Homicidal Thoughts:  No  Memory:  Immediate;   Fair Recent;   Good Remote;   Fair  Judgement:  Impaired  Insight:  Fair  Psychomotor Activity:  Increased  Concentration:  Concentration: Fair and Attention Span: Fair  Recall:  FiservFair  Fund of Knowledge:  Fair  Language:  Good  Akathisia:  Negative  Handed:  Right  AIMS (if indicated):     Assets:  Desire for Improvement Resilience  ADL's:  Intact  Cognition:  WNL  Sleep:  Number of Hours: 5.5      COGNITIVE FEATURES THAT CONTRIBUTE TO RISK:  Thought constriction (tunnel vision)    SUICIDE RISK:   Minimal: No identifiable suicidal ideation.  Patients presenting with no risk factors but with morbid ruminations; may be classified as minimal risk based on the severity of the depressive symptoms  PLAN OF CARE: Patient is seen and examined.  Patient is a 53 year old female with the above-stated past psychiatric history who was admitted secondary to concern for an intentional overdose as well as depression and auditory hallucinations.  The patient stated that she  is not suicidal, and that she has a podiatrist appointment on Tuesday and an appointment with someone else on Thursday.  She like to be released from the hospital.  I explained to the patient that we need to get collateral information from her husband and talk about what is going on most recently.  She will be admitted to the hospital.  She will be integrated into the milieu.  She will be encouraged to attend groups.  Her psychiatric medications will be continued as is.  We do not have a new vigil, but we do have Provigil and that will be started.  She takes clonazepam 2 mg p.o. 4 times daily as needed, and I will continue that for now given the fact that I am concerned about possible withdrawal seizures.  Her memory issues may be related to the massive quantity of Topamax that she takes, but she stated she had been on that for quite a while.  She had been previously treated with lithium, and had problems apparently with her kidneys.  She basically demanded to be back on the Geodon, and that was continued.  We will attempt to get collateral information from her husband as well as Dr. Evelene CroonKaur sometime tomorrow.  She stated she has had problems with  her ankle since May after spraining it, and is currently in a wheelchair.  We will attempt to get an ankle brace for her.  Review of her laboratories showed some metabolic abnormalities that are most likely secondary to some degree with the Topamax.  She looks like she has a metabolic alkalosis which is most likely secondary to her Lasix as well as the Topamax.  Her creatinine is at 1.19, and apparently that is stable.  Previously had been as high as 1.72.  Her drug screen was positive for barbiturates as well as benzodiazepines.  The barbiturate positive drug screen is most likely secondary to the primidone.  She has refused any additional lab test because of the cost.  Her TSH was 1.473 which had been obtained 5 days ago.  No change in her thyroid medications.  Her  hemoglobin A1c from 7/16 was 5.7.  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  I certify that inpatient services furnished can reasonably be expected to improve the patient's condition.   Sharma Covert, MD 05/21/2019, 8:30 AM

## 2019-05-21 NOTE — H&P (Signed)
Psychiatric Admission Assessment Adult  Patient Identification: Jennifer QuamMichele F Bellerose MRN:  [id number] Date of Evaluation:  05/21/2019 Chief Complaint:  Bipolar Disorder, Depressed Principal Diagnosis: <principal problem not specified> Diagnosis:  Active Problems:   Bipolar disorder (HCC)  History of Present Illness: Patient is seen and examined.  Patient is a 53 year old female who originally presented to the [facility] emergency department with her husband on 05/19/2019.  It should be noted the evaluation was done by telephone because of problems with communication equipment.  The patient stated to me that she was fine and that she wanted to leave.  She apparently had presented to the emergency department secondary to feeling severely depressed on the date of admission.  She stated that she had "had enough".  She apparently had ingested an unknown quantity of unknown medications.  Her husband thought that it was a combination of clonazepam as well as an unspecified antipsychotic medication.  The patient stated that this was not true.  The husband told staff that he was talking with the patient, and she apparently at this point had become increasingly lethargic, and was unsure of what medication she had taken.  The patient stated that she is unable to remember many things.  She stated that she had ECT for approximately 2 years, and she has forgotten many things in her life including her wedding.  She told the staff in the [facility] emergency room that she did not even realize she was in the emergency room until 10 minutes after she had been there.  She admitted that she is under significant stress.  She stated that her and her husband are trying to sell their house, and that her husband works for her son, and there were guarantees there that were not fulfilled.  She stated that she been followed by Dr. Evelene CroonKaur for 12 years, and during that time.  Had been on multiple medications that were not effective, and that  throughout her life she had continued to have auditory hallucinations as well as intermittent problems with significant insomnia.  She has a history of migraine headaches, and takes a massive quantity of Topamax which dosage was confirmed by care everywhere on the last neurology note.  She stated that she had also been previously treated with lithium but that had led to kidney problems, and that previously Zyprexa and Seroquel did not assist with her sleep issues at all.  She denied ever having taken Tegretol in the past.  She stated that she experiences auditory hallucinations on a daily basis.  The husband felt as though that she had done poorly since she had started Geodon approximately a month ago.  The patient denied this, and on admission last night basically demanded that she received the Geodon.  Her last psychiatric hospitalization was over 12 years ago at [facility].  She was admitted to the hospital for evaluation and stabilization.  Associated Signs/Symptoms: Depression Symptoms:  depressed mood, anhedonia, insomnia, psychomotor agitation, fatigue, feelings of worthlessness/guilt, difficulty concentrating, hopelessness, suicidal thoughts without plan, anxiety, loss of energy/fatigue, disturbed sleep, (Hypo) Manic Symptoms:  Hallucinations, Impulsivity, Irritable Mood, Labiality of Mood, Anxiety Symptoms:  Excessive Worry, Psychotic Symptoms:  Hallucinations: Auditory PTSD Symptoms: Negative Total Time spent with patient: 45 minutes  Past Psychiatric History: Patient has been in psychiatric treatment for many years.  She is received multiple medications for depression, anxiety as well as psychosis.  Her last psychiatric hospitalization was many years ago at [facility].  She has  been followed by Dr. Evelene Croon for the last 12 years per the patient.  She also received ECT over 2-year period of time by her own description.  Is the patient at risk to  self? Yes.    Has the patient been a risk to self in the past 6 months? Yes.    Has the patient been a risk to self within the distant past? Yes.    Is the patient a risk to others? No.  Has the patient been a risk to others in the past 6 months? No.  Has the patient been a risk to others within the distant past? No.   Prior Inpatient Therapy:   Prior Outpatient Therapy:    Alcohol Screening: Patient refused Alcohol Screening Tool: Yes 1. How often do you have a drink containing alcohol?: Never 2. How many drinks containing alcohol do you have on a typical day when you are drinking?: 1 or 2 3. How often do you have six or more drinks on one occasion?: Never AUDIT-C Score: 0 4. How often during the last year have you found that you were not able to stop drinking once you had started?: Never 5. How often during the last year have you failed to do what was normally expected from you becasue of drinking?: Never 6. How often during the last year have you needed a first drink in the morning to get yourself going after a heavy drinking session?: Never 7. How often during the last year have you had a feeling of guilt of remorse after drinking?: Never 8. How often during the last year have you been unable to remember what happened the night before because you had been drinking?: Never 9. Have you or someone else been injured as a result of your drinking?: No 10. Has a relative or friend or a doctor or another health worker been concerned about your drinking or suggested you cut down?: No Alcohol Use Disorder Identification Test Final Score (AUDIT): 0 Alcohol Brief Interventions/Follow-up: AUDIT Score <7 follow-up not indicated Substance Abuse History in the last 12 months:  No. Consequences of Substance Abuse: Negative Previous Psychotropic Medications: Yes  Psychological Evaluations: Yes  Past Medical History:  Past Medical History:  Diagnosis Date  . Acid reflux   . Anemia   . Anxiety   .  Bipolar disorder (HCC)   . Chronic kidney disease    STAGE 3  . Depression   . Diabetes mellitus    NIDD  . Essential tremor   . Family history of ovarian cancer    Pt doesn't meet Medicare genetic testing guidelines  . Hyperlipidemia   . Hypothyroid   . IBS (irritable bowel syndrome)   . Migraine headache    vestibular migraine  . Obesity   . Osteopenia   . Restless leg syndrome   . Sarcoidosis   . Sleep apnea   . Syncope \    Past Surgical History:  Procedure Laterality Date  . ABDOMINAL HYSTERECTOMY    . APPENDECTOMY    . BLADDER SURGERY     bladder tact  . CATARACT EXTRACTION W/PHACO Right 09/21/2018   Procedure: CATARACT EXTRACTION PHACO AND INTRAOCULAR LENS PLACEMENT (IOC);  Surgeon: Nevada Crane, MD;  Location: [facility] ORS;  Service: Ophthalmology;  Laterality: Right;  [place]  00:20 CDE 00.82 Fluid pack lot # [id number]  . CATARACT EXTRACTION W/PHACO Left 10/17/2018   Procedure: CATARACT EXTRACTION PHACO AND INTRAOCULAR LENS PLACEMENT (IOC)  LEFT;  Surgeon: Nevada Crane,  MD;  Location: [facility];  Service: Ophthalmology;  Laterality: Left;  diabetic - diet controlled  . CESAREAN SECTION    . CHOLECYSTECTOMY    . COLONOSCOPY WITH PROPOFOL N/A 04/05/2017   Procedure: COLONOSCOPY WITH PROPOFOL;  Surgeon: Christena DeemSkulskie, Martin U, MD;  [facility];  Service: Endoscopy;  Laterality: N/A;  . ESOPHAGOGASTRODUODENOSCOPY (EGD) WITH PROPOFOL N/A 08/28/2016   Procedure: ESOPHAGOGASTRODUODENOSCOPY (EGD) WITH PROPOFOL;  Surgeon: [name], MD;  [facility] END[facility]COPY;  Service: Endoscopy;  Laterality: N/A;  . HERNIA REPAIR    . INGUINAL HERNIA REPAIR     rt and left  . lexiscan cardiolite    . TILT TABLE STUDY N/A 04/01/2012   Procedure: TILT TABLE STUDY;  Surgeon: [name], MD;  [facility];  Service: Cardiovascular;  Laterality: N/A;   Family History:  Family History  Problem Relation Age of Onset  . Colon cancer Father   .  Stomach cancer Father   . Hypertension Mother   . Hyperlipidemia Mother   . Alcohol abuse Brother   . Irritable bowel syndrome Sister   . Breast cancer Paternal Aunt   . Ovarian cancer Paternal Aunt   . Kidney cancer Neg Hx   . Kidney disease Neg Hx   . Prostate cancer Neg Hx    Family Psychiatric  History: Multiple family members with psychiatric issues Tobacco Screening: Have you used any form of tobacco in the last 30 days? (Cigarettes, Smokeless Tobacco, Cigars, and/or Pipes): No Social History:  Social History   Substance and Sexual Activity  Alcohol Use No     Social History   Substance and Sexual Activity  Drug Use No    Additional Social History:                           Allergies:   Allergies  Allergen Reactions  . Cleocin [Clindamycin Hcl] Other (See Comments)    GI distress   Lab Results:  Results for orders placed or performed during the hospital encounter of 05/19/19 (from the past 48 hour(s))  Ethanol     Status: None   Collection Time: 05/19/19  3:08 PM  Result Value Ref Range   Alcohol, Ethyl (B) <10 <10 mg/dL    Comment: (NOTE) Lowest detectable limit for serum alcohol is 10 mg/dL. For medical purposes only. Performed at [facility], [address] [place], [place] [address]   Salicylate level     Status: None   Collection Time: 05/19/19  3:08 PM  Result Value Ref Range   Salicylate Lvl <7.0 2.8 - 30.0 mg/dL    Comment: Performed at [facility], [address] [place], [place] [address]  Acetaminophen level     Status: Abnormal   Collection Time: 05/19/19  3:08 PM  Result Value Ref Range   Acetaminophen (Tylenol), Serum <10 (L) 10 - 30 ug/mL    Comment: (NOTE) Therapeutic concentrations vary significantly. A range of 10-30 ug/mL  may be an effective concentration for many patients. However, some  are best treated at concentrations outside of this range. Acetaminophen concentrations >150 ug/mL at 4 hours after  ingestion  and >50 ug/mL at 12 hours after ingestion are often associated with  toxic reactions. Performed at [facility], [address] [place], [place] [address]   Comprehensive metabolic panel     Status: Abnormal   Collection Time: 05/19/19  3:43 PM  Result Value Ref Range   Sodium 141 135 - 145 mmol/L   Potassium 3.8 3.5 - 5.1 mmol/L   Chloride 111 98 - 111 mmol/L   CO2 21 (L) 22 - 32 mmol/L   Glucose, Bld 120 (H) 70 - 99 mg/dL   BUN 23 (H) 6 - 20 mg/dL   Creatinine, Ser 1.19 (H) 0.44 - 1.00 mg/dL   Calcium 8.7 (L) 8.9 - 10.3 mg/dL   Total Protein 6.1 (L) 6.5 - 8.1 g/dL   Albumin 3.7 3.5 - 5.0 g/dL   AST 31 15 - 41 U/L   ALT 30 0 - 44 U/L   Alkaline Phosphatase 52 38 - 126 U/L   Total Bilirubin 0.6 0.3 - 1.2 mg/dL   GFR calc non Af Amer 52 (L) >60 mL/min   GFR calc Af Amer >60 >60 mL/min   Anion gap 9 5 - 15    Comment: Performed at [facility], [place] [address], [place], [place] [address]  cbc     Status: None   Collection Time: 05/19/19  3:43 PM  Result Value Ref Range   WBC 5.0 4.0 - 10.5 K/uL   RBC 4.09 3.87 - 5.11 MIL/uL   Hemoglobin 12.4 12.0 - 15.0 g/dL   HCT 37.1 36.0 - 46.0 %   MCV 90.7 80.0 - 100.0 fL   MCH 30.3 26.0 - 34.0 pg   MCHC 33.4 30.0 - 36.0 g/dL   RDW 12.3 11.5 - 15.5 %   Platelets 172 150 - 400 K/uL   nRBC 0.0 0.0 - 0.2 %    Comment: Performed at [facility], [place] [address] [place], [place] [address]  I-Stat beta hCG blood, ED     Status: None   Collection Time: 05/19/19  3:44 PM  Result Value Ref Range   I-stat hCG, quantitative <5.0 <5 mIU/mL   Comment 3            Comment:   GEST. AGE      CONC.  (mIU/mL)   <=1 WEEK        5 - 50     2 WEEKS       50 - 500     3 WEEKS       100 - 10,000     4 WEEKS     1,000 - 30,000        FEMALE AND NON-PREGNANT FEMALE:     LESS THAN 5 mIU/mL   SARS Coronavirus 2 [facility] order, Performed in [place] hospital lab)     Status: None   Collection Time: 05/19/19  3:50 PM   Result Value Ref Range   SARS Coronavirus 2 NEGATIVE NEGATIVE    Comment: (NOTE) If result is NEGATIVE SARS-CoV-2 target nucleic acids are NOT DETECTED. The SARS-CoV-2 RNA is generally detectable in upper and lower  respiratory specimens during the acute phase of infection. The lowest  concentration of SARS-CoV-2 viral copies this assay can detect is 250  copies / mL. A negative result does not preclude SARS-CoV-2 infection  and should not be used as the sole basis for treatment or other  patient management decisions.  A negative result may occur with  improper specimen collection / handling, submission of specimen other  than nasopharyngeal swab, presence of viral mutation(s) within the  areas targeted by this assay, and inadequate number of viral copies  (<250 copies / mL). A negative result must be combined with clinical  observations,  patient history, and epidemiological information. If result is POSITIVE SARS-CoV-2 target nucleic acids are DETECTED. The SARS-CoV-2 RNA is generally detectable in upper and lower  respiratory specimens dur ing the acute phase of infection.  Positive  results are indicative of active infection with SARS-CoV-2.  Clinical  correlation with patient history and other diagnostic information is  necessary to determine patient infection status.  Positive results do  not rule out bacterial infection or co-infection with other viruses. If result is PRESUMPTIVE POSTIVE SARS-CoV-2 nucleic acids MAY BE PRESENT.   A presumptive positive result was obtained on the submitted specimen  and confirmed on repeat testing.  While 2019 novel coronavirus  (SARS-CoV-2) nucleic acids may be present in the submitted sample  additional confirmatory testing may be necessary for epidemiological  and / or clinical management purposes  to differentiate between  SARS-CoV-2 and other Sarbecovirus currently known to infect humans.  If clinically indicated additional testing with  an alternate test  methodology [phone]) is advised. The SARS-CoV-2 RNA is generally  detectable in upper and lower respiratory sp ecimens during the acute  phase of infection. The expected result is Negative. Fact Sheet for Patients:  BoilerBrush.com.cy Fact Sheet for Healthcare Providers: https://pope.com/ This test is not yet approved or cleared by the [place] FDA and has been authorized for detection and/or diagnosis of SARS-CoV-2 by FDA under an Emergency Use Authorization (EUA).  This EUA will remain in effect (meaning this test can be used) for the duration of the COVID-19 declaration under Section 564(b)(1) of the Act, 21 U.S.C. section 360bbb-3(b)(1), unless the authorization is terminated or revoked sooner. Performed at [facility], [address], [place], [place] [address]   Rapid urine drug screen (hospital performed)     Status: Abnormal   Collection Time: 05/19/19  4:57 PM  Result Value Ref Range   Opiates NONE DETECTED NONE DETECTED   Cocaine NONE DETECTED NONE DETECTED   Benzodiazepines POSITIVE (A) NONE DETECTED   Amphetamines NONE DETECTED NONE DETECTED   Tetrahydrocannabinol NONE DETECTED NONE DETECTED   Barbiturates POSITIVE (A) NONE DETECTED    Comment: (NOTE) DRUG SCREEN FOR MEDICAL PURPOSES ONLY.  IF CONFIRMATION IS NEEDED FOR ANY PURPOSE, NOTIFY LAB WITHIN 5 DAYS. LOWEST DETECTABLE LIMITS FOR URINE DRUG SCREEN Drug Class                     Cutoff (ng/mL) Amphetamine and metabolites    1000 Barbiturate and metabolites    200 Benzodiazepine                 200 Tricyclics and metabolites     300 Opiates and metabolites        300 Cocaine and metabolites        300 THC                            50 Performed at [facility], [address] [place], [place] [address]   CBG monitoring, ED     Status: None   Collection Time: 05/19/19  8:23 PM  Result Value Ref Range    Glucose-Capillary 91 70 - 99 mg/dL   Comment 1 Notify RN    Comment 2 Document in Chart     Blood Alcohol level:  Lab Results  Component Value Date   ETH <10 05/19/2019    Metabolic Disorder Labs:  Lab Results  Component Value Date  HGBA1C 5.2 07/24/2016   MPG 103 07/24/2016   No results found for: PROLACTIN Lab Results  Component Value Date   CHOL 157 05/17/2014   TRIG 271 (H) 05/17/2014   HDL 29 (L) 05/17/2014   VLDL 54 (H) 05/17/2014   LDLCALC 74 05/17/2014    Current Medications: Current Facility-Administered Medications  Medication Dose Route Frequency Provider Last Rate Last Dose  . acetaminophen (TYLENOL) tablet 650 mg  650 mg Oral Q6H PRN Antonieta Pert, MD      . ALPRAZolam Prudy Feeler) tablet 1 mg  1 mg Oral TID PRN Antonieta Pert, MD      . alum & mag hydroxide-simeth (MAALOX/MYLANTA) 200-200-20 MG/5ML suspension 30 mL  30 mL Oral Q4H PRN Antonieta Pert, MD      . atorvastatin (LIPITOR) tablet 40 mg  40 mg Oral q1800 Antonieta Pert, MD      . benztropine (COGENTIN) tablet 1 mg  1 mg Oral BID Antonieta Pert, MD   1 mg at 05/21/19 0845  . buPROPion (WELLBUTRIN XL) 24 hr tablet 300 mg  300 mg Oral Daily Antonieta Pert, MD   300 mg at 05/21/19 0844  . docusate sodium (COLACE) capsule 100 mg  100 mg Oral BID Antonieta Pert, MD   100 mg at 05/21/19 1016  . enalapril (VASOTEC) tablet 2.5 mg  2.5 mg Oral Daily Antonieta Pert, MD   2.5 mg at 05/21/19 0844  . escitalopram (LEXAPRO) tablet 10 mg  10 mg Oral Daily Antonieta Pert, MD   10 mg at 05/21/19 0846  . furosemide (LASIX) tablet 20 mg  20 mg Oral Daily Antonieta Pert, MD   20 mg at 05/21/19 1015  . hydrOXYzine (ATARAX/VISTARIL) tablet 25 mg  25 mg Oral TID PRN Antonieta Pert, MD      . levETIRAcetam (KEPPRA) tablet 500 mg  500 mg Oral BID Antonieta Pert, MD   500 mg at 05/21/19 1015  . levothyroxine (SYNTHROID) tablet 100 mcg  100 mcg Oral Q0600 Antonieta Pert, MD   100  mcg at 05/21/19 0606  . liothyronine (CYTOMEL) tablet 5 mcg  5 mcg Oral Daily Antonieta Pert, MD   5 mcg at 05/21/19 [phone]  . magnesium hydroxide (MILK OF MAGNESIA) suspension 30 mL  30 mL Oral Daily PRN Antonieta Pert, MD      . Melatonin TABS 10 mg  10 mg Oral QHS Antonieta Pert, MD      . methocarbamol (ROBAXIN) tablet 750 mg  750 mg Oral Q6H PRN Antonieta Pert, MD      . modafinil (PROVIGIL) tablet 100 mg  100 mg Oral Daily Antonieta Pert, MD   100 mg at 05/21/19 9604  . pantoprazole (PROTONIX) EC tablet 40 mg  40 mg Oral Daily Antonieta Pert, MD   40 mg at 05/21/19 0844  . primidone (MYSOLINE) tablet 50 mg  50 mg Oral Daily Antonieta Pert, MD   50 mg at 05/21/19 1017   And  . primidone (MYSOLINE) tablet 100 mg  100 mg Oral QHS Antonieta Pert, MD      . topiramate (TOPAMAX) tablet 300 mg  300 mg Oral Daily Antonieta Pert, MD   300 mg at 05/21/19 0841  . topiramate (TOPAMAX) tablet 400 mg  400 mg Oral QHS Antonieta Pert, MD   400 mg at 05/20/19 2155  . traZODone (DESYREL) tablet 50 mg  50  mg Oral QHS PRN Antonieta Pert, MD   50 mg at 05/20/19 2155  . ziprasidone (GEODON) capsule 180 mg  180 mg Oral Q supper Antonieta Pert, MD   180 mg at 05/20/19 1741   PTA Medications: Medications Prior to Admission  Medication Sig Dispense Refill Last Dose  . ALPRAZolam (XANAX) 1 MG tablet Take 1 mg by mouth 3 (three) times daily as needed for anxiety or sleep.     . Armodafinil 250 MG tablet Take 250 mg by mouth daily.     Marland Kitchen atorvastatin (LIPITOR) 40 MG tablet Take 40 mg by mouth at bedtime.      . benztropine (COGENTIN) 1 MG tablet Take 1 mg by mouth 2 (two) times daily.      Marland Kitchen buPROPion (WELLBUTRIN XL) 300 MG 24 hr tablet Take 300 mg by mouth daily.     Marland Kitchen docusate sodium (COLACE) 100 MG capsule Take 100 mg by mouth 2 (two) times daily.      . fish oil-omega-3 fatty acids 1000 MG capsule Take 1 g by mouth daily.      . furosemide (LASIX) 20 MG tablet Take  20 mg by mouth daily.     Marland Kitchen levothyroxine (SYNTHROID, LEVOTHROID) 100 MCG tablet Take 100 mcg by mouth daily before breakfast. Takes at bedtime     . liothyronine (CYTOMEL) 5 MCG tablet Take 5 mcg by mouth 2 (two) times daily.      . methocarbamol (ROBAXIN) 750 MG tablet Take 750 mg by mouth as needed for muscle spasms.     . Multiple Vitamin (MULITIVITAMIN WITH MINERALS) TABS Take 1 tablet by mouth daily.     . Nutritional Supplements (MELATONIN PO) Take 10 mg by mouth at bedtime.      . Ospemifene (OSPHENA) 60 MG TABS Take 1 tablet by mouth daily. 30 tablet 11   . pantoprazole (PROTONIX) 40 MG tablet Take 40 mg by mouth daily.     . primidone (MYSOLINE) 50 MG tablet Take 50-100 mg by mouth See admin instructions. 50 mg daily(breakfast) , 100 mg at bedtime     . pyridOXINE (VITAMIN B-6) 100 MG tablet Take 100 mg by mouth daily.     Marland Kitchen topiramate (TOPAMAX) 200 MG tablet Take 300-400 mg by mouth See admin instructions. pt take 2 tabs (400 mg) q am, and 1.5 (300 mg) hs     . ziprasidone (GEODON) 60 MG capsule Take 180 mg by mouth every evening. After dinner       Musculoskeletal: Strength & Muscle Tone: within normal limits Gait & Station: unsteady Patient leans: N/A  Psychiatric Specialty Exam: Physical Exam  Nursing note and vitals reviewed. Constitutional: She is oriented to person, place, and time. She appears well-developed and well-nourished.  HENT:  Head: Normocephalic and atraumatic.  Respiratory: Effort normal.  Neurological: She is alert and oriented to person, place, and time.    ROS  Blood pressure 102/71, pulse 89, temperature 97.8 F (36.6 C), temperature source Oral, resp. rate 16, height 5\' 4"  (1.626 m), weight 72.1 kg.Body mass index is 27.29 kg/m.  General Appearance: Casual  Eye Contact:  Fair  Speech:  Pressured  Volume:  Increased  Mood:  Anxious, Depressed, Dysphoric and Irritable  Affect:  Congruent  Thought Process:  Coherent and Descriptions of  Associations: Circumstantial  Orientation:  Full (Time, Place, and Person)  Thought Content:  Hallucinations: Auditory  Suicidal Thoughts:  No  Homicidal Thoughts:  No  Memory:  Immediate;  Poor Recent;   Poor Remote;   Poor  Judgement:  Impaired  Insight:  Lacking  Psychomotor Activity:  Increased  Concentration:  Concentration: Fair and Attention Span: Fair  Recall:  Poor  Fund of Knowledge:  Fair  Language:  Fair  Akathisia:  Negative  Handed:  Right  AIMS (if indicated):     Assets:  Desire for Improvement Resilience  ADL's:  Intact  Cognition:  WNL  Sleep:  Number of Hours: 5.5    Treatment Plan Summary: Daily contact with patient to assess and evaluate symptoms and progress in treatment, Medication management and Plan : Patient is seen and examined.  Patient is a 53 year old female with the above-stated past psychiatric history who was admitted secondary to concern for an intentional overdose as well as depression and auditory hallucinations.  The patient stated that she is not suicidal, and that she has a podiatrist appointment on Tuesday and an appointment with someone else on Thursday.  She like to be released from the hospital.  I explained to the patient that we need to get collateral information from her husband and talk about what is going on most recently.  She will be admitted to the hospital.  She will be integrated into the milieu.  She will be encouraged to attend groups.  Her psychiatric medications will be continued as is.  We do not have a new vigil, but we do have Provigil and that will be started.  She takes clonazepam 2 mg p.o. 4 times daily as needed, and I will continue that for now given the fact that I am concerned about possible withdrawal seizures.  Her memory issues may be related to the massive quantity of Topamax that she takes, but she stated she had been on that for quite a while.  She had been previously treated with lithium, and had problems apparently  with her kidneys.  She basically demanded to be back on the Geodon, and that was continued.  We will attempt to get collateral information from her husband as well as Dr. Evelene Croon sometime tomorrow.  She stated she has had problems with her ankle since May after spraining it, and is currently in a wheelchair.  We will attempt to get an ankle brace for her.  Review of her laboratories showed some metabolic abnormalities that are most likely secondary to some degree with the Topamax.  She looks like she has a metabolic alkalosis which is most likely secondary to her Lasix as well as the Topamax.  Her creatinine is at 1.19, and apparently that is stable.  Previously had been as high as 1.72.  Her drug screen was positive for barbiturates as well as benzodiazepines.  The barbiturate positive drug screen is most likely secondary to the primidone.  She has refused any additional lab test because of the cost.  Her TSH was 1.473 which had been obtained 5 days ago.  No change in her thyroid medications.  Her hemoglobin A1c from 7/16 was 5.7.  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  Observation Level/Precautions:  Fall 15 minute checks Seizure  Laboratory:  Chemistry Profile  Psychotherapy:    Medications:    Consultations:    Discharge Concerns:    Estimated LOS:  Other:     Physician Treatment Plan for Primary Diagnosis: <principal problem not specified> Long Term Goal(s): Improvement in symptoms so as ready for discharge  Short Term Goals: Ability to identify changes in lifestyle to reduce  recurrence of condition will improve, Ability to verbalize feelings will improve, Ability to disclose and discuss suicidal ideas, Ability to demonstrate self-control will improve, Ability to identify and develop effective coping behaviors will improve, Ability to maintain clinical measurements within normal limits will improve and Compliance with prescribed medications will improve  Physician  Treatment Plan for Secondary Diagnosis: Active Problems:   Bipolar disorder (HCC)  Long Term Goal(s): Improvement in symptoms so as ready for discharge  Short Term Goals: Ability to identify changes in lifestyle to reduce recurrence of condition will improve, Ability to verbalize feelings will improve, Ability to disclose and discuss suicidal ideas, Ability to demonstrate self-control will improve, Ability to identify and develop effective coping behaviors will improve, Ability to maintain clinical measurements within normal limits will improve and Compliance with prescribed medications will improve  I certify that inpatient services furnished can reasonably be expected to improve the patient's condition.    Antonieta PertGreg Lawson Hazael Olveda, MD 8/2/202012:23 PM

## 2019-05-21 NOTE — Progress Notes (Signed)
   05/21/19 0956  COVID-19 Daily Checkoff  Have you had a fever (temp > 37.80C/100F)  in the past 24 hours?  No  If you have had runny nose, nasal congestion, sneezing in the past 24 hours, has it worsened? No  COVID-19 EXPOSURE  Have you traveled outside the state in the past 14 days? No  Have you been in contact with someone with a confirmed diagnosis of COVID-19 or PUI in the past 14 days without wearing appropriate PPE? No  Have you been living in the same home as a person with confirmed diagnosis of COVID-19 or a PUI (household contact)? No  Have you been diagnosed with COVID-19? No

## 2019-05-21 NOTE — Progress Notes (Signed)
DAR NOTE: Pt present with flat affect and irritable mood in the unit. Pt complained about her not getting her medications as she is supposed to. Pt appeared drowsy and could not keep her eyes open, but still asking for medications to help her with sleep. Pt denies physical pain, took all her meds as scheduled. Pt's safety ensured with 15 minute and environmental checks. Pt currently denies SI/HI and A/V hallucinations. Pt verbally agrees to seek staff if SI/HI or A/VH occurs and to consult with staff before acting on these thoughts. Will continue POC.

## 2019-05-21 NOTE — Progress Notes (Signed)
Self-inventory: Patient slept fair and declined medication for sleep. Her appetite is fair, energy normal and concentration poor. She rates her depression and anxiety 8/10 and hopelessness 0/10. She denies withdrawal symptoms. She denies suicidal thoughts. She c/o left foot and ankle pain, for which a brace was obtained and placed. Goal: "Get the hell out of here."

## 2019-05-21 NOTE — Progress Notes (Signed)
D: Patient is demanding and suspicious of the medications we are giving her. She believes she is not getting the meds she is routinely taking at home. She complains of AH that are baseline for her, and that she is afraid of someone coming up from behind her and that is why she sits in the corner of the room. Patient denies SI/HI/VH. Her gait is unsteady and safety precautions reviewed. Patient is compliant with her walker.  A: Patient checked q15 min, and checks reviewed. Reviewed medication changes with patient and educated on side effects. Educated patient on importance of attending group therapy sessions and educated on several coping skills. Encouarged participation in milieu through recreation therapy and attending meals with peers. Support and encouragement provided. Fluids offered. R: Patient receptive to education on medications, and is medication compliant. Patient contracts for safety on the unit.
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
## 2019-05-22 NOTE — [facility] Suicide Risk Assessment (Signed)
[facility] INPATIENT:  Family/Significant Other Suicide Prevention Education  Suicide Prevention Education:  Education Completed; with husband, Jennifer Bright [phone]) has been identified by the patient as the family member/significant other with whom the patient will be residing, and identified as the person(s) who will aid the patient in the event of a mental health crisis (suicidal ideations/suicide attempt).  With written consent from the patient, the family member/significant other has been provided the following suicide prevention education, prior to the and/or following the discharge of the patient.  The suicide prevention education provided includes the following:  Suicide risk factors  Suicide prevention and interventions  National Suicide Hotline telephone number  [facility] assessment telephone number  [facility] Emergency Assistance [place] and/or Residential Mobile Crisis Unit telephone number  Request made of family/significant other to:  Remove weapons (e.g., guns, rifles, knives), all items previously/currently identified as safety concern.    Remove drugs/medications (over-the-counter, prescriptions, illicit drugs), all items previously/currently identified as a safety concern.  The family member/significant other verbalizes understanding of the suicide prevention education information provided.  The family member/significant other agrees to remove the items of safety concern listed above.  Jennifer Bright reports he does not have any questions or concerns regarding the patient's discharge. He reports he would like his wife to be referred to [facility] for additional therapy services.   He reports that he will pick the patient up at discharge. CSW will continue to follow for a safe discharge.   Marylee Floras 05/22/2019, 9:28 AM

## 2019-05-22 NOTE — Discharge Summary (Signed)
Physician Discharge Summary Note  Patient:  Jennifer Bright is an 53 y.o., female  MRN:  [id number]  DOB:  1966/04/15  Patient phone:  [phone] (home)   Patient address:   [address] [place] [address],   Total Time spent with patient: 30 minutes  Date of Admission:  05/20/2019 Date of Discharge: 05-22-19  Reason for Admission: Worsening symptoms of Bipolar disorder & suspected drug overdose.  Principal Problem: Bipolar disorder [facility])  Discharge Diagnoses: Principal Problem:   Bipolar disorder [facility])  Past Psychiatric History: Bipolar disorder  Past Medical History:  Past Medical History:  Diagnosis Date  . Acid reflux   . Anemia   . Anxiety   . Bipolar disorder ([place])   . Chronic kidney disease    STAGE 3  . Depression   . Diabetes mellitus    NIDD  . Essential tremor   . Family history of ovarian cancer    Pt doesn't meet Medicare genetic testing guidelines  . Hyperlipidemia   . Hypothyroid   . IBS (irritable bowel syndrome)   . Migraine headache    vestibular migraine  . Obesity   . Osteopenia   . Restless leg syndrome   . Sarcoidosis   . Sleep apnea   . Syncope \    Past Surgical History:  Procedure Laterality Date  . ABDOMINAL HYSTERECTOMY    . APPENDECTOMY    . BLADDER SURGERY     bladder tact  . CATARACT EXTRACTION W/PHACO Right 09/21/2018   Procedure: CATARACT EXTRACTION PHACO AND INTRAOCULAR LENS PLACEMENT ([place]);  Surgeon: [name], MD;  Location: [facility] ORS;  Service: Ophthalmology;  Laterality: Right;  [place]  00:20 CDE 00.82 Fluid pack lot # [id number]  . CATARACT EXTRACTION W/PHACO Left 10/17/2018   Procedure: CATARACT EXTRACTION PHACO AND INTRAOCULAR LENS PLACEMENT ([place])  LEFT;  Surgeon: [name], MD;  Location: [place];  Service: Ophthalmology;  Laterality: Left;  diabetic - diet controlled  . CESAREAN SECTION    . CHOLECYSTECTOMY    . COLONOSCOPY WITH PROPOFOL N/A 04/05/2017   Procedure: COLONOSCOPY  WITH PROPOFOL;  Surgeon: [name], MD;  Location: [facility] ENDOSCOPY;  Service: Endoscopy;  Laterality: N/A;  . ESOPHAGOGASTRODUODENOSCOPY (EGD) WITH PROPOFOL N/A 08/28/2016   Procedure: ESOPHAGOGASTRODUODENOSCOPY (EGD) WITH PROPOFOL;  Surgeon: [name], MD;  Location: [facility] ENDOSCOPY;  Service: Endoscopy;  Laterality: N/A;  . HERNIA REPAIR    . INGUINAL HERNIA REPAIR     rt and left  . lexiscan cardiolite    . TILT TABLE STUDY N/A 04/01/2012   Procedure: TILT TABLE STUDY;  Surgeon: Deboraha Sprang, MD;  [facility];  Service: Cardiovascular;  Laterality: N/A;   Family History:  Family History  Problem Relation Age of Onset  . Colon cancer Father   . Stomach cancer Father   . Hypertension Mother   . Hyperlipidemia Mother   . Alcohol abuse Brother   . Irritable bowel syndrome Sister   . Breast cancer Paternal Aunt   . Ovarian cancer Paternal Aunt   . Kidney cancer Neg Hx   . Kidney disease Neg Hx   . Prostate cancer Neg Hx    Family Psychiatric  History: See H&P Social History:  Social History   Substance and Sexual Activity  Alcohol Use No     Social History   Substance and Sexual Activity  Drug Use No    Social History   Socioeconomic History  . Marital status: Married  Spouse name: Not on file  . Number of children: Not on file  . Years of education: Not on file  . Highest education level: Not on file  Occupational History  . Occupation: disabled  Social Needs  . Financial resource strain: Not on file  . Food insecurity    Worry: Not on file    Inability: Not on file  . Transportation needs    Medical: Not on file    Non-medical: Not on file  Tobacco Use  . Smoking status: Former Smoker    Types: Cigarettes    Quit date: 2015    Years since quitting: 5.5  . Smokeless tobacco: Never Used  Substance and Sexual Activity  . Alcohol use: No  . Drug use: No  . Sexual activity: Yes  Lifestyle  . Physical activity    Days per week:  Not on file    Minutes per session: Not on file  . Stress: Not on file  Relationships  . Social Musicianconnections    Talks on phone: Not on file    Gets together: Not on file    Attends religious service: Not on file    Active member of club or organization: Not on file    Attends meetings of clubs or organizations: Not on file    Relationship status: Not on file  Other Topics Concern  . Not on file  Social History Narrative  . Not on file   Hospital Course: (Per Md's admission evaluation): Patient is a 53 year old female who originally presented to the [facility] emergency department with her husband on 05/19/2019. It should be noted the evaluation was done by telephone because of problems with communication equipment. The patient stated to me that she was fine and that she wanted to leave. She apparently had presented to the emergency department secondary to feeling severely depressed on the date of admission. She stated that she had "had enough". She apparently had ingested an unknown quantity of unknown medications. Her husband thought that it was a combination of Clonazepam as well as an unspecified antipsychotic medication. The patient stated that this was not true. The husband told staff that he was talking with the patient, and she apparently at this point had become increasingly lethargic, and was unsure of what medication she had taken. The patient stated that she is unable to remember many things. She stated that she had ECT for approximately 2 years, and she has forgotten many things in her life including her wedding. She told the staff in the [facility] emergency room that she did not even realize she was in the emergency room until 10 minutes after she had been there. She admitted that she is under significant stress. She stated that her and her husband are trying to sell their house, and that her husband works for her son, and there were guarantees there that were not fulfilled.  She stated that she been followed by Dr. Lyn HollingsheadKaurfor 12 years, and during that time. Had been on multiple medications that were not effective, and that throughout her life she had continued to have auditory hallucinations as well as intermittent problems with significant insomnia. She has a history of migraine headaches, and takes a massive quantity of Topamax which dosage was confirmed by care everywhere on the last neurology note. She stated that she had also been previously treated with lithium but that had led to kidney problems, and that previously Zyprexa and Seroquel did not assist with her sleep issues  at all. She denied ever having taken Tegretol in the past. She stated that she experiences auditory hallucinations on a daily basis. The husband felt as though that she had done poorly since she had started Geodon approximately a month ago. The patient denied this, and on admission last night basically demanded that she received the Geodon. Her last psychiatric hospitalization was over 12 years ago at [facility]. She was admitted to the hospital for evaluation and stabilization.  Shea was admitted to the [facility] adult unit for crisis management due to worsening symptoms of Bipolar disorder & suspected drug overdose. After evaluation of her symptoms, her treatment regimen was recommended with their indications & side effects explained to the patient. Her other pre-existing medical problems were identified & treated accordingly by resuming her home medications as deemed appropriate.  During the course of her hosptalization, Nyliah's improvement was monitored by observation & her daily report of symptom reduction noted.  Her emotional & mental status were monitored by daily self-inventory reports completed by her & the clinical staff. She reported continued improvement on daily basis & denied any new concerns. She was encouraged to attend group sessions to help with recognizing the  triggers of her emotional crises & ways to cope with them.      Zaley was evaluated by the treatment team on daily basis for mood stability and plans for continued recovery after discharge. She was offered further treatment options upon discharge on an outpatient basis as noted below.  She was encouraged to maintain satisfactory support network and home environment as these will help her achieve maximum stability. She was instructed & encouraged to adhere to her medication regimen as recommended by her treatment team.     Upon discharge, Kassadi was both mentally and medically stable denying any suicidal/homicidal ideation, auditory/visual/tactile hallucinations, delusional thoughts and paranoia. She left [facility] with all personal belongings in no distress.  Transportation per her arrangement (husband).   Physical Findings: AIMS: Facial and Oral Movements Muscles of Facial Expression: None, normal Lips and Perioral Area: None, normal Jaw: None, normal Tongue: None, normal,Extremity Movements Upper (arms, wrists, hands, fingers): None, normal Lower (legs, knees, ankles, toes): None, normal, Trunk Movements Neck, shoulders, hips: None, normal, Overall Severity Severity of abnormal movements (highest score from questions above): None, normal Incapacitation due to abnormal movements: None, normal Patient's awareness of abnormal movements (rate only patient's report): No Awareness, Dental Status Current problems with teeth and/or dentures?: No Does patient usually wear dentures?: No  CIWA:    COWS:     Musculoskeletal: Strength & Muscle Tone: within normal limits Gait & Station: normal Patient leans: N/A  Psychiatric Specialty Exam: Physical Exam  Nursing note and vitals reviewed. Constitutional: She is oriented to person, place, and time. She appears well-developed.  HENT:  Head: Normocephalic.  Eyes: Pupils are equal, round, and reactive to light.  Neck: Normal range of motion.   Cardiovascular: Normal rate.  Respiratory: Effort normal.  GI: Soft.  Genitourinary:    Genitourinary Comments: Deferred   Musculoskeletal: Normal range of motion.  Neurological: She is alert and oriented to person, place, and time.  Skin: Skin is warm.    Review of Systems  Constitutional: Negative.   HENT: Negative.   Eyes: Negative.   Respiratory: Negative for cough and shortness of breath.   Cardiovascular: Negative for chest pain, palpitations and leg swelling.  Gastrointestinal: Negative for nausea and vomiting.  Genitourinary: Negative.   Skin: Negative.   Neurological: Negative for dizziness,  seizures and headaches.  Endo/Heme/Allergies: Negative.   Psychiatric/Behavioral: Positive for depression (Stabilized with medication prior to discharge) and substance abuse (Hx. Benzodizepine use disorder). Negative for hallucinations, memory loss and suicidal ideas. The patient has insomnia (Stabilized with medication prior to discharge). The patient is not nervous/anxious (Stable).     Blood pressure 94/74, pulse 88, temperature 97.7 F (36.5 C), temperature source Oral, resp. rate 16, height 5\' 4"  (1.626 m), weight 72.1 kg.Body mass index is 27.29 kg/m.  See Md's discharge SRA   Have you used any form of tobacco in the last 30 days? (Cigarettes, Smokeless Tobacco, Cigars, and/or Pipes): No  Has this patient used any form of tobacco in the last 30 days? (Cigarettes, Smokeless Tobacco, Cigars, and/or Pipes): N/A  Blood Alcohol level:  Lab Results  Component Value Date   ETH <10 05/19/2019   Metabolic Disorder Labs:  Lab Results  Component Value Date   HGBA1C 5.2 07/24/2016   MPG 103 07/24/2016   No results found for: PROLACTIN Lab Results  Component Value Date   CHOL 157 05/17/2014   TRIG 271 (H) 05/17/2014   HDL 29 (L) 05/17/2014   VLDL 54 (H) 05/17/2014   LDLCALC 74 05/17/2014   See Psychiatric Specialty Exam and Suicide Risk Assessment completed by Attending  Physician prior to discharge.  Discharge destination:  Home  Is patient on multiple antipsychotic therapies at discharge:  No   Has Patient had three or more failed trials of antipsychotic monotherapy by history:  No  Recommended Plan for Multiple Antipsychotic Therapies: NA  Discharge Instructions    Discharge instructions   Complete by: As directed    Patient is instructed to take all prescribed medications as recommended. Report any side effects or adverse reactions to your outpatient psychiatrist. Patient is instructed to abstain from alcohol and illegal drugs while on prescription medications. In the event of worsening symptoms, patient is instructed to call the crisis hotline, 911, or go to the nearest emergency department for evaluation and treatment.     Allergies as of 05/22/2019      Reactions   Cleocin [clindamycin Hcl] Other (See Comments)   GI distress      Medication List    STOP taking these medications   fish oil-omega-3 fatty acids 1000 MG capsule   multivitamin with minerals Tabs tablet   pyridOXINE 100 MG tablet Commonly known as: VITAMIN B-6     TAKE these medications     Indication  ALPRAZolam 1 MG tablet Commonly known as: XANAX Take 1 mg by mouth 3 (three) times daily as needed for anxiety or sleep.  Indication: Feeling Anxious   Armodafinil 250 MG tablet Take 250 mg by mouth daily.  Indication: Depression   atorvastatin 40 MG tablet Commonly known as: LIPITOR Take 40 mg by mouth at bedtime.  Indication: High Amount of Fats in the Blood   benztropine 1 MG tablet Commonly known as: COGENTIN Take 1 mg by mouth 2 (two) times daily.  Indication: Extrapyramidal Reaction caused by Medications   buPROPion 300 MG 24 hr tablet Commonly known as: WELLBUTRIN XL Take 300 mg by mouth daily.  Indication: Major Depressive Disorder   docusate sodium 100 MG capsule Commonly known as: COLACE Take 100 mg by mouth 2 (two) times daily.  Indication:  Constipation   furosemide 20 MG tablet Commonly known as: LASIX Take 20 mg by mouth daily.  Indication: Edema   levothyroxine 100 MCG tablet Commonly known as: SYNTHROID Take 100 mcg by  mouth daily before breakfast. Takes at bedtime  Indication: Underactive Thyroid   liothyronine 5 MCG tablet Commonly known as: CYTOMEL Take 5 mcg by mouth 2 (two) times daily.  Indication: Depression   MELATONIN PO Take 10 mg by mouth at bedtime.  Indication: insomnia   methocarbamol 750 MG tablet Commonly known as: ROBAXIN Take 750 mg by mouth as needed for muscle spasms.  Indication: Musculoskeletal Pain   Ospemifene 60 MG Tabs Commonly known as: Osphena Take 1 tablet by mouth daily.  Indication: Atrophic Vaginitis, Dyspareunia   pantoprazole 40 MG tablet Commonly known as: PROTONIX Take 40 mg by mouth daily.  Indication: Gastroesophageal Reflux Disease   primidone 50 MG tablet Commonly known as: MYSOLINE Take 50-100 mg by mouth See admin instructions. 50 mg daily(breakfast) , 100 mg at bedtime  Indication: Fine to Coarse Slow Tremor affecting Head, Hands & Voice   topiramate 200 MG tablet Commonly known as: TOPAMAX Take 300-400 mg by mouth See admin instructions. pt take 2 tabs (400 mg) q am, and 1.5 (300 mg) hs  Indication: Migraine Headache   ziprasidone 60 MG capsule Commonly known as: GEODON Take 180 mg by mouth every evening. After dinner  Indication: Major Depressive Disorder      Follow-up Information    Milagros EvenerKaur, Rupinder, MD Follow up on 05/25/2019.   Specialty: Psychiatry Why: Your hospital discharge appointment is on Thursday, August 6th at 12:15pm. It is a virtual visit with Dr. Evelene CroonKaur. You will receive a phone call at 12:15pm.  Contact information: [address] [address] [place] [address] [phone]        BEHAVIORAL HEALTH PARTIAL HOSPITALIZATION PROGRAM. Go on 05/23/2019.   Specialty: Behavioral Health Why: Your [facility] appointment for  PHP services is Tuesday, 05/23/2019 at 10:00am. You will receive a WebEx invite for the your initial assessment. Please be sure to have a secure internet connection for telehealth services due to COVID-19.  Contact information: [address] [id number] mc [place] [id number] [phone]         Follow-up recommendations: Activity:  As tolerated Diet: As recommended by your primary care doctor. Keep all scheduled follow-up appointments as recommended.  Comments: Patient is instructed prior to discharge to: Take all medications as prescribed by his/her mental healthcare provider. Report any adverse effects and or reactions from the medicines to his/her outpatient provider promptly. Patient has been instructed & cautioned: To not engage in alcohol and or illegal drug use while on prescription medicines. In the event of worsening symptoms, patient is instructed to call the crisis hotline, 911 and or go to the nearest ED for appropriate evaluation and treatment of symptoms. To follow-up with his/her primary care provider for your other medical issues, concerns and or health care needs.   Signed: Armandina StammerAgnes , NP, PMHNP, FNP-BC 05/22/2019, 11:31 AM

## 2019-05-22 NOTE — Progress Notes (Signed)
Patient ID: Jennifer Bright, female   DOB: 1965/10/29, 53 y.o.   MRN: [id number] D: Patient observed in dayroom watching TV and interacting well with peers. Pt mood and affect appears depressed and flat. Pt complained about her medications making her drowsy. Denies  SI/HI/AVH and pain.No behavioral issues noted.  A: Support and encouragement offered as needed to express needs. Medications administered as prescribed.  R: Patient is safe and cooperative on unit. Will continue to monitor  for safety and stability.

## 2019-05-22 NOTE — Plan of Care (Signed)
Discharge note  Patient verbalizes readiness for discharge. Follow up plan explained, AVS, Transition record and SRA given. Prescriptions and teaching provided. Belongings returned and signed for. Suicide safety plan completed and signed. Patient verbalizes understanding. Patient denies SI/HI and assures this writer she will seek assistance should that change. Patient discharged to lobby where husband was waiting.  Problem: Education: Goal: Knowledge of  General Education information/materials will improve Outcome: Adequate for Discharge Goal: Emotional status will improve Outcome: Adequate for Discharge Goal: Mental status will improve Outcome: Adequate for Discharge Goal: Verbalization of understanding the information provided will improve Outcome: Adequate for Discharge   Problem: Activity: Goal: Interest or engagement in activities will improve Outcome: Adequate for Discharge Goal: Sleeping patterns will improve Outcome: Adequate for Discharge   Problem: Coping: Goal: Ability to verbalize frustrations and anger appropriately will improve Outcome: Adequate for Discharge Goal: Ability to demonstrate self-control will improve Outcome: Adequate for Discharge   Problem: Health Behavior/Discharge Planning: Goal: Identification of resources available to assist in meeting health care needs will improve Outcome: Adequate for Discharge Goal: Compliance with treatment plan for underlying cause of condition will improve Outcome: Adequate for Discharge   Problem: Physical Regulation: Goal: Ability to maintain clinical measurements within normal limits will improve Outcome: Adequate for Discharge   Problem: Safety: Goal: Periods of time without injury will increase Outcome: Adequate for Discharge   Problem: Education: Goal: Ability to make informed decisions regarding treatment will improve Outcome: Adequate for Discharge   Problem: Coping: Goal: Coping ability will  improve Outcome: Adequate for Discharge   Problem: Health Behavior/Discharge Planning: Goal: Identification of resources available to assist in meeting health care needs will improve Outcome: Adequate for Discharge   Problem: Medication: Goal: Compliance with prescribed medication regimen will improve Outcome: Adequate for Discharge   Problem: Self-Concept: Goal: Ability to disclose and discuss suicidal ideas will improve Outcome: Adequate for Discharge Goal: Will verbalize positive feelings about self Outcome: Adequate for Discharge   Problem: Education: Goal: Knowledge of disease or condition will improve Outcome: Adequate for Discharge Goal: Understanding of discharge needs will improve Outcome: Adequate for Discharge   Problem: Health Behavior/Discharge Planning: Goal: Ability to identify changes in lifestyle to reduce recurrence of condition will improve Outcome: Adequate for Discharge Goal: Identification of resources available to assist in meeting health care needs will improve Outcome: Adequate for Discharge   Problem: Physical Regulation: Goal: Complications related to the disease process, condition or treatment will be avoided or minimized Outcome: Adequate for Discharge   Problem: Safety: Goal: Ability to remain free from injury will improve Outcome: Adequate for Discharge

## 2019-05-22 NOTE — BHH Suicide Risk Assessment (Signed)
[facility] Discharge Suicide Risk Assessment   Principal Problem: <principal problem not specified> Discharge Diagnoses: Active Problems:   Bipolar disorder ([place])   Total Time spent with patient: 30 minutes  Musculoskeletal: Strength & Muscle Tone: within normal limits Gait & Station: normal Patient leans: N/A  Psychiatric Specialty Exam: Review of Systems  All other systems reviewed and are negative.   Blood pressure 94/74, pulse 88, temperature 97.7 F (36.5 C), temperature source Oral, resp. rate 16, height 5\' 4"  (1.626 m), weight 72.1 kg.Body mass index is 27.29 kg/m.  General Appearance: Casual  Eye Contact::  Good  Speech:  Normal Rate409  Volume:  Increased  Mood:  Anxious  Affect:  Congruent  Thought Process:  Coherent and Descriptions of Associations: Intact  Orientation:  Full (Time, Place, and Person)  Thought Content:  Hallucinations: Auditory  Suicidal Thoughts:  No  Homicidal Thoughts:  No  Memory:  Immediate;   Fair Recent;   Fair Remote;   Fair  Judgement:  Intact  Insight:  Fair  Psychomotor Activity:  Increased  Concentration:  Fair  Recall:  [employer] of Knowledge:Fair  Language: Fair  Akathisia:  Negative  Handed:  Right  AIMS (if indicated):     Assets:  Desire for Improvement Resilience  Sleep:  Number of Hours: 5.5  Cognition: WNL  ADL's:  Intact   Mental Status Per Nursing Assessment::   On Admission:  Suicidal ideation indicated by others  Demographic Factors:  Caucasian and Unemployed  Loss Factors: Financial problems/change in socioeconomic status  Historical Factors: Impulsivity  Risk Reduction Factors:   Sense of responsibility to family, Living with another person, especially a relative, Positive social support and Positive therapeutic relationship  Continued Clinical Symptoms:  Bipolar Disorder:   Mixed State  Cognitive Features That Contribute To Risk:  Thought constriction (tunnel vision)    Suicide Risk:  Minimal:  No identifiable suicidal ideation.  Patients presenting with no risk factors but with morbid ruminations; may be classified as minimal risk based on the severity of the depressive symptoms  Follow-up Information    Chucky May, MD Follow up.   Specialty: Psychiatry Why: Appointment needed Contact information: [address]  [place] [address] [phone]           Plan Of Care/Follow-up recommendations:  Activity:  ad lib  Sharma Covert, MD 05/22/2019, 9:15 AM

## 2019-05-22 NOTE — Tx Team (Signed)
Interdisciplinary Treatment and Diagnostic Plan Update  05/22/2019 Time of Session:  Jennifer Bright MRN: [id number]  Principal Diagnosis: <principal problem not specified>  Secondary Diagnoses: Active Problems:   Bipolar disorder (HCC)   Current Medications:  Current Facility-Administered Medications  Medication Dose Route Frequency Provider Last Rate Last Dose  . acetaminophen (TYLENOL) tablet 650 mg  650 mg Oral Q6H PRN Antonieta Pertlary, Greg Lawson, MD      . ALPRAZolam Prudy Feeler(XANAX) tablet 1 mg  1 mg Oral TID PRN Antonieta Pertlary, Greg Lawson, MD      . alum & mag hydroxide-simeth (MAALOX/MYLANTA) 200-200-20 MG/5ML suspension 30 mL  30 mL Oral Q4H PRN Antonieta Pertlary, Greg Lawson, MD      . atorvastatin (LIPITOR) tablet 40 mg  40 mg Oral q1800 Antonieta Pertlary, Greg Lawson, MD   40 mg at 05/21/19 1708  . benztropine (COGENTIN) tablet 1 mg  1 mg Oral BID Antonieta Pertlary, Greg Lawson, MD   1 mg at 05/22/19 0747  . buPROPion (WELLBUTRIN XL) 24 hr tablet 300 mg  300 mg Oral Daily Antonieta Pertlary, Greg Lawson, MD   300 mg at 05/22/19 0747  . docusate sodium (COLACE) capsule 100 mg  100 mg Oral BID Antonieta Pertlary, Greg Lawson, MD   100 mg at 05/22/19 0747  . enalapril (VASOTEC) tablet 2.5 mg  2.5 mg Oral Daily Antonieta Pertlary, Greg Lawson, MD   2.5 mg at 05/22/19 0745  . escitalopram (LEXAPRO) tablet 10 mg  10 mg Oral Daily Antonieta Pertlary, Greg Lawson, MD   10 mg at 05/22/19 0747  . furosemide (LASIX) tablet 20 mg  20 mg Oral Daily Antonieta Pertlary, Greg Lawson, MD   20 mg at 05/22/19 0746  . hydrOXYzine (ATARAX/VISTARIL) tablet 25 mg  25 mg Oral TID PRN Antonieta Pertlary, Greg Lawson, MD      . levETIRAcetam (KEPPRA) tablet 500 mg  500 mg Oral BID Antonieta Pertlary, Greg Lawson, MD   500 mg at 05/22/19 0745  . levothyroxine (SYNTHROID) tablet 100 mcg  100 mcg Oral Q0600 Antonieta Pertlary, Greg Lawson, MD   100 mcg at 05/22/19 0636  . liothyronine (CYTOMEL) tablet 5 mcg  5 mcg Oral Daily Antonieta Pertlary, Greg Lawson, MD   5 mcg at 05/22/19 0747  . magnesium hydroxide (MILK OF MAGNESIA) suspension 30 mL  30 mL Oral Daily PRN Antonieta Pertlary, Greg  Lawson, MD      . Melatonin TABS 10 mg  10 mg Oral QHS Antonieta Pertlary, Greg Lawson, MD   10 mg at 05/21/19 2129  . methocarbamol (ROBAXIN) tablet 750 mg  750 mg Oral Q6H PRN Antonieta Pertlary, Greg Lawson, MD      . modafinil (PROVIGIL) tablet 100 mg  100 mg Oral Daily Antonieta Pertlary, Greg Lawson, MD   100 mg at 05/22/19 0750  . pantoprazole (PROTONIX) EC tablet 40 mg  40 mg Oral Daily Antonieta Pertlary, Greg Lawson, MD   40 mg at 05/22/19 0746  . primidone (MYSOLINE) tablet 50 mg  50 mg Oral Daily Antonieta Pertlary, Greg Lawson, MD   50 mg at 05/22/19 0746   And  . primidone (MYSOLINE) tablet 100 mg  100 mg Oral QHS Antonieta Pertlary, Greg Lawson, MD   100 mg at 05/21/19 2129  . topiramate (TOPAMAX) tablet 300 mg  300 mg Oral Daily Antonieta Pertlary, Greg Lawson, MD   300 mg at 05/22/19 0746  . topiramate (TOPAMAX) tablet 400 mg  400 mg Oral QHS Antonieta Pertlary, Greg Lawson, MD   400 mg at 05/21/19 2129  . traZODone (DESYREL) tablet 50 mg  50 mg Oral QHS PRN Landry Mellowlary, Greg  Kellie Simmering, MD   50 mg at 05/20/19 2155  . ziprasidone (GEODON) capsule 180 mg  180 mg Oral Q supper Sharma Covert, MD   180 mg at 05/21/19 1708   PTA Medications: Medications Prior to Admission  Medication Sig Dispense Refill Last Dose  . ALPRAZolam (XANAX) 1 MG tablet Take 1 mg by mouth 3 (three) times daily as needed for anxiety or sleep.     . Armodafinil 250 MG tablet Take 250 mg by mouth daily.     Marland Kitchen atorvastatin (LIPITOR) 40 MG tablet Take 40 mg by mouth at bedtime.      . benztropine (COGENTIN) 1 MG tablet Take 1 mg by mouth 2 (two) times daily.      Marland Kitchen buPROPion (WELLBUTRIN XL) 300 MG 24 hr tablet Take 300 mg by mouth daily.     Marland Kitchen docusate sodium (COLACE) 100 MG capsule Take 100 mg by mouth 2 (two) times daily.      . fish oil-omega-3 fatty acids 1000 MG capsule Take 1 g by mouth daily.      . furosemide (LASIX) 20 MG tablet Take 20 mg by mouth daily.     Marland Kitchen levothyroxine (SYNTHROID, LEVOTHROID) 100 MCG tablet Take 100 mcg by mouth daily before breakfast. Takes at bedtime     . liothyronine (CYTOMEL) 5  MCG tablet Take 5 mcg by mouth 2 (two) times daily.      . methocarbamol (ROBAXIN) 750 MG tablet Take 750 mg by mouth as needed for muscle spasms.     . Multiple Vitamin (MULITIVITAMIN WITH MINERALS) TABS Take 1 tablet by mouth daily.     . Nutritional Supplements (MELATONIN PO) Take 10 mg by mouth at bedtime.      . Ospemifene (OSPHENA) 60 MG TABS Take 1 tablet by mouth daily. 30 tablet 11   . pantoprazole (PROTONIX) 40 MG tablet Take 40 mg by mouth daily.     . primidone (MYSOLINE) 50 MG tablet Take 50-100 mg by mouth See admin instructions. 50 mg daily(breakfast) , 100 mg at bedtime     . pyridOXINE (VITAMIN B-6) 100 MG tablet Take 100 mg by mouth daily.     Marland Kitchen topiramate (TOPAMAX) 200 MG tablet Take 300-400 mg by mouth See admin instructions. pt take 2 tabs (400 mg) q am, and 1.5 (300 mg) hs     . ziprasidone (GEODON) 60 MG capsule Take 180 mg by mouth every evening. After dinner       Patient Stressors:    Patient Strengths:    Treatment Modalities: Medication Management, Group therapy, Case management,  1 to 1 session with clinician, Psychoeducation, Recreational therapy.   Physician Treatment Plan for Primary Diagnosis: <principal problem not specified> Long Term Goal(s): Improvement in symptoms so as ready for discharge Improvement in symptoms so as ready for discharge   Short Term Goals: Ability to identify changes in lifestyle to reduce recurrence of condition will improve Ability to verbalize feelings will improve Ability to disclose and discuss suicidal ideas Ability to demonstrate self-control will improve Ability to identify and develop effective coping behaviors will improve Ability to maintain clinical measurements within normal limits will improve Compliance with prescribed medications will improve Ability to identify changes in lifestyle to reduce recurrence of condition will improve Ability to verbalize feelings will improve Ability to disclose and discuss suicidal  ideas Ability to demonstrate self-control will improve Ability to identify and develop effective coping behaviors will improve Ability to maintain clinical measurements within normal limits  will improve Compliance with prescribed medications will improve  Medication Management: Evaluate patient's response, side effects, and tolerance of medication regimen.  Therapeutic Interventions: 1 to 1 sessions, Unit Group sessions and Medication administration.  Evaluation of Outcomes: Adequate for Discharge  Physician Treatment Plan for Secondary Diagnosis: Active Problems:   Bipolar disorder (HCC)  Long Term Goal(s): Improvement in symptoms so as ready for discharge Improvement in symptoms so as ready for discharge   Short Term Goals: Ability to identify changes in lifestyle to reduce recurrence of condition will improve Ability to verbalize feelings will improve Ability to disclose and discuss suicidal ideas Ability to demonstrate self-control will improve Ability to identify and develop effective coping behaviors will improve Ability to maintain clinical measurements within normal limits will improve Compliance with prescribed medications will improve Ability to identify changes in lifestyle to reduce recurrence of condition will improve Ability to verbalize feelings will improve Ability to disclose and discuss suicidal ideas Ability to demonstrate self-control will improve Ability to identify and develop effective coping behaviors will improve Ability to maintain clinical measurements within normal limits will improve Compliance with prescribed medications will improve     Medication Management: Evaluate patient's response, side effects, and tolerance of medication regimen.  Therapeutic Interventions: 1 to 1 sessions, Unit Group sessions and Medication administration.  Evaluation of Outcomes: Adequate for Discharge   RN Treatment Plan for Primary Diagnosis: <principal problem not  specified> Long Term Goal(s): Knowledge of disease and therapeutic regimen to maintain health will improve  Short Term Goals: Ability to demonstrate self-control, Ability to participate in decision making will improve, Ability to verbalize feelings will improve, Ability to disclose and discuss suicidal ideas and Ability to identify and develop effective coping behaviors will improve  Medication Management: RN will administer medications as ordered by provider, will assess and evaluate patient's response and provide education to patient for prescribed medication. RN will report any adverse and/or side effects to prescribing provider.  Therapeutic Interventions: 1 on 1 counseling sessions, Psychoeducation, Medication administration, Evaluate responses to treatment, Monitor vital signs and CBGs as ordered, Perform/monitor CIWA, COWS, AIMS and Fall Risk screenings as ordered, Perform wound care treatments as ordered.  Evaluation of Outcomes: Adequate for Discharge   LCSW Treatment Plan for Primary Diagnosis: <principal problem not specified> Long Term Goal(s): Safe transition to appropriate next level of care at discharge, Engage patient in therapeutic group addressing interpersonal concerns.  Short Term Goals: Engage patient in aftercare planning with referrals and resources  Therapeutic Interventions: Assess for all discharge needs, 1 to 1 time with Social worker, Explore available resources and support systems, Assess for adequacy in community support network, Educate family and significant other(s) on suicide prevention, Complete Psychosocial Assessment, Interpersonal group therapy.  Evaluation of Outcomes: Adequate for Discharge   Progress in Treatment: Attending groups: No. Participating in groups: No. Taking medication as prescribed: Yes. Toleration medication: Yes. Family/Significant other contact made: No, will contact:  the patient's spouse  Patient understands diagnosis:  Yes. Discussing patient identified problems/goals with staff: Yes. Medical problems stabilized or resolved: Yes. Denies suicidal/homicidal ideation: Yes. Issues/concerns per patient self-inventory: No. Other:   New problem(s) identified: None   New Short Term/Long Term Goal(s):medication stabilization, elimination of SI thoughts, development of comprehensive mental wellness plan.    Patient Goals:    Discharge Plan or Barriers: Patient discharged home with her spouse. She plans to follow up with Dr. Evelene CroonKaur for medication management and [facility].   Reason for Continuation  of Hospitalization: None   Estimated Length of Stay: 05/23/19  Attendees: Patient: 05/22/2019 8:46 AM  Physician: Dr. Landry MellowGreg Clary, MD 05/22/2019 8:46 AM  Nursing: Casimiro [email or web address] RhodesS, RN 05/22/2019 8:46 AM  RN Care Manager: 05/22/2019 8:46 AM  Social Worker: Baldo DaubJolan Loria Lacina, LCSWA 05/22/2019 8:46 AM  Recreational Therapist:  05/22/2019 8:46 AM  Other:  05/22/2019 8:46 AM  Other:  05/22/2019 8:46 AM  Other: 05/22/2019 8:46 AM    Scribe for Treatment Team: Maeola SarahJolan E Lyam Provencio, LCSWA 05/22/2019 8:46 AM

## 2019-05-23 ENCOUNTER — Telehealth (HOSPITAL_COMMUNITY): Payer: Self-pay | Admitting: Professional

## 2019-05-23 ENCOUNTER — Ambulatory Visit (HOSPITAL_COMMUNITY): Payer: Medicare Other

## 2019-05-23 NOTE — Progress Notes (Signed)
Pt's controlled medication wasn't wasted before she discharged and taken out of the pyxis. This Probation officer collaborated with the pharmacist to add her back into the pyxis so as to waste afterward. This Probation officer and pharmacist unable to do so. Pharmacist advised this writer to place note in chart after wasting. Pharmacist witnessed waste of medication on [address].

## 2019-05-23 NOTE — Telephone Encounter (Signed)
Cln called pt to f/u on pw for today's appointment. Pt reports she was told to fax the pw and it would not go through. Pt unable to provide number she was trying to fax pw to. Cln provided cln email and asked pt to take pictures and email. Pt reports her computer will not be delivered until this afternoon so pt wants to reschedule. Pt is rescheduled for Wednesday, 8/5 @ 11a. Pt agrees. Pt denies current SI/HI/AVH.

## 2019-05-24 ENCOUNTER — Other Ambulatory Visit: Payer: Self-pay

## 2019-05-24 ENCOUNTER — Other Ambulatory Visit (HOSPITAL_COMMUNITY): Payer: Medicare Other | Attending: Family | Admitting: Licensed Clinical Social Worker

## 2019-05-24 DIAGNOSIS — F419 Anxiety disorder, unspecified: Secondary | ICD-10-CM | POA: Diagnosis not present

## 2019-05-24 DIAGNOSIS — K219 Gastro-esophageal reflux disease without esophagitis: Secondary | ICD-10-CM | POA: Insufficient documentation

## 2019-05-24 DIAGNOSIS — F315 Bipolar disorder, current episode depressed, severe, with psychotic features: Secondary | ICD-10-CM

## 2019-05-24 DIAGNOSIS — N183 Chronic kidney disease, stage 3 (moderate): Secondary | ICD-10-CM | POA: Insufficient documentation

## 2019-05-24 DIAGNOSIS — Z9842 Cataract extraction status, left eye: Secondary | ICD-10-CM | POA: Diagnosis not present

## 2019-05-24 DIAGNOSIS — Z811 Family history of alcohol abuse and dependence: Secondary | ICD-10-CM | POA: Diagnosis not present

## 2019-05-24 DIAGNOSIS — Z881 Allergy status to other antibiotic agents status: Secondary | ICD-10-CM | POA: Insufficient documentation

## 2019-05-24 DIAGNOSIS — R4589 Other symptoms and signs involving emotional state: Secondary | ICD-10-CM | POA: Diagnosis not present

## 2019-05-24 DIAGNOSIS — Z8249 Family history of ischemic heart disease and other diseases of the circulatory system: Secondary | ICD-10-CM | POA: Diagnosis not present

## 2019-05-24 DIAGNOSIS — G250 Essential tremor: Secondary | ICD-10-CM | POA: Insufficient documentation

## 2019-05-24 DIAGNOSIS — E039 Hypothyroidism, unspecified: Secondary | ICD-10-CM | POA: Insufficient documentation

## 2019-05-24 DIAGNOSIS — E1122 Type 2 diabetes mellitus with diabetic chronic kidney disease: Secondary | ICD-10-CM | POA: Diagnosis not present

## 2019-05-24 DIAGNOSIS — Z79899 Other long term (current) drug therapy: Secondary | ICD-10-CM | POA: Insufficient documentation

## 2019-05-24 DIAGNOSIS — Z8041 Family history of malignant neoplasm of ovary: Secondary | ICD-10-CM | POA: Diagnosis not present

## 2019-05-24 DIAGNOSIS — G4730 Sleep apnea, unspecified: Secondary | ICD-10-CM | POA: Diagnosis not present

## 2019-05-24 DIAGNOSIS — Z87891 Personal history of nicotine dependence: Secondary | ICD-10-CM | POA: Diagnosis not present

## 2019-05-24 DIAGNOSIS — Z79890 Hormone replacement therapy (postmenopausal): Secondary | ICD-10-CM | POA: Insufficient documentation

## 2019-05-24 DIAGNOSIS — D869 Sarcoidosis, unspecified: Secondary | ICD-10-CM | POA: Insufficient documentation

## 2019-05-24 DIAGNOSIS — E785 Hyperlipidemia, unspecified: Secondary | ICD-10-CM | POA: Diagnosis not present

## 2019-05-24 DIAGNOSIS — E669 Obesity, unspecified: Secondary | ICD-10-CM | POA: Insufficient documentation

## 2019-05-24 DIAGNOSIS — Z961 Presence of intraocular lens: Secondary | ICD-10-CM | POA: Insufficient documentation

## 2019-05-24 DIAGNOSIS — Z9841 Cataract extraction status, right eye: Secondary | ICD-10-CM | POA: Diagnosis not present

## 2019-05-24 DIAGNOSIS — Z800 Family history of malignant neoplasm of digestive organs: Secondary | ICD-10-CM | POA: Diagnosis not present

## 2019-05-24 DIAGNOSIS — G2581 Restless legs syndrome: Secondary | ICD-10-CM | POA: Diagnosis not present

## 2019-05-24 DIAGNOSIS — K589 Irritable bowel syndrome without diarrhea: Secondary | ICD-10-CM | POA: Insufficient documentation

## 2019-05-25 ENCOUNTER — Other Ambulatory Visit (HOSPITAL_COMMUNITY): Payer: Medicare Other | Admitting: Licensed Clinical Social Worker

## 2019-05-25 ENCOUNTER — Other Ambulatory Visit (HOSPITAL_COMMUNITY): Payer: Medicare Other | Admitting: Occupational Therapy

## 2019-05-25 ENCOUNTER — Other Ambulatory Visit: Payer: Self-pay

## 2019-05-25 ENCOUNTER — Telehealth (HOSPITAL_COMMUNITY): Payer: Self-pay | Admitting: Professional

## 2019-05-25 DIAGNOSIS — E039 Hypothyroidism, unspecified: Secondary | ICD-10-CM | POA: Diagnosis not present

## 2019-05-25 DIAGNOSIS — G2581 Restless legs syndrome: Secondary | ICD-10-CM | POA: Diagnosis not present

## 2019-05-25 DIAGNOSIS — F419 Anxiety disorder, unspecified: Secondary | ICD-10-CM | POA: Diagnosis not present

## 2019-05-25 DIAGNOSIS — Z811 Family history of alcohol abuse and dependence: Secondary | ICD-10-CM | POA: Diagnosis not present

## 2019-05-25 DIAGNOSIS — F315 Bipolar disorder, current episode depressed, severe, with psychotic features: Secondary | ICD-10-CM | POA: Diagnosis not present

## 2019-05-25 DIAGNOSIS — E785 Hyperlipidemia, unspecified: Secondary | ICD-10-CM | POA: Diagnosis not present

## 2019-05-25 DIAGNOSIS — Z9842 Cataract extraction status, left eye: Secondary | ICD-10-CM | POA: Diagnosis not present

## 2019-05-25 DIAGNOSIS — Z8249 Family history of ischemic heart disease and other diseases of the circulatory system: Secondary | ICD-10-CM | POA: Diagnosis not present

## 2019-05-25 DIAGNOSIS — N183 Chronic kidney disease, stage 3 (moderate): Secondary | ICD-10-CM | POA: Diagnosis not present

## 2019-05-25 DIAGNOSIS — K589 Irritable bowel syndrome without diarrhea: Secondary | ICD-10-CM | POA: Diagnosis not present

## 2019-05-25 DIAGNOSIS — D869 Sarcoidosis, unspecified: Secondary | ICD-10-CM | POA: Diagnosis not present

## 2019-05-25 DIAGNOSIS — Z961 Presence of intraocular lens: Secondary | ICD-10-CM | POA: Diagnosis not present

## 2019-05-25 DIAGNOSIS — Z79890 Hormone replacement therapy (postmenopausal): Secondary | ICD-10-CM | POA: Diagnosis not present

## 2019-05-25 DIAGNOSIS — G4730 Sleep apnea, unspecified: Secondary | ICD-10-CM | POA: Diagnosis not present

## 2019-05-25 DIAGNOSIS — E1122 Type 2 diabetes mellitus with diabetic chronic kidney disease: Secondary | ICD-10-CM | POA: Diagnosis not present

## 2019-05-25 DIAGNOSIS — Z881 Allergy status to other antibiotic agents status: Secondary | ICD-10-CM | POA: Diagnosis not present

## 2019-05-25 DIAGNOSIS — Z800 Family history of malignant neoplasm of digestive organs: Secondary | ICD-10-CM | POA: Diagnosis not present

## 2019-05-25 DIAGNOSIS — R4589 Other symptoms and signs involving emotional state: Secondary | ICD-10-CM | POA: Diagnosis not present

## 2019-05-25 DIAGNOSIS — Z8041 Family history of malignant neoplasm of ovary: Secondary | ICD-10-CM | POA: Diagnosis not present

## 2019-05-25 DIAGNOSIS — Z87891 Personal history of nicotine dependence: Secondary | ICD-10-CM | POA: Diagnosis not present

## 2019-05-25 DIAGNOSIS — Z9841 Cataract extraction status, right eye: Secondary | ICD-10-CM | POA: Diagnosis not present

## 2019-05-25 DIAGNOSIS — Z79899 Other long term (current) drug therapy: Secondary | ICD-10-CM | POA: Diagnosis not present

## 2019-05-25 DIAGNOSIS — K219 Gastro-esophageal reflux disease without esophagitis: Secondary | ICD-10-CM | POA: Diagnosis not present

## 2019-05-25 DIAGNOSIS — G250 Essential tremor: Secondary | ICD-10-CM | POA: Diagnosis not present
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
## 2019-05-25 NOTE — Psych (Signed)
Virtual Visit via Video Note  I connected with Jennifer Bright on 05/25/19 at  9:00 AM EDT by a video enabled telemedicine application and verified that I am speaking with the correct person using two identifiers.   I discussed the limitations of evaluation and management by telemedicine and the availability of in person appointments. The patient expressed understanding and agreed to proceed.  I discussed the assessment and treatment plan with the patient. The patient was provided an opportunity to ask questions and all were answered. The patient agreed with the plan and demonstrated an understanding of the instructions.   The patient was advised to call back or seek an in-person evaluation if the symptoms worsen or if the condition fails to improve as anticipated.  I provided 10 minutes of non-face-to-face time during this encounter.  Pt verbally agrees to treatment plan.  [place], LCMHCA, LCASA

## 2019-05-26 ENCOUNTER — Other Ambulatory Visit (HOSPITAL_COMMUNITY): Payer: Medicare Other | Admitting: Occupational Therapy

## 2019-05-26 ENCOUNTER — Other Ambulatory Visit: Payer: Self-pay

## 2019-05-26 ENCOUNTER — Encounter (HOSPITAL_COMMUNITY): Payer: Self-pay | Admitting: Family

## 2019-05-26 ENCOUNTER — Other Ambulatory Visit (HOSPITAL_COMMUNITY): Payer: Medicare Other | Admitting: Licensed Clinical Social Worker

## 2019-05-26 DIAGNOSIS — F315 Bipolar disorder, current episode depressed, severe, with psychotic features: Secondary | ICD-10-CM | POA: Diagnosis not present

## 2019-05-26 DIAGNOSIS — R4589 Other symptoms and signs involving emotional state: Secondary | ICD-10-CM

## 2019-05-26 NOTE — Progress Notes (Signed)
Spoke with patient over the phone, she was unable to log in with Webex video call due to limited computer knowledge.Depressed mood. States she was inpatient at [facility] last week after taking "some pills." She says she does not deal well with stress and hears voices that talk to her and tell her to hurt herself and that they are coming to get her. Began crying on the phone stating that her family doesn't understand her nor do they try.Has no friends and sits at home all day alone. Has 4 kids and 7 grandkids that never come to see her or check on her unless they want something. Upset that she has to pay them to help her do anything around her house. She is unable to clarify her medications stating that her husband locks them up and gives them to her. She does not know what she takes but knows she started Geodon while inpatient. Feels the voices are not as bad since starting it but they are still there. She is paranoid about someone coming to hurt her and has 6 cameras outside around her house that she monitors constantly. Denies SI/HI currently. On scale 1-10 as 10 being worst she rates depression at 9 and anxiety at 5. PHQ9=17. Looks forward to the program and getting to talk with people. No issues or complaints.

## 2019-05-26 NOTE — Progress Notes (Signed)
Virtual Visit via Telephone Note  I connected with Jennifer Bright on 05/26/19 at  9:00 AM EDT by telephone and verified that I am speaking with the correct person using two identifiers.   I discussed the limitations, risks, security and privacy concerns of performing an evaluation and management service by telephone and the availability of in person appointments. I also discussed with the patient that there may be a patient responsible charge related to this service. The patient expressed understanding and agreed to proceed.    I discussed the assessment and treatment plan with the patient. The patient was provided an opportunity to ask questions and all were answered. The patient agreed with the plan and demonstrated an understanding of the instructions.   The patient was advised to call back or seek an in-person evaluation if the symptoms worsen or if the condition fails to improve as anticipated.  I provided 00 minutes of non-face-to-face time during this encounter.   Derrill Center, NP      Behavioral Health Partial Program Assessment Note  Date: 05/26/2019 Name: Jennifer Bright MRN: [id number]   FUX:NATFTDD Jennifer Bright  is a 53 y.o. Caucasian female presents with depression and anxiety with a suspected suicide attempt.  Jennifer Bright adamantly denying during this assessment.  "  I cannot remember, I just hears voices all the time and there getting worse." reported that she was recently started on Geodon for the auditory hallucinations.  Reported schedule follow-up with her attending psychiatrist Kura lastnight with a follow-up this morning.  She reports ongoing paranoia and delusions.  Currently denying suicidal or homicidal ideations. Patient was enrolled in partial psychiatric program on 05/26/19.  Primary complaints include: agitation, anxiety attacks and feeling depressed.  Onset of symptoms was gradual with gradually worsening course since that time. Psychosocial Stressors include  the following: family and financial.   Per discharge assessment note: Patient is a 53 year old female who originally presented to the [facility] emergency department with her husband on 05/19/2019. It should be noted the evaluation was done by telephone because of problems with communication equipment. The patient stated to me that she was fine and that she wanted to leave. She apparently had presented to the emergency department secondary to feeling severely depressed on the date of admission. She stated that she had "had enough". She apparently had ingested an unknown quantity of unknown medications. Her husband thought that it was a combination of Clonazepam as well as an unspecified antipsychotic medication. The patient stated that this was not true. The husband told staff that he was talking with the patient, and she apparently at this point had become increasingly lethargic, and was unsure of what medication she had taken. The patient stated that she is unable to remember many things. She stated that she had ECT for approximately 2 years, and she has forgotten many things in her life including her wedding.   I have reviewed the following documentation dated : past psychiatric history, past medical history and past social and family history  Complaints of Pain: headachesar Past Psychiatric History:  Past psychiatric hospitalizations   Currently in treatment with  genodon 60mg , cogention 1 mg bid, Wellbutrin 300 mg  Armodafinil 250mg  and  Xanax tid  Substance Abuse History: None- chart reviewed + barbiturates and benzodizapines Use of Alcohol: denied Use of Caffeine: denies use Use of over the counter:   Past Surgical History:  Procedure Laterality Date  . ABDOMINAL HYSTERECTOMY    . APPENDECTOMY    .  BLADDER SURGERY     bladder tact  . CATARACT EXTRACTION W/PHACO Right 09/21/2018   Procedure: CATARACT EXTRACTION PHACO AND INTRAOCULAR LENS PLACEMENT (IOC);  Surgeon: Nevada CraneKing, Bradley  Mark, MD;  Location: [facility] ORS;  Service: Ophthalmology;  Laterality: Right;  US  00:20 CDE 00.82 Fluid pack lot # [id number]  . CATARACT EXTRACTION W/PHACO Left 10/17/2018   Procedure: CATARACT EXTRACTION PHACO AND INTRAOCULAR LENS PLACEMENT (IOC)  LEFT;  Surgeon: Nevada CraneKing, Bradley Mark, MD;  [facility];  Service: Ophthalmology;  Laterality: Left;  diabetic - diet controlled  . CESAREAN SECTION    . CHOLECYSTECTOMY    . COLONOSCOPY WITH PROPOFOL N/A 04/05/2017   Procedure: COLONOSCOPY WITH PROPOFOL;  Surgeon: Christena DeemSkulskie, Martin U, MD;  [facility];  Service: Endoscopy;  Laterality: N/A;  . ESOPHAGOGASTRODUODENOSCOPY (EGD) WITH PROPOFOL N/A 08/28/2016   Procedure: ESOPHAGOGASTRODUODENOSCOPY (EGD) WITH PROPOFOL;  Surgeon: [name], MD;  [facility];  Service: Endoscopy;  Laterality: N/A;  . HERNIA REPAIR    . INGUINAL HERNIA REPAIR     rt and left  . lexiscan cardiolite    . TILT TABLE STUDY N/A 04/01/2012   Procedure: TILT TABLE STUDY;  Surgeon: [name], MD;  [facility];  Service: Cardiovascular;  Laterality: N/A;    Past Medical History:  Diagnosis Date  . Acid reflux   . Anemia   . Anxiety   . Bipolar disorder (HCC)   . Chronic kidney disease    STAGE 3  . Depression   . Diabetes mellitus    NIDD  . Essential tremor   . Family history of ovarian cancer    Pt doesn't meet Medicare genetic testing guidelines  . Hyperlipidemia   . Hypothyroid   . IBS (irritable bowel syndrome)   . Migraine headache    vestibular migraine  . Obesity   . Osteopenia   . Restless leg syndrome   . Sarcoidosis   . Sleep apnea   . Syncope \   Outpatient Encounter Medications as of 05/26/2019  Medication Sig  . ALPRAZolam (XANAX) 1 MG tablet Take 1 mg by mouth 3 (three) times daily as needed for anxiety or sleep.  . Armodafinil 250 MG tablet Take 250 mg by mouth daily.  Marland Kitchen. atorvastatin (LIPITOR) 40 MG tablet Take 40 mg by mouth at  bedtime.   . benztropine (COGENTIN) 1 MG tablet Take 1 mg by mouth 2 (two) times daily.   Marland Kitchen. buPROPion (WELLBUTRIN XL) 300 MG 24 hr tablet Take 300 mg by mouth daily.  Marland Kitchen. docusate sodium (COLACE) 100 MG capsule Take 100 mg by mouth 2 (two) times daily.   . furosemide (LASIX) 20 MG tablet Take 20 mg by mouth daily.  Marland Kitchen. levothyroxine (SYNTHROID, LEVOTHROID) 100 MCG tablet Take 100 mcg by mouth daily before breakfast. Takes at bedtime  . liothyronine (CYTOMEL) 5 MCG tablet Take 5 mcg by mouth 2 (two) times daily.   . methocarbamol (ROBAXIN) 750 MG tablet Take 750 mg by mouth as needed for muscle spasms.  . Nutritional Supplements (MELATONIN PO) Take 10 mg by mouth at bedtime.   . Ospemifene (OSPHENA) 60 MG TABS Take 1 tablet by mouth daily.  . pantoprazole (PROTONIX) 40 MG tablet Take 40 mg by mouth daily.  . primidone (MYSOLINE) 50 MG tablet Take 50-100 mg by mouth See admin instructions. 50 mg daily(breakfast) , 100 mg at bedtime  . topiramate (TOPAMAX) 200 MG tablet Take 300-400 mg by mouth See admin instructions.  pt take 2 tabs (400 mg) q am, and 1.5 (300 mg) hs  . ziprasidone (GEODON) 60 MG capsule Take 180 mg by mouth every evening. After dinner   No facility-administered encounter medications on file as of 05/26/2019.    Allergies  Allergen Reactions  . Cleocin [Clindamycin Hcl] Other (See Comments)    GI distress    Social History   Tobacco Use  . Smoking status: Former Smoker    Types: Cigarettes    Quit date: 2015    Years since quitting: 5.6  . Smokeless tobacco: Never Used  Substance Use Topics  . Alcohol use: No   Functioning Relationships: good support system and good relationship with spouse or significant other Education: Other  Other Pertinent History: None Family History  Problem Relation Age of Onset  . Colon cancer Father   . Stomach cancer Father   . Hypertension Mother   . Hyperlipidemia Mother   . Alcohol abuse Brother   . Irritable bowel syndrome Sister   .  Breast cancer Paternal Aunt   . Ovarian cancer Paternal Aunt   . Kidney cancer Neg Hx   . Kidney disease Neg Hx   . Prostate cancer Neg Hx      Review of Systems Constitutional: negative  Objective:  There were no vitals filed for this visit.  Physical Exam:   Mental Status Exam: Appearance:  N/A Psychomotor::  Within Normal Limits Attention span and concentration: Normal Behavior: calm and cooperative Speech:  normal volume Mood:  depressed and anxious Affect:  normal Thought Process:  Coherent Thought Content:  Hallucinations: Auditory Orientation:  person and place Cognition:  grossly intact Insight:  Poor Judgment:  Intact Estimate of Intelligence: Average Fund of knowledge: Aware of current events Memory: Recent and remote intact Abnormal movements: Gait and station:   Assessment:  Diagnosis: No primary diagnosis found. No diagnosis found.  Indications for admission: inpatient care required if not in partial hospital program  Plan: patient enrolled in Partial Hospitalization Program, patient's current medications are to be continued, a comprehensive treatment plan will be developed and side effects of medications have been reviewed with patient  Treatment options and alternatives reviewed with patient and patient understands the above plan. Treatment plan was reviewed and agreed upon by NP T.Melvyn NethLewis and patient Jennifer MayersMichele Bright need for group services      Oneta Rackanika N Lataisha Colan, NP

## 2019-05-28 ENCOUNTER — Other Ambulatory Visit: Payer: Self-pay

## 2019-05-28 ENCOUNTER — Encounter (HOSPITAL_COMMUNITY): Payer: Self-pay

## 2019-05-28 ENCOUNTER — Encounter (HOSPITAL_COMMUNITY): Payer: Self-pay | Admitting: Occupational Therapy

## 2019-05-28 NOTE — Therapy (Signed)
[facility] [place], [place], [address] Phone: [phone]   Fax:  [phone]  Occupational Therapy Evaluation  Patient Details  Name: Jennifer Bright MRN: [id number] Date of Birth: 1966/06/25 Referring Provider (OT): [name], NP  Virtual Visit via Video Note  I connected with Jennifer Bright on 05/28/19 at  8:00 AM EDT by a video enabled telemedicine application and verified that I am speaking with the correct person using two identifiers.   I discussed the limitations of evaluation and management by telemedicine and the availability of in person appointments. The patient expressed understanding and agreed to proceed.   I discussed the assessment and treatment plan with the patient. The patient was provided an opportunity to ask questions and all were answered. The patient agreed with the plan and demonstrated an understanding of the instructions.   The patient was advised to call back or seek an in-person evaluation if the symptoms worsen or if the condition fails to improve as anticipated.  I provided 90 minutes of non-face-to-face time during this encounter.  [name], MSOT, OTR/L Behavioral Health OT/ Acute Relief OT PHP Office: [phone]  [name], [place]    Encounter Date: 05/25/2019  OT End of Session - 05/28/19 2024    Visit Number  1    Number of Visits  12    Date for OT Re-Evaluation  06/25/19    Authorization Type  [employer]    OT Start Time  1200    OT Stop Time  1330    OT Time Calculation (min)  90 min    Activity Tolerance  Patient tolerated treatment well    Behavior During Therapy  [facility] for tasks assessed/performed       Past Medical History:  Diagnosis Date  . Acid reflux   . Anemia   . Anxiety   . Bipolar disorder ([place])   . Chronic kidney disease    STAGE 3  . Depression   . Diabetes mellitus    NIDD  . Essential tremor   . Family history of ovarian  cancer    Pt doesn't meet Medicare genetic testing guidelines  . Hyperlipidemia   . Hypothyroid   . IBS (irritable bowel syndrome)   . Migraine headache    vestibular migraine  . Obesity   . Osteopenia   . Restless leg syndrome   . Sarcoidosis   . Sleep apnea   . Syncope \    Past Surgical History:  Procedure Laterality Date  . ABDOMINAL HYSTERECTOMY    . APPENDECTOMY    . BLADDER SURGERY     bladder tact  . CATARACT EXTRACTION W/PHACO Right 09/21/2018   Procedure: CATARACT EXTRACTION PHACO AND INTRAOCULAR LENS PLACEMENT ([place]);  Surgeon: [name], MD;  Location: [facility] ORS;  Service: Ophthalmology;  Laterality: Right;  [place]  00:20 CDE 00.82 Fluid pack lot # [id number]  . CATARACT EXTRACTION W/PHACO Left 10/17/2018   Procedure: CATARACT EXTRACTION PHACO AND INTRAOCULAR LENS PLACEMENT ([place])  LEFT;  Surgeon: [name], MD;  Location: [place];  Service: Ophthalmology;  Laterality: Left;  diabetic - diet controlled  . CESAREAN SECTION    . CHOLECYSTECTOMY    . COLONOSCOPY WITH PROPOFOL N/A 04/05/2017   Procedure: COLONOSCOPY WITH PROPOFOL;  Surgeon: [name], MD;  Location[facility] ENDOSCOPY;  Service: Endoscopy;  Laterality: N/A;  . ESOPHAGOGASTRODUODENOSCOPY (EGD) WITH PROPOFOL N/A 08/28/2016   Procedure: ESOPHAGOGASTRODUODENOSCOPY (EGD) WITH PROPOFOL;  Surgeon: [name], MD;  [facility];  Service: Endoscopy;  Laterality: N/A;  . HERNIA REPAIR    . INGUINAL HERNIA REPAIR     rt and left  . lexiscan cardiolite    . TILT TABLE STUDY N/A 04/01/2012   Procedure: TILT TABLE STUDY;  Surgeon: [name], MD;  [facility];  Service: Cardiovascular;  Laterality: N/A;    There were no vitals filed for this visit.  Subjective Assessment - 05/28/19 2021    Currently in Pain?  No/denies        [facility] OT Assessment - 05/28/19 0001      Assessment   Medical Diagnosis  Bipolar disorder, current episode depressed, with  psychotic features    Referring Provider (OT)  Hillery Jacksanika Lewis, NP    Onset Date/Surgical Date  05/25/19      Precautions   Precautions  None      Restrictions   Weight Bearing Restrictions  No      Balance Screen   Has the patient fallen in the past 6 months  No    Has the patient had a decrease in activity level because of a fear of falling?   No    Is the patient reluctant to leave their home because of a fear of falling?   No         OT assessment: OCAIRS  Diagnosis: Bipolar disorder, current episode depressed with psychotic features  Past medical history/referral information: [facility]. Pt presents labile and tangential during evaluation, having difficulty following questions and entirety of evaluation without redirection. Pt had husband present to aide with technological parts of Webex, also looking to husband to answer most questions. Information provided based on what pt was able to answer at this time, will continue to assess for further information.  Living situation: Lives with husband  ADLs/IADLs: Reports decreased motivation and participation in- showering, cleaning, cooking  Sleep: shares she will go 1-2 nights without sleeping at all and states "I am only doing it to appease my husband"  Social support: very isolated and lonely, only contact is husband who works. Shares familial conflict with children and grandchildren. Is mostly isolated to home  OCAIRS Mental Health Interview Summary of Client Scores:  FACILITATES PARTICIPATION IN OCCUPATION  ALLOWS PARTICIPATION IN OCCUPATION INHIBITS PARTICIPATION IN OCCUPATION RESTRICTS PARTICIPATION IN OCCUPATION COMMENTS  ROLES               X   HABITS               X   PERSONAL CAUSATION               X   VALUES              X    INTERESTS              X    SKILLS               X   SHORT TERM GOALS               X   LONG TERM GOALS               X   INTERPETATION OF PAST EXPERIENCES                X   PHYSICAL ENVIRONMENT              X  SOCIAL ENVIRONMENT               X   READINESS FOR CHANGE              X      Need for Occupational Therapy:  4 Shows positive occupational participation, no need for OT.   3 Need for minimal intervention/consultative participation   2 Need for OT intervention indicated to restore/improve participation    X 1 Need for extensive OT intervention indicated to improve participation.  Referral for follow up services also recommended.    Assessment:  Patient demonstrates behavior that restricts participation in occupation.  Patient will benefit from occupational therapy intervention in order to improve time management, financial management, stress management, job readiness skills, social skills, and health management skills in preparation to return to full time community living and to be a productive community member.    Plan:  Patient will participate in skilled occupational therapy sessions individually or in a group setting to improve coping skills, psychosocial skills, and emotional skills required to return to prior level of function.  Treatment will be 3 times per week for 4 weeks.       S: I only sleep every few nights  O: Pt educated on sleep hygiene as it pertains to daily life/routines this date. Education given on appropriate sleep routines, sleep disorders, detriments of too much/too little sleep with encouraged feedback of personal Experiences. Sleep hygiene handout given for pt to choose new areas for implementation in BADL routine. Further education given on relaxation techniques to implement before bed. Pt asked to identify one STG in relation to sleep hygiene to create better daily sleep habits.   A: Pt presents with blunted affect, engaged and participatory. She shares that she will go without sleeping every 1-2 nights and only lays down at certain times "to appease her husband". Pt did share she uses an eye mask and white noise machine  to aide in sleeping, as well as occasionally listens to relaxation music. She shares that she would like to learn to use an app on her phone to help with routine and defer from taking naps on the days she does not sleep at night to better regiment her sleep schedule.  P: OT group will be x3 per week while pt in PHP             OT Education - 05/28/19 2022    Education Details  education given on sleep hygiene    Person(s) Educated  Patient    Methods  Explanation;Handout    Comprehension  Verbalized understanding       OT Short Term Goals - 05/28/19 2027      OT SHORT TERM GOAL #1   Title  Pt will be education on strategies to improve psychosocial skills needed to participate in all daily, work, and leisure activiteis    Time  4    Period  Weeks    Status  New    Target Date  06/25/19      OT SHORT TERM GOAL #2   Title  Pt will apply psychosocial skills and coping mechanisms to daily activities in order to function independently and reintegrate into community    Time  4    Period  Weeks    Status  New      OT SHORT TERM GOAL #3   Title  Pt will recall and apply 1-3 sleep hygiene strategies to improve BADL function prior to  reintegrating into community    Time  4    Period  Weeks    Status  New      OT SHORT TERM GOAL #4   Title  Pt will recall 1-3 social participation opportunities to decrease isolation prior to reintegrating into community    Time  4    Period  Weeks    Status  New      OT SHORT TERM GOAL #5   Title  Pt will recall and/or apply 1-3 assertiveness skills to increase pro social contact prior to reintegrating into community    Time  4    Period  Weeks               Plan - 05/28/19 2025    OT Occupational Profile and History  Detailed Assessment- Review of Records and additional review of physical, cognitive, psychosocial history related to current functional performance    Occupational performance deficits (Please refer to evaluation  for details):  ADL's;IADL's;Rest and Sleep;Work;Social Participation;Leisure    Body Structure / Function / Physical Skills  ADL;IADL    Cognitive Skills  Attention;Emotional;Temperament/Personality;Thought    Psychosocial Skills  Coping Strategies;Interpersonal Interaction;Routines and Behaviors;Habits    Rehab Potential  Good    Clinical Decision Making  Multiple treatment options, significant modification of task necessary    Comorbidities Affecting Occupational Performance:  Presence of comorbidities impacting occupational performance    Comorbidities impacting occupational performance description:  sarcoidosis complications; visual deficits    Modification or Assistance to Complete Evaluation   Max significant modification of tasks or assist is necessary to complete    OT Frequency  3x / week    OT Duration  4 weeks    OT Treatment/Interventions  Coping strategies training;Psychosocial skills training;Self-care/ADL training;Other (comment)   community reintegration   Consulted and Agree with Plan of Care  Patient;Family member/caregiver    Family Member Consulted  husbad present for some of evaluation       Patient will benefit from skilled therapeutic intervention in order to improve the following deficits and impairments:   Body Structure / Function / Physical Skills: ADL, IADL Cognitive Skills: Attention, Emotional, Temperament/Personality, Thought Psychosocial Skills: Coping Strategies, Interpersonal Interaction, Routines and Behaviors, Habits   Visit Diagnosis: 1. Bipolar disorder, current episode depressed, severe, with psychotic features (HCC)   2. Difficulty coping       Problem List Patient Active Problem List   Diagnosis Date Noted  . Vaginal atrophy 07/12/2017  . Polypharmacy 08/14/2016  . 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 Hypercalcemia 07/24/2016  . Sarcoidosis 07/24/2016  . Hyperlipidemia, unspecified 11/15/2015  . Type 2 diabetes mellitus without  complication, without long-term current use of insulin (HCC) 11/15/2015  . Anxiety disorder 07/29/2015  . Anxiety 05/12/2015  . Bipolar depression (HCC) 05/12/2015  . Essential (primary) hypertension 05/12/2015  . Benign essential tremor 05/12/2015  . Gastro-esophageal reflux disease without esophagitis 05/12/2015  . Cephalalgia 05/12/2015  . Hypersomnia with sleep apnea 05/12/2015  . Restless leg 05/12/2015  . Diabetes (HCC) 05/12/2015  . Hypothyroidism 05/12/2015  . Migraine variant 05/12/2015  . Bipolar disorder (HCC) 05/12/2015  . Type 2 diabetes mellitus without complications (HCC) 05/12/2015  . Chronic migraine without aura 09/03/2012  . 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 Syncope and collapse 03/17/2012  . Depression 03/17/2012  . Migraine 03/17/2012   Dalphine Handing, MSOT, OTR/L Behavioral Health OT/ Acute Relief OT PHP Office: [phone]  Dalphine Handing 05/28/2019, 8:31 PM  [facility] [address] [place], [place], [address] Phone: [phone]   Fax:  [phone]  Name: Jennifer Bright MRN: [id number] Date of Birth: 08/18/1966

## 2019-05-28 NOTE — Therapy (Signed)
[facility] [address] [place], [place], [address] Phone: [phone]   Fax:  [phone]  Occupational Therapy Treatment  Patient Details  Name: Jennifer QuamMichele F Bright MRN: [id number] Date of Birth: 10/18/1966 Referring Provider (OT): Hillery Jacksanika Lewis, NP   Virtual Visit via Video Note  I connected with Jennifer Bright on 05/28/19 at  8:00 AM EDT by a video enabled telemedicine application and verified that I am speaking with the correct person using two identifiers.   I discussed the limitations of evaluation and management by telemedicine and the availability of in person appointments. The patient expressed understanding and agreed to proceed.    I discussed the assessment and treatment plan with the patient. The patient was provided an opportunity to ask questions and all were answered. The patient agreed with the plan and demonstrated an understanding of the instructions.   The patient was advised to call back or seek an in-person evaluation if the symptoms worsen or if the condition fails to improve as anticipated.  I provided 60 minutes of non-face-to-face time during this encounter.  Dalphine HandingKaylee Kabe Mckoy, Jennifer Bright, [facility] OT/ Acute Relief OT PHP Office: [phone]  Dalphine HandingKaylee Steele [place], [place]    Encounter Date: 05/26/2019  OT End of Session - 05/28/19 2100    Visit Number  2    Number of Visits  12    Date for OT Re-Evaluation  06/25/19    Authorization Type  [employer]    OT Start Time  1100    OT Stop Time  1200    OT Time Calculation (min)  60 min    Activity Tolerance  Patient tolerated treatment well    Behavior During Therapy  [facility] for tasks assessed/performed       Past Medical History:  Diagnosis Date  . Acid reflux   . Anemia   . Anxiety   . Bipolar disorder (HCC)   . Chronic kidney disease    STAGE 3  . Depression   . Diabetes mellitus    NIDD  . Essential tremor   . Family history of ovarian  cancer    Pt doesn't meet Medicare genetic testing guidelines  . Hyperlipidemia   . Hypothyroid   . IBS (irritable bowel syndrome)   . Migraine headache    vestibular migraine  . Obesity   . Osteopenia   . Restless leg syndrome   . Sarcoidosis   . Sleep apnea   . Syncope \    Past Surgical History:  Procedure Laterality Date  . ABDOMINAL HYSTERECTOMY    . APPENDECTOMY    . BLADDER SURGERY     bladder tact  . CATARACT EXTRACTION W/PHACO Right 09/21/2018   Procedure: CATARACT EXTRACTION PHACO AND INTRAOCULAR LENS PLACEMENT (IOC);  Surgeon: Nevada CraneKing, Bradley Mark, MD;  Location: [facility] ORS;  Service: Ophthalmology;  Laterality: Right;  US  00:20 CDE 00.82 Fluid pack lot # [id number]  . CATARACT EXTRACTION W/PHACO Left 10/17/2018   Procedure: CATARACT EXTRACTION PHACO AND INTRAOCULAR LENS PLACEMENT (IOC)  LEFT;  Surgeon: Nevada CraneKing, Bradley Mark, MD;  [facility];  Service: Ophthalmology;  Laterality: Left;  diabetic - diet controlled  . CESAREAN SECTION    . CHOLECYSTECTOMY    . COLONOSCOPY WITH PROPOFOL N/A 04/05/2017   Procedure: COLONOSCOPY WITH PROPOFOL;  Surgeon: Christena DeemSkulskie, Martin U, MD;  [facility];  Service: Endoscopy;  Laterality: N/A;  . ESOPHAGOGASTRODUODENOSCOPY (EGD) WITH PROPOFOL N/A 08/28/2016   Procedure: ESOPHAGOGASTRODUODENOSCOPY (EGD) WITH  PROPOFOL;  Surgeon: [name], MD;  [facility];  Service: Endoscopy;  Laterality: N/A;  . HERNIA REPAIR    . INGUINAL HERNIA REPAIR     rt and left  . lexiscan cardiolite    . TILT TABLE STUDY N/A 04/01/2012   Procedure: TILT TABLE STUDY;  Surgeon: [name], MD;  [facility];  Service: Cardiovascular;  Laterality: N/A;    There were no vitals filed for this visit.  Subjective Assessment - 05/28/19 2100    Currently in Pain?  No/denies        S: I have never done goal setting  O: Education given on self-accountability being in line with personal values and goals to  maintain occupational balance in various community settings. Pt given goal identifying worksheet to list immediate, short term, medium term, and long-term goals using a SMART goal framework (specificity, meaningful, adaptive, realistic, and time bound). Goals created as guideline for pt to practice being accountable in various situations. Pt completed work sheet of goals and encouraged to share goals with the group, with emphasis on immediate goal for check in with pt for next session to maintain accountability   A: Pt presents with bunted affect, engaged and participatory. She shares how she is going to center her goals around social participation. This specifies to family and making new friends to become less lonely.  P: OT Group will be x3 per week while pt in PHP                OT Education - 05/28/19 2100    Education Details  education given on goal setting    Person(s) Educated  Patient    Methods  Explanation;Handout    Comprehension  Verbalized understanding       OT Short Term Goals - 05/28/19 2027      OT SHORT TERM GOAL #1   Title  Pt will be education on strategies to improve psychosocial skills needed to participate in all daily, work, and leisure activiteis    Time  4    Period  Weeks    Status  New    Target Date  06/25/19      OT SHORT TERM GOAL #2   Title  Pt will apply psychosocial skills and coping mechanisms to daily activities in order to function independently and reintegrate into community    Time  4    Period  Weeks    Status  New      OT SHORT TERM GOAL #3   Title  Pt will recall and apply 1-3 sleep hygiene strategies to improve BADL function prior to reintegrating into community    Time  4    Period  Weeks    Status  New      OT SHORT TERM GOAL #4   Title  Pt will recall 1-3 social participation opportunities to decrease isolation prior to reintegrating into community    Time  4    Period  Weeks    Status  New      OT SHORT TERM GOAL  #5   Title  Pt will recall and/or apply 1-3 assertiveness skills to increase pro social contact prior to reintegrating into community    Time  4    Period  Weeks               Plan - 05/28/19 2101    Occupational performance deficits (Please refer to evaluation for details):  ADL's;IADL's;Rest and Sleep;Work;Social Participation;Leisure    Body Structure / Function / Physical Skills  ADL;IADL    Cognitive Skills  Attention;Emotional;Temperament/Personality;Thought    Psychosocial Skills  Coping Strategies;Interpersonal Interaction;Routines and Behaviors;Habits       Patient will benefit from skilled therapeutic intervention in order to improve the following deficits and impairments:   Body Structure / Function / Physical Skills: ADL, IADL Cognitive Skills: Attention, Emotional, Temperament/Personality, Thought Psychosocial Skills: Coping Strategies, Interpersonal Interaction, Routines and Behaviors, Habits   Visit Diagnosis: 1. Bipolar disorder, current episode depressed, severe, with psychotic features ([place])   2. Difficulty coping       Problem List Patient Active Problem List   Diagnosis Date Noted  . Vaginal atrophy 07/12/2017  . Polypharmacy 08/14/2016  . Sarcoidosis of lung ([place]) 08/14/2016  . Hypercalcemia 07/24/2016  . Sarcoidosis 07/24/2016  . Hyperlipidemia, unspecified 11/15/2015  . Type 2 diabetes mellitus without complication, without long-term current use of insulin ([place]) 11/15/2015  . Anxiety disorder 07/29/2015  . Anxiety 05/12/2015  . Bipolar depression ([place]) 05/12/2015  . Essential (primary) hypertension 05/12/2015  . Benign essential tremor 05/12/2015  . Gastro-esophageal reflux disease without esophagitis 05/12/2015  . Cephalalgia 05/12/2015  . Hypersomnia with sleep apnea 05/12/2015  . Restless leg 05/12/2015  . Diabetes ([place]) 05/12/2015  . Hypothyroidism 05/12/2015  . Migraine variant 05/12/2015  . Bipolar disorder ([place]) 05/12/2015  .  Type 2 diabetes mellitus without complications ([place]) 78/46/9629  . Chronic migraine without aura 09/03/2012  . 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 Syncope and collapse 03/17/2012  . Depression 03/17/2012  . Migraine 03/17/2012   Jennifer Bright, Jennifer Bright, [facility] OT/ Acute Relief OT PHP Office: [phone]  Jennifer Bright 05/28/2019, [employer] PARTIAL HOSPITALIZATION PROGRAM [place], [place], [address] Phone: [phone]   Fax:  [phone]  Name: HILMA STEINHILBER MRN: [id number] Date of Birth: 01-25-1966

## 2019-05-29 ENCOUNTER — Other Ambulatory Visit: Payer: Self-pay

## 2019-05-29 ENCOUNTER — Other Ambulatory Visit (HOSPITAL_COMMUNITY): Payer: Medicare Other | Admitting: Licensed Clinical Social Worker

## 2019-05-29 DIAGNOSIS — F315 Bipolar disorder, current episode depressed, severe, with psychotic features: Secondary | ICD-10-CM | POA: Diagnosis not present

## 2019-05-30 ENCOUNTER — Other Ambulatory Visit (HOSPITAL_COMMUNITY): Payer: Medicare Other | Admitting: Licensed Clinical Social Worker

## 2019-05-30 ENCOUNTER — Encounter (HOSPITAL_COMMUNITY): Payer: Self-pay | Admitting: Family

## 2019-05-30 ENCOUNTER — Other Ambulatory Visit: Payer: Self-pay

## 2019-05-30 ENCOUNTER — Other Ambulatory Visit (HOSPITAL_COMMUNITY): Payer: Medicare Other | Admitting: Occupational Therapy

## 2019-05-30 DIAGNOSIS — F315 Bipolar disorder, current episode depressed, severe, with psychotic features: Secondary | ICD-10-CM

## 2019-05-30 DIAGNOSIS — R4589 Other symptoms and signs involving emotional state: Secondary | ICD-10-CM

## 2019-05-30 NOTE — Progress Notes (Signed)
Virtual Visit via Telephone Note  I connected with Jennifer Bright on 05/30/19 at  9:00 AM EDT by telephone and verified that I am speaking with the correct person using two identifiers.   I discussed the limitations, risks, security and privacy concerns of performing an evaluation and management service by telephone and the availability of in person appointments. I also discussed with the patient that there may be a patient responsible charge related to this service. The patient expressed understanding and agreed to proceed.    I discussed the assessment and treatment plan with the patient. The patient was provided an opportunity to ask questions and all were answered. The patient agreed with the plan and demonstrated an understanding of the instructions.   The patient was advised to call back or seek an in-person evaluation if the symptoms worsen or if the condition fails to improve as anticipated.  I provided 30 minutes of non-face-to-face time during this encounter.   Oneta Rackanika N Lewis, NP  BH MD/PA/NP OP Progress Note  05/30/2019 11:05 AM Jennifer Bright  MRN:  [id number]   Evaluation:  Jennifer Bright reported ongoing auditory hallucinations that are chronic in nature.  She reports mild improvement with voices since starting Geodon a few years ago.  She reports taking and tolerating medications well.  Rates her depression 3 out of 10 with 10 being the worst during this assessment.  Denying suicidal or homicidal ideations.  Reports voices is causing her to stay awake for most of the night.  Reported sleeping from 10pm-1am and is experiencing mild paranoia.  Reported symptoms are chronic in nature.  Denies that the voices are command in nature.  Reported learning new coping skills however is unable to articulate or express what she is learned today.  States she has not been able to utilize them as of yet,as she is just now learning them.  Reports a good appetite.  Continues to express  her husband is supportive during her admission.  Support, encouragement and reassurance was provided.   Visit Diagnosis:    ICD-10-CM   1. Bipolar disorder, current episode depressed, severe, with psychotic features (HCC)  F31.5     Past Psychiatric History:   Past Medical History:  Past Medical History:  Diagnosis Date  . Acid reflux   . Anemia   . Anxiety   . Bipolar disorder (HCC)   . Chronic kidney disease    STAGE 3  . Depression   . Diabetes mellitus    NIDD  . Essential tremor   . Family history of ovarian cancer    Pt doesn't meet Medicare genetic testing guidelines  . Hyperlipidemia   . Hypothyroid   . IBS (irritable bowel syndrome)   . Migraine headache    vestibular migraine  . Obesity   . Osteopenia   . Restless leg syndrome   . Sarcoidosis   . Sleep apnea   . Syncope \    Past Surgical History:  Procedure Laterality Date  . ABDOMINAL HYSTERECTOMY    . APPENDECTOMY    . BLADDER SURGERY     bladder tact  . CATARACT EXTRACTION W/PHACO Right 09/21/2018   Procedure: CATARACT EXTRACTION PHACO AND INTRAOCULAR LENS PLACEMENT (IOC);  Surgeon: Nevada CraneKing, Bradley Mark, MD;  Location: [facility] ORS;  Service: Ophthalmology;  Laterality: Right;  US  00:20 CDE 00.82 Fluid pack lot # [id number]  . CATARACT EXTRACTION W/PHACO Left 10/17/2018   Procedure: CATARACT EXTRACTION PHACO AND INTRAOCULAR LENS PLACEMENT (IOC)  LEFT;  Surgeon: [name], MD;  Location: [place];  Service: Ophthalmology;  Laterality: Left;  diabetic - diet controlled  . CESAREAN SECTION    . CHOLECYSTECTOMY    . COLONOSCOPY WITH PROPOFOL N/A 04/05/2017   Procedure: COLONOSCOPY WITH PROPOFOL;  Surgeon: [name], MD;  [facility];  Service: Endoscopy;  Laterality: N/A;  . ESOPHAGOGASTRODUODENOSCOPY (EGD) WITH PROPOFOL N/A 08/28/2016   Procedure: ESOPHAGOGASTRODUODENOSCOPY (EGD) WITH PROPOFOL;  Surgeon: [name], MD;  [facility]OSCOPY;  Service:  Endoscopy;  Laterality: N/A;  . HERNIA REPAIR    . INGUINAL HERNIA REPAIR     rt and left  . lexiscan cardiolite    . TILT TABLE STUDY N/A 04/01/2012   Procedure: TILT TABLE STUDY;  Surgeon: Deboraha Sprang, MD;  Location: [facility] CATH LAB;  Service: Cardiovascular;  Laterality: N/A;    Family Psychiatric History:   Family History:  Family History  Problem Relation Age of Onset  . Colon cancer Father   . Stomach cancer Father   . Hypertension Mother   . Hyperlipidemia Mother   . Alcohol abuse Brother   . Irritable bowel syndrome Sister   . Breast cancer Paternal Aunt   . Ovarian cancer Paternal Aunt   . Kidney cancer Neg Hx   . Kidney disease Neg Hx   . Prostate cancer Neg Hx     Social History:  Social History   Socioeconomic History  . Marital status: Married    Spouse name: Not on file  . Number of children: Not on file  . Years of education: Not on file  . Highest education level: Not on file  Occupational History  . Occupation: disabled  Social Needs  . Financial resource strain: Not hard at all  . Food insecurity    Worry: Never true    Inability: Never true  . Transportation needs    Medical: No    Non-medical: No  Tobacco Use  . Smoking status: Former Smoker    Types: Cigarettes    Quit date: 2015    Years since quitting: 5.6  . Smokeless tobacco: Never Used  Substance and Sexual Activity  . Alcohol use: No  . Drug use: No  . Sexual activity: Yes  Lifestyle  . Physical activity    Days per week: 0 days    Minutes per session: 0 min  . Stress: Very much  Relationships  . Social Herbalist on phone: Never    Gets together: Never    Attends religious service: More than 4 times per year    Active member of club or organization: No    Attends meetings of clubs or organizations: Never    Relationship status: Married  Other Topics Concern  . Not on file  Social History Narrative  . Not on file    Allergies:  Allergies  Allergen  Reactions  . Cleocin [Clindamycin Hcl] Other (See Comments)    GI distress    Metabolic Disorder Labs: Lab Results  Component Value Date   HGBA1C 5.2 07/24/2016   MPG 103 07/24/2016   No results found for: PROLACTIN Lab Results  Component Value Date   CHOL 157 05/17/2014   TRIG 271 (H) 05/17/2014   HDL 29 (L) 05/17/2014   VLDL 54 (H) 05/17/2014   LDLCALC 74 05/17/2014   Lab Results  Component Value Date   TSH 0.05 (L) 05/17/2014    Therapeutic Level Labs: No results found for: LITHIUM  No results found for: VALPROATE No components found for:  CBMZ  Current Medications: Current Outpatient Medications  Medication Sig Dispense Refill  . ALPRAZolam (XANAX) 1 MG tablet Take 1 mg by mouth 3 (three) times daily as needed for anxiety or sleep.    . Armodafinil 250 MG tablet Take 250 mg by mouth daily.    Marland Kitchen. atorvastatin (LIPITOR) 40 MG tablet Take 40 mg by mouth at bedtime.     . benztropine (COGENTIN) 1 MG tablet Take 1 mg by mouth 2 (two) times daily.     Marland Kitchen. buPROPion (WELLBUTRIN XL) 300 MG 24 hr tablet Take 300 mg by mouth daily.    Marland Kitchen. docusate sodium (COLACE) 100 MG capsule Take 100 mg by mouth 2 (two) times daily.     . furosemide (LASIX) 20 MG tablet Take 20 mg by mouth daily.    Marland Kitchen. levothyroxine (SYNTHROID, LEVOTHROID) 100 MCG tablet Take 100 mcg by mouth daily before breakfast. Takes at bedtime    . liothyronine (CYTOMEL) 5 MCG tablet Take 5 mcg by mouth 2 (two) times daily.     . methocarbamol (ROBAXIN) 750 MG tablet Take 750 mg by mouth as needed for muscle spasms.    . Nutritional Supplements (MELATONIN PO) Take 10 mg by mouth at bedtime.     . Ospemifene (OSPHENA) 60 MG TABS Take 1 tablet by mouth daily. 30 tablet 11  . pantoprazole (PROTONIX) 40 MG tablet Take 40 mg by mouth daily.    . primidone (MYSOLINE) 50 MG tablet Take 50-100 mg by mouth See admin instructions. 50 mg daily(breakfast) , 100 mg at bedtime    . topiramate (TOPAMAX) 200 MG tablet Take 300-400 mg by  mouth See admin instructions. pt take 2 tabs (400 mg) q am, and 1.5 (300 mg) hs    . ziprasidone (GEODON) 60 MG capsule Take 180 mg by mouth every evening. After dinner     No current facility-administered medications for this visit.      Musculoskeletal:   Psychiatric Specialty Exam: ROS  There were no vitals taken for this visit.There is no height or weight on file to calculate BMI.  General Appearance: NA  Eye Contact:  NA  Speech:  Clear and Coherent and Slow  Volume:  Normal  Mood:  Anxious and Depressed  Affect:  Depressed  Thought Process:  Coherent  Orientation:  Full (Time, Place, and Person)  Thought Content: Hallucinations: Auditory   Suicidal Thoughts:  No  Homicidal Thoughts:  No  Memory:  Immediate;   Fair Recent;   Fair  Judgement:  Fair  Insight:  Fair  Psychomotor Activity:  Normal  Concentration:  Concentration: Fair  Recall:  FiservFair  Fund of Knowledge: Fair  Language: Fair  Akathisia:  No  Handed:  Right  AIMS (if indicated):   Assets:  Communication Skills Desire for Improvement Resilience Social Support  ADL's:  Intact  Cognition: WNL  Sleep:  Fair   Screenings: AIMS     [facility]  AIMS Total Score  0    AUDIT     Ad[facility] ([facility]  Alcohol Use Disorder Identification Test Final Score (AUDIT)  0    PHQ2-9     Counselor from 05/26/2019 in [facility] HOSPITAL[facility] PROGRAM  PHQ-2 Total Score  6  PHQ-9 Total Score  17       Assessment and Plan:  Continue  partial hospitalization programming Continue medications as directed   Treatment plan was reviewed and agreed upon by NP T. Melvyn NethLewis and patient Jennifer Bright's need for continued group services   Oneta Rackanika N Lewis, NP 05/30/2019, 11:05 AM

## 2019-05-31 ENCOUNTER — Other Ambulatory Visit: Payer: Self-pay

## 2019-05-31 ENCOUNTER — Encounter (HOSPITAL_COMMUNITY): Payer: Self-pay | Admitting: Occupational Therapy

## 2019-05-31 ENCOUNTER — Other Ambulatory Visit (HOSPITAL_COMMUNITY): Payer: Medicare Other | Admitting: Licensed Clinical Social Worker

## 2019-05-31 DIAGNOSIS — F315 Bipolar disorder, current episode depressed, severe, with psychotic features: Secondary | ICD-10-CM | POA: Diagnosis not present

## 2019-05-31 NOTE — Therapy (Signed)
[facility] [address] [place], [place], [address] Phone: [phone]   Fax:  [phone]  Occupational Therapy Treatment  Patient Details  Name: Jennifer Bright MRN: [id number] Date of Birth: 09/26/1966 Referring Provider (OT): Hillery Jacksanika Lewis, NP  Virtual Visit via Video Note  I connected with Jennifer Bright on 05/31/19 at  8:00 AM EDT by a video enabled telemedicine application and verified that I am speaking with the correct person using two identifiers.   I discussed the limitations of evaluation and management by telemedicine and the availability of in person appointments. The patient expressed understanding and agreed to proceed.   I discussed the assessment and treatment plan with the patient. The patient was provided an opportunity to ask questions and all were answered. The patient agreed with the plan and demonstrated an understanding of the instructions.   The patient was advised to call back or seek an in-person evaluation if the symptoms worsen or if the condition fails to improve as anticipated.  I provided 60 minutes of non-face-to-face time during this encounter.  Dalphine HandingKaylee Aira Sallade, MSOT, OTR/L Behavioral Health OT/ Acute Relief OT PHP Office: [phone]  Dalphine HandingKaylee Cuca Benassi, [place]    Encounter Date: 05/30/2019  OT End of Session - 05/31/19 1046    Visit Number  3    Number of Visits  12    Date for OT Re-Evaluation  06/25/19    Authorization Type  [employer]    OT Start Time  1100    OT Stop Time  1200    OT Time Calculation (min)  60 min    Activity Tolerance  Patient tolerated treatment well    Behavior During Therapy  WFL for tasks assessed/performed       Past Medical History:  Diagnosis Date  . Acid reflux   . Anemia   . Anxiety   . Bipolar disorder (HCC)   . Chronic kidney disease    STAGE 3  . Depression   . Diabetes mellitus    NIDD  . Essential tremor   . Family history of ovarian  cancer    Pt doesn't meet Medicare genetic testing guidelines  . Hyperlipidemia   . Hypothyroid   . IBS (irritable bowel syndrome)   . Migraine headache    vestibular migraine  . Obesity   . Osteopenia   . Restless leg syndrome   . Sarcoidosis   . Sleep apnea   . Syncope \    Past Surgical History:  Procedure Laterality Date  . ABDOMINAL HYSTERECTOMY    . APPENDECTOMY    . BLADDER SURGERY     bladder tact  . CATARACT EXTRACTION W/PHACO Right 09/21/2018   Procedure: CATARACT EXTRACTION PHACO AND INTRAOCULAR LENS PLACEMENT (IOC);  Surgeon: Nevada CraneKing, Bradley Mark, MD;  Location: [facility] ORS;  Service: Ophthalmology;  Laterality: Right;  US  00:20 CDE 00.82 Fluid pack lot # [id number]  . CATARACT EXTRACTION W/PHACO Left 10/17/2018   Procedure: CATARACT EXTRACTION PHACO AND INTRAOCULAR LENS PLACEMENT (IOC)  LEFT;  Surgeon: Nevada CraneKing, Bradley Mark, MD;  [facility];  Service: Ophthalmology;  Laterality: Left;  diabetic - diet controlled  . CESAREAN SECTION    . CHOLECYSTECTOMY    . COLONOSCOPY WITH PROPOFOL N/A 04/05/2017   Procedure: COLONOSCOPY WITH PROPOFOL;  Surgeon: Christena DeemSkulskie, Martin U, MD;  [facility];  Service: Endoscopy;  Laterality: N/A;  . ESOPHAGOGASTRODUODENOSCOPY (EGD) WITH PROPOFOL N/A 08/28/2016   Procedure: ESOPHAGOGASTRODUODENOSCOPY (EGD) WITH PROPOFOL;  Surgeon: [name], MD;  Location: [facility] ENDOSCOPY;  Service: Endoscopy;  Laterality: N/A;  . HERNIA REPAIR    . INGUINAL HERNIA REPAIR     rt and left  . lexiscan cardiolite    . TILT TABLE STUDY N/A 04/01/2012   Procedure: TILT TABLE STUDY;  Surgeon: Deboraha Sprang, MD;  Location: [facility] CATH LAB;  Service: Cardiovascular;  Laterality: N/A;    There were no vitals filed for this visit.  Subjective Assessment - 05/31/19 1046    Currently in Pain?  No/denies       S: "My self esteem is very low"   O:Education given on self esteem and how it relates to daily experiences. Pt asked to give  definition of self esteem, and current rating of self esteem. Positive and negative contributors of self esteem to be brainstormed within group in relation to personal experiences. Positive thinking activity then completed for pt to identify several positive traits about themselves. Pt asked to share at end of session. Not all education completed and to be finished next treatment session.  A: Pt presents to group, tangential without redirection this date. She shares how her self esteem is very low. She shares how her family member makes fun of the way that she eats, which has affected her in day to day life.   P: OT group will be x3 per week while pt in [place]                  OT Education - 05/31/19 1046    Education Details  education given on self esteem    Person(s) Educated  Patient    Methods  Explanation;Handout    Comprehension  Verbalized understanding       OT Short Term Goals - 05/31/19 1047      OT SHORT TERM GOAL #1   Title  Pt will be education on strategies to improve psychosocial skills needed to participate in all daily, work, and leisure activiteis    Time  4    Period  Weeks    Status  On-going    Target Date  06/25/19      OT SHORT TERM GOAL #2   Title  Pt will apply psychosocial skills and coping mechanisms to daily activities in order to function independently and reintegrate into community    Time  4    Period  Weeks    Status  On-going      OT SHORT TERM GOAL #3   Title  Pt will recall and apply 1-3 sleep hygiene strategies to improve BADL function prior to reintegrating into community    Time  4    Period  Weeks    Status  On-going      OT SHORT TERM GOAL #4   Title  Pt will recall 1-3 social participation opportunities to decrease isolation prior to reintegrating into community    Time  4    Period  Weeks    Status  On-going      OT SHORT TERM GOAL #5   Title  Pt will recall and/or apply 1-3 assertiveness skills to increase pro social  contact prior to reintegrating into community    Time  4    Period  Weeks    Status  On-going               Plan - 05/31/19 1047    Occupational performance deficits (Please refer to evaluation for details):  ADL's;IADL's;Rest  and Sleep;Work;Social Participation;Leisure    Body Structure / Function / Physical Skills  ADL;IADL    Cognitive Skills  Attention;Emotional;Temperament/Personality;Thought    Psychosocial Skills  Coping Strategies;Interpersonal Interaction;Routines and Behaviors;Habits       Patient will benefit from skilled therapeutic intervention in order to improve the following deficits and impairments:   Body Structure / Function / Physical Skills: ADL, IADL Cognitive Skills: Attention, Emotional, Temperament/Personality, Thought Psychosocial Skills: Coping Strategies, Interpersonal Interaction, Routines and Behaviors, Habits   Visit Diagnosis: 1. Bipolar disorder, current episode depressed, severe, with psychotic features (HCC)   2. Difficulty coping       Problem List Patient Active Problem List   Diagnosis Date Noted  . Vaginal atrophy 07/12/2017  . Polypharmacy 08/14/2016  . 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 Hypercalcemia 07/24/2016  . Sarcoidosis 07/24/2016  . Hyperlipidemia, unspecified 11/15/2015  . Type 2 diabetes mellitus without complication, without long-term current use of insulin (HCC) 11/15/2015  . Anxiety disorder 07/29/2015  . Anxiety 05/12/2015  . Bipolar depression (HCC) 05/12/2015  . Essential (primary) hypertension 05/12/2015  . Benign essential tremor 05/12/2015  . Gastro-esophageal reflux disease without esophagitis 05/12/2015  . Cephalalgia 05/12/2015  . Hypersomnia with sleep apnea 05/12/2015  . Restless leg 05/12/2015  . Diabetes (HCC) 05/12/2015  . Hypothyroidism 05/12/2015  . Migraine variant 05/12/2015  . Bipolar disorder (HCC) 05/12/2015  . Type 2 diabetes mellitus without complications (HCC) 05/12/2015  .  Chronic migraine without aura 09/03/2012  . 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 Syncope and collapse 03/17/2012  . Depression 03/17/2012  . Migraine 03/17/2012   Dalphine HandingKaylee Chirstina Haan, MSOT, OTR/L Behavioral Health OT/ Acute Relief OT PHP Office: [phone]  Dalphine HandingKaylee Benzion Mesta 05/31/2019, 10:48 AM  [facility] [address] [place], [place], [address] Phone: [phone]   Fax:  [phone]  Name: Jennifer Bright MRN: [id number] Date of Birth: 08/30/1966

## 2019-05-31 NOTE — Progress Notes (Signed)
Spoke with patient via telephone call due to limited knowledge of computer technology, DOB used to correctly identify patient. Patient was preoccupied, could hear group therapy via her computer going on very loudly in the background. She gave short, quick answers stating that group was going well. She was speaking very low (possibly due to group going on at the same time?). Denied SI/HI but does still hear voices that say they are coming to get her. States the [place] is working well with no side effects. Unable to verify other medication besides Geodon and Xanax because she says her husband keeps them locked up. On scale 1-10 as 10 being worse she rates depression at 3 and anxiety at 3. However, it sounded as if she was crying. She denied being upset and stated "I'm fine, thanks for calling." Sent a message to therapist Sonia Baller and Ludger Nutting NP notifying them of patients current mental health condition and concerns.

## 2019-06-01 ENCOUNTER — Other Ambulatory Visit (HOSPITAL_COMMUNITY): Payer: Medicare Other | Admitting: Occupational Therapy

## 2019-06-01 ENCOUNTER — Other Ambulatory Visit (HOSPITAL_COMMUNITY): Payer: Medicare Other | Admitting: Licensed Clinical Social Worker

## 2019-06-01 ENCOUNTER — Other Ambulatory Visit: Payer: Self-pay

## 2019-06-01 ENCOUNTER — Encounter (HOSPITAL_COMMUNITY): Payer: Self-pay

## 2019-06-01 DIAGNOSIS — F315 Bipolar disorder, current episode depressed, severe, with psychotic features: Secondary | ICD-10-CM | POA: Diagnosis not present

## 2019-06-01 DIAGNOSIS — R4589 Other symptoms and signs involving emotional state: Secondary | ICD-10-CM

## 2019-06-01 NOTE — Progress Notes (Signed)
Virtual Visit via Telephone Note  I connected with Jennifer QuamMichele F Bewick on 06/01/19 at  9:00 AM EDT by telephone and verified that I am speaking with the correct person using two identifiers.   I discussed the limitations, risks, security and privacy concerns of performing an evaluation and management service by telephone and the availability of in person appointments. I also discussed with the patient that there may be a patient responsible charge related to this service. The patient expressed understanding and agreed to proceed.    I discussed the assessment and treatment plan with the patient. The patient was provided an opportunity to ask questions and all were answered. The patient agreed with the plan and demonstrated an understanding of the instructions.   The patient was advised to call back or seek an in-person evaluation if the symptoms worsen or if the condition fails to improve as anticipated.  I provided 00 minutes of non-face-to-face time during this encounter.   Oneta Rackanika N , NP   BH MD/PA/NP OP Progress Note  06/01/2019 9:08 AM Jennifer Bright  MRN:  [id number]   Evaluation: Reassessment due to concerns by treatment team. Reported flat and guarded affect during daily group session. It was reported patient was provided with crisis line information.  Patient is currently denying suicidal or homicidal ideations.  Denies visual hallucinations.  During this assessment patient sounded tearful, flat and depressed.  Patient did not elaborate on any new or reoccurring stressors.  Patient only reported " I am fine, I am fine"  Patient reported chronic auditory hallucination. Denies worsening depression or depressive symptoms. Discussed group participation and daily progression we will follow-up with patient on 06/02/2019 on progress.  Will consider additional outpatient resources for further assistance.   Visit Diagnosis:    ICD-10-CM   1. Bipolar disorder, current episode  depressed, severe, with psychotic features (HCC)  F31.5     Past Psychiatric History:   Past Medical History:  Past Medical History:  Diagnosis Date  . Acid reflux   . Anemia   . Anxiety   . Bipolar disorder (HCC)   . Chronic kidney disease    STAGE 3  . Depression   . Diabetes mellitus    NIDD  . Essential tremor   . Family history of ovarian cancer    Pt doesn't meet Medicare genetic testing guidelines  . Hyperlipidemia   . Hypothyroid   . IBS (irritable bowel syndrome)   . Migraine headache    vestibular migraine  . Obesity   . Osteopenia   . Restless leg syndrome   . Sarcoidosis   . Sleep apnea   . Syncope \    Past Surgical History:  Procedure Laterality Date  . ABDOMINAL HYSTERECTOMY    . APPENDECTOMY    . BLADDER SURGERY     bladder tact  . CATARACT EXTRACTION W/PHACO Right 09/21/2018   Procedure: CATARACT EXTRACTION PHACO AND INTRAOCULAR LENS PLACEMENT (IOC);  Surgeon: Nevada CraneKing, Bradley Mark, MD;  Location: [facility] ORS;  Service: Ophthalmology;  Laterality: Right;  US  00:20 CDE 00.82 Fluid pack lot # [id number]  . CATARACT EXTRACTION W/PHACO Left 10/17/2018   Procedure: CATARACT EXTRACTION PHACO AND INTRAOCULAR LENS PLACEMENT (IOC)  LEFT;  Surgeon: Nevada CraneKing, Bradley Mark, MD;  [facility];  Service: Ophthalmology;  Laterality: Left;  diabetic - diet controlled  . CESAREAN SECTION    . CHOLECYSTECTOMY    . COLONOSCOPY WITH PROPOFOL N/A 04/05/2017   Procedure: COLONOSCOPY WITH PROPOFOL;  Surgeon: Marva PandaSkulskie,  Cindra EvesMartin U, MD;  Location: [facility] ENDOSCOPY;  Service: Endoscopy;  Laterality: N/A;  . ESOPHAGOGASTRODUODENOSCOPY (EGD) WITH PROPOFOL N/A 08/28/2016   Procedure: ESOPHAGOGASTRODUODENOSCOPY (EGD) WITH PROPOFOL;  Surgeon: [name], MD;  [facility];  Service: Endoscopy;  Laterality: N/A;  . HERNIA REPAIR    . INGUINAL HERNIA REPAIR     rt and left  . lexiscan cardiolite    . TILT TABLE STUDY N/A 04/01/2012   Procedure: TILT TABLE  STUDY;  Surgeon: [name], MD;  [facility];  Service: Cardiovascular;  Laterality: N/A;    Family Psychiatric History:   Family History:  Family History  Problem Relation Age of Onset  . Colon cancer Father   . Stomach cancer Father   . Hypertension Mother   . Hyperlipidemia Mother   . Alcohol abuse Brother   . Irritable bowel syndrome Sister   . Breast cancer Paternal Aunt   . Ovarian cancer Paternal Aunt   . Kidney cancer Neg Hx   . Kidney disease Neg Hx   . Prostate cancer Neg Hx     Social History:  Social History   Socioeconomic History  . Marital status: Married    Spouse name: Not on file  . Number of children: Not on file  . Years of education: Not on file  . Highest education level: Not on file  Occupational History  . Occupation: disabled  Social Needs  . Financial resource strain: Not hard at all  . Food insecurity    Worry: Never true    Inability: Never true  . Transportation needs    Medical: No    Non-medical: No  Tobacco Use  . Smoking status: Former Smoker    Types: Cigarettes    Quit date: 2015    Years since quitting: 5.6  . Smokeless tobacco: Never Used  Substance and Sexual Activity  . Alcohol use: No  . Drug use: No  . Sexual activity: Yes  Lifestyle  . Physical activity    Days per week: 0 days    Minutes per session: 0 min  . Stress: Very much  Relationships  . Social Musicianconnections    Talks on phone: Never    Gets together: Never    Attends religious service: More than 4 times per year    Active member of club or organization: No    Attends meetings of clubs or organizations: Never    Relationship status: Married  Other Topics Concern  . Not on file  Social History Narrative  . Not on file    Allergies:  Allergies  Allergen Reactions  . Cleocin [Clindamycin Hcl] Other (See Comments)    GI distress    Metabolic Disorder Labs: Lab Results  Component Value Date   HGBA1C 5.2 07/24/2016   MPG 103  07/24/2016   No results found for: PROLACTIN Lab Results  Component Value Date   CHOL 157 05/17/2014   TRIG 271 (H) 05/17/2014   HDL 29 (L) 05/17/2014   VLDL 54 (H) 05/17/2014   LDLCALC 74 05/17/2014   Lab Results  Component Value Date   TSH 0.05 (L) 05/17/2014    Therapeutic Level Labs: No results found for: LITHIUM No results found for: VALPROATE No components found for:  CBMZ  Current Medications: Current Outpatient Medications  Medication Sig Dispense Refill  . ALPRAZolam (XANAX) 1 MG tablet Take 1 mg by mouth 3 (three) times daily as needed for anxiety or sleep.    .Marland Kitchen  Armodafinil 250 MG tablet Take 250 mg by mouth daily.    Marland Kitchen atorvastatin (LIPITOR) 40 MG tablet Take 40 mg by mouth at bedtime.     . benztropine (COGENTIN) 1 MG tablet Take 1 mg by mouth 2 (two) times daily.     Marland Kitchen buPROPion (WELLBUTRIN XL) 300 MG 24 hr tablet Take 300 mg by mouth daily.    Marland Kitchen docusate sodium (COLACE) 100 MG capsule Take 100 mg by mouth 2 (two) times daily.     . furosemide (LASIX) 20 MG tablet Take 20 mg by mouth daily.    Marland Kitchen levothyroxine (SYNTHROID, LEVOTHROID) 100 MCG tablet Take 100 mcg by mouth daily before breakfast. Takes at bedtime    . liothyronine (CYTOMEL) 5 MCG tablet Take 5 mcg by mouth 2 (two) times daily.     . methocarbamol (ROBAXIN) 750 MG tablet Take 750 mg by mouth as needed for muscle spasms.    . Nutritional Supplements (MELATONIN PO) Take 10 mg by mouth at bedtime.     . Ospemifene (OSPHENA) 60 MG TABS Take 1 tablet by mouth daily. 30 tablet 11  . pantoprazole (PROTONIX) 40 MG tablet Take 40 mg by mouth daily.    . primidone (MYSOLINE) 50 MG tablet Take 50-100 mg by mouth See admin instructions. 50 mg daily(breakfast) , 100 mg at bedtime    . topiramate (TOPAMAX) 200 MG tablet Take 300-400 mg by mouth See admin instructions. pt take 2 tabs (400 mg) q am, and 1.5 (300 mg) hs    . ziprasidone (GEODON) 60 MG capsule Take 180 mg by mouth every evening. After dinner     No  current facility-administered medications for this visit.      Musculoskeletal:   Psychiatric Specialty Exam: ROS  There were no vitals taken for this visit.There is no height or weight on file to calculate BMI.  General Appearance: Guarded, flat, tearful and depressed via phone conversation  Eye Contact:  NA  Speech:  Clear and Coherent  Volume:  Decreased  Mood:  Depressed  Affect:  Congruent  Thought Process:  Coherent  Orientation:  Full (Time, Place, and Person)  Thought Content: Logical   Suicidal Thoughts:  No  Homicidal Thoughts:  No  Memory:  Immediate;   Fair  Judgement:  Fair  Insight:  Fair  Psychomotor Activity:  Normal  Concentration:  Concentration: Fair  Recall:  [employer] of Knowledge: Fair  Language: Fair  Akathisia:  No  Handed:  Right  AIMS (if indicated):   Assets:  Communication Skills Desire for Improvement Resilience Social Support  ADL's:  Intact  Cognition: WNL  Sleep:  Fair   Screenings: AIMS     Admission (Discharged) from 05/20/2019 in [place] [facility]  AIMS Total Score  0    AUDIT     Admission (Discharged) from 05/20/2019 in [place] [facility]  Alcohol Use Disorder Identification Test Final Score (AUDIT)  0    PHQ2-9     Counselor from 05/26/2019 in [place]  PHQ-2 Total Score  6  PHQ-9 Total Score  17       Assessment and Plan:  Continue partial hospitalization program Continue medications as directed Crisis line information was provided for safety   Treatment plan was reviewed and agreed upon by NP T. Bobby Rumpf and patient Sharyn Lull Heims's need for continued group services   [place]ill [place], NP 06/01/2019, 9:08 AM

## 2019-06-01 NOTE — Progress Notes (Addendum)
05/31/2019 11:00-12:15 Group met via Web-ex due to COVID-19 precautions  Pt attended spirituality group facilitated by Chaplain Nichols Corter, MDiv [facility]   Group focused on topic of "self-care."  Patients engaged in facilitated dialog around their perception of topic.  Utilized list of quotes to identify images of self care which which they connected and an image of self care which did not connect.  Engaged in facilitated conversation around their chosen images of self care, influences on self care, and current practices of self care in their life.    

## 2019-06-02 ENCOUNTER — Other Ambulatory Visit (HOSPITAL_COMMUNITY): Payer: Medicare Other | Admitting: Licensed Clinical Social Worker

## 2019-06-02 ENCOUNTER — Other Ambulatory Visit (HOSPITAL_COMMUNITY): Payer: Medicare Other | Admitting: Occupational Therapy

## 2019-06-02 ENCOUNTER — Other Ambulatory Visit: Payer: Self-pay

## 2019-06-02 DIAGNOSIS — F315 Bipolar disorder, current episode depressed, severe, with psychotic features: Secondary | ICD-10-CM

## 2019-06-02 DIAGNOSIS — R4589 Other symptoms and signs involving emotional state: Secondary | ICD-10-CM

## 2019-06-04 ENCOUNTER — Encounter (HOSPITAL_COMMUNITY): Payer: Self-pay | Admitting: Occupational Therapy

## 2019-06-04 NOTE — Therapy (Signed)
[facility] [address] [place], [place], [address] Phone: [phone]   Fax:  [phone]  Occupational Therapy Treatment  Patient Details  Name: Jennifer Bright MRN: [id number] Date of Birth: 11/11/1965 Referring Provider (OT): Hillery Jacksanika Lewis, NP  Virtual Visit via Video Note  I connected with Jennifer QuamMichele F Crandle on 06/04/19 at  8:00 AM EDT by a video enabled telemedicine application and verified that I am speaking with the correct person using two identifiers.   I discussed the limitations of evaluation and management by telemedicine and the availability of in person appointments. The patient expressed understanding and agreed to proceed.   I discussed the assessment and treatment plan with the patient. The patient was provided an opportunity to ask questions and all were answered. The patient agreed with the plan and demonstrated an understanding of the instructions.   The patient was advised to call back or seek an in-person evaluation if the symptoms worsen or if the condition fails to improve as anticipated.  I provided 60 minutes of non-face-to-face time during this encounter.   Dalphine HandingKaylee Brantlee Penn, OT    Encounter Date: 06/01/2019  OT End of Session - 06/04/19 1945    Visit Number  4    Number of Visits  12    Date for OT Re-Evaluation  06/25/19    Authorization Type  [employer]    OT Start Time  1100    OT Stop Time  1200    OT Time Calculation (min)  60 min    Activity Tolerance  Patient tolerated treatment well    Behavior During Therapy  WFL for tasks assessed/performed       Past Medical History:  Diagnosis Date  . Acid reflux   . Anemia   . Anxiety   . Bipolar disorder (HCC)   . Chronic kidney disease    STAGE 3  . Depression   . Diabetes mellitus    NIDD  . Essential tremor   . Family history of ovarian cancer    Pt doesn't meet Medicare genetic testing guidelines  . Hyperlipidemia   . Hypothyroid    . IBS (irritable bowel syndrome)   . Migraine headache    vestibular migraine  . Obesity   . Osteopenia   . Restless leg syndrome   . Sarcoidosis   . Sleep apnea   . Syncope \    Past Surgical History:  Procedure Laterality Date  . ABDOMINAL HYSTERECTOMY    . APPENDECTOMY    . BLADDER SURGERY     bladder tact  . CATARACT EXTRACTION W/PHACO Right 09/21/2018   Procedure: CATARACT EXTRACTION PHACO AND INTRAOCULAR LENS PLACEMENT (IOC);  Surgeon: Nevada CraneKing, Bradley Mark, MD;  Location: [facility] ORS;  Service: Ophthalmology;  Laterality: Right;  US  00:20 CDE 00.82 Fluid pack lot # [id number]  . CATARACT EXTRACTION W/PHACO Left 10/17/2018   Procedure: CATARACT EXTRACTION PHACO AND INTRAOCULAR LENS PLACEMENT (IOC)  LEFT;  Surgeon: Nevada CraneKing, Bradley Mark, MD;  [facility];  Service: Ophthalmology;  Laterality: Left;  diabetic - diet controlled  . CESAREAN SECTION    . CHOLECYSTECTOMY    . COLONOSCOPY WITH PROPOFOL N/A 04/05/2017   Procedure: COLONOSCOPY WITH PROPOFOL;  Surgeon: Christena DeemSkulskie, Martin U, MD;  [facility];  Service: Endoscopy;  Laterality: N/A;  . ESOPHAGOGASTRODUODENOSCOPY (EGD) WITH PROPOFOL N/A 08/28/2016   Procedure: ESOPHAGOGASTRODUODENOSCOPY (EGD) WITH PROPOFOL;  Surgeon: [name], MD;  [facility];  Service: Endoscopy;  Laterality: N/A;  . HERNIA REPAIR    . INGUINAL HERNIA REPAIR     rt and left  . lexiscan cardiolite    . TILT TABLE STUDY N/A 04/01/2012   Procedure: TILT TABLE STUDY;  Surgeon: Deboraha Sprang, MD;  [facility] CATH LAB;  Service: Cardiovascular;  Laterality: N/A;    There were no vitals filed for this visit.  Subjective Assessment - 06/04/19 1944    Currently in Pain?  No/denies         S: My self esteem can increase with support   O:Education given on self esteem and how it relates to daily experiences. Pt asked to give definition of self esteem, and current rating of self esteem. Positive and negative  contributors of self esteem to be brainstormed within group in relation to personal experiences. Positive thinking activity then completed for pt to identify several positive traits about themselves. Pt asked to share at end of session.  A: Pt presents with flat affect, engaged and participatory throughout session. Pt very tangential about stories regarding her family. She had issues engaging in choosing skills considering tangents.   P: OT group will be x3 per week while pt in PHP              OT Education - 06/04/19 1944    Education Details  cont education given on self esteem    Person(s) Educated  Patient    Methods  Explanation;Handout    Comprehension  Verbalized understanding       OT Short Term Goals - 05/31/19 1047      OT SHORT TERM GOAL #1   Title  Pt will be education on strategies to improve psychosocial skills needed to participate in all daily, work, and leisure activiteis    Time  4    Period  Weeks    Status  On-going    Target Date  06/25/19      OT SHORT TERM GOAL #2   Title  Pt will apply psychosocial skills and coping mechanisms to daily activities in order to function independently and reintegrate into community    Time  4    Period  Weeks    Status  On-going      OT SHORT TERM GOAL #3   Title  Pt will recall and apply 1-3 sleep hygiene strategies to improve BADL function prior to reintegrating into community    Time  4    Period  Weeks    Status  On-going      OT SHORT TERM GOAL #4   Title  Pt will recall 1-3 social participation opportunities to decrease isolation prior to reintegrating into community    Time  4    Period  Weeks    Status  On-going      OT SHORT TERM GOAL #5   Title  Pt will recall and/or apply 1-3 assertiveness skills to increase pro social contact prior to reintegrating into community    Time  4    Period  Weeks    Status  On-going               Plan - 06/04/19 1945    Occupational performance deficits  (Please refer to evaluation for details):  ADL's;IADL's;Rest and Sleep;Work;Social Participation;Leisure    Body Structure / Function / Physical Skills  ADL;IADL    Cognitive Skills  Attention;Emotional;Temperament/Personality;Thought    Psychosocial Skills  Coping Strategies;Interpersonal Interaction;Routines and Behaviors;Habits  Patient will benefit from skilled therapeutic intervention in order to improve the following deficits and impairments:   Body Structure / Function / Physical Skills: ADL, IADL Cognitive Skills: Attention, Emotional, Temperament/Personality, Thought Psychosocial Skills: Coping Strategies, Interpersonal Interaction, Routines and Behaviors, Habits   Visit Diagnosis: 1. Bipolar disorder, current episode depressed, severe, with psychotic features (HCC)   2. Difficulty coping       Problem List Patient Active Problem List   Diagnosis Date Noted  . Vaginal atrophy 07/12/2017  . Polypharmacy 08/14/2016  . 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 Hypercalcemia 07/24/2016  . Sarcoidosis 07/24/2016  . Hyperlipidemia, unspecified 11/15/2015  . Type 2 diabetes mellitus without complication, without long-term current use of insulin (HCC) 11/15/2015  . Anxiety disorder 07/29/2015  . Anxiety 05/12/2015  . Bipolar depression (HCC) 05/12/2015  . Essential (primary) hypertension 05/12/2015  . Benign essential tremor 05/12/2015  . Gastro-esophageal reflux disease without esophagitis 05/12/2015  . Cephalalgia 05/12/2015  . Hypersomnia with sleep apnea 05/12/2015  . Restless leg 05/12/2015  . Diabetes (HCC) 05/12/2015  . Hypothyroidism 05/12/2015  . Migraine variant 05/12/2015  . Bipolar disorder (HCC) 05/12/2015  . Type 2 diabetes mellitus without complications (HCC) 05/12/2015  . Chronic migraine without aura 09/03/2012  . 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 Syncope and collapse 03/17/2012  . Depression 03/17/2012  . Migraine 03/17/2012    Dalphine HandingKaylee Destyn Parfitt, MSOT, OTR/L Behavioral Health OT/ Acute Relief OT PHP Office: [phone]  Dalphine HandingKaylee Jaslyn Bansal 06/04/2019, 7:46 PM  [facility] [address] [place], [place], [address] Phone: [phone]   Fax:  [phone]  Name: Jennifer QuamMichele F Banks MRN: [id number] Date of Birth: 09/24/1966

## 2019-06-04 NOTE — Therapy (Signed)
[place], [place], [address] Phone: [phone]   Fax:  [phone]  Occupational Therapy Treatment  Patient Details  Name: Jennifer Bright MRN: [id number] Date of Birth: 05/10/1966 Referring Provider (OT): [name], NP  Virtual Visit via Video Note  I connected with Jennifer Bright on 06/04/19 at  8:00 AM EDT by a video enabled telemedicine application and verified that I am speaking with the correct person using two identifiers.   I discussed the limitations of evaluation and management by telemedicine and the availability of in person appointments. The patient expressed understanding and agreed to proceed.   I discussed the assessment and treatment plan with the patient. The patient was provided an opportunity to ask questions and all were answered. The patient agreed with the plan and demonstrated an understanding of the instructions.   The patient was advised to call back or seek an in-person evaluation if the symptoms worsen or if the condition fails to improve as anticipated.  I provided 60 minutes of non-face-to-face time during this encounter.  [name], MSOT, OTR/L Behavioral Health OT/ Acute Relief OT PHP Office: [phone]  [name], [place]    Encounter Date: 06/02/2019  OT End of Session - 06/04/19 2017    Visit Number  5    Number of Visits  12    Date for OT Re-Evaluation  06/25/19    Authorization Type  [employer]    OT Start Time  1100    OT Stop Time  1200    OT Time Calculation (min)  60 min    Activity Tolerance  Patient tolerated treatment well    Behavior During Therapy  [facility] for tasks assessed/performed       Past Medical History:  Diagnosis Date  . Acid reflux   . Anemia   . Anxiety   . Bipolar disorder ([place])   . Chronic kidney disease    STAGE 3  . Depression   . Diabetes mellitus    NIDD  . Essential tremor   . Family history of ovarian  cancer    Pt doesn't meet Medicare genetic testing guidelines  . Hyperlipidemia   . Hypothyroid   . IBS (irritable bowel syndrome)   . Migraine headache    vestibular migraine  . Obesity   . Osteopenia   . Restless leg syndrome   . Sarcoidosis   . Sleep apnea   . Syncope \    Past Surgical History:  Procedure Laterality Date  . ABDOMINAL HYSTERECTOMY    . APPENDECTOMY    . BLADDER SURGERY     bladder tact  . CATARACT EXTRACTION W/PHACO Right 09/21/2018   Procedure: CATARACT EXTRACTION PHACO AND INTRAOCULAR LENS PLACEMENT ([place]);  Surgeon: [name], MD;  Location: [facility] ORS;  Service: Ophthalmology;  Laterality: Right;  [place]  00:20 CDE 00.82 Fluid pack lot # [id number]  . CATARACT EXTRACTION W/PHACO Left 10/17/2018   Procedure: CATARACT EXTRACTION PHACO AND INTRAOCULAR LENS PLACEMENT ([place])  LEFT;  Surgeon: [name], MD;  Location: [place];  Service: Ophthalmology;  Laterality: Left;  diabetic - diet controlled  . CESAREAN SECTION    . CHOLECYSTECTOMY    . COLONOSCOPY WITH PROPOFOL N/A 04/05/2017   Procedure: COLONOSCOPY WITH PROPOFOL;  Surgeon: [name], MD;  Location: [facility] ENDOSCOPY;  Service: Endoscopy;  Laterality: N/A;  . ESOPHAGOGASTRODUODENOSCOPY (EGD) WITH PROPOFOL N/A 08/28/2016   Procedure: ESOPHAGOGASTRODUODENOSCOPY (EGD) WITH PROPOFOL;  Surgeon: [name], MD;  [facility];  Service: Endoscopy;  Laterality: N/A;  . HERNIA REPAIR    . INGUINAL HERNIA REPAIR     rt and left  . lexiscan cardiolite    . TILT TABLE STUDY N/A 04/01/2012   Procedure: TILT TABLE STUDY;  Surgeon: [name], MD;  [facility];  Service: Cardiovascular;  Laterality: N/A;    There were no vitals filed for this visit.  Subjective Assessment - 06/04/19 2016    Currently in Pain?  No/denies       S: "I suppress my stress"   O: Education given on stress management skills. Pt asked to share current unhealthy coping  mechanisms, and how they react to stress. After education, pt asked to share new preferred coping skill as of this date.   A: Pt presents with blunted affect, tangential this date. She continues to be tangential about the stress in her family, sharing many specific stories continuously without redirection. Pt shares how she holds in her stress. She shares how she would like to try relaxatoin strategies this date.  P: OT group will be x3 per week while pt in PHP             OT Education - 06/04/19 2016    Education Details  education given on stress management skills    Person(s) Educated  Patient    Methods  Explanation;Handout    Comprehension  Verbalized understanding       OT Short Term Goals - 05/31/19 1047      OT SHORT TERM GOAL #1   Title  Pt will be education on strategies to improve psychosocial skills needed to participate in all daily, work, and leisure activiteis    Time  4    Period  Weeks    Status  On-going    Target Date  06/25/19      OT SHORT TERM GOAL #2   Title  Pt will apply psychosocial skills and coping mechanisms to daily activities in order to function independently and reintegrate into community    Time  4    Period  Weeks    Status  On-going      OT SHORT TERM GOAL #3   Title  Pt will recall and apply 1-3 sleep hygiene strategies to improve BADL function prior to reintegrating into community    Time  4    Period  Weeks    Status  On-going      OT SHORT TERM GOAL #4   Title  Pt will recall 1-3 social participation opportunities to decrease isolation prior to reintegrating into community    Time  4    Period  Weeks    Status  On-going      OT SHORT TERM GOAL #5   Title  Pt will recall and/or apply 1-3 assertiveness skills to increase pro social contact prior to reintegrating into community    Time  4    Period  Weeks    Status  On-going               Plan - 06/04/19 2017    Occupational performance deficits (Please refer  to evaluation for details):  ADL's;IADL's;Rest and Sleep;Work;Social Participation;Leisure    Body Structure / Function / Physical Skills  ADL;IADL    Cognitive Skills  Attention;Emotional;Temperament/Personality;Thought    Psychosocial Skills  Coping Strategies;Interpersonal Interaction;Routines and Behaviors;Habits       Patient will benefit  from skilled therapeutic intervention in order to improve the following deficits and impairments:   Body Structure / Function / Physical Skills: ADL, IADL Cognitive Skills: Attention, Emotional, Temperament/Personality, Thought Psychosocial Skills: Coping Strategies, Interpersonal Interaction, Routines and Behaviors, Habits   Visit Diagnosis: 1. Bipolar disorder, current episode depressed, severe, with psychotic features (HCC)   2. Difficulty coping       Problem List Patient Active Problem List   Diagnosis Date Noted  . Vaginal atrophy 07/12/2017  . Polypharmacy 08/14/2016  . 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 Hypercalcemia 07/24/2016  . Sarcoidosis 07/24/2016  . Hyperlipidemia, unspecified 11/15/2015  . Type 2 diabetes mellitus without complication, without long-term current use of insulin (HCC) 11/15/2015  . Anxiety disorder 07/29/2015  . Anxiety 05/12/2015  . Bipolar depression (HCC) 05/12/2015  . Essential (primary) hypertension 05/12/2015  . Benign essential tremor 05/12/2015  . Gastro-esophageal reflux disease without esophagitis 05/12/2015  . Cephalalgia 05/12/2015  . Hypersomnia with sleep apnea 05/12/2015  . Restless leg 05/12/2015  . Diabetes (HCC) 05/12/2015  . Hypothyroidism 05/12/2015  . Migraine variant 05/12/2015  . Bipolar disorder (HCC) 05/12/2015  . Type 2 diabetes mellitus without complications (HCC) 05/12/2015  . Chronic migraine without aura 09/03/2012  . 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 Syncope and collapse 03/17/2012  . Depression 03/17/2012  . Migraine 03/17/2012   Dalphine HandingKaylee Kyndal Gloster,  MSOT, OTR/L Behavioral Health OT/ Acute Relief OT PHP Office: [phone]  Dalphine HandingKaylee Jasmynn Pfalzgraf 06/04/2019, 8:18 PM  [facility] [address] [place], [place], [address] Phone: [phone]   Fax:  [phone]  Name: Juan QuamMichele F Chevalier MRN: [id number] Date of Birth: 01/12/1966

## 2019-06-05 ENCOUNTER — Other Ambulatory Visit (HOSPITAL_COMMUNITY): Payer: Medicare Other | Admitting: Licensed Clinical Social Worker

## 2019-06-05 ENCOUNTER — Other Ambulatory Visit: Payer: Self-pay

## 2019-06-05 DIAGNOSIS — F315 Bipolar disorder, current episode depressed, severe, with psychotic features: Secondary | ICD-10-CM

## 2019-06-06 ENCOUNTER — Other Ambulatory Visit: Payer: Self-pay

## 2019-06-06 ENCOUNTER — Other Ambulatory Visit (HOSPITAL_COMMUNITY): Payer: Medicare Other | Admitting: Occupational Therapy

## 2019-06-06 ENCOUNTER — Other Ambulatory Visit (HOSPITAL_COMMUNITY): Payer: Medicare Other | Admitting: Licensed Clinical Social Worker

## 2019-06-06 DIAGNOSIS — F315 Bipolar disorder, current episode depressed, severe, with psychotic features: Secondary | ICD-10-CM

## 2019-06-06 DIAGNOSIS — R4589 Other symptoms and signs involving emotional state: Secondary | ICD-10-CM

## 2019-06-07 ENCOUNTER — Other Ambulatory Visit: Payer: Self-pay

## 2019-06-07 ENCOUNTER — Encounter (HOSPITAL_COMMUNITY): Payer: Self-pay | Admitting: Family

## 2019-06-07 ENCOUNTER — Encounter (HOSPITAL_COMMUNITY): Payer: Self-pay

## 2019-06-07 ENCOUNTER — Other Ambulatory Visit (HOSPITAL_COMMUNITY): Payer: Medicare Other | Admitting: Licensed Clinical Social Worker

## 2019-06-07 DIAGNOSIS — F315 Bipolar disorder, current episode depressed, severe, with psychotic features: Secondary | ICD-10-CM

## 2019-06-07 NOTE — Progress Notes (Signed)
Virtual Visit via Telephone Note  I connected with Jennifer Bright on 06/07/19 at  9:00 AM EDT by telephone and verified that I am speaking with the correct person using two identifiers.   I discussed the limitations, risks, security and privacy concerns of performing an evaluation and management service by telephone and the availability of in person appointments. I also discussed with the patient that there may be a patient responsible charge related to this service. The patient expressed understanding and agreed to proceed.   I discussed the assessment and treatment plan with the patient. The patient was provided an opportunity to ask questions and all were answered. The patient agreed with the plan and demonstrated an understanding of the instructions.   The patient was advised to call back or seek an in-person evaluation if the symptoms worsen or if the condition fails to improve as anticipated.  I provided 00 minutes of non-face-to-face time during this encounter.   Derrill Center, NP   BH MD/PA/NP OP Progress Note  06/07/2019 9:05 AM Jennifer Bright  MRN:  [id number]  Evaluation: Jennifer Bright was evaluated via telephone.  She appeared more engaged in the conversation than on previous assessments.  Denying suicidal or homicidal ideations.  Continues to report auditory hallucinations.  States recent follow-up with her attending psychiatrist who initiated a new medication.  However patient is unable to recall the name of the medication.  Patient appeared to be more receptive with insight regarding treatment plan.  As she reports chronic hallucinations however explains to me that she" knows it takes time for the Geodon to start working."  As she reports initiating this medication a week and a half earlier.  Patient discussed during treatment team patient continues to have mild outbursts and disruptions.  Patient reported plans to follow-up with a psychologist as recommended by her  psychiatrist.  We will continue to encourage downward progression to intensive outpatient programming.  Patient to follow-up with insurance regarding discharge disposition and follow-up care.  Support, encouragement and reassurance provided.   Visit Diagnosis: No diagnosis found.  Past Psychiatric History:   Past Medical History:  Past Medical History:  Diagnosis Date  . Acid reflux   . Anemia   . Anxiety   . Bipolar disorder ([place])   . Chronic kidney disease    STAGE 3  . Depression   . Diabetes mellitus    NIDD  . Essential tremor   . Family history of ovarian cancer    Pt doesn't meet Medicare genetic testing guidelines  . Hyperlipidemia   . Hypothyroid   . IBS (irritable bowel syndrome)   . Migraine headache    vestibular migraine  . Obesity   . Osteopenia   . Restless leg syndrome   . Sarcoidosis   . Sleep apnea   . Syncope \    Past Surgical History:  Procedure Laterality Date  . ABDOMINAL HYSTERECTOMY    . APPENDECTOMY    . BLADDER SURGERY     bladder tact  . CATARACT EXTRACTION W/PHACO Right 09/21/2018   Procedure: CATARACT EXTRACTION PHACO AND INTRAOCULAR LENS PLACEMENT ([place]);  Surgeon: [name], MD;  Location: [facility] ORS;  Service: Ophthalmology;  Laterality: Right;  [place]  00:20 CDE 00.82 Fluid pack lot # [id number]  . CATARACT EXTRACTION W/PHACO Left 10/17/2018   Procedure: CATARACT EXTRACTION PHACO AND INTRAOCULAR LENS PLACEMENT ([place])  LEFT;  Surgeon: [name], MD;  Location: [facility];  Service: Ophthalmology;  Laterality: Left;  diabetic - diet controlled  . CESAREAN SECTION    . CHOLECYSTECTOMY    . COLONOSCOPY WITH PROPOFOL N/A 04/05/2017   Procedure: COLONOSCOPY WITH PROPOFOL;  Surgeon: Christena DeemSkulskie, Martin U, MD;  [facility];  Service: Endoscopy;  Laterality: N/A;  . ESOPHAGOGASTRODUODENOSCOPY (EGD) WITH PROPOFOL N/A 08/28/2016   Procedure: ESOPHAGOGASTRODUODENOSCOPY (EGD) WITH PROPOFOL;  Surgeon: [name],  MD;  [facility];  Service: Endoscopy;  Laterality: N/A;  . HERNIA REPAIR    . INGUINAL HERNIA REPAIR     rt and left  . lexiscan cardiolite    . TILT TABLE STUDY N/A 04/01/2012   Procedure: TILT TABLE STUDY;  Surgeon: [name], MD;  [facility];  Service: Cardiovascular;  Laterality: N/A;    Family Psychiatric History:   Family History:  Family History  Problem Relation Age of Onset  . Colon cancer Father   . Stomach cancer Father   . Hypertension Mother   . Hyperlipidemia Mother   . Alcohol abuse Brother   . Irritable bowel syndrome Sister   . Breast cancer Paternal Aunt   . Ovarian cancer Paternal Aunt   . Kidney cancer Neg Hx   . Kidney disease Neg Hx   . Prostate cancer Neg Hx     Social History:  Social History   Socioeconomic History  . Marital status: Married    Spouse name: Not on file  . Number of children: Not on file  . Years of education: Not on file  . Highest education level: Not on file  Occupational History  . Occupation: disabled  Social Needs  . Financial resource strain: Not hard at all  . Food insecurity    Worry: Never true    Inability: Never true  . Transportation needs    Medical: No    Non-medical: No  Tobacco Use  . Smoking status: Former Smoker    Types: Cigarettes    Quit date: 2015    Years since quitting: 5.6  . Smokeless tobacco: Never Used  Substance and Sexual Activity  . Alcohol use: No  . Drug use: No  . Sexual activity: Yes  Lifestyle  . Physical activity    Days per week: 0 days    Minutes per session: 0 min  . Stress: Very much  Relationships  . Social Musicianconnections    Talks on phone: Never    Gets together: Never    Attends religious service: More than 4 times per year    Active member of club or organization: No    Attends meetings of clubs or organizations: Never    Relationship status: Married  Other Topics Concern  . Not on file  Social History Narrative  . Not on file     Allergies:  Allergies  Allergen Reactions  . Cleocin [Clindamycin Hcl] Other (See Comments)    GI distress    Metabolic Disorder Labs: Lab Results  Component Value Date   HGBA1C 5.2 07/24/2016   MPG 103 07/24/2016   No results found for: PROLACTIN Lab Results  Component Value Date   CHOL 157 05/17/2014   TRIG 271 (H) 05/17/2014   HDL 29 (L) 05/17/2014   VLDL 54 (H) 05/17/2014   LDLCALC 74 05/17/2014   Lab Results  Component Value Date   TSH 0.05 (L) 05/17/2014    Therapeutic Level Labs: No results found for: LITHIUM No results found for: VALPROATE No components found for:  CBMZ  Current Medications: Current Outpatient Medications  Medication Sig Dispense Refill  . ALPRAZolam (XANAX) 1 MG tablet Take 1 mg by mouth 3 (three) times daily as needed for anxiety or sleep.    . Armodafinil 250 MG tablet Take 250 mg by mouth daily.    Marland Kitchen. atorvastatin (LIPITOR) 40 MG tablet Take 40 mg by mouth at bedtime.     . benztropine (COGENTIN) 1 MG tablet Take 1 mg by mouth 2 (two) times daily.     Marland Kitchen. buPROPion (WELLBUTRIN XL) 300 MG 24 hr tablet Take 300 mg by mouth daily.    Marland Kitchen. docusate sodium (COLACE) 100 MG capsule Take 100 mg by mouth 2 (two) times daily.     . furosemide (LASIX) 20 MG tablet Take 20 mg by mouth daily.    Marland Kitchen. levothyroxine (SYNTHROID, LEVOTHROID) 100 MCG tablet Take 100 mcg by mouth daily before breakfast. Takes at bedtime    . liothyronine (CYTOMEL) 5 MCG tablet Take 5 mcg by mouth 2 (two) times daily.     . methocarbamol (ROBAXIN) 750 MG tablet Take 750 mg by mouth as needed for muscle spasms.    . Nutritional Supplements (MELATONIN PO) Take 10 mg by mouth at bedtime.     . Ospemifene (OSPHENA) 60 MG TABS Take 1 tablet by mouth daily. 30 tablet 11  . pantoprazole (PROTONIX) 40 MG tablet Take 40 mg by mouth daily.    . primidone (MYSOLINE) 50 MG tablet Take 50-100 mg by mouth See admin instructions. 50 mg daily(breakfast) , 100 mg at bedtime    . topiramate  (TOPAMAX) 200 MG tablet Take 300-400 mg by mouth See admin instructions. pt take 2 tabs (400 mg) q am, and 1.5 (300 mg) hs    . ziprasidone (GEODON) 60 MG capsule Take 180 mg by mouth every evening. After dinner     No current facility-administered medications for this visit.      Musculoskeletal:   Psychiatric Specialty Exam: ROS  There were no vitals taken for this visit.There is no height or weight on file to calculate BMI.  General Appearance: NA  Eye Contact:  NA  Speech:  Clear and Coherent  Volume:  Normal  Mood:  Anxious and Depressed  Affect:  Congruent  Thought Process:  Coherent  Orientation:  Full (Time, Place, and Person)  Thought Content: Hallucinations: Auditory   Suicidal Thoughts:  No  Homicidal Thoughts:  No  Memory:  Immediate;   Fair Recent;   Fair  Judgement:  Fair  Insight:  Fair  Psychomotor Activity:  Normal  Concentration:  Concentration: Fair  Recall:  FiservFair  Fund of Knowledge: Fair  Language: Fair  Akathisia:  NA  Handed:  Right  AIMS (if indicated):   Assets:  Communication Skills Desire for Improvement Resilience Social Support  ADL's:  Intact  Cognition: WNL  Sleep:  NA   Screenings: AIMS     [facility]  AIMS Total Score  0    AUDIT     Ad[facility] ([facility]  Alcohol Use Disorder Identification Test Final Score (AUDIT)  0    PHQ2-9     Counselor from [facility]/26/2019 [facility]  PHQ-2 Total Score  6  PHQ-9 Total Score  17       Assessment and Plan:  Continue Partial Hospitalization programing Follow-up with treatment team for discharge disposition  Treatment plane was reviewed and agreed upon by NP  T.Melvyn NethLewis and patient Jennifer DusterMichelle Bright's need for continued group services   Oneta Rackanika N Abbagayle Zaragoza, NP 06/07/2019, 9:05 AM

## 2019-06-07 NOTE — Progress Notes (Signed)
Spoke with patient via telephone call due to limited knowledge of Engineer, mining. Patient states she is enjoying the groups and would like more help in how to deal with her difficult family. She realizes that she isolates from them because they don't understand her mental illness. She continues to hear voices but they are not command. They say "we are coming," and when in public they whisper to her like they are right at her neck. She can't understand the whispers. Geodon has helped a lot. She is still unable to review her medications because her husband keeps track of all her meds and keeps them locked. She knows she takes Geodon and Xanax but that's all she can remember. Denies SI/HI. On scale 1-10 as 10 being worst she rates depression at 6 and anxiety at 6. She does worry a little about being discharged from [facility] but says she will do whatever is recommended or what her insurance will pay for. Not being discharged until next week. She enjoys the groups since she has no friends and sits alone all day. Slept good last night and states that was the first time in several weeks she has slept so well. No issues or complaints.

## 2019-06-07 NOTE — Therapy (Signed)
[facility] [address] [place], [place], [address] Phone: [phone]   Fax:  [phone]  Occupational Therapy Treatment  Patient Details  Name: Jennifer QuamMichele F January MRN: [id number] Date of Birth: 07/15/1966 Referring Provider (OT): Hillery Jacksanika Lewis, NP  Virtual Visit via Video Note  I connected with Jennifer Bright on 06/07/19 at  8:00 AM EDT by a video enabled telemedicine application and verified that I am speaking with the correct person using two identifiers.   I discussed the limitations of evaluation and management by telemedicine and the availability of in person appointments. The patient expressed understanding and agreed to proceed.   I discussed the assessment and treatment plan with the patient. The patient was provided an opportunity to ask questions and all were answered. The patient agreed with the plan and demonstrated an understanding of the instructions.   The patient was advised to call back or seek an in-person evaluation if the symptoms worsen or if the condition fails to improve as anticipated.  I provided 60 minutes of non-face-to-face time during this encounter.  Dalphine HandingKaylee Samiyyah Moffa, MSOT, OTR/L Behavioral Health OT/ Acute Relief OT PHP Office: [phone]  Dalphine HandingKaylee Stacyann Mcconaughy, [place]    Encounter Date: 06/06/2019  OT End of Session - 06/07/19 1149    Visit Number  6    Number of Visits  12    Date for OT Re-Evaluation  06/25/19    Authorization Type  [employer]    OT Start Time  1100    OT Stop Time  1200    OT Time Calculation (min)  60 min    Activity Tolerance  Patient tolerated treatment well    Behavior During Therapy  WFL for tasks assessed/performed       Past Medical History:  Diagnosis Date  . Acid reflux   . Anemia   . Anxiety   . Bipolar disorder (HCC)   . Chronic kidney disease    STAGE 3  . Depression   . Diabetes mellitus    NIDD  . Essential tremor   . Family history of ovarian  cancer    Pt doesn't meet Medicare genetic testing guidelines  . Hyperlipidemia   . Hypothyroid   . IBS (irritable bowel syndrome)   . Migraine headache    vestibular migraine  . Obesity   . Osteopenia   . Restless leg syndrome   . Sarcoidosis   . Sleep apnea   . Syncope \    Past Surgical History:  Procedure Laterality Date  . ABDOMINAL HYSTERECTOMY    . APPENDECTOMY    . BLADDER SURGERY     bladder tact  . CATARACT EXTRACTION W/PHACO Right 09/21/2018   Procedure: CATARACT EXTRACTION PHACO AND INTRAOCULAR LENS PLACEMENT ([facility]);  Surgeon: Nevada CraneKing, Bradley Mark, MD;  Location: [facility] ORS;  Service: Ophthalmology;  Laterality: Right;  [place]  00:20 CDE 00.82 Fluid pack lot # [id number]  . CATARACT EXTRACTION W/PHACO Left 10/17/2018   Procedure: CATARACT EXTRACTION PHACO AND INTRAOCULAR LENS PLACEMENT ([facility])  LEFT;  Surgeon: Nevada CraneKing, Bradley Mark, MD;  [facility];  Service: Ophthalmology;  Laterality: Left;  diabetic - diet controlled  . CESAREAN SECTION    . CHOLECYSTECTOMY    . COLONOSCOPY WITH PROPOFOL N/A 04/05/2017   Procedure: COLONOSCOPY WITH PROPOFOL;  Surgeon: Christena DeemSkulskie, Martin U, MD;  [facility];  Service: Endoscopy;  Laterality: N/A;  . ESOPHAGOGASTRODUODENOSCOPY (EGD) WITH PROPOFOL N/A 08/28/2016   Procedure: ESOPHAGOGASTRODUODENOSCOPY (EGD) WITH PROPOFOL;  Surgeon: [name], MD;  Location: [facility] ENDOSCOPY;  Service: Endoscopy;  Laterality: N/A;  . HERNIA REPAIR    . INGUINAL HERNIA REPAIR     rt and left  . lexiscan cardiolite    . TILT TABLE STUDY N/A 04/01/2012   Procedure: TILT TABLE STUDY;  Surgeon: Deboraha Sprang, MD;  [facility] C[facility] LAB;  Service: Cardiovascular;  Laterality: N/A;    There were no vitals filed for this visit.  Subjective Assessment - 06/07/19 1149    Currently in Pain?  No/denies         S: This applies to speaking with my family   O: Education given on verbal communication skills this date. Fair fighting  rules discussed in reference to a variety of relationships. Pt asked to apply certain rules to a current situation in life. Video clip of a relationship argument shown for pts to apply fair fighting rules and discuss with group."I statements" worksheet given to provide education on appropriate communication styles when in emotional situations. Pt asked to apply personal example of I statement at end of session.    A: Pt presents to group, labile, engaged but continues to be tangential. Pt continues to repeat similar stories regarding her family situations but denies help when offered or states how such coping skill would not work for her. Continued to encourage and empower pt to change through communication skills, pt continues to be resistant and stating passive behavior to avoid conflict.  P: OT group will be x3 per week while pt in PHP               OT Education - 06/07/19 1149    Education Details  education given on communication skills    Person(s) Educated  Patient    Methods  Explanation;Handout    Comprehension  Verbalized understanding       OT Short Term Goals - 05/31/19 1047      OT SHORT TERM GOAL #1   Title  Pt will be education on strategies to improve psychosocial skills needed to participate in all daily, work, and leisure activiteis    Time  4    Period  Weeks    Status  On-going    Target Date  06/25/19      OT SHORT TERM GOAL #2   Title  Pt will apply psychosocial skills and coping mechanisms to daily activities in order to function independently and reintegrate into community    Time  4    Period  Weeks    Status  On-going      OT SHORT TERM GOAL #3   Title  Pt will recall and apply 1-3 sleep hygiene strategies to improve BADL function prior to reintegrating into community    Time  4    Period  Weeks    Status  On-going      OT SHORT TERM GOAL #4   Title  Pt will recall 1-3 social participation opportunities to decrease isolation prior to  reintegrating into community    Time  4    Period  Weeks    Status  On-going      OT SHORT TERM GOAL #5   Title  Pt will recall and/or apply 1-3 assertiveness skills to increase pro social contact prior to reintegrating into community    Time  4    Period  Weeks    Status  On-going  Plan - 06/07/19 1149    Occupational performance deficits (Please refer to evaluation for details):  ADL's;IADL's;Rest and Sleep;Work;Social Participation;Leisure    Body Structure / Function / Physical Skills  ADL;IADL    Cognitive Skills  Attention;Emotional;Temperament/Personality;Thought    Psychosocial Skills  Coping Strategies;Interpersonal Interaction;Routines and Behaviors;Habits       Patient will benefit from skilled therapeutic intervention in order to improve the following deficits and impairments:   Body Structure / Function / Physical Skills: ADL, IADL Cognitive Skills: Attention, Emotional, Temperament/Personality, Thought Psychosocial Skills: Coping Strategies, Interpersonal Interaction, Routines and Behaviors, Habits   Visit Diagnosis: 1. Bipolar disorder, current episode depressed, severe, with psychotic features (HCC)   2. Difficulty coping       Problem List Patient Active Problem List   Diagnosis Date Noted  . Vaginal atrophy 07/12/2017  . Polypharmacy 08/14/2016  . 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 Hypercalcemia 07/24/2016  . Sarcoidosis 07/24/2016  . Hyperlipidemia, unspecified 11/15/2015  . Type 2 diabetes mellitus without complication, without long-term current use of insulin (HCC) 11/15/2015  . Anxiety disorder 07/29/2015  . Anxiety 05/12/2015  . Bipolar depression (HCC) 05/12/2015  . Essential (primary) hypertension 05/12/2015  . Benign essential tremor 05/12/2015  . Gastro-esophageal reflux disease without esophagitis 05/12/2015  . Cephalalgia 05/12/2015  . Hypersomnia with sleep apnea 05/12/2015  . Restless leg 05/12/2015  .  Diabetes (HCC) 05/12/2015  . Hypothyroidism 05/12/2015  . Migraine variant 05/12/2015  . Bipolar disorder (HCC) 05/12/2015  . Type 2 diabetes mellitus without complications (HCC) 05/12/2015  . Chronic migraine without aura 09/03/2012  . 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 Syncope and collapse 03/17/2012  . Depression 03/17/2012  . Migraine 03/17/2012   Dalphine HandingKaylee Delina Kruczek, MSOT, OTR/L Behavioral Health OT/ Acute Relief OT PHP Office: [phone]  Dalphine HandingKaylee Jasmain Ahlberg 06/07/2019, 11:50 AM  [facility] [address] [place], [place], [address] Phone: [phone]   Fax:  [phone]  Name: Jennifer Bright MRN: [id number] Date of Birth: 10/31/1965

## 2019-06-08 ENCOUNTER — Other Ambulatory Visit (HOSPITAL_COMMUNITY): Payer: Medicare Other | Admitting: Occupational Therapy

## 2019-06-08 ENCOUNTER — Other Ambulatory Visit (HOSPITAL_COMMUNITY): Payer: Medicare Other | Admitting: Licensed Clinical Social Worker

## 2019-06-08 ENCOUNTER — Other Ambulatory Visit: Payer: Self-pay

## 2019-06-08 ENCOUNTER — Encounter (HOSPITAL_COMMUNITY): Payer: Self-pay

## 2019-06-08 DIAGNOSIS — R4589 Other symptoms and signs involving emotional state: Secondary | ICD-10-CM

## 2019-06-08 DIAGNOSIS — F315 Bipolar disorder, current episode depressed, severe, with psychotic features: Secondary | ICD-10-CM

## 2019-06-08 NOTE — Psych (Signed)
Virtual Visit via Video Note  I connected with Jennifer Bright on 05/26/19 at  9:00 AM EDT by a video enabled telemedicine application and verified that I am speaking with the correct person using two identifiers.   I discussed the limitations of evaluation and management by telemedicine and the availability of in person appointments. The patient expressed understanding and agreed to proceed.   I discussed the assessment and treatment plan with the patient. The patient was provided an opportunity to ask questions and all were answered. The patient agreed with the plan and demonstrated an understanding of the instructions.   The patient was advised to call back or seek an in-person evaluation if the symptoms worsen or if the condition fails to improve as anticipated.  Pt was provided 240 minutes of non-face-to-face time during this encounter.   Lorin Glass, LCSW    [facility] PHP THERAPIST PROGRESS NOTE  EMMALOU Bright [id number]  Session Time: 9:00 - 10:00  Participation Level: Active  Behavioral Response: CasualAlertDepressed  Type of Therapy: Group Therapy  Treatment Goals addressed: Coping  Interventions: CBT, DBT, Supportive and Reframing  Summary: Clinician led check-in regarding current stressors and situation, and review of patient completed daily inventory. Clinician utilized active listening and empathetic response and validated patient emotions. Clinician facilitated processing group on pertinent issues.   Therapist Response: Jennifer Bright is a 53 y.o. female who presents with depression and anxiety symptoms. Patient arrived within time allowed and reports that she is feeling "stressed." Patient rates her mood at a 4 on a scale of 1-10 with 10 being great. Pt reports she had a "melt down" trying to get logged on this morning, and her husband was able to help her. Pt reports her afternoon was "okay" and that she tried a new sleep hygiene skill that she learned  in OT yesterday. Pt states she did not see immediate results and has been up since 2:30 am. Pt states she spent the time well and researched enrichment classes at the community college to fill her time, however is already doubting that it will be an option for her. Cln recommended pt consider support groups and directed her to [facility].  Pt engaged in discussion.      Session Time: 10:00 -11:00  Participation Level: Active  Behavioral Response: CasualAlertAnxious  Type of Therapy: Group Therapy, psychoeducation, psychotherapy  Treatment Goals addressed: Coping  Interventions: CBT, DBT, Solution Focused, Supportive and Reframing  Summary:  Cln led discussion on trust and how to build new relationships. Group members discussed issues they have with trusting others and what level of support they currently have. Group discussed ways in which their habits may be counterproductive to building a healthy relationship.   Therapist Response: Patient participated in discussion and reports she does not have supportive relationships outside of her husband. Pt states she does not trust others because "all they do is hurt me."  Pt is focused on other's actions against her and states "I just need to get them to see what they are doing to me." Pt demonstrates low insight into area of accountability in her relationships.      Session Time: 11:00- 12:00  Participation Level: Active  Behavioral Response: CasualAlertAnxious  Type of Therapy: Group Therapy, Psychoeducation; Psychotherapy  Treatment Goals addressed: Coping  Interventions: CBT; Solution focused; Supportive; Reframing  Summary:Clinician introduced topic of cognitive distortions. Cln educated on what cognitive distortions are and how they affect [place]. Cln introduced "Catch, Challenge, Change" model as a  way to modify behaviors and how to utilize it.    Therapist Response: Pt participated in discussion and reports  understanding of topic.       Session Time: 12:00 -1:00  Participation Level:Active  Behavioral Response:CasualAlertAnxious  Type of Therapy: Group Therapy, OT  Treatment Goals addressed: Coping  Interventions:Psychosocial skills training, Supportive,   Summary:12:00 - 12:50:Occupational Therapy group 12:50 -1:00 Clinician led check-out. Clinician assessed for immediate needs, medication compliance and efficacy, and safety concerns   Therapist Response: 12:00 - 12:50: Patient engaged in group. See OT note.  12:50 - 1:00: At check-out, patient rates her mood at a 5.5 on a scale of 1-10 with 10 being great. Patient reports no specific afternoon plans and will spend time with her husband over the weekend. Patient demonstrates some progress as evidenced by researching prosocial activities. Patient denies SI/HI/self-harm at the end of group.    Suicidal/Homicidal: Nowithout intent/plan  Plan: Pt will continue in PHP while working to stabilize mood and increase ability to self-manage her symptoms as they arise.   Diagnosis: Bipolar disorder, current episode depressed, severe, with psychotic features (HCC) [F31.5]    1. Bipolar disorder, current episode depressed, severe, with psychotic features (HCC)       Jennifer GuilesJenny Xochilt Conant, LCSW 06/08/2019

## 2019-06-08 NOTE — Progress Notes (Addendum)
Pt attended spiritual care group 06/07/2019 11:00-12:00.  Group met via web-ex due to COVID-19 precautions.  Group facilitated by Simone Curia, MDiv, [place]   Group focused on topic of "community."  Members reflected on topic in facilitated dialog, identifying responses to topic and notions they hold of community from their previous experience.  Group members utilized value sort cards to identify top qualities they look for in community.  Engaged in facilitated dialog around their value choices, noting origin of these values, how these are realized in their lives, and strategies for engaging these values.    Spiritual care group drew on Motivational Interviewing, Narrative and Adlerian modalities.  Patient Progress:  Jennifer Bright was present throughout group.  Engaged actively in group discussion.   She identified "being seen, care, and vocation" as elements in community. In discussion, she notes the dynamic of longing for the care she gives to others, but not wanting to ask for it.  Also noted difficulty of offering the care to herself that she offers to others and described her best community as starting from "self-acceptance."  She engaged with another group member around changes in their faith and church community.      Jerene Pitch, MDiv, [facility]

## 2019-06-08 NOTE — Psych (Signed)
Virtual Visit via Video Note  I connected with Jennifer Bright on 05/25/19 at  9:00 AM EDT by a video enabled telemedicine application and verified that I am speaking with the correct person using two identifiers.   I discussed the limitations of evaluation and management by telemedicine and the availability of in person appointments. The patient expressed understanding and agreed to proceed.   I discussed the assessment and treatment plan with the patient. The patient was provided an opportunity to ask questions and all were answered. The patient agreed with the plan and demonstrated an understanding of the instructions.   The patient was advised to call back or seek an in-person evaluation if the symptoms worsen or if the condition fails to improve as anticipated.  Pt was provided 240 minutes of non-face-to-face time during this encounter.   Lorin Glass, LCSW    [facility] PHP THERAPIST PROGRESS NOTE  Jennifer Bright [id number]  Session Time: 9:00 - 10:00  Participation Level: Active  Behavioral Response: CasualAlertDepressed  Type of Therapy: Group Therapy  Treatment Goals addressed: Coping  Interventions: CBT, DBT, Supportive and Reframing  Summary: Clinician led check-in regarding current stressors and situation, and review of patient completed daily inventory. Clinician utilized active listening and empathetic response and validated patient emotions. Clinician facilitated processing group on pertinent issues.   Therapist Response: Jennifer Bright is a 53 y.o. female who presents with depression and anxiety symptoms. Patient arrived within time allowed and reports that she is feeling "I don't know." Patient states she cannot rate her mood on a scale of 1-10 with 10 being great. Pt is tearful. Pt shares that listening to another group member share about her supportive friend upset her because pt wants support like that and does not feel she has it. Pt reports struggles  with her family and that she is and "outcast" to them. Pt states "they do not treat me well" and that they don't believe "anything I say." Pt gives examples of her children/grandchildren not returning her calls, not saying thank you for gifts, and not wanting to hug her. Pt remains tearful, talks at length, and is difficult to redirect. Pt engaged in discussion.      Session Time: 10:00 - 11:00   Participation Level: Active   Behavioral Response: CasualAlertDepressed   Type of Therapy: Group Therapy, Psychotherapy   Treatment Goals addressed: Coping   Interventions: CBT, DBT, Solution focused, Supportive, Reframing   Summary: Cln led discussion on personalization. Cln provided education on common pitfalls in personalizing other people's behaviors and how it can negatively impact [place]. Group members shared ways in which they can see this play out in their lives and processed issues that may be connected to personalization.    Therapist Response: Patient engaged in discussion. Pt identifies that she may personalize when it comes to her family. Pt struggles to stay on topic and easily wanders into sharing the ways in which she feels hurt by her family. Pt demonstrates low insight into her relationships.      Session Time: 11:00 - 12:00   Participation Level: Active   Behavioral Response: CasualAlertDepressed   Type of Therapy: Group Therapy, Psychoeducation, Psychotherapy   Treatment Goals addressed: Coping   Interventions: CBT, DBT, Solution focused, Supportive, Reframing   Summary: Cln led DBT House activity. Group members identified feelings and things they want more of and how to achieve that as well as protections, supports, and values they have in their lives. Group discussed how  they could use this activity to improve goal directed behaviors and gratitudes.    Therapist Response: Pt participated in discussion and activity. Pt identified wanting to increase feelings of love  and acceptance.        Session Time: 12:00- 1:00   Participation Level: Active   Behavioral Response: CasualAlertDepressed   Type of Therapy: Group Therapy, OT   Treatment Goals addressed: Coping   Interventions: Psychosocial skills training, Supportive    Summary:  12:00 - 12:50 Occupational Therapy group 12:50 -1:00 Clinician led check-out. Clinician assessed for immediate needs, medication compliance and efficacy, and safety concerns   Therapist Response: Patient engaged in group. See OT note. At check-out, patient rates her mood at a 4.5 on a scale of 1-10 with 10 being great. Patient reports afternoon plans of going to a doctor's appointment. Patient demonstrates some progress as evidenced by participation in first group session. Patient denies SI/HI/self-harm thoughts at the end of group.    Suicidal/Homicidal: Nowithout intent/plan  Plan: Pt will continue in PHP while working to stabilize mood and increase ability to self-manage her symptoms as they arise.   Diagnosis: Bipolar disorder, current episode depressed, severe, with psychotic features (HCC) [F31.5]    1. Bipolar disorder, current episode depressed, severe, with psychotic features (HCC)       Jennifer GuilesJenny Lismary Kiehn, LCSW 06/08/2019

## 2019-06-08 NOTE — Therapy (Signed)
[facility] [address] [place], [place], [address] Phone: [phone]   Fax:  [phone]  Occupational Therapy Treatment  Patient Details  Name: Jennifer QuamMichele F Tudor MRN: [id number] Date of Birth: 09/20/1966 Referring Provider (OT): Hillery Jacksanika Lewis, NP  Virtual Visit via Video Note  I connected with Jennifer QuamMichele F Yamaguchi on 06/08/19 at  8:00 AM EDT by a video enabled telemedicine application and verified that I am speaking with the correct person using two identifiers.   I discussed the limitations of evaluation and management by telemedicine and the availability of in person appointments. The patient expressed understanding and agreed to proceed.  I discussed the assessment and treatment plan with the patient. The patient was provided an opportunity to ask questions and all were answered. The patient agreed with the plan and demonstrated an understanding of the instructions.   The patient was advised to call back or seek an in-person evaluation if the symptoms worsen or if the condition fails to improve as anticipated.  I provided 60 minutes of non-face-to-face time during this encounter.   Dalphine HandingKaylee Chiana Wamser, OT    Encounter Date: 06/08/2019  OT End of Session - 06/08/19 1701    Visit Number  7    Number of Visits  12    Date for OT Re-Evaluation  06/25/19    Authorization Type  [employer]    OT Start Time  1100    OT Stop Time  1200    OT Time Calculation (min)  60 min    Activity Tolerance  Patient tolerated treatment well    Behavior During Therapy  WFL for tasks assessed/performed       Past Medical History:  Diagnosis Date  . Acid reflux   . Anemia   . Anxiety   . Bipolar disorder (HCC)   . Chronic kidney disease    STAGE 3  . Depression   . Diabetes mellitus    NIDD  . Essential tremor   . Family history of ovarian cancer    Pt doesn't meet Medicare genetic testing guidelines  . Hyperlipidemia   . Hypothyroid    . IBS (irritable bowel syndrome)   . Migraine headache    vestibular migraine  . Obesity   . Osteopenia   . Restless leg syndrome   . Sarcoidosis   . Sleep apnea   . Syncope \    Past Surgical History:  Procedure Laterality Date  . ABDOMINAL HYSTERECTOMY    . APPENDECTOMY    . BLADDER SURGERY     bladder tact  . CATARACT EXTRACTION W/PHACO Right 09/21/2018   Procedure: CATARACT EXTRACTION PHACO AND INTRAOCULAR LENS PLACEMENT (IOC);  Surgeon: [place] CraneKing, Bradley Mark, MD;  Location: [facility] ORS;  Service: Ophthalmology;  Laterality: Right;  US  00:20 CDE 00.82 Fluid pack lot # [id number]  . CATARACT EXTRACTION W/PHACO Left 10/17/2018   Procedure: CATARACT EXTRACTION PHACO AND INTRAOCULAR LENS PLACEMENT (IOC)  LEFT;  Surgeon: Nevada CraneKing, Bradley Mark, MD;  [facility];  Service: Ophthalmology;  Laterality: Left;  diabetic - diet controlled  . CESAREAN SECTION    . CHOLECYSTECTOMY    . COLONOSCOPY WITH PROPOFOL N/A 04/05/2017   Procedure: COLONOSCOPY WITH PROPOFOL;  Surgeon: Christena DeemSkulskie, Martin U, MD;  [facility];  Service: Endoscopy;  Laterality: N/A;  . ESOPHAGOGASTRODUODENOSCOPY (EGD) WITH PROPOFOL N/A 08/28/2016   Procedure: ESOPHAGOGASTRODUODENOSCOPY (EGD) WITH PROPOFOL;  Surgeon: [name], MD;  [facility];  Service: Endoscopy;  Laterality:  N/A;  . HERNIA REPAIR    . INGUINAL HERNIA REPAIR     rt and left  . lexiscan cardiolite    . TILT TABLE STUDY N/A 04/01/2012   Procedure: TILT TABLE STUDY;  Surgeon: [name], MD;  [facility];  Service: Cardiovascular;  Laterality: N/A;    There were no vitals filed for this visit.  Subjective Assessment - 06/08/19 1701    Currently in Pain?  No/denies         S: I am mostly passive   O: Education and activities given in reference to increase assertiveness skills within daily life and relationships. Further education given on assertive conversation, situations, body language, and  appropriate context for skill. Pt asked to identify one area to increase assertiveness this date. Further education given on the importance of assertiveness mentors and online research to continue skill building in this area increase quality of life.    A: Pt presents to group tearful on a few occasions, stating that she feels taken advantage of by many people. She mentions how her ability to say 'no' to others had lead her to some difficult situations in which she is then repeatedly asked unreasonable requests from others. She shares that this session mostly helped her to think about how to communicate with her children, and the idea of boundaries. She mentioned her husband as her Hotel managerassertiveness mentor.  P: OT Group will be x3 per week while pt in PHP              OT Education - 06/08/19 1701    Education Details  education given on assertiveness communication    Person(s) Educated  Patient    Methods  Explanation;Handout    Comprehension  Verbalized understanding       OT Short Term Goals - 05/31/19 1047      OT SHORT TERM GOAL #1   Title  Pt will be education on strategies to improve psychosocial skills needed to participate in all daily, work, and leisure activiteis    Time  4    Period  Weeks    Status  On-going    Target Date  06/25/19      OT SHORT TERM GOAL #2   Title  Pt will apply psychosocial skills and coping mechanisms to daily activities in order to function independently and reintegrate into community    Time  4    Period  Weeks    Status  On-going      OT SHORT TERM GOAL #3   Title  Pt will recall and apply 1-3 sleep hygiene strategies to improve BADL function prior to reintegrating into community    Time  4    Period  Weeks    Status  On-going      OT SHORT TERM GOAL #4   Title  Pt will recall 1-3 social participation opportunities to decrease isolation prior to reintegrating into community    Time  4    Period  Weeks    Status  On-going      OT  SHORT TERM GOAL #5   Title  Pt will recall and/or apply 1-3 assertiveness skills to increase pro social contact prior to reintegrating into community    Time  4    Period  Weeks    Status  On-going               Plan - 06/08/19 1701    Occupational performance deficits (Please  refer to evaluation for details):  ADL's;IADL's;Rest and Sleep;Work;Social Participation;Leisure    Body Structure / Function / Physical Skills  ADL;IADL    Cognitive Skills  Attention;Emotional;Temperament/Personality;Thought    Psychosocial Skills  Coping Strategies;Interpersonal Interaction;Routines and Behaviors;Habits       Patient will benefit from skilled therapeutic intervention in order to improve the following deficits and impairments:   Body Structure / Function / Physical Skills: ADL, IADL Cognitive Skills: Attention, Emotional, Temperament/Personality, Thought Psychosocial Skills: Coping Strategies, Interpersonal Interaction, Routines and Behaviors, Habits   Visit Diagnosis: Bipolar disorder, current episode depressed, severe, with psychotic features ([place])  Difficulty coping    Problem List Patient Active Problem List   Diagnosis Date Noted  . Vaginal atrophy 07/12/2017  . Polypharmacy 08/14/2016  . 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 Hypercalcemia 07/24/2016  . Sarcoidosis 07/24/2016  . Hyperlipidemia, unspecified 11/15/2015  . Type 2 diabetes mellitus without complication, without long-term current use of insulin ([place]) 11/15/2015  . Anxiety disorder 07/29/2015  . Anxiety 05/12/2015  . Bipolar depression ([place]) 05/12/2015  . Essential (primary) hypertension 05/12/2015  . Benign essential tremor 05/12/2015  . Gastro-esophageal reflux disease without esophagitis 05/12/2015  . Cephalalgia 05/12/2015  . Hypersomnia with sleep apnea 05/12/2015  . Restless leg 05/12/2015  . Diabetes ([place]) 05/12/2015  . Hypothyroidism 05/12/2015  . Migraine variant 05/12/2015  . Bipolar  disorder ([place]) 05/12/2015  . Type 2 diabetes mellitus without complications ([place]) 54/65/0354  . Chronic migraine without aura 09/03/2012  . 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 Syncope and collapse 03/17/2012  . Depression 03/17/2012  . Migraine 03/17/2012   [name], MSOT, OTR/L Behavioral Health OT/ Acute Relief OT PHP Office: [phone]  [name] 06/08/2019, [address] [place], [place], [address] Phone: [phone]   Fax:  [phone]  Name: LEIYAH MAULTSBY MRN: [id number] Date of Birth: January 06, 1966

## 2019-06-09 ENCOUNTER — Other Ambulatory Visit: Payer: Self-pay

## 2019-06-09 ENCOUNTER — Other Ambulatory Visit (HOSPITAL_COMMUNITY): Payer: Medicare Other | Admitting: Licensed Clinical Social Worker

## 2019-06-09 ENCOUNTER — Other Ambulatory Visit (HOSPITAL_COMMUNITY): Payer: Medicare Other | Admitting: Occupational Therapy

## 2019-06-09 ENCOUNTER — Encounter (HOSPITAL_COMMUNITY): Payer: Self-pay | Admitting: Occupational Therapy

## 2019-06-09 DIAGNOSIS — R4589 Other symptoms and signs involving emotional state: Secondary | ICD-10-CM

## 2019-06-09 DIAGNOSIS — F315 Bipolar disorder, current episode depressed, severe, with psychotic features: Secondary | ICD-10-CM | POA: Diagnosis not present

## 2019-06-09 NOTE — Therapy (Signed)
[facility] [address] [place], [place], [address] Phone: [phone]   Fax:  [phone]  Occupational Therapy Treatment  Patient Details  Name: Jennifer Bright MRN: [id number] Date of Birth: 08/15/1966 Referring Provider (OT): Hillery Jacksanika Lewis, NP  Virtual Visit via Video Note  I connected with Jennifer Bright on 06/09/19 at  8:00 AM EDT by a video enabled telemedicine application and verified that I am speaking with the correct person using two identifiers.   I discussed the limitations of evaluation and management by telemedicine and the availability of in person appointments. The patient expressed understanding and agreed to proceed.   I discussed the assessment and treatment plan with the patient. The patient was provided an opportunity to ask questions and all were answered. The patient agreed with the plan and demonstrated an understanding of the instructions.   The patient was advised to call back or seek an in-person evaluation if the symptoms worsen or if the condition fails to improve as anticipated.  I provided 60 minutes of non-face-to-face time during this encounter.   Dalphine HandingKaylee Kiarah Eckstein, OT    Encounter Date: 06/09/2019  OT End of Session - 06/09/19 1625    Visit Number  8    Number of Visits  12    Date for OT Re-Evaluation  06/25/19    Authorization Type  [employer]    OT Start Time  1100    OT Stop Time  1200    OT Time Calculation (min)  60 min    Activity Tolerance  Patient tolerated treatment well    Behavior During Therapy  WFL for tasks assessed/performed       Past Medical History:  Diagnosis Date  . Acid reflux   . Anemia   . Anxiety   . Bipolar disorder (HCC)   . Chronic kidney disease    STAGE 3  . Depression   . Diabetes mellitus    NIDD  . Essential tremor   . Family history of ovarian cancer    Pt doesn't meet Medicare genetic testing guidelines  . Hyperlipidemia   . Hypothyroid    . IBS (irritable bowel syndrome)   . Migraine headache    vestibular migraine  . Obesity   . Osteopenia   . Restless leg syndrome   . Sarcoidosis   . Sleep apnea   . Syncope \    Past Surgical History:  Procedure Laterality Date  . ABDOMINAL HYSTERECTOMY    . APPENDECTOMY    . BLADDER SURGERY     bladder tact  . CATARACT EXTRACTION W/PHACO Right 09/21/2018   Procedure: CATARACT EXTRACTION PHACO AND INTRAOCULAR LENS PLACEMENT (IOC);  Surgeon: [place] CraneKing, Bradley Mark, MD;  Location: [facility] ORS;  Service: Ophthalmology;  Laterality: Right;  US  00:20 CDE 00.82 Fluid pack lot # [id number]  . CATARACT EXTRACTION W/PHACO Left 10/17/2018   Procedure: CATARACT EXTRACTION PHACO AND INTRAOCULAR LENS PLACEMENT (IOC)  LEFT;  Surgeon: Nevada CraneKing, Bradley Mark, MD;  [facility];  Service: Ophthalmology;  Laterality: Left;  diabetic - diet controlled  . CESAREAN SECTION    . CHOLECYSTECTOMY    . COLONOSCOPY WITH PROPOFOL N/A 04/05/2017   Procedure: COLONOSCOPY WITH PROPOFOL;  Surgeon: Christena DeemSkulskie, Martin U, MD;  Location[facility];  Service: Endoscopy;  Laterality: N/A;  . ESOPHAGOGASTRODUODENOSCOPY (EGD) WITH PROPOFOL N/A 08/28/2016   Procedure: ESOPHAGOGASTRODUODENOSCOPY (EGD) WITH PROPOFOL;  Surgeon: [name], MD;  [facility];  Service: Endoscopy;  Laterality: N/A;  . HERNIA REPAIR    . INGUINAL HERNIA REPAIR     rt and left  . lexiscan cardiolite    . TILT TABLE STUDY N/A 04/01/2012   Procedure: TILT TABLE STUDY;  Surgeon: Deboraha Sprang, MD;  Location: [facility] CATH LAB;  Service: Cardiovascular;  Laterality: N/A;    There were no vitals filed for this visit.  Subjective Assessment - 06/09/19 1625    Currently in Pain?  No/denies          S: I use a notepad to make a large non time sensitive list   O: Education given on routine management and its importance in increasing functional BADL/IADL independence. Education given on how to build daily routines,  with various tips of organization and time management included. Home maintaining, meal preparation, child/pet care, work life balance, and medication management all discussed. Pt asked to share personal experiences and one new skill they would like to implement form session.    A: Pt presents to group with brighter affect this date, laughing and smiling on occasion. Pt shares how she currently makes a list of things to get done that is not time sensitive to help structure her routine. She shares how her sense of routine decreases when she is depressed. She shares that she will continue this list making and making her bed each morning to prevent her from oversleeping throughout the day.  P: OT Group will be x3 per week while pt in [place]             OT Education - 06/09/19 1625    Education Details  education given on BADL/IADL routine formation    Person(s) Educated  Patient    Methods  Explanation;Handout    Comprehension  Verbalized understanding       OT Short Term Goals - 05/31/19 1047      OT SHORT TERM GOAL #1   Title  Pt will be education on strategies to improve psychosocial skills needed to participate in all daily, work, and leisure activiteis    Time  4    Period  Weeks    Status  On-going    Target Date  06/25/19      OT SHORT TERM GOAL #2   Title  Pt will apply psychosocial skills and coping mechanisms to daily activities in order to function independently and reintegrate into community    Time  4    Period  Weeks    Status  On-going      OT SHORT TERM GOAL #3   Title  Pt will recall and apply 1-3 sleep hygiene strategies to improve BADL function prior to reintegrating into community    Time  4    Period  Weeks    Status  On-going      OT SHORT TERM GOAL #4   Title  Pt will recall 1-3 social participation opportunities to decrease isolation prior to reintegrating into community    Time  4    Period  Weeks    Status  On-going      OT SHORT TERM GOAL #5    Title  Pt will recall and/or apply 1-3 assertiveness skills to increase pro social contact prior to reintegrating into community    Time  4    Period  Weeks    Status  On-going               Plan - 06/09/19 1626    Occupational  performance deficits (Please refer to evaluation for details):  ADL's;IADL's;Rest and Sleep;Work;Social Participation;Leisure    Body Structure / Function / Physical Skills  ADL;IADL    Cognitive Skills  Attention;Emotional;Temperament/Personality;Thought    Psychosocial Skills  Coping Strategies;Interpersonal Interaction;Routines and Behaviors;Habits       Patient will benefit from skilled therapeutic intervention in order to improve the following deficits and impairments:   Body Structure / Function / Physical Skills: ADL, IADL Cognitive Skills: Attention, Emotional, Temperament/Personality, Thought Psychosocial Skills: Coping Strategies, Interpersonal Interaction, Routines and Behaviors, Habits   Visit Diagnosis: Bipolar disorder, current episode depressed, severe, with psychotic features (HCC)  Difficulty coping    Problem List Patient Active Problem List   Diagnosis Date Noted  . Vaginal atrophy 07/12/2017  . Polypharmacy 08/14/2016  . 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 Hypercalcemia 07/24/2016  . Sarcoidosis 07/24/2016  . Hyperlipidemia, unspecified 11/15/2015  . Type 2 diabetes mellitus without complication, without long-term current use of insulin (HCC) 11/15/2015  . Anxiety disorder 07/29/2015  . Anxiety 05/12/2015  . Bipolar depression (HCC) 05/12/2015  . Essential (primary) hypertension 05/12/2015  . Benign essential tremor 05/12/2015  . Gastro-esophageal reflux disease without esophagitis 05/12/2015  . Cephalalgia 05/12/2015  . Hypersomnia with sleep apnea 05/12/2015  . Restless leg 05/12/2015  . Diabetes (HCC) 05/12/2015  . Hypothyroidism 05/12/2015  . Migraine variant 05/12/2015  . Bipolar disorder (HCC) 05/12/2015   . Type 2 diabetes mellitus without complications (HCC) 05/12/2015  . Chronic migraine without aura 09/03/2012  . 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 Syncope and collapse 03/17/2012  . Depression 03/17/2012  . Migraine 03/17/2012   Dalphine HandingKaylee Jaxtyn Linville, MSOT, OTR/L Behavioral Health OT/ Acute Relief OT PHP Office: [phone]  Dalphine HandingKaylee Daine Croker 06/09/2019, 4:26 PM  [facility] H[facility]PITAL[facility] PROGRAM [address] [place], [place], [address] Phone: [phone]   Fax:  [phone]  Name: Jennifer Bright MRN: [id number] Date of Birth: 04/30/1966

## 2019-06-12 ENCOUNTER — Other Ambulatory Visit: Payer: Self-pay

## 2019-06-12 ENCOUNTER — Other Ambulatory Visit (HOSPITAL_COMMUNITY): Payer: Medicare Other | Admitting: Licensed Clinical Social Worker

## 2019-06-12 DIAGNOSIS — F315 Bipolar disorder, current episode depressed, severe, with psychotic features: Secondary | ICD-10-CM | POA: Diagnosis not present

## 2019-06-12 NOTE — Psych (Signed)
Virtual Visit via Video Note  I connected with Jennifer Bright on 05/31/19 at  9:00 AM EDT by a video enabled telemedicine application and verified that I am speaking with the correct person using two identifiers.   I discussed the limitations of evaluation and management by telemedicine and the availability of in person appointments. The patient expressed understanding and agreed to proceed.   I discussed the assessment and treatment plan with the patient. The patient was provided an opportunity to ask questions and all were answered. The patient agreed with the plan and demonstrated an understanding of the instructions.   The patient was advised to call back or seek an in-person evaluation if the symptoms worsen or if the condition fails to improve as anticipated.  Pt was provided 240 minutes of non-face-to-face time during this encounter.   Donia GuilesJenny Hildegarde Dunaway, LCSW    [facility] PHP THERAPIST PROGRESS NOTE  Jennifer Bright [id number]  Session Time: 9:00 - 10:00  Participation Level: Minimal  Behavioral Response: CasualAlertDepressed  Type of Therapy: Group Therapy  Treatment Goals addressed: Coping  Interventions: CBT, DBT, Supportive and Reframing  Summary: Clinician led check-in regarding current stressors and situation, and review of patient completed daily inventory. Clinician utilized active listening and empathetic response and validated patient emotions. Clinician facilitated processing group on pertinent issues.   Therapist Response: Jennifer Bright is a 53 y.o. female who presents with depression and anxiety symptoms. Patient arrived within time allowed and reports that she is feeling "bad." Patient states she is unable to rate mood on a scale of 1-10 with 10 being great because of how badly she feels. Pt is tearful and makes poor eye contact. Pt declines to share anything and denies SI/HI.       Session Time: 10:00-11:00  Participation  Level:None  Behavioral Response:CasualAlertDepressed  Type of Therapy: Group Therapy, psychoeducation, psychotherapy  Treatment Goals addressed: Coping  Interventions:CBT, DBT, Solution Focused, Supportive and Reframing  Summary:Cln led discussion on ways reality differs from the "social story" and how it creates conflict in our lives. Group shared ways their expectations have been holding them back due to these fallacies and how to begin to shift them in a healthier direction.   Therapist Response: Patient did not participate in discussion.       Session Time: 11:00 -12:15  Participation Level: Active  Behavioral Response: CasualAlertDepressed  Type of Therapy: Group Therapy, psychotherapy  Treatment Goals addressed: Coping  Interventions: Strengths based, reframing, Supportive,   Summary:  Spiritual Care group  Therapist Response: Patient engaged in group. See chaplain note.        Session Time: 12:00 -1:00  Participation Level: Minimal  Behavioral Response:CasualAlertDepressed  Type of Therapy: Group Therapy, Psychoeducation; Psychotherapy  Treatment Goals addressed: Coping  Interventions:CBT; DBT;Solution focused; Supportive; Reframing  Summary:12:00 - 12:50:Clinician continued topic of cognitive distortions. Cln led review of previously discussed "catch" information. Group moved to "challenge" portion of behavior modification model and utilized handout "Socratic Questions" to challenge an identified distorted thought.  12:50 -1:00 Clinician led check-out. Clinician assessed for immediate needs, medication compliance and efficacy, and safety concerns   Therapist Response:12:00 - 12:50:Pt did not participate in discussion 12:50 - 1:00: At check-out, patient rates hermood at a 3on a scale of 1-10 with 10 being great. Patient reportsno plans for her afternoon. Pt continues to appear depressed and tearful.Pt declines  individual support or to share at end of session. Patient demonstrates limited progress as evidenced by no participation. Patient denies  SI/HI/self-harm at the end of group.    Suicidal/Homicidal: Nowithout intent/plan  Plan: Pt will continue in PHP while working to stabilize mood and increase ability to self-manage her symptoms as they arise.   Diagnosis: Bipolar disorder, current episode depressed, severe, with psychotic features ([place]) [F31.5]    1. Bipolar disorder, current episode depressed, severe, with psychotic features ([place])       Lorin Glass, LCSW 06/12/2019

## 2019-06-12 NOTE — Psych (Signed)
Virtual Visit via Video Note  I connected with Jennifer Bright on 05/30/19 at  9:00 AM EDT by a video enabled telemedicine application and verified that I am speaking with the correct person using two identifiers.   I discussed the limitations of evaluation and management by telemedicine and the availability of in person appointments. The patient expressed understanding and agreed to proceed.   I discussed the assessment and treatment plan with the patient. The patient was provided an opportunity to ask questions and all were answered. The patient agreed with the plan and demonstrated an understanding of the instructions.   The patient was advised to call back or seek an in-person evaluation if the symptoms worsen or if the condition fails to improve as anticipated.  Pt was provided 240 minutes of non-face-to-face time during this encounter.   Lorin Glass, LCSW    [facility] PHP THERAPIST PROGRESS NOTE  Jennifer Bright [id number]  Session Time: 9:00 - 10:00  Participation Level: Active  Behavioral Response: CasualAlertDepressed  Type of Therapy: Group Therapy  Treatment Goals addressed: Coping  Interventions: CBT, DBT, Supportive and Reframing  Summary: Clinician led check-in regarding current stressors and situation, and review of patient completed daily inventory. Clinician utilized active listening and empathetic response and validated patient emotions. Clinician facilitated processing group on pertinent issues.   Therapist Response: Jennifer Bright is a 53 y.o. female who presents with depression and anxiety symptoms. Patient arrived within time allowed and reports that she is feeling "pretty good." Patient rates her mood at a 5.5 on a scale of 1-10 with 10 being great. Pt reports she has been up since 1:30 AM and has been going "full blast" and is starting to feel herself crash. Pt states she spent the morning organizing and sorting her jewelry and cleaning. Pt states  that yesterday she napped and completed the 5 love languages with her husband. Pt struggles with frustrations regarding her physical limitations. Pt able to process. Pt engaged in discussion.      Session Time: 10:00 -11:00  Participation Level: Active  Behavioral Response: CasualAlertDepressed  Type of Therapy: Group Therapy, psychoeducation, psychotherapy  Treatment Goals addressed: Coping  Interventions: CBT, DBT, Solution Focused, Supportive and Reframing  Summary:  Cln led discussion on the "emotion volcano" and how stress builds until an explosion point if not dealt with. Group members shared how they typically respond to stress. Group members brainstormed ways they could incorporate stress management strategies into their lives to decrease frequency of "explosions."   Therapist Response: Patient participated in discussion and reports she stuff her stress or "melt down." Pt denies having any stress management skills.       Session Time: 11:00- 12:00  Participation Level: Active  Behavioral Response: CasualAlertDepressed  Type of Therapy: Group Therapy, Psychoeducation; Psychotherapy  Treatment Goals addressed: Coping  Interventions: CBT; Solution focused; Supportive; Reframing  Summary: Clinician continued topic of cognitive distortions. Cln continued to review of the types of cognitive distortions, working on "catch" and utilizing "Unhealthy Thought Patterns" handout. Group members determined examples of how the distortions play out and effect their lives.     Therapist Response: Pt participated in discussion and reports struggling with distortion "fallacy of fairness."       Session Time: 12:00 -1:00  Participation Level:Active  Behavioral Response:CasualAlertDepressed  Type of Therapy: Group Therapy, OT  Treatment Goals addressed: Coping  Interventions:Psychosocial skills training, Supportive,   Summary:12:00 -  12:50:Occupational Therapy group 12:50 -1:00 Clinician led check-out. Clinician assessed  for immediate needs, medication compliance and efficacy, and safety concerns   Therapist Response: 12:00 - 12:50: Patient engaged in group. See OT note.  12:50 - 1:00: At check-out, patient rates her mood at a 5 on a scale of 1-10 with 10 being great. Patient reports afternoon plans of spending time at home. Patient demonstrates some progress as evidenced by reporting she did not become upset yesterday. Patient denies SI/HI/self-harm at the end of group.    Suicidal/Homicidal: Nowithout intent/plan  Plan: Pt will continue in PHP while working to stabilize mood and increase ability to self-manage her symptoms as they arise.   Diagnosis: Bipolar disorder, current episode depressed, severe, with psychotic features (HCC) [F31.5]    1. Bipolar disorder, current episode depressed, severe, with psychotic features (HCC)       Donia GuilesJenny Naquan Garman, LCSW 06/12/2019

## 2019-06-12 NOTE — Psych (Signed)
Virtual Visit via Video Note  I connected with Jennifer Bright on 05/29/19 at  9:00 AM EDT by a video enabled telemedicine application and verified that I am speaking with the correct person using two identifiers.   I discussed the limitations of evaluation and management by telemedicine and the availability of in person appointments. The patient expressed understanding and agreed to proceed.   I discussed the assessment and treatment plan with the patient. The patient was provided an opportunity to ask questions and all were answered. The patient agreed with the plan and demonstrated an understanding of the instructions.   The patient was advised to call back or seek an in-person evaluation if the symptoms worsen or if the condition fails to improve as anticipated.  Pt was provided 240 minutes of non-face-to-face time during this encounter.   Jennifer Glass, Jennifer Bright    [facility] PHP THERAPIST PROGRESS NOTE  Jennifer Bright [id number]  Session Time: 9:00 - 10:00  Participation Level: Active  Behavioral Response: CasualAlertDepressed  Type of Therapy: Group Therapy  Treatment Goals addressed: Coping  Interventions: CBT, DBT, Supportive and Reframing  Summary: Clinician led check-in regarding current stressors and situation, and review of patient completed daily inventory. Clinician utilized active listening and empathetic response and validated patient emotions. Clinician facilitated processing group on pertinent issues.   Therapist Response: Jennifer Bright is a 53 y.o. female who presents with depression and anxiety symptoms. Patient arrived within time allowed and reports that she is feeling "not good." Patient rates her mood at a 4.5 on a scale of 1-10 with 10 being great. Pt reports she "didn't have a good weekend" and had frequent "melt downs." Pt states she had "dark" thoughts on Friday and called a crisis line on Saturday morning. Pt reports that she was hung up on after  18 minutes and it made her "worse." Pt took this as a sign that no one will help her. Pt shares she went to her grandson's birthday party because she did not want to "make waves" in the family and it was a "disaster." Pt is tearful. Pt struggles with insight and willingness to apply new habits. Pt able to process. Pt engaged in discussion.      Session Time: 10:00-11:00  Participation Level:Active  Behavioral Response:CasualAlertDepressed  Type of Therapy: Group Therapy, psychoeducation, psychotherapy  Treatment Goals addressed: Coping  Interventions:CBT, DBT, Solution Focused, Supportive and Reframing  Summary:Cln led discussion on distraction as a coping strategy. Cln educated on the reasons why distraction can be beneficial and how to use it successfully. Group discussed distractions they have used in the past and have/can implement currently.  Therapist Response: Patient participated in discussion and states nothing works "when I get like that." Pt accepts reframe and is able to identify cleaning is helpful to her.       Session Time: 11:00- 12:00  Participation Level:Active  Behavioral Response:CasualAlertAnxious  Type of Therapy: Group Therapy, Psychoeducation; Psychotherapy  Treatment Goals addressed: Coping  Interventions:CBT;DBT;Solution focused; Supportive; Reframing  Summary:Clinician led group on The Five Love Languages and how they can aid relationships. Group members discussed the importance of each language and what they look like. Cln discussed how looking at love languages can counteract unhealthy thought patterns and improve interpersonal relationships. Group members were given link to take the quiz should they desire.  Therapist Response: Pt participated in discussion andstates she would like her family to know her love language so they will treat her better. Pt reports she  will take the quiz with her husband.        Session Time: 12:00 -1:00  Participation Level:Active  Behavioral Response:CasualAlertAnxious  Type of Therapy: Group Therapy, Psychoeducation; Psychotherapy  Treatment Goals addressed: Coping  Interventions:CBT; DBT;Solution focused; Supportive; Reframing  Summary:12:00 - 12:50:Clinician continued topic of cognitive distortions. Cln began review of the types of cognitive distortions utilizing "Cognitive Distortions"handout and group members determined examples of how the distortions play out and effect their lives.  12:50 -1:00 Clinician led check-out. Clinician assessed for immediate needs, medication compliance and efficacy, and safety concerns   Therapist Response:12:00 - 12:50:Patient engaged in discussion and identifies struggling with personalization and mind reading. 12:50 - 1:00: At check-out, patient rates hermood at a 4on a scale of 1-10 with 10 being great. Patient reportsafternoon plans of looking up activities on the internet.Patient demonstrates some progress as evidenced by coming to group when she didn't want to. Patient denies SI/HI/self-harm at the end of group, however expresses concern over the evening. Pt given and wrote down crisis numbers.    Suicidal/Homicidal: Nowithout intent/plan  Plan: Pt will continue in PHP while working to stabilize mood and increase ability to self-manage her symptoms as they arise.   Diagnosis: Bipolar disorder, current episode depressed, severe, with psychotic features (HCC) [F31.5]    1. Bipolar disorder, current episode depressed, severe, with psychotic features (HCC)       Jennifer GuilesJenny Merlen Gurry, Jennifer Bright 06/12/2019

## 2019-06-13 ENCOUNTER — Other Ambulatory Visit: Payer: Self-pay

## 2019-06-13 ENCOUNTER — Other Ambulatory Visit (HOSPITAL_COMMUNITY): Payer: Medicare Other | Admitting: Licensed Clinical Social Worker

## 2019-06-13 ENCOUNTER — Encounter (HOSPITAL_COMMUNITY): Payer: Self-pay | Admitting: Occupational Therapy

## 2019-06-13 ENCOUNTER — Other Ambulatory Visit (HOSPITAL_COMMUNITY): Payer: Medicare Other | Admitting: Occupational Therapy

## 2019-06-13 DIAGNOSIS — R4589 Other symptoms and signs involving emotional state: Secondary | ICD-10-CM

## 2019-06-13 DIAGNOSIS — F315 Bipolar disorder, current episode depressed, severe, with psychotic features: Secondary | ICD-10-CM | POA: Diagnosis not present

## 2019-06-13 NOTE — Progress Notes (Signed)
Spoke with patient via telephone due to limited knowledge of computer for video call. States that groups are going well and she is learning how to set boundaries with her family. Denies SI/HI but admits to having fleeting thoughts of wanting to die. Has not had any plans. States that [place] in group has given her ways to help the thoughts stay away. Hears voices but not command. Geodon works but some days are worse than others. Last night she only slept 30 mins due to voices being so bad. She goes and listens to music with her headphones to keep them away. Sometimes she hears footsteps in the house and that helps a lot. She is being discharged from [facility] tomorrow and is planning to start IOP.On scale 1-10 as 10 being worst she rates depression at 5/6 and anxiety at 8. PHQ9=13.

## 2019-06-13 NOTE — Therapy (Signed)
[facility] [place], [place], [address] Phone: [phone]   Fax:  [phone]  Occupational Therapy Treatment  Patient Details  Name: JOYCLYN PLAZOLA MRN: [id number] Date of Birth: 04/11/1966 Referring Provider (OT): [name], NP  Virtual Visit via Video Note  I connected with STEFANIE HODGENS on 06/13/19 at  8:00 AM EDT by a video enabled telemedicine application and verified that I am speaking with the correct person using two identifiers.   I discussed the limitations of evaluation and management by telemedicine and the availability of in person appointments. The patient expressed understanding and agreed to proceed.   I discussed the assessment and treatment plan with the patient. The patient was provided an opportunity to ask questions and all were answered. The patient agreed with the plan and demonstrated an understanding of the instructions.   The patient was advised to call back or seek an in-person evaluation if the symptoms worsen or if the condition fails to improve as anticipated.  I provided 60 minutes of non-face-to-face time during this encounter.   [name], OT    Encounter Date: 06/13/2019  OT End of Session - 06/13/19 1654    Visit Number  9    Number of Visits  12    Date for OT Re-Evaluation  06/25/19    Authorization Type  [employer]    OT Start Time  1100    OT Stop Time  1200    OT Time Calculation (min)  60 min    Activity Tolerance  Patient tolerated treatment well    Behavior During Therapy  WFL for tasks assessed/performed       Past Medical History:  Diagnosis Date  . Acid reflux   . Anemia   . Anxiety   . Bipolar disorder ([place])   . Chronic kidney disease    STAGE 3  . Depression   . Diabetes mellitus    NIDD  . Essential tremor   . Family history of ovarian cancer    Pt doesn't meet Medicare genetic testing guidelines  . Hyperlipidemia   . Hypothyroid    . IBS (irritable bowel syndrome)   . Migraine headache    vestibular migraine  . Obesity   . Osteopenia   . Restless leg syndrome   . Sarcoidosis   . Sleep apnea   . Syncope \    Past Surgical History:  Procedure Laterality Date  . ABDOMINAL HYSTERECTOMY    . APPENDECTOMY    . BLADDER SURGERY     bladder tact  . CATARACT EXTRACTION W/PHACO Right 09/21/2018   Procedure: CATARACT EXTRACTION PHACO AND INTRAOCULAR LENS PLACEMENT ([place]);  Surgeon: [name], MD;  Location: [facility] ORS;  Service: Ophthalmology;  Laterality: Right;  [place]  00:20 CDE 00.82 Fluid pack lot # [id number]  . CATARACT EXTRACTION W/PHACO Left 10/17/2018   Procedure: CATARACT EXTRACTION PHACO AND INTRAOCULAR LENS PLACEMENT ([place])  LEFT;  Surgeon: [name], MD;  Location: [facility];  Service: Ophthalmology;  Laterality: Left;  diabetic - diet controlled  . CESAREAN SECTION    . CHOLECYSTECTOMY    . COLONOSCOPY WITH PROPOFOL N/A 04/05/2017   Procedure: COLONOSCOPY WITH PROPOFOL;  Surgeon: [name], MD;  Location: [facility] ENDOSCOPY;  Service: Endoscopy;  Laterality: N/A;  . ESOPHAGOGASTRODUODENOSCOPY (EGD) WITH PROPOFOL N/A 08/28/2016   Procedure: ESOPHAGOGASTRODUODENOSCOPY (EGD) WITH PROPOFOL;  Surgeon: [name], MD;  Location: [facility] ENDOSCOPY;  Service: Endoscopy;  Laterality: N/A;  . HERNIA REPAIR    . INGUINAL HERNIA REPAIR     rt and left  . lexiscan cardiolite    . TILT TABLE STUDY N/A 04/01/2012   Procedure: TILT TABLE STUDY;  Surgeon: Deboraha Sprang, MD;  Location: [facility] CATH LAB;  Service: Cardiovascular;  Laterality: N/A;    There were no vitals filed for this visit.  Subjective Assessment - 06/13/19 1653    Currently in Pain?  No/denies       S: I have hard time planning because I do not know if I will be here and do not want to be a burden ( MSW informed of statement for safety)  O: Education given on self-accountability being in line with personal values and goals  to maintain occupational balance in various community settings. Pt given goal identifying worksheet to list immediate, short term, medium term, and long-term goals using a SMART goal framework (specificity, meaningful, adaptive, realistic, and time bound). Goals created as guideline for pt to practice being accountable in various situations. Pt completed work sheet of goals and encouraged to share goals with the group, with emphasis on immediate goal for check in with pt for next session to maintain accountability.    A: Pt presents with blunted affect, engaged and participatory. She shares she would like to focus on personal health/growth. Pt created smart goals for up to 2 weeks time with moderate VC's due to difficulty with forward thinking. Continued to provide support and education on importance of future planning.  P: Anticipate d/c this date.                 OT Education - 06/13/19 1654    Education Details  education given on SMART goals    Person(s) Educated  Patient    Methods  Explanation;Handout    Comprehension  Verbalized understanding       OT Short Term Goals - 06/13/19 1654      OT SHORT TERM GOAL #1   Title  Pt will be education on strategies to improve psychosocial skills needed to participate in all daily, work, and leisure activiteis    Time  4    Period  Weeks    Status  Achieved    Target Date  06/25/19      OT SHORT TERM GOAL #2   Title  Pt will apply psychosocial skills and coping mechanisms to daily activities in order to function independently and reintegrate into community    Time  4    Period  Weeks    Status  Achieved      OT SHORT TERM GOAL #3   Title  Pt will recall and apply 1-3 sleep hygiene strategies to improve BADL function prior to reintegrating into community    Time  4    Period  Weeks    Status  Achieved      OT SHORT TERM GOAL #4   Title  Pt will recall 1-3 social participation opportunities to decrease isolation prior to  reintegrating into community    Time  4    Period  Weeks    Status  Partially Met      OT SHORT TERM GOAL #5   Title  Pt will recall and/or apply 1-3 assertiveness skills to increase pro social contact prior to reintegrating into community    Time  4    Period  Weeks    Status  Achieved  Plan - 06/13/19 1654    Occupational performance deficits (Please refer to evaluation for details):  ADL's;IADL's;Rest and Sleep;Work;Social Participation;Leisure    Body Structure / Function / Physical Skills  ADL;IADL    Cognitive Skills  Attention;Emotional;Temperament/Personality;Thought    Psychosocial Skills  Coping Strategies;Interpersonal Interaction;Routines and Behaviors;Habits       Patient will benefit from skilled therapeutic intervention in order to improve the following deficits and impairments:   Body Structure / Function / Physical Skills: ADL, IADL Cognitive Skills: Attention, Emotional, Temperament/Personality, Thought Psychosocial Skills: Coping Strategies, Interpersonal Interaction, Routines and Behaviors, Habits   Visit Diagnosis: Bipolar disorder, current episode depressed, severe, with psychotic features ([place])  Difficulty coping    Problem List Patient Active Problem List   Diagnosis Date Noted  . Vaginal atrophy 07/12/2017  . Polypharmacy 08/14/2016  . Sarcoidosis of lung ([place]) 08/14/2016  . Hypercalcemia 07/24/2016  . Sarcoidosis 07/24/2016  . Hyperlipidemia, unspecified 11/15/2015  . Type 2 diabetes mellitus without complication, without long-term current use of insulin ([place]) 11/15/2015  . Anxiety disorder 07/29/2015  . Anxiety 05/12/2015  . Bipolar depression ([place]) 05/12/2015  . Essential (primary) hypertension 05/12/2015  . Benign essential tremor 05/12/2015  . Gastro-esophageal reflux disease without esophagitis 05/12/2015  . Cephalalgia 05/12/2015  . Hypersomnia with sleep apnea 05/12/2015  . Restless leg 05/12/2015  . Diabetes  ([place]) 05/12/2015  . Hypothyroidism 05/12/2015  . Migraine variant 05/12/2015  . Bipolar disorder ([place]) 05/12/2015  . Type 2 diabetes mellitus without complications ([place]) 38/88/7579  . Chronic migraine without aura 09/03/2012  . 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 Syncope and collapse 03/17/2012  . Depression 03/17/2012  . Migraine 03/17/2012   OCCUPATIONAL THERAPY DISCHARGE SUMMARY  Visits from Start of Care: 9  Current functional level related to goals / functional outcomes: Stepping down to IOP level of care   Remaining deficits: Continue to implement skills learned   Education / Equipment: Education given on psychosocial and coping mechanisms as it applies to functional participation in BADL/IADL routine in preparation for reintegration into community Plan: Patient agrees to discharge.  Patient goals were partially met. Patient is being discharged due to being pleased with the current functional level.  ?????       [name], MSOT, OTR/L Behavioral Health OT/ Acute Relief OT PHP Office: [place] 06/13/2019, 4:55 PM  [facility] HOSPITALIZATION PROGRAM [place], [place], [address] Phone: [phone]   Fax:  [phone]  Name: RONNE SAVOIA MRN: [id number] Date of Birth: [birth year]

## 2019-06-14 ENCOUNTER — Encounter (HOSPITAL_COMMUNITY): Payer: Self-pay | Admitting: Family

## 2019-06-14 ENCOUNTER — Other Ambulatory Visit (HOSPITAL_COMMUNITY): Payer: Medicare Other | Admitting: Licensed Clinical Social Worker

## 2019-06-14 ENCOUNTER — Other Ambulatory Visit: Payer: Self-pay

## 2019-06-14 ENCOUNTER — Telehealth (HOSPITAL_COMMUNITY): Payer: Self-pay | Admitting: Psychiatry

## 2019-06-14 DIAGNOSIS — F315 Bipolar disorder, current episode depressed, severe, with psychotic features: Secondary | ICD-10-CM

## 2019-06-14 NOTE — Psych (Signed)
Virtual Visit via Video Note  I connected with Jennifer Bright on 06/02/19 at  9:00 AM EDT by a video enabled telemedicine application and verified that I am speaking with the correct person using two identifiers.   I discussed the limitations of evaluation and management by telemedicine and the availability of in person appointments. The patient expressed understanding and agreed to proceed.   I discussed the assessment and treatment plan with the patient. The patient was provided an opportunity to ask questions and all were answered. The patient agreed with the plan and demonstrated an understanding of the instructions.   The patient was advised to call back or seek an in-person evaluation if the symptoms worsen or if the condition fails to improve as anticipated.  Pt was provided 240 minutes of non-face-to-face time during this encounter.   Lorin Glass, LCSW    [facility] [place] PHP THERAPIST PROGRESS NOTE  Jennifer Bright [id number]  Session Time: 9:00 - 10:00  Participation Level: Active  Behavioral Response: CasualAlertDepressed  Type of Therapy: Group Therapy  Treatment Goals addressed: Coping  Interventions: CBT, DBT, Supportive and Reframing  Summary: Clinician led check-in regarding current stressors and situation, and review of patient completed daily inventory. Clinician utilized active listening and empathetic response and validated patient emotions. Clinician facilitated processing group on pertinent issues.   Therapist Response: Jennifer Bright is a 53 y.o. female who presents with depression and anxiety symptoms. Patient arrived within time allowed and reports that she is feeling "okay." Patient rates her mood at a 5 on a scale of 1-10 with 10 being great. Pt reports she has been awake since 3:30 AM however slept 5 hours which is more than normal for her. Pt states she spent her morning cleaning and doing laundry. Pt shares her afternoon was "very bad" and that the  group session did not sit well with her yesterday and won't elaborate as to why. Pt reports she passed out later in the evening and doesn't remember much of the evening. Pt reports struggling with anxiety about a dental appointment today. Pt able to process. Pt engaged in discussion.       Session Time: 10:00 -11:00  Participation Level: Active  Behavioral Response: CasualAlertDepressed  Type of Therapy: Group Therapy, psychoeducation, psychotherapy  Treatment Goals addressed: Coping  Interventions: CBT, DBT, Solution Focused, Supportive and Reframing  Summary: Cln led discussion on radical acceptance. Cln educated on what radical acceptance is and how to apply it. Group members shared ways in which radical acceptance could be helpful and struggles they have with utilizing it.  Therapist Response: Patient participated in discussion and shares this concept would be "very helpful" however is skeptical that she can apply it. Pt struggles to stay on topic and gets derailed by talking about the mistreatment she receives from her family rather than a way to manage it.        Session Time: 11:00- 12:00  Participation Level: Active  Behavioral Response: CasualAlertDepressed  Type of Therapy: Group Therapy, Psychoeducation; Psychotherapy  Treatment Goals addressed: Coping  Interventions: CBT; Solution focused; Supportive; Reframing  Summary: Clinician continued topic of distress tolerance skills and introduced TIPP skills. Group members discussed ways they could utilize the skills in their everyday life.       Therapist Response: Pt reports understanding of skill and states that she is likely to use the temperature skill.       Session Time: 12:00 -1:00  Participation Level:Active  Behavioral Response:CasualAlertDepressed  Type of  Therapy: Group Therapy, OT  Treatment Goals addressed: Coping  Interventions:Psychosocial skills training,  Supportive,   Summary:12:00 - 12:50:Occupational Therapy group 12:50 -1:00 Clinician led check-out. Clinician assessed for immediate needs, medication compliance and efficacy, and safety concerns   Therapist Response: 12:00 - 12:50: Patient engaged in group. See OT note.  12:50 - 1:00: At check-out, patient rates her mood at a 4 on a scale of 1-10 with 10 being great. Pt states afternoon plans of going to the dentist. Patient demonstrates some progress as evidenced by managing negative mood. Patient denies SI/HI/self-harm at the end of group.    Suicidal/Homicidal: Nowithout intent/plan  Plan: Pt will continue in PHP while working to stabilize mood and increase ability to self-manage her symptoms as they arise.   Diagnosis: Bipolar disorder, current episode depressed, severe, with psychotic features ([facility]) [F31.5]    1. Bipolar disorder, current episode depressed, severe, with psychotic features ([facility])       Donia GuilesJenny Ramina Hulet, LCSW 06/14/2019

## 2019-06-14 NOTE — Progress Notes (Signed)
   Virtual Visit via Telephone Note  I connected with Jennifer Bright on 06/14/19 at  9:00 AM EDT by telephone and verified that I am speaking with the correct person using two identifiers.   I discussed the limitations, risks, security and privacy concerns of performing an evaluation and management service by telephone and the availability of in person appointments. I also discussed with the patient that there may be a patient responsible charge related to this service. The patient expressed understanding and agreed to proceed.    I discussed the assessment and treatment plan with the patient. The patient was provided an opportunity to ask questions and all were answered. The patient agreed with the plan and demonstrated an understanding of the instructions.   The patient was advised to call back or seek an in-person evaluation if the symptoms worsen or if the condition fails to improve as anticipated.  I provided 15  minutes of non-face-to-face time during this encounter.   Derrill Center, NP   [facility] Partial Hospitalization Outpatient Program Discharge Summary  Jennifer Bright [id number]  Admission date: 05/26/2019 Discharge date: 06/14/2019  Reason for admission: Per assessment note: Aslee Such  is a 53 y.o. Caucasian female presents with depression and anxiety with a suspected suicide attempt.  Sharyn Lull adamantly denying during this assessment.  "  I cannot remember, I just hears voices all the time and there getting worse." reported that she was recently started on Geodon for the auditory hallucinations.  Reported schedule follow-up with her attending psychiatrist Kura lastnight with a follow-up this morning.  She reports ongoing paranoia and delusions.  Currently denying suicidal or homicidal ideations. Patient was enrolled in partial psychiatric program on 05/26/19.  Chemical Use History: Denied  Family of Origin Issues: Reports her husband continues  to be supportive.  Patient reported multiple inpatient admissions.  States her transition to partial hospitalization programming and intensive outpatient programming will be of great help with her mood.  Patient reports she will continue to follow-up with her insurance company regarding psychologist after discharge from intensive outpatient programming.  Progress in Program Toward Treatment Goals: Ongoing, patient attended and participated with daily group sessions with active and engaged participation.  Patient reported ongoing mood irritability and worsening auditory hallucinations that has not improved since the Geodon was added to her medication regimen.  However states this weekend has been better with the voices that are chronic in nature.  Patient continued to need constant redirection during partial hospitalization programming regarding mood swings.   Progress (rationale):Steping down to Intensive Outpatient Programming  (IOP)  Take all medications as prescribed. Keep all follow-up appointments as scheduled.  Do not consume alcohol or use illegal drugs while on prescription medications. Report any adverse effects from your medications to your primary care provider promptly.  In the event of recurrent symptoms or worsening symptoms, call 911, a crisis hotline, or go to the nearest emergency department for evaluation.   Derrill Center, NP 06/14/2019

## 2019-06-14 NOTE — Telephone Encounter (Signed)
D:  PHP referred pt to MH-IOP.  A:  Placed call to re-orient patient and answer questions if she had any; but there was no answer.  Writer left her name and phone number, in case pt had questions.  Pt will start virtual MH-IOP tomorrow at 9 a.m.  Web-Ex invite has already been sent to pt.

## 2019-06-14 NOTE — Psych (Signed)
Virtual Visit via Video Note  I connected with [name] on 06/01/19 at  9:00 AM EDT by a video enabled telemedicine application and verified that I am speaking with the correct person using two identifiers.   I discussed the limitations of evaluation and management by telemedicine and the availability of in person appointments. The patient expressed understanding and agreed to proceed.   I discussed the assessment and treatment plan with the patient. The patient was provided an opportunity to ask questions and all were answered. The patient agreed with the plan and demonstrated an understanding of the instructions.   The patient was advised to call back or seek an in-person evaluation if the symptoms worsen or if the condition fails to improve as anticipated.  Pt was provided 240 minutes of non-face-to-face time during this encounter.   Lorin Glass, LCSW    [facility] PHP THERAPIST PROGRESS NOTE  KATRINIA STRAKER [id number]  Session Time: 9:00 - 10:00  Participation Level: Active  Behavioral Response: CasualAlertDepressed  Type of Therapy: Group Therapy  Treatment Goals addressed: Coping  Interventions: CBT, DBT, Supportive and Reframing  Summary: Clinician led check-in regarding current stressors and situation, and review of patient completed daily inventory. Clinician utilized active listening and empathetic response and validated patient emotions. Clinician facilitated processing group on pertinent issues.   Therapist Response: AARVI STOTTS is a 53 y.o. female who presents with depression and anxiety symptoms. Patient arrived within time allowed and reports that she is feeling "sick and tired." Patient rates her mood at a 4.5 on a scale of 1-10 with 10 being great. Pt reports feeling frustrated this morning due to family members being unsupportive of her. Pt states "they say they are there for me and then don't answer any of my calls." Pt states she sent out for  a religious cd about finding her strength and is hopeful it may help. Pt states she did not do anything yesterday after group except nap. Pt struggles with mood dependent thinking. Pt engaged in discussion.       Session Time: 10:00 -11:00  Participation Level: Active  Behavioral Response: CasualAlertDepressed  Type of Therapy: Group Therapy, psychoeducation, psychotherapy  Treatment Goals addressed: Coping  Interventions: CBT, DBT, Solution Focused, Supportive and Reframing  Summary: Cln facilitated discussion regarding group members' feelings regarding people in their life who do not understand mental health. Group members shared ways in which they feel misunderstood due to their mental health and how it bothers them.   Therapist Response: Patient participated in discussion and shares her support system "really does not" understand her mental health issues. Pt states multiple have told them to "shake it off" or "pull myself up." Pt reports anger at their unsolicited opinions and suggestions and becomes escalated when discussing it. Pt able to process. Pt reports being open to attending a support group to be around people who "get it."        Session Time: 11:00- 12:00  Participation Level: Active  Behavioral Response: CasualAlertDepressed  Type of Therapy: Group Therapy, Psychoeducation; Psychotherapy  Treatment Goals addressed: Coping  Interventions: CBT; Solution focused; Supportive; Reframing  Summary: Cln introduced distress tolerance skills, reviewing their purpose and how to practice them. Cln introduced STOP skill and group discussed situations in which they could apply this skill.      Therapist Response: Pt reports understanding of skill and states that she could apply STOP when she is ruminating.       Session Time: 12:00 -  1:00  Participation Level:Active  Behavioral Response:CasualAlertDepressed  Type of Therapy: Group  Therapy, OT  Treatment Goals addressed: Coping  Interventions:Psychosocial skills training, Supportive,   Summary:12:00 - 12:50:Occupational Therapy group 12:50 -1:00 Clinician led check-out. Clinician assessed for immediate needs, medication compliance and efficacy, and safety concerns   Therapist Response: 12:00 - 12:50: Patient engaged in group. See OT note.  12:50 - 1:00: At check-out, patient rates her mood at a 4 on a scale of 1-10 with 10 being great. Pt states afternoon plans of "staying around the house." Patient demonstrates some progress as evidenced by participation. Patient denies SI/HI/self-harm at the end of group.    Suicidal/Homicidal: Nowithout intent/plan  Plan: Pt will continue in PHP while working to stabilize mood and increase ability to self-manage her symptoms as they arise.   Diagnosis: Bipolar disorder, current episode depressed, severe, with psychotic features ([facility]) [F31.5]    1. Bipolar disorder, current episode depressed, severe, with psychotic features ([facility])       Donia GuilesJenny Swayze Kozuch, LCSW 06/14/2019

## 2019-06-15 ENCOUNTER — Other Ambulatory Visit: Payer: Self-pay

## 2019-06-15 ENCOUNTER — Ambulatory Visit (HOSPITAL_COMMUNITY): Payer: Medicare Other

## 2019-06-15 ENCOUNTER — Other Ambulatory Visit (HOSPITAL_COMMUNITY): Payer: Medicare Other

## 2019-06-16 ENCOUNTER — Other Ambulatory Visit (HOSPITAL_COMMUNITY): Payer: Medicare Other | Admitting: Family

## 2019-06-16 ENCOUNTER — Other Ambulatory Visit (HOSPITAL_COMMUNITY): Payer: Medicare Other

## 2019-06-16 ENCOUNTER — Other Ambulatory Visit: Payer: Self-pay

## 2019-06-16 ENCOUNTER — Encounter (HOSPITAL_COMMUNITY): Payer: Self-pay

## 2019-06-16 ENCOUNTER — Ambulatory Visit (HOSPITAL_COMMUNITY): Payer: Medicare Other

## 2019-06-16 NOTE — Progress Notes (Signed)
Attempted to follow-up with patient to start IOP. No answer at the number provided in chart.

## 2019-06-18 NOTE — Psych (Signed)
Virtual Visit via Video Note  I connected with Juan QuamMichele F Baggerly on 06/05/19 at  9:00 AM EDT by a video enabled telemedicine application and verified that I am speaking with the correct person using two identifiers.   I discussed the limitations of evaluation and management by telemedicine and the availability of in person appointments. The patient expressed understanding and agreed to proceed.   I discussed the assessment and treatment plan with the patient. The patient was provided an opportunity to ask questions and all were answered. The patient agreed with the plan and demonstrated an understanding of the instructions.   The patient was advised to call back or seek an in-person evaluation if the symptoms worsen or if the condition fails to improve as anticipated.  Pt was provided 240 minutes of non-face-to-face time during this encounter.   Donia GuilesJenny Sonda Coppens, LCSW    [facility]  Juan QuamMichele F Batzel [id number]  Session Time: 9:00 - 10:00  Participation Level: Active  Behavioral Response: CasualAlertDepressed  Type of Therapy: Group Therapy  Treatment Goals addressed: Coping  Interventions: CBT, DBT, Supportive and Reframing  Summary: Clinician led check-in regarding current stressors and situation, and review of patient completed daily inventory. Clinician utilized active listening and empathetic response and validated patient emotions. Clinician facilitated processing group on pertinent issues.   Therapist Response: Juan QuamMichele F Russett is a 53 y.o. female who presents with depression and anxiety symptoms. Patient arrived within time allowed and reports that she is feeling "not great." Patient rates her mood at a 5 on a scale of 1-10 with 10 being great. Pt reports her weekend was "very bad" and she fought with her husband all weekend which they "never" do. Pt reports they had planned to spend the whole weekend together, but her husband brought home work and  then answered work calls. Pt appears to blame her son, who husband works for, just as much if not more, than her husband and is unable to vocalize the conflict when asked. Pt shares attempting 2 skills however the voices were "very bad" and she was upset that the skills did not make them go away. Pt able to remember that skills do not make the feelings go away but increase ability to manage, however continues to struggle with this truth. Pt able to process. Pt engaged in discussion.       Session Time: 10:00 -11:00  Participation Level: Active  Behavioral Response: CasualAlertDepressed  Type of Therapy: Group Therapy, psychoeducation, psychotherapy  Treatment Goals addressed: Coping  Interventions: CBT, DBT, Solution Focused, Supportive and Reframing  Summary:  Cln led discussion on ways in which we let other people's reactions affect us. Group shared things they do or do not do because of negative reactions from others. Cln offered ways to reframe our perceptions including boundary tenets, protective factors, and work arounds.    Therapist Response: Patient participated in discussion and is able to identify that she cares "so much" about how people react to her, specifically her children. Pt struggles to understand how she could manage the disappointment and that she has control of her reactions to other people's behaviors. Pt reports desire to not let their reactions bother her, however is skeptical, "but hopeful" that it is possible.       Session Time: 11:00- 12:00  Participation Level: Active  Behavioral Response: CasualAlertDepressed  Type of Therapy: Group Therapy, Psychoeducation; Psychotherapy  Treatment Goals addressed: Coping  Interventions: CBT; DBT; Solution focused; Supportive; Reframing  Summary:  Clinician continued topic of distress tolerance skills and introduced ACCEPTS skills. Group members discussed ways they could utilize the skills in  their everyday life.   Therapist Response: Pt participated in discussion and reports understanding of ACCEPTS skills. Pt states she is most likely to use activities.       Session Time: 12:00 -1:00  Participation Level: Active  Behavioral Response: CasualAlertDepressed  Type of Therapy: Group Therapy, Psychoeducation; Psychotherapy  Treatment Goals addressed: Coping  Interventions: CBT; DBT; Solution focused; Supportive; Reframing  Summary:12:00 - 12:50:Cln continued topic of distress tolerance skills and led activity on planning ahead to increase ability to utilize skills. Group members completed different emotion cards, brainstormed all options of activities, and made lists of shows, books, music, and podcasts to listen to. Group shared how they would make these plans easily accessible.    12:50 -1:00 Clinician led check-out. Clinician assessed for immediate needs, medication compliance and efficacy, and safety concerns   Therapist Response: 12:00 - 12:40: Patient minimally engaged in activity and chose to leave group at 12:40 abruptly stating there were "too many voices" and leaving group. Cln attempted to follow-up by phone but was unable to connect with pt.     Suicidal/Homicidal: Nowithout intent/plan  Plan: Pt will continue in PHP while working to stabilize mood and increase ability to self-manage her symptoms as they arise.   Diagnosis: Bipolar disorder, current episode depressed, severe, with psychotic features ([place]) [F31.5]    1. Bipolar disorder, current episode depressed, severe, with psychotic features ([place])       Lorin Glass, LCSW 06/18/2019

## 2019-06-19 ENCOUNTER — Ambulatory Visit (HOSPITAL_COMMUNITY): Payer: Medicare Other

## 2019-06-19 NOTE — Psych (Signed)
Virtual Visit via Video Note  I connected with Jennifer Bright on 06/06/19 at  9:00 AM EDT by a video enabled telemedicine application and verified that I am speaking with the correct person using two identifiers.   I discussed the limitations of evaluation and management by telemedicine and the availability of in person appointments. The patient expressed understanding and agreed to proceed.   I discussed the assessment and treatment plan with the patient. The patient was provided an opportunity to ask questions and all were answered. The patient agreed with the plan and demonstrated an understanding of the instructions.   The patient was advised to call back or seek an in-person evaluation if the symptoms worsen or if the condition fails to improve as anticipated.  Pt was provided 240 minutes of non-face-to-face time during this encounter.   Lorin Glass, LCSW    [facility] [place] PHP THERAPIST PROGRESS NOTE  Jennifer Bright [id number]  Session Time: 9:00 - 10:00  Participation Level: Active  Behavioral Response: CasualAlertDepressed  Type of Therapy: Group Therapy  Treatment Goals addressed: Coping  Interventions: CBT, DBT, Supportive and Reframing  Summary: Clinician led check-in regarding current stressors and situation, and review of patient completed daily inventory. Clinician utilized active listening and empathetic response and validated patient emotions. Clinician facilitated processing group on pertinent issues.   Therapist Response: Jennifer Bright is a 53 y.o. female who presents with depression and anxiety symptoms. Patient arrived within time allowed and reports that she is feeling "a little better." Patient rates her mood at a 5 on a 1-10 scale with 10 being great. Pt reports she thinks she struggled yesterday at the end of group because she moved location and did not have a wall behind her which caused her to feel "exposed." Pt states it took her most of the  afternoon to calm herself down after becoming escalated and her husband came home to be with her. Pt is able to recognize ways to learn from the experience such as catching signs earlier, utilizing distraction skills, and not trying to "get through it" before acting. Pt able to process. Pt engaged in discussion.      Session Time: 10:00 -11:00  Participation Level: Active  Behavioral Response: CasualAlertDepressed  Type of Therapy: Group Therapy, psychoeducation, psychotherapy  Treatment Goals addressed: Coping  Interventions: CBT, DBT, Solution Focused, Supportive and Reframing  Summary: Cln led discussion on ways to ground using distress tolerance skills. Cln discussed ways grounding can be useful for trauma symptoms.   Therapist Response: Patient participated in discussion and identified counting things in the room as ways she will utilize grounding.      Session Time: 11:00- 12:00  Participation Level: Active  Behavioral Response: CasualAlertDepressed  Type of Therapy: Group Therapy, Psychoeducation; Psychotherapy  Treatment Goals addressed: Coping  Interventions: CBT; Solution focused; Supportive; Reframing  Summary: Clinician continued topic of distress tolerance skills and introduced Self-Soothe skills. Group members discussed ways they could utilize the skills in their everyday life.      Therapist Response: Pt reports understanding of skill and states taking a bath, petting her dog, and wrapping up in blankets as ways to practice the skill.       Session Time: 12:00 -1:00  Participation Level:Active  Behavioral Response:CasualAlertDepressed  Type of Therapy: Group Therapy, OT  Treatment Goals addressed: Coping  Interventions:Psychosocial skills training, Supportive,   Summary:12:00 - 12:50:Occupational Therapy group 12:50 -1:00 Clinician led check-out. Clinician assessed for immediate needs, medication compliance and  efficacy,  and safety concerns   Therapist Response: 12:00 - 12:50: Patient engaged in group. See OT note.  12:50 - 1:00: At check-out, patient rates her mood at a 6 on a scale of 1-10 with 10 being great. Pt states afternoon plans of having lunch with her husband and resting. Patient demonstrates some progress as evidenced by recognizing ways to improve a negative situation. Patient denies SI/HI/self-harm at the end of group.    Suicidal/Homicidal: Nowithout intent/plan  Plan: Pt will continue in PHP while working to stabilize mood and increase ability to self-manage her symptoms as they arise.   Diagnosis: Bipolar disorder, current episode depressed, severe, with psychotic features ([facility]) [F31.5]    1. Bipolar disorder, current episode depressed, severe, with psychotic features ([facility])       Donia GuilesJenny Alois Colgan, LCSW 06/19/2019

## 2019-06-19 NOTE — Psych (Signed)
Virtual Visit via Video Note  I connected with [name] on 06/09/19 at  9:00 AM EDT by a video enabled telemedicine application and verified that I am speaking with the correct person using two identifiers.   I discussed the limitations of evaluation and management by telemedicine and the availability of in person appointments. The patient expressed understanding and agreed to proceed.   I discussed the assessment and treatment plan with the patient. The patient was provided an opportunity to ask questions and all were answered. The patient agreed with the plan and demonstrated an understanding of the instructions.   The patient was advised to call back or seek an in-person evaluation if the symptoms worsen or if the condition fails to improve as anticipated.  Pt was provided 240 minutes of non-face-to-face time during this encounter.   Lorin Glass, LCSW    [facility] [place] PHP THERAPIST PROGRESS NOTE  LUTISHA KNOCHE [id number]  Session Time: 9:00 - 10:00  Participation Level: Active  Behavioral Response: CasualAlertDepressed  Type of Therapy: Group Therapy  Treatment Goals addressed: Coping  Interventions: CBT, DBT, Supportive and Reframing  Summary: Clinician led check-in regarding current stressors and situation, and review of patient completed daily inventory. Clinician utilized active listening and empathetic response and validated patient emotions. Clinician facilitated processing group on pertinent issues.   Therapist Response: NILI HONDA is a 53 y.o. female who presents with depression and anxiety symptoms. Patient arrived within time allowed and reports that she is feeling "frazzled." Patient rates her mood at a 3.5 on a 1-10 scale with 10 being great. Pt reports she feels she is not processing things well this morning and that the voices are loud this morning. Pt states she feels she may have passed out yesterday due to feeling so tired last night and  disoriented today. Pt reports she plans to set new boundaries with her children this weekend and needs strength for it. Pt able to process. Pt engaged in discussion.        Session Time: 10:00 -11:00  Participation Level: Active  Behavioral Response: CasualAlertDepressed  Type of Therapy: Group Therapy, psychoeducation, psychotherapy  Treatment Goals addressed: Coping  Interventions: CBT, DBT, Solution Focused, Supportive and Reframing  Summary: Cln led discussion on managing anxiety regarding life post-treatment. Cln encouraged pt's to consider which aspects of treatment have been helpful to them and replicate it in their post-treatment life. Group discussed ways to establish routine and creatively meet needs while in different schedules.   Therapist Response: Patient participated in discussion and reports she feels routine and cues for skills are what she needs to replicate post-treatment. Pt is able to brainstorm ways to meet those needs such as post-it notes with skills, educating her husband about what is helpful to her, and continuing outpatient therapy.        Session Time: 11:00- 12:00  Participation Level: Active  Behavioral Response: CasualAlertDepressed  Type of Therapy: Group Therapy, Psychoeducation; Psychotherapy  Treatment Goals addressed: Coping  Interventions: CBT; Solution focused; Supportive; Reframing  Summary: Clinician introduced topic of "Positive Psychology." Group watched "The Happiness Advantage" TED talk and discussed how the "lens" through which they view life affects the way they feel. Pts identified a strategy they would be willing to try to change their "lens."    Therapist Response: Pt engaged in discussion regarding ways to train your mind to scan for the positive. Pt reports willingness to try daily gratitudes as a way to practice.  Session Time: 12:00 -1:00  Participation Level:Active  Behavioral  Response:CasualAlertDepressed  Type of Therapy: Group Therapy, OT  Treatment Goals addressed: Coping  Interventions:Psychosocial skills training, Supportive,   Summary:12:00 - 12:50:Occupational Therapy group 12:50 -1:00 Clinician led check-out. Clinician assessed for immediate needs, medication compliance and efficacy, and safety concerns   Therapist Response: 12:00 - 12:50: Patient engaged in group. See OT note.  12:50 - 1:00: At check-out, patient rates her mood at a 6 on a scale of 1-10 with 10 being great. Pt states afternoon plans of cleaning. Patient demonstrates some progress as evidenced by increased ability to maintain mood when elevated. Patient denies SI/HI/self-harm at the end of group.    Suicidal/Homicidal: Nowithout intent/plan  Plan: Pt will continue in PHP while working to stabilize mood and increase ability to self-manage her symptoms as they arise.   Diagnosis: Bipolar disorder, current episode depressed, severe, with psychotic features ([facility]) [F31.5]    1. Bipolar disorder, current episode depressed, severe, with psychotic features ([facility])       Donia GuilesJenny Naiomi Musto, LCSW 06/19/2019

## 2019-06-19 NOTE — Psych (Signed)
Virtual Visit via Video Note  I connected with Jennifer Bright on 06/08/19 at  9:00 AM EDT by a video enabled telemedicine application and verified that I am speaking with the correct person using two identifiers.   I discussed the limitations of evaluation and management by telemedicine and the availability of in person appointments. The patient expressed understanding and agreed to proceed.   I discussed the assessment and treatment plan with the patient. The patient was provided an opportunity to ask questions and all were answered. The patient agreed with the plan and demonstrated an understanding of the instructions.   The patient was advised to call back or seek an in-person evaluation if the symptoms worsen or if the condition fails to improve as anticipated.  Pt was provided 240 minutes of non-face-to-face time during this encounter.   Jennifer Glass, LCSW    [facility] [place] PHP THERAPIST PROGRESS NOTE  Jennifer Bright [id number]  Session Time: 9:00 - 10:00  Participation Level: Active  Behavioral Response: CasualAlertDepressed  Type of Therapy: Group Therapy  Treatment Goals addressed: Coping  Interventions: CBT, DBT, Supportive and Reframing  Summary: Clinician led check-in regarding current stressors and situation, and review of patient completed daily inventory. Clinician utilized active listening and empathetic response and validated patient emotions. Clinician facilitated processing group on pertinent issues.   Therapist Response: Jennifer Bright is a 53 y.o. female who presents with depression and anxiety symptoms. Patient arrived within time allowed and reports that she is feeling "distracted." Patient rates her mood at a 5 on a 1-10 scale with 10 being great. Pt reports she has "a lot of stuff on my mind." Pt states she rested most of the day yesterday and had a good morning because she was able to have more time than normal with her husband. Pt shares she is  stressing about plans for the weekend. Pt states she does not like to plan past the day because it causes her anxiety and she was asked to think about weekend plans multiple times yesterday. Pt able to process. Pt engaged in discussion.        Session Time: 10:00 -11:00  Participation Level: Active  Behavioral Response: CasualAlertDepressed  Type of Therapy: Group Therapy, psychoeducation, psychotherapy  Treatment Goals addressed: Coping  Interventions: CBT, DBT, Solution Focused, Supportive and Reframing  Summary: Cln led discussion on how to discuss absences or altered behaviors due to mental health. Group shared concerns regarding returning to work or answering the question "what have you been up to" as stressors. Cln processed with pt's and brainstormed with group different ways to handle these stressors as they arise.   Therapist Response: Patient participated in discussion and shares her concern is how to address things people have been told by someone else. Pt shares some family members shared what she was dealing with without pt's approval. Pt is able to brainstorm and receive feedback about ways to handle it if it comes up.      Session Time: 11:00- 12:00  Participation Level: Active  Behavioral Response: CasualAlertDepressed  Type of Therapy: Group Therapy, Psychoeducation; Psychotherapy  Treatment Goals addressed: Coping  Interventions: CBT; Solution focused; Supportive; Reframing  Summary: Clinician introduced topic of fear setting. Group viewed TED talk "Why you should define your fears instead of your goals."  Group utilized fear setting model to work through a current fear and analyze it.     Therapist Response: Patient engaged in activity and discussion. Pt verbally completed fear setting model  with a group example and reports confidence in applying it personally.      Session Time: 12:00 -1:00  Participation  Level:Active  Behavioral Response:CasualAlertDepressed  Type of Therapy: Group Therapy, OT  Treatment Goals addressed: Coping  Interventions:Psychosocial skills training, Supportive,   Summary:12:00 - 12:50:Occupational Therapy group 12:50 -1:00 Clinician led check-out. Clinician assessed for immediate needs, medication compliance and efficacy, and safety concerns   Therapist Response: 12:00 - 12:50: Patient engaged in group. See OT note.  12:50 - 1:00: At check-out, patient rates her mood at a 6 on a scale of 1-10 with 10 being great. Pt states afternoon plans of packing, napping, and spending time with her husband. Patient demonstrates some progress as evidenced by being able to list gratitudes. Patient denies SI/HI/self-harm at the end of group.     Suicidal/Homicidal: Nowithout intent/plan  Plan: Pt will continue in PHP while working to stabilize mood and increase ability to self-manage her symptoms as they arise.   Diagnosis: Bipolar disorder, current episode depressed, severe, with psychotic features ([facility]) [F31.5]    1. Bipolar disorder, current episode depressed, severe, with psychotic features ([facility])       Jennifer GuilesJenny Massiah Minjares, LCSW 06/19/2019
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
## 2019-06-19 NOTE — Psych (Signed)
Virtual Visit via Video Note  I connected with Jennifer Bright on 06/14/19 at  9:00 AM EDT by a video enabled telemedicine application and verified that I am speaking with the correct person using two identifiers.   I discussed the limitations of evaluation and management by telemedicine and the availability of in person appointments. The patient expressed understanding and agreed to proceed.   I discussed the assessment and treatment plan with the patient. The patient was provided an opportunity to ask questions and all were answered. The patient agreed with the plan and demonstrated an understanding of the instructions.   The patient was advised to call back or seek an in-person evaluation if the symptoms worsen or if the condition fails to improve as anticipated.  Pt was provided 240 minutes of non-face-to-face time during this encounter.   Lorin Glass, LCSW    [facility] PHP THERAPIST PROGRESS NOTE  Jennifer Bright [id number]  Session Time: 9:00 - 10:00  Participation Level: Active  Behavioral Response: CasualAlertDepressed  Type of Therapy: Group Therapy  Treatment Goals addressed: Coping  Interventions: CBT, DBT, Supportive and Reframing  Summary: Clinician led check-in regarding current stressors and situation, and review of patient completed daily inventory. Clinician utilized active listening and empathetic response and validated patient emotions. Clinician facilitated processing group on pertinent issues.   Therapist Response: Jennifer Bright is a 53 y.o. female who presents with depression and anxiety symptoms. Patient arrived within time allowed and reports that she is feeling "positive." Patient rates her mood at a 5.5 on a 1-10 scale with 10 being great. Pt reports her physical health issues continue and she is trying to stay focused on her appointment next week. Pt shares she had a good evening with her husband and they spent time outside since the evening  was cooler. Pt states she shared group handouts with her husband last night and thinks it made things click for him. Pt states feeling validated that he connected topics on handouts with pt's symptoms as well as her recent coping efforts and felt seen and understood. Pt able to process. Pt engaged in discussion.        Session Time: 10:00-11:00  Participation Level:Active  Behavioral Response:CasualAlertDepressed  Type of Therapy: Group Therapy, psychoeducation, psychotherapy  Treatment Goals addressed: Coping  Interventions:CBT, DBT, Solution Focused, Supportive and Reframing  Summary:Cln continued topic of boundaries. Cln reviewed how to set an assertive boundary and how to hold the boundary when met with resistance. Group shared experiences in which they struggled with boundaries and discussed how they could address the issue.      Therapist Response: Patient participated in discussion and shares issues with her daughter in terms of reinforcing boundaries. Pt states she is ready to terminate the relationship if changes cannot be made and feels strong enough "to do what I have to do."      Session Time: 11:00 -12:15  Participation Level: Active  Behavioral Response: CasualAlertDepressed  Type of Therapy: Group Therapy, psychotherapy  Treatment Goals addressed: Coping  Interventions: Strengths based, reframing, Supportive,   Summary:  Spiritual Care group  Therapist Response: Patient engaged in group. See chaplain note.        Session Time: 12:00- 1:00  Participation Level: Active  Behavioral Response: CasualAlertDepressed  Type of Therapy: Group Therapy, Psychoeducation  Treatment Goals addressed: Coping  Interventions: relaxation training; Supportive; Reframing  Summary: 12:00 - 12:50: Relaxation group: Cln led group focused on retraining the body's response to stress.  12:50 -1:00 Clinician led check-out. Clinician  assessed for immediate needs, medication compliance and efficacy, and safety concerns   Therapist Response: Patient engaged in activity and discussion. At [place], patient rates her mood at a 7.5 on a scale of 1-10 with 10 being great. Patient reports afternoon plans of resting and "taking it easy" and going on a walk with her husband in the evening. Patient demonstrates some progress as evidenced by surety in applying skills. Patient denies SI/HI/self-harm thoughts at the end of group.      Suicidal/Homicidal: Nowithout intent/plan  Plan: Pt will discharge from PHP due to meeting treatment goals of stabilized mood and increased ability to self-manage her symptoms as they arise. Pt will step down to IOP within this agency on 8/27 to further increase strength of management. Pt and provider are aligned with discharge. Pt denies SI/HI at time of discharge.    Diagnosis: Bipolar disorder, current episode depressed, severe, with psychotic features ([place]) [F31.5]    1. Bipolar disorder, current episode depressed, severe, with psychotic features ([place])       Lorin Glass, LCSW 06/19/2019
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
## 2019-06-19 NOTE — Psych (Signed)
Virtual Visit via Video Note  I connected with Jennifer Bright on 06/12/19 at  9:00 AM EDT by a video enabled telemedicine application and verified that I am speaking with the correct person using two identifiers.   I discussed the limitations of evaluation and management by telemedicine and the availability of in person appointments. The patient expressed understanding and agreed to proceed.   I discussed the assessment and treatment plan with the patient. The patient was provided an opportunity to ask questions and all were answered. The patient agreed with the plan and demonstrated an understanding of the instructions.   The patient was advised to call back or seek an in-person evaluation if the symptoms worsen or if the condition fails to improve as anticipated.  Pt was provided 240 minutes of non-face-to-face time during this encounter.   Lorin Glass, LCSW    [facility] PHP THERAPIST PROGRESS NOTE  Jennifer Bright [id number]  Session Time: 9:00 - 10:00  Participation Level: Active  Behavioral Response: CasualAlertDepressed  Type of Therapy: Group Therapy  Treatment Goals addressed: Coping  Interventions: CBT, DBT, Supportive and Reframing  Summary: Clinician led check-in regarding current stressors and situation, and review of patient completed daily inventory. Clinician utilized active listening and empathetic response and validated patient emotions. Clinician facilitated processing group on pertinent issues.   Therapist Response: Jennifer Bright is a 53 y.o. female who presents with depression and anxiety symptoms. Patient arrived within time allowed and reports that she is feeling "not good." Patient rates her mood at a 3 on a scale of 1-10 with 10 being great. Pt reports she is experiencing physical health issues which are upsetting her. Pt reports she is experiencing a flare up of an ankle injury that brought on this current spiral. Pt states she is "tired of not  feeling well" and not getting answers from her doctors. Pt shares other than her pain, her weekend went well and she was able to set the new boundaries with family as she had planned. Pt engaged in discussion.       Session Time: 10:00 -11:00  Participation Level: Active  Behavioral Response: CasualAlertDepressed  Type of Therapy: Group Therapy, psychoeducation, psychotherapy  Treatment Goals addressed: Coping  Interventions: CBT, DBT, Solution Focused, Supportive and Reframing  Summary: Cln led discussion on giving yourself credit. Group utilized handout "My Strengths and Qualities" and pt's discussed how easy or difficult it was to complete and why. Cln challenged pt's to think of one good thing about themselves daily.    Therapist Response: Patient participated in discussion and identified that she tends to be too hard on herself. Pt reports it was difficult for her to complete the strengths and qualities handout because she does not always feel there are good things about herself. Pt able to process and accepts challenge to think of something good about herself daily.       Session Time: 11:00- 12:00  Participation Level: Active  Behavioral Response: CasualAlertDepressed  Type of Therapy: Group Therapy, Psychoeducation; Psychotherapy  Treatment Goals addressed: Coping  Interventions: CBT; DBT; Solution focused; Supportive; Reframing  Summary: Cln introduced topic of boundaries. Cln provided psychoeducation on what boundaries are and why they are important. Group discussed characteristics of porous, rigid and healthy boundaries and what characteristics they see in themselves.   Therapist Response: Patient engaged in group. Pt reports understanding of boundaries and shares she has mainly porous boundaries.       Session Time: 12:00 -1:00  Participation  Level: Active  Behavioral Response: CasualAlertDepressed  Type of Therapy: Group  Therapy, Psychoeducation; Psychotherapy  Treatment Goals addressed: Coping  Interventions: CBT; DBT; Solution focused; Supportive; Reframing  Summary:12:00 - 12:50:Cln continued topic of boundaries and shared the ways in which boundaries present. Group discussed the different types of boundaries and which they struggle with most.     12:50 -1:00 Clinician led check-out. Clinician assessed for immediate needs, medication compliance and efficacy, and safety concerns   Therapist Response: 12:00 - 12:50: Patient engaged in group. Pt reports struggling most with emotional boundaries.  12:50 - 1:00: At check-out, patient rates her mood at a 3 on a scale of 1-10 with 10 being great. Pt states afternoon plans of spending time with her dogs and relaxing. Patient demonstrates some progress as evidenced by finding a new distraction, word searches, over the weekend and utilizing it. Patient denies SI/HI/self-harm at the end of group.   Suicidal/Homicidal: Nowithout intent/plan  Plan: Pt will continue in PHP while working to stabilize mood and increase ability to self-manage her symptoms as they arise.   Diagnosis: Bipolar disorder, current episode depressed, severe, with psychotic features (HCC) [F31.5]    1. Bipolar disorder, current episode depressed, severe, with psychotic features (HCC)       Donia GuilesJenny Elesa Garman, LCSW 06/19/2019

## 2019-06-20 ENCOUNTER — Ambulatory Visit (HOSPITAL_COMMUNITY): Payer: Medicare Other

## 2019-06-21 ENCOUNTER — Ambulatory Visit (HOSPITAL_COMMUNITY): Payer: Medicare Other

## 2019-06-22 ENCOUNTER — Ambulatory Visit (HOSPITAL_COMMUNITY): Payer: Medicare Other

## 2019-06-23 ENCOUNTER — Ambulatory Visit (HOSPITAL_COMMUNITY): Payer: Medicare Other

## 2019-06-27 ENCOUNTER — Ambulatory Visit (HOSPITAL_COMMUNITY): Payer: Medicare Other

## 2019-06-28 ENCOUNTER — Ambulatory Visit (HOSPITAL_COMMUNITY): Payer: Medicare Other

## 2019-06-29 ENCOUNTER — Ambulatory Visit (HOSPITAL_COMMUNITY): Payer: Medicare Other

## 2019-06-30 ENCOUNTER — Ambulatory Visit (HOSPITAL_COMMUNITY): Payer: Medicare Other

## 2019-07-10 DIAGNOSIS — N1831 Chronic kidney disease, stage 3a: Secondary | ICD-10-CM | POA: Insufficient documentation

## 2019-07-10 DIAGNOSIS — R809 Proteinuria, unspecified: Secondary | ICD-10-CM | POA: Insufficient documentation

## 2019-07-17 ENCOUNTER — Other Ambulatory Visit
Admission: RE | Admit: 2019-07-17 | Discharge: 2019-07-17 | Disposition: A | Discharge status: Home / Self Care | Payer: Medicare Other | Source: Ambulatory Visit | Attending: Specialist | Admitting: Specialist

## 2019-07-17 DIAGNOSIS — R0789 Other chest pain: Secondary | ICD-10-CM | POA: Diagnosis present

## 2019-07-17 LAB — FIBRIN DERIVATIVES D-DIMER (ARMC ONLY): Fibrin derivatives D-dimer (ARMC): 142 ng/mL (FEU) (ref 0.00–499.00)

## 2019-07-28 LAB — HM DIABETES EYE EXAM

## 2019-08-14 ENCOUNTER — Ambulatory Visit (INDEPENDENT_AMBULATORY_CARE_PROVIDER_SITE_OTHER): Payer: Medicare Other | Admitting: Obstetrics & Gynecology

## 2019-08-14 ENCOUNTER — Other Ambulatory Visit: Payer: Self-pay

## 2019-08-14 ENCOUNTER — Encounter: Payer: Self-pay | Admitting: Obstetrics & Gynecology

## 2019-08-14 VITALS — BP 110/60 | Ht 64.0 in | Wt 163.0 lb

## 2019-08-14 DIAGNOSIS — Z1231 Encounter for screening mammogram for malignant neoplasm of breast: Secondary | ICD-10-CM

## 2019-08-14 DIAGNOSIS — Z1211 Encounter for screening for malignant neoplasm of colon: Secondary | ICD-10-CM

## 2019-08-14 DIAGNOSIS — Z01419 Encounter for gynecological examination (general) (routine) without abnormal findings: Secondary | ICD-10-CM

## 2019-08-14 DIAGNOSIS — N952 Postmenopausal atrophic vaginitis: Secondary | ICD-10-CM

## 2019-08-14 MED ORDER — OSPHENA 60 MG PO TABS: 1.0000 | ORAL_TABLET | Freq: Every day | ORAL | 3 refills | Status: DC

## 2019-08-14 NOTE — Patient Instructions (Signed)
PAP every 5 years Mammogram every year Colonoscopy every 10 years    Stool cards today Labs yearly (with PCP)

## 2019-08-14 NOTE — Progress Notes (Signed)
HPI:      [name] is a 53 y.o. [id number] who LMP was in the past and she is s/p hysterectomy, she presents today for her annual examination.  The patient has no complaints today. The patient is sexually active. Vaginal dryness and pain improved w Osphena (2 years now). She has some urinary concerns with incomplete emptying and sees a urologist ssom.  Prior bladder suspension surgery years ago, occas leakage esp though at night.  Wears pads/diaper to sleep.    Herlast pap: approximate date 2019 and was normal and last mammogram: approximate date 2019 and then additional images thereafter and was normal.  The patient does perform self breast exams.  There is no notable family history of breast or ovarian cancer in her family. The patient is not taking hormone replacement therapy. Patient denies post-menopausal vaginal bleeding.   The patient has regular exercise: yes. The patient denies current symptoms of depression.    GYN Hx: Last Colonoscopy:2 years ago. Normal.   PMHx: Past Medical History:  Diagnosis Date  . Acid reflux   . Anemia   . Anxiety   . Bipolar disorder (HCC)   . Chronic kidney disease    STAGE 3  . Depression   . Diabetes mellitus    NIDD  . Essential tremor   . Family history of ovarian cancer    Pt doesn't meet Medicare genetic testing guidelines  . Hyperlipidemia   . Hypothyroid   . IBS (irritable bowel syndrome)   . Migraine headache    vestibular migraine  . Obesity   . Osteopenia   . Restless leg syndrome   . Sarcoidosis   . Sleep apnea   . Syncope \   Past Surgical History:  Procedure Laterality Date  . ABDOMINAL HYSTERECTOMY    . APPENDECTOMY    . BLADDER SURGERY     bladder tact  . CATARACT EXTRACTION W/PHACO Right 09/21/2018   Procedure: CATARACT EXTRACTION PHACO AND INTRAOCULAR LENS PLACEMENT (IOC);  Surgeon: Nevada CraneKing, Bradley Mark, MD;  Location: [facility] ORS;  Service: Ophthalmology;  Laterality: Right;  US  00:20 CDE 00.82 Fluid pack lot  # [id number]  . CATARACT EXTRACTION W/PHACO Left 10/17/2018   Procedure: CATARACT EXTRACTION PHACO AND INTRAOCULAR LENS PLACEMENT (IOC)  LEFT;  Surgeon: Nevada CraneKing, Bradley Mark, MD;  [facility];  Service: Ophthalmology;  Laterality: Left;  diabetic - diet controlled  . CESAREAN SECTION    . CHOLECYSTECTOMY    . COLONOSCOPY WITH PROPOFOL N/A 04/05/2017   Procedure: COLONOSCOPY WITH PROPOFOL;  Surgeon: Christena DeemSkulskie, Martin U, MD;  [facility];  Service: Endoscopy;  Laterality: N/A;  . ESOPHAGOGASTRODUODENOSCOPY (EGD) WITH PROPOFOL N/A 08/28/2016   Procedure: ESOPHAGOGASTRODUODENOSCOPY (EGD) WITH PROPOFOL;  Surgeon: [name], MD;  [facility];  Service: Endoscopy;  Laterality: N/A;  . HERNIA REPAIR    . INGUINAL HERNIA REPAIR     rt and left  . lexiscan cardiolite    . TILT TABLE STUDY N/A 04/01/2012   Procedure: TILT TABLE STUDY;  Surgeon: [name], MD;  [facility];  Service: Cardiovascular;  Laterality: N/A;   Family History  Problem Relation Age of Onset  . Colon cancer Father   . Stomach cancer Father   . Hypertension Mother   . Hyperlipidemia Mother   . Alcohol abuse Brother   . Irritable bowel syndrome Sister   . Breast cancer Paternal Aunt   . Ovarian cancer Paternal Aunt   . Cancer  Cousin   . Cancer Cousin   . Ovarian cancer Cousin   . Kidney cancer Neg Hx   . Kidney disease Neg Hx   . Prostate cancer Neg Hx    Social History   Tobacco Use  . Smoking status: Former Smoker    Types: Cigarettes    Quit date: 2015    Years since quitting: 5.8  . Smokeless tobacco: Never Used  Substance Use Topics  . Alcohol use: No  . Drug use: No    Current Outpatient Medications:  .  ALPRAZolam (XANAX) 1 MG tablet, Take 1 mg by mouth 3 (three) times daily as needed for anxiety or sleep., Disp: , Rfl:  .  Armodafinil 250 MG tablet, Take 250 mg by mouth daily., Disp: , Rfl:  .  atorvastatin (LIPITOR) 40 MG tablet, Take 40 mg  by mouth at bedtime. , Disp: , Rfl:  .  benztropine (COGENTIN) 1 MG tablet, Take 1 mg by mouth 2 (two) times daily. , Disp: , Rfl:  .  buPROPion (WELLBUTRIN XL) 300 MG 24 hr tablet, Take 300 mg by mouth daily., Disp: , Rfl:  .  docusate sodium (COLACE) 100 MG capsule, Take 100 mg by mouth 2 (two) times daily. , Disp: , Rfl:  .  furosemide (LASIX) 20 MG tablet, Take 20 mg by mouth daily., Disp: , Rfl:  .  levothyroxine (SYNTHROID, LEVOTHROID) 100 MCG tablet, Take 100 mcg by mouth daily before breakfast. Takes at bedtime, Disp: , Rfl:  .  liothyronine (CYTOMEL) 5 MCG tablet, Take 5 mcg by mouth 2 (two) times daily. , Disp: , Rfl:  .  methocarbamol (ROBAXIN) 750 MG tablet, Take 750 mg by mouth as needed for muscle spasms., Disp: , Rfl:  .  Nutritional Supplements (MELATONIN PO), Take 10 mg by mouth at bedtime. , Disp: , Rfl:  .  Ospemifene (OSPHENA) 60 MG TABS, Take 1 tablet by mouth daily., Disp: 90 tablet, Rfl: 3 .  pantoprazole (PROTONIX) 40 MG tablet, Take 40 mg by mouth daily., Disp: , Rfl:  .  primidone (MYSOLINE) 50 MG tablet, Take 50-100 mg by mouth See admin instructions. 50 mg daily(breakfast) , 100 mg at bedtime, Disp: , Rfl:  .  topiramate (TOPAMAX) 200 MG tablet, Take 300-400 mg by mouth See admin instructions. pt take 2 tabs (400 mg) q am, and 1.5 (300 mg) hs, Disp: , Rfl:  .  ziprasidone (GEODON) 60 MG capsule, Take 180 mg by mouth every evening. After dinner, Disp: , Rfl:  Allergies: Cleocin [clindamycin hcl]  Review of Systems  Constitutional: Negative for chills, fever and malaise/fatigue.  HENT: Negative for congestion, sinus pain and sore throat.   Eyes: Negative for blurred vision and pain.  Respiratory: Negative for cough and wheezing.   Cardiovascular: Negative for chest pain and leg swelling.  Gastrointestinal: Negative for abdominal pain, constipation, diarrhea, heartburn, nausea and vomiting.  Genitourinary: Negative for dysuria, frequency, hematuria and urgency.   Musculoskeletal: Negative for back pain, joint pain, myalgias and neck pain.  Skin: Negative for itching and rash.  Neurological: Positive for tingling and headaches. Negative for dizziness, tremors and weakness.  Endo/Heme/Allergies: Does not bruise/bleed easily.  Psychiatric/Behavioral: Positive for depression. The patient is nervous/anxious. The patient does not have insomnia.     Objective: BP 110/60   Ht 5\' 4"  (1.626 m)   Wt 163 lb (73.9 kg)   BMI 27.98 kg/m   Filed Weights   08/14/19 0800  Weight: 163 lb (73.9 kg)  Body mass index is 27.98 kg/m. Physical Exam Constitutional:      General: She is not in acute distress.    Appearance: She is well-developed.  Genitourinary:     Pelvic exam was performed with patient supine.     Vagina and rectum normal.     No lesions in the vagina.     No vaginal bleeding.     No right or left adnexal mass present.     Right adnexa not tender.     Left adnexa not tender.     Genitourinary Comments: Absent Uterus Absent cervix Vaginal cuff well healed, no vaginal defects noted Grade 1 cystocele  HENT:     Head: Normocephalic and atraumatic. No laceration.     Right Ear: Hearing normal.     Left Ear: Hearing normal.     Mouth/Throat:     Pharynx: Uvula midline.  Eyes:     Pupils: Pupils are equal, round, and reactive to light.  Neck:     Musculoskeletal: Normal range of motion and neck supple.     Thyroid: No thyromegaly.  Cardiovascular:     Rate and Rhythm: Normal rate and regular rhythm.     Heart sounds: No murmur. No friction rub. No gallop.   Pulmonary:     Effort: Pulmonary effort is normal. No respiratory distress.     Breath sounds: Normal breath sounds. No wheezing.  Chest:     Breasts:        Right: No mass, skin change or tenderness.        Left: No mass, skin change or tenderness.  Abdominal:     General: Bowel sounds are normal. There is no distension.     Palpations: Abdomen is soft.     Tenderness: There  is no abdominal tenderness. There is no rebound.  Musculoskeletal: Normal range of motion.  Neurological:     Mental Status: She is alert and oriented to person, place, and time.     Cranial Nerves: No cranial nerve deficit.  Skin:    General: Skin is warm and dry.  Psychiatric:        Judgment: Judgment normal.  Vitals signs reviewed.     Assessment: Annual Exam 1. Women's annual routine gynecological examination   2. Vaginal atrophy   3. Screen for colon cancer   4. Encounter for screening mammogram for malignant neoplasm of breast     Plan:            1.  Cervical Screening-  Pap smear schedule reviewed with patient, every 5 years sufficient s/p hyst  2. Breast screening- Exam annually and mammogram scheduled  3. Colonoscopy every 10 years, Hemoccult testing after age 5  4. Labs managed by PCP  5. Counseling for hormonal therapy: none              6. FRAX - FRAX score for assessing the 10 year probability for fracture calculated and discussed today.  Based on age and score today, DEXA is not currently scheduled.   7. Vaginal atrophy- cont Osphena.   8. GU sx's to see urologist soon    F/U  Return in about 1 year (around 08/13/2020) for Annual.  Barnett Applebaum, MD, [facility], [facility] 08/14/2019  8:25 AM

## 2019-08-21 ENCOUNTER — Encounter: Payer: Self-pay | Admitting: Obstetrics and Gynecology

## 2019-09-01 ENCOUNTER — Encounter: Payer: Self-pay | Admitting: Physician Assistant

## 2019-09-01 ENCOUNTER — Ambulatory Visit (INDEPENDENT_AMBULATORY_CARE_PROVIDER_SITE_OTHER): Payer: Medicare Other | Admitting: Physician Assistant

## 2019-09-01 ENCOUNTER — Other Ambulatory Visit: Payer: Self-pay

## 2019-09-01 VITALS — BP 114/71 | HR 110 | Temp 97.5°F | Ht 64.0 in | Wt 162.0 lb

## 2019-09-01 DIAGNOSIS — E119 Type 2 diabetes mellitus without complications: Secondary | ICD-10-CM | POA: Diagnosis not present

## 2019-09-01 DIAGNOSIS — D860 Sarcoidosis of lung: Secondary | ICD-10-CM

## 2019-09-01 DIAGNOSIS — F319 Bipolar disorder, unspecified: Secondary | ICD-10-CM

## 2019-09-01 DIAGNOSIS — E785 Hyperlipidemia, unspecified: Secondary | ICD-10-CM

## 2019-09-01 DIAGNOSIS — G47419 Narcolepsy without cataplexy: Secondary | ICD-10-CM

## 2019-09-01 DIAGNOSIS — I471 Supraventricular tachycardia, unspecified: Secondary | ICD-10-CM | POA: Insufficient documentation

## 2019-09-01 DIAGNOSIS — G43809 Other migraine, not intractable, without status migrainosus: Secondary | ICD-10-CM

## 2019-09-01 DIAGNOSIS — E039 Hypothyroidism, unspecified: Secondary | ICD-10-CM

## 2019-09-01 NOTE — Patient Instructions (Signed)
Diabetes Mellitus and Exercise Exercising regularly is important for your overall health, especially when you have diabetes (diabetes mellitus). Exercising is not only about losing weight. It has many other health benefits, such as increasing muscle strength and bone density and reducing body fat and stress. This leads to improved fitness, flexibility, and endurance, all of which result in better overall health. Exercise has additional benefits for people with diabetes, including:  Reducing appetite.  Helping to lower and control blood glucose.  Lowering blood pressure.  Helping to control amounts of fatty substances (lipids) in the blood, such as cholesterol and triglycerides.  Helping the body to respond better to insulin (improving insulin sensitivity).  Reducing how much insulin the body needs.  Decreasing the risk for heart disease by: ? Lowering cholesterol and triglyceride levels. ? Increasing the levels of good cholesterol. ? Lowering blood glucose levels. What is my activity plan? Your health care provider or certified diabetes educator can help you make a plan for the type and frequency of exercise (activity plan) that works for you. Make sure that you:  Do at least 150 minutes of moderate-intensity or vigorous-intensity exercise each week. This could be brisk walking, biking, or water aerobics. ? Do stretching and strength exercises, such as yoga or weightlifting, at least 2 times a week. ? Spread out your activity over at least 3 days of the week.  Get some form of physical activity every day. ? Do not go more than 2 days in a row without some kind of physical activity. ? Avoid being inactive for more than 30 minutes at a time. Take frequent breaks to walk or stretch.  Choose a type of exercise or activity that you enjoy, and set realistic goals.  Start slowly, and gradually increase the intensity of your exercise over time. What do I need to know about managing my  diabetes?   Check your blood glucose before and after exercising. ? If your blood glucose is 240 mg/dL (13.3 mmol/L) or higher before you exercise, check your urine for ketones. If you have ketones in your urine, do not exercise until your blood glucose returns to normal. ? If your blood glucose is 100 mg/dL (5.6 mmol/L) or lower, eat a snack containing 15-20 grams of carbohydrate. Check your blood glucose 15 minutes after the snack to make sure that your level is above 100 mg/dL (5.6 mmol/L) before you start your exercise.  Know the symptoms of low blood glucose (hypoglycemia) and how to treat it. Your risk for hypoglycemia increases during and after exercise. Common symptoms of hypoglycemia can include: ? Hunger. ? Anxiety. ? Sweating and feeling clammy. ? Confusion. ? Dizziness or feeling light-headed. ? Increased heart rate or palpitations. ? Blurry vision. ? Tingling or numbness around the mouth, lips, or tongue. ? Tremors or shakes. ? Irritability.  Keep a rapid-acting carbohydrate snack available before, during, and after exercise to help prevent or treat hypoglycemia.  Avoid injecting insulin into areas of the body that are going to be exercised. For example, avoid injecting insulin into: ? The arms, when playing tennis. ? The legs, when jogging.  Keep records of your exercise habits. Doing this can help you and your health care provider adjust your diabetes management plan as needed. Write down: ? Food that you eat before and after you exercise. ? Blood glucose levels before and after you exercise. ? The type and amount of exercise you have done. ? When your insulin is expected to peak, if you use   insulin. Avoid exercising at times when your insulin is peaking.  When you start a new exercise or activity, work with your health care provider to make sure the activity is safe for you, and to adjust your insulin, medicines, or food intake as needed.  Drink plenty of water while  you exercise to prevent dehydration or heat stroke. Drink enough fluid to keep your urine clear or pale yellow. Summary  Exercising regularly is important for your overall health, especially when you have diabetes (diabetes mellitus).  Exercising has many health benefits, such as increasing muscle strength and bone density and reducing body fat and stress.  Your health care provider or certified diabetes educator can help you make a plan for the type and frequency of exercise (activity plan) that works for you.  When you start a new exercise or activity, work with your health care provider to make sure the activity is safe for you, and to adjust your insulin, medicines, or food intake as needed. This information is not intended to replace advice given to you by your health care provider. Make sure you discuss any questions you have with your health care provider. Document Released: 12/26/2003 Document Revised: 04/29/2017 Document Reviewed: 03/16/2016 [employer] Patient Education  2020 [employer]  

## 2019-09-01 NOTE — Progress Notes (Signed)
Patient: ELLIANAH Bright, Female    DOB: August 09, 1966, 53 y.o.   MRN: [id number] Visit Date: 09/01/2019  Today's Provider: Trey Sailors, PA-C   Chief Complaint  Patient presents with  . New Patient (Initial Visit)   Subjective:    New Patient Appointment Jennifer Bright is a 53 y.o. female who presents today for new patient appointment. She feels fairly well due to back pain. She reports exercising none. She reports she is sleeping poorly. Presents today with husband Harvie Heck.   Lives in the house with husband and two dogs. Four children and seven grandchildren. Six boys and one girl. Currently disabled due to mental illness for 12 years. Previously saw Dr. Judithann Sheen at [facility].   Sarcoidosis: Found incidentally on imaging from kidney stone. She denies SOB or chest pain. She is followed for this by Dr. Meredeth Ide at [facility].   Vestibular Migraines: Topamax, lamotrigine 200 mg BID, methocarbamol 750 mg QID for headaches from Dr. Catalina Lunger at [facility] in [place]. 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   Bipolar Depression: sees Psychiatrist Dr. Evelene Croon in [place] - wellbutrin 300 mg, Xanax 1 mg TID PRN, cogentin 1 mg, primidone 50 mg (two tabs in the morning, one at lunch and one at night) - dr. Evelene Croon, geodon 180 mg every evenin after dinner, suicidal ideation. Has had 3-4 behavioral health stays, most recently at [facility].   Diabetes: history of diabetes, has most recently diet controlled. Has lost 100 lbs after sarcoidosis diagnosis intentionally, diabetic eye exam 07/28/2019 done by [facility].   PAP: Complete hysterectomy for endometriosis. Sees Dr. Tiburcio Pea at [facility].  Hypothyroidism: Takes 100 mcg daily of levothyroxine, 5 mcg cytomel BID. Last TSH July was normal.   Pneumonia vaccine: Never  CKD III: Followed by Dr. Wynelle Link at [facility] for this. Previous trial on enalapril but this caused profound hypotension.   Edema: Treated with Lasix 20 mg QD.   Lasix 20 mg ----------------------------------------------------------------- Last Pap:08/10/2018 Last mammogram:11/21/2018 Last colonoscopy:04/05/2017 Last eye:07/28/2019  Review of Systems  Constitutional: Negative.   HENT: Negative.   Eyes: Negative.   Respiratory: Negative.   Cardiovascular: Negative.   Gastrointestinal: Negative.   Endocrine: Negative.   Genitourinary: Positive for difficulty urinating.  Musculoskeletal: Positive for back pain.  Skin: Negative.   Allergic/Immunologic: Negative.   Neurological: Positive for dizziness, tremors, seizures, syncope, weakness, light-headedness and headaches.  Hematological: Bruises/bleeds easily.  Psychiatric/Behavioral: Positive for agitation, confusion and sleep disturbance. The patient is nervous/anxious.     Social History She  reports that she quit smoking about 5 years ago. Her smoking use included cigarettes. She has never used smokeless tobacco. She reports that she does not drink alcohol or use drugs. Social History   Socioeconomic History  . Marital status: Married    Spouse name: Not on file  . Number of children: Not on file  . Years of education: Not on file  . Highest education level: Not on file  Occupational History  . Occupation: disabled  Social Needs  . Financial resource strain: Not hard at all  . Food insecurity    Worry: Never true    Inability: Never true  . Transportation needs    Medical: No    Non-medical: No  Tobacco Use  . Smoking status: Former Smoker    Types: Cigarettes    Quit date: 2015  Years since quitting: 5.8  . Smokeless tobacco: Never Used  Substance and Sexual Activity  . Alcohol use: No  . Drug use: No  . Sexual activity: Yes  Lifestyle  . Physical activity    Days per week: 0 days    Minutes per session: 0 min  . Stress: Very much   Relationships  . Social Musician on phone: Never    Gets together: Never    Attends religious service: More than 4 times per year    Active member of club or organization: No    Attends meetings of clubs or organizations: Never    Relationship status: Married  Other Topics Concern  . Not on file  Social History Narrative  . Not on file    Patient Active Problem List   Diagnosis Date Noted  . Vaginal atrophy 07/12/2017  . Polypharmacy 08/14/2016  . 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 Hypercalcemia 07/24/2016  . Sarcoidosis 07/24/2016  . Hyperlipidemia, unspecified 11/15/2015  . Type 2 diabetes mellitus without complication, without long-term current use of insulin (HCC) 11/15/2015  . Anxiety disorder 07/29/2015  . Anxiety 05/12/2015  . Bipolar depression (HCC) 05/12/2015  . Essential (primary) hypertension 05/12/2015  . Benign essential tremor 05/12/2015  . Gastro-esophageal reflux disease without esophagitis 05/12/2015  . Cephalalgia 05/12/2015  . Hypersomnia with sleep apnea 05/12/2015  . Restless leg 05/12/2015  . Diabetes (HCC) 05/12/2015  . Hypothyroidism 05/12/2015  . Migraine variant 05/12/2015  . Bipolar disorder (HCC) 05/12/2015  . Type 2 diabetes mellitus without complications (HCC) 05/12/2015  . Chronic migraine without aura 09/03/2012  . 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 Syncope and collapse 03/17/2012  . Depression 03/17/2012  . Migraine 03/17/2012    Past Surgical History:  Procedure Laterality Date  . ABDOMINAL HYSTERECTOMY    . APPENDECTOMY    . BLADDER SURGERY     bladder tact  . CATARACT EXTRACTION W/PHACO Right 09/21/2018   Procedure: CATARACT EXTRACTION PHACO AND INTRAOCULAR LENS PLACEMENT (IOC);  Surgeon: Nevada Crane, MD;  Location: [facility] ORS;  Service: Ophthalmology;  Laterality: Right;  [place]  00:20 CDE 00.82 Fluid pack lot # [id number]  . CATARACT EXTRACTION W/PHACO Left 10/17/2018   Procedure: CATARACT  EXTRACTION PHACO AND INTRAOCULAR LENS PLACEMENT (IOC)  LEFT;  Surgeon: Nevada Crane, MD;  Location: [facility];  Service: Ophthalmology;  Laterality: Left;  diabetic - diet controlled  . CESAREAN SECTION    . CHOLECYSTECTOMY    . COLONOSCOPY WITH PROPOFOL N/A 04/05/2017   Procedure: COLONOSCOPY WITH PROPOFOL;  Surgeon: Christena Deem, MD;  Location: [facility] END[facility]COPY;  Service: Endoscopy;  Laterality: N/A;  . ESOPHAGOGASTRODUODENOSCOPY (EGD) WITH PROPOFOL N/A 08/28/2016   Procedure: ESOPHAGOGASTRODUODENOSCOPY (EGD) WITH PROPOFOL;  Surgeon: Christena Deem, MD;  Location: [facility] ENDOSCOPY;  Service: Endoscopy;  Laterality: N/A;  . HERNIA REPAIR    . INGUINAL HERNIA REPAIR     rt and left  . lexiscan cardiolite    . TILT TABLE STUDY N/A 04/01/2012   Procedure: TILT TABLE STUDY;  Surgeon: Duke Salvia, MD;  Location: [facility] CATH LAB;  Service: Cardiovascular;  Laterality: N/A;    Family History  Family Status  Relation Name Status  . Father  Deceased at age 37       metastatic stomach cancer  . Mother  Alive  . Brother  Alive  . Sister  Alive       x2  . Brother  (Not Specified)  .  Sister  (Not Specified)  . Ethlyn Daniels  (Not Specified)  . Ethlyn Daniels  (Not Specified)  . Cousin  (Not Specified)  . Cousin  (Not Specified)  . Neg Hx  (Not Specified)   Her family history includes Alcohol abuse in her brother; Breast cancer in her paternal aunt; Cancer in her cousin and cousin; Colon cancer in her father; Hyperlipidemia in her mother; Hypertension in her mother; Irritable bowel syndrome in her sister; Ovarian cancer in her cousin and paternal aunt; Stomach cancer in her father; Stroke in her mother.     Allergies  Allergen Reactions  . Cleocin [Clindamycin Hcl] Other (See Comments)    GI distress    Previous Medications   ALPRAZOLAM (XANAX) 1 MG TABLET    Take 1 mg by mouth 3 (three) times daily as needed for anxiety or sleep.   ARMODAFINIL 250 MG TABLET    Take 250 mg by  mouth daily.   ATORVASTATIN (LIPITOR) 40 MG TABLET    Take 40 mg by mouth at bedtime.    BENZTROPINE (COGENTIN) 1 MG TABLET    Take 1 mg by mouth 2 (two) times daily.    BUPROPION (WELLBUTRIN XL) 300 MG 24 HR TABLET    Take 300 mg by mouth daily.   DOCUSATE SODIUM (COLACE) 100 MG CAPSULE    Take 100 mg by mouth 2 (two) times daily.    FUROSEMIDE (LASIX) 20 MG TABLET    Take 20 mg by mouth daily.   LEVOTHYROXINE (SYNTHROID, LEVOTHROID) 100 MCG TABLET    Take 100 mcg by mouth daily before breakfast. Takes at bedtime   LIOTHYRONINE (CYTOMEL) 5 MCG TABLET    Take 5 mcg by mouth 2 (two) times daily.    METHOCARBAMOL (ROBAXIN) 750 MG TABLET    Take 750 mg by mouth as needed for muscle spasms.   NUTRITIONAL SUPPLEMENTS (MELATONIN PO)    Take 10 mg by mouth at bedtime.    OSPEMIFENE (OSPHENA) 60 MG TABS    Take 1 tablet by mouth daily.   PANTOPRAZOLE (PROTONIX) 40 MG TABLET    Take 40 mg by mouth daily.   PRIMIDONE (MYSOLINE) 50 MG TABLET    Take 50-100 mg by mouth See admin instructions. 50 mg daily(breakfast) , 100 mg at bedtime   TOPIRAMATE (TOPAMAX) 200 MG TABLET    Take 300-400 mg by mouth See admin instructions. pt take 2 tabs (400 mg) q am, and 1.5 (300 mg) hs   ZIPRASIDONE (GEODON) 60 MG CAPSULE    Take 180 mg by mouth every evening. After dinner    Patient Care Team: Paulene Floor as PCP - General (Physician Assistant)      Objective:   Vitals: BP 114/71 (BP Location: Left Arm, Patient Position: Sitting, Cuff Size: Normal)   Pulse (!) 110   Temp (!) 97.5 F (36.4 C) (Temporal)   Ht 5\' 4"  (1.626 m)   Wt 162 lb (73.5 kg)   BMI 27.81 kg/m    Physical Exam Constitutional:      Appearance: Normal appearance.  Cardiovascular:     Rate and Rhythm: Normal rate and regular rhythm.     Heart sounds: Normal heart sounds.  Pulmonary:     Effort: Pulmonary effort is normal.     Breath sounds: Normal breath sounds.  Abdominal:     General: Bowel sounds are normal.      Palpations: Abdomen is soft.  Skin:    General: Skin is warm  and dry.  Neurological:     Mental Status: She is alert and oriented to person, place, and time. Mental status is at baseline.  Psychiatric:        Mood and Affect: Mood normal.        Behavior: Behavior normal.      Depression Screen PHQ 2/9 Scores 09/01/2019  PHQ - 2 Score 6  PHQ- 9 Score 14  Some encounter information is confidential and restricted. Go to Review Flowsheets activity to see all data.      Assessment & Plan:     Routine Health Maintenance and Physical Exam  Exercise Activities and Dietary recommendations Goals   None     Immunization History  Administered Date(s) Administered  . Influenza,inj,Quad PF,6+ Mos 07/09/2018  . Influenza-Unspecified 08/13/2017, 07/09/2018, 08/09/2019  . Tdap 08/07/2011    Health Maintenance  Topic Date Due  . PNEUMOCOCCAL POLYSACCHARIDE VACCINE AGE 23-64 HIGH RISK  02/20/1969  . FOOT EXAM  02/20/1977  . OPHTHALMOLOGY EXAM  02/20/1977  . URINE MICROALBUMIN  02/20/1977  . HIV Screening  02/20/1982  . HEMOGLOBIN A1C  01/22/2017  . MAMMOGRAM  11/21/2020  . TETANUS/TDAP  08/06/2021  . PAP SMEAR-Modifier  08/10/2021  . COLONOSCOPY  04/06/2027  . INFLUENZA VACCINE  Completed     Discussed health benefits of physical activity, and encouraged her to engage in regular exercise appropriate for her age and condition.    1. Paroxysmal supraventricular tachycardia (HCC)   2. Sarcoidosis of lung (HCC)  Followed by pulmonology.  3. Type 2 diabetes mellitus without complication, without long-term current use of insulin (HCC)  Will see her Q4 months for diabetes.   - HgB A1c - Urine Microalbumin w/creat. ratio  4. Bipolar depression (HCC)  Followed by Dr. Evelene CroonKaur.   5. Hyperlipidemia, unspecified hyperlipidemia type  Continue Lipitor.  6. Hypothyroidism, unspecified type  Continue synthroid and cytomel though unclear why she is on both.   7. Migraine  variant  [facility]   8. Narcolepsy without cataplexy  The entirety of the information documented in the History of Present Illness, Review of Systems and Physical Exam were personally obtained by me. Portions of this information were initially documented by [facility], CMA and reviewed by me for thoroughness and accuracy.       --------------------------------------------------------------------

## 2019-09-06 ENCOUNTER — Ambulatory Visit: Payer: BLUE CROSS/BLUE SHIELD | Admitting: Urology

## 2019-09-08 ENCOUNTER — Ambulatory Visit: Payer: Medicare Other | Admitting: Physician Assistant

## 2019-09-18 ENCOUNTER — Emergency Department (EMERGENCY_DEPARTMENT_HOSPITAL)
Admission: EM | Admit: 2019-09-18 | Discharge: 2019-09-19 | Disposition: A | Discharge status: Other Acute Inpatient Hospital | Payer: Medicare Other | Source: Home / Self Care | Attending: Emergency Medicine | Admitting: Emergency Medicine

## 2019-09-18 ENCOUNTER — Other Ambulatory Visit: Payer: Self-pay

## 2019-09-18 DIAGNOSIS — F3113 Bipolar disorder, current episode manic without psychotic features, severe: Secondary | ICD-10-CM | POA: Diagnosis not present

## 2019-09-18 DIAGNOSIS — Z23 Encounter for immunization: Secondary | ICD-10-CM | POA: Insufficient documentation

## 2019-09-18 DIAGNOSIS — Z87891 Personal history of nicotine dependence: Secondary | ICD-10-CM | POA: Insufficient documentation

## 2019-09-18 DIAGNOSIS — N952 Postmenopausal atrophic vaginitis: Secondary | ICD-10-CM | POA: Diagnosis present

## 2019-09-18 DIAGNOSIS — F319 Bipolar disorder, unspecified: Secondary | ICD-10-CM | POA: Diagnosis present

## 2019-09-18 DIAGNOSIS — Z20828 Contact with and (suspected) exposure to other viral communicable diseases: Secondary | ICD-10-CM | POA: Insufficient documentation

## 2019-09-18 DIAGNOSIS — F329 Major depressive disorder, single episode, unspecified: Secondary | ICD-10-CM | POA: Diagnosis present

## 2019-09-18 DIAGNOSIS — G250 Essential tremor: Secondary | ICD-10-CM | POA: Diagnosis present

## 2019-09-18 DIAGNOSIS — F3181 Bipolar II disorder: Secondary | ICD-10-CM | POA: Diagnosis not present

## 2019-09-18 DIAGNOSIS — E039 Hypothyroidism, unspecified: Secondary | ICD-10-CM | POA: Insufficient documentation

## 2019-09-18 DIAGNOSIS — G43709 Chronic migraine without aura, not intractable, without status migrainosus: Secondary | ICD-10-CM | POA: Diagnosis present

## 2019-09-18 DIAGNOSIS — R45851 Suicidal ideations: Secondary | ICD-10-CM

## 2019-09-18 DIAGNOSIS — N183 Chronic kidney disease, stage 3 unspecified: Secondary | ICD-10-CM | POA: Insufficient documentation

## 2019-09-18 DIAGNOSIS — F419 Anxiety disorder, unspecified: Secondary | ICD-10-CM | POA: Diagnosis present

## 2019-09-18 DIAGNOSIS — E1122 Type 2 diabetes mellitus with diabetic chronic kidney disease: Secondary | ICD-10-CM | POA: Insufficient documentation

## 2019-09-18 DIAGNOSIS — Z79899 Other long term (current) drug therapy: Secondary | ICD-10-CM | POA: Insufficient documentation

## 2019-09-18 DIAGNOSIS — K219 Gastro-esophageal reflux disease without esophagitis: Secondary | ICD-10-CM | POA: Diagnosis present

## 2019-09-18 DIAGNOSIS — I10 Essential (primary) hypertension: Secondary | ICD-10-CM | POA: Diagnosis present

## 2019-09-18 DIAGNOSIS — K2100 Gastro-esophageal reflux disease with esophagitis, without bleeding: Secondary | ICD-10-CM | POA: Diagnosis present

## 2019-09-18 DIAGNOSIS — E119 Type 2 diabetes mellitus without complications: Secondary | ICD-10-CM

## 2019-09-18 LAB — CBC WITH DIFFERENTIAL/PLATELET
Abs Immature Granulocytes: 0.03 10*3/uL (ref 0.00–0.07)
Basophils Absolute: 0.1 10*3/uL (ref 0.0–0.1)
Basophils Relative: 2 %
Eosinophils Absolute: 0.7 10*3/uL — ABNORMAL HIGH (ref 0.0–0.5)
Eosinophils Relative: 10 %
HCT: 42.7 % (ref 36.0–46.0)
Hemoglobin: 13.9 g/dL (ref 12.0–15.0)
Immature Granulocytes: 0 %
Lymphocytes Relative: 31 %
Lymphs Abs: 2.1 10*3/uL (ref 0.7–4.0)
MCH: 30 pg (ref 26.0–34.0)
MCHC: 32.6 g/dL (ref 30.0–36.0)
MCV: 92.2 fL (ref 80.0–100.0)
Monocytes Absolute: 0.4 10*3/uL (ref 0.1–1.0)
Monocytes Relative: 6 %
Neutro Abs: 3.5 10*3/uL (ref 1.7–7.7)
Neutrophils Relative %: 51 %
Platelets: 214 10*3/uL (ref 150–400)
RBC: 4.63 MIL/uL (ref 3.87–5.11)
RDW: 12.7 % (ref 11.5–15.5)
WBC: 6.8 10*3/uL (ref 4.0–10.5)
nRBC: 0 % (ref 0.0–0.2)

## 2019-09-18 LAB — BASIC METABOLIC PANEL
Anion gap: 11 (ref 5–15)
BUN: 17 mg/dL (ref 6–20)
CO2: 21 mmol/L — ABNORMAL LOW (ref 22–32)
Calcium: 9.6 mg/dL (ref 8.9–10.3)
Chloride: 108 mmol/L (ref 98–111)
Creatinine, Ser: 1.2 mg/dL — ABNORMAL HIGH (ref 0.44–1.00)
GFR calc Af Amer: >60 mL/min (ref >60)
GFR calc non Af Amer: 52 mL/min — ABNORMAL LOW (ref >60)
Glucose, Bld: 117 mg/dL — ABNORMAL HIGH (ref 70–99)
Potassium: 3.8 mmol/L (ref 3.5–5.1)
Sodium: 140 mmol/L (ref 135–145)

## 2019-09-18 LAB — ETHANOL: Alcohol, Ethyl (B): <10 mg/dL (ref <10)

## 2019-09-18 LAB — ACETAMINOPHEN LEVEL: Acetaminophen (Tylenol), Serum: <10 ug/mL — ABNORMAL LOW (ref 10–30)

## 2019-09-18 LAB — SALICYLATE LEVEL: Salicylate Lvl: <7 mg/dL (ref 2.8–30.0)

## 2019-09-18 MED ORDER — TETANUS-DIPHTH-ACELL PERTUSSIS 5-2.5-18.5 LF-MCG/0.5 IM SUSP
0.5000 mL | Freq: Once | INTRAMUSCULAR | Status: AC
  Administered 2019-09-18: 0.5 mL via INTRAMUSCULAR
  Filled 2019-09-18: qty 0.5

## 2019-09-18 NOTE — ED Notes (Signed)
Patient sent back to ER due to being unsteady with ambulation without tremors and bogy legs.

## 2019-09-18 NOTE — Consult Note (Signed)
Jennifer Bright   Reason for Bright: Suicidal Referring Physician: Dr. Charna Archer Patient Identification: Jennifer Bright MRN:  [id number] Principal Diagnosis: <principal problem not specified> Diagnosis:  Active Problems:   Depression   Anxiety   Bipolar depression ([place])   Chronic migraine without aura   Essential (primary) hypertension   Benign essential tremor   Gastro-esophageal reflux disease without esophagitis   Cephalalgia   Diabetes ([place])   Anxiety disorder   Bipolar disorder ([place])   Polypharmacy   Vaginal atrophy   Total Time spent with patient: 1 hour  Subjective: "I got into an argument with my daughter.  My daughter have a habit of making me very angry." Jennifer Bright is a 53 y.o. female patient presented to [name] ED via EMS/with law enforcement at patient's side and under involuntary commitment status (IVC). Per the ED triage nursing note, the patient was brought to the ER from home with suicidal ideation and attempt. The nursing note explained that the patient came in with multiple lacerations to her bilateral wrist and questionable throat laceration.  The patient was last hospitalized at [place] in July 2020. The patient was seen face-to-face by this provider; chart reviewed and consulted with Dr. Charna Archer on 09/18/2019 due to the patient's care. It was discussed with the EDP that the patient does meet criteria to be admitted to the psychiatric inpatient unit.  The patient explained that she became upset with her daughter and decided to cut herself to let her daughter know that she was no longer dealing with her. The patient presents to have a cut on her left wrist in the shape of a cross. The patient also has a superficial cut mark on her right wrist and a superficial cut mark on her throat area.  During the patient interview, she presents to be manic. She voiced, "I am okay to go home."  "I was not trying to harm myself." The patient does not  present to be truthful in most of her answers.  She presents to give many different answers to different people. The patient is alert and oriented x4, calmer, but easily aroused, cooperative, and mood-congruent with affect on evaluation. The patient does not appear to be responding to internal or external stimuli. The patient is  presenting with some delusions. The patient admits to auditory hallucinations but denies visual hallucinations. The patient denies suicidal, homicidal, or self-harm ideations currently. The patient is not presenting with any psychotic or paranoid behaviors.  Collateral was obtained by the patient's husband, Oddie Kuhlmann ([phone]), who expresses concerns for the patient's behaviors.  He discussed to this provider getting help for his wife on an outpatient basis with a therapist who can help prevent her from having a reaction that she did today (11.30.20).  He voiced that he hoped she would have responded differently than what she did. He discussed, "our daughter berated my wife.  My wife voiced she was going to step back to avoid this situation from happening again.  He expressed that his daughter kept calling her mother and harassing her about things in the past.   He expressed, "this year we avoided thanksgiving just not to have her berate my wife." Plan: The patient is a safety risk to self and does require psychiatric inpatient admission for stabilization and treatment. HPI: Per Dr. Charna Archer; Jennifer Bright is a 53 y.o. female with past medical history of bipolar disorder, chronic kidney disease, sarcoidosis, and diabetes presents to the ED complaining of  suicidal ideation.  Patient reports that she was having thoughts of harming herself throughout the day today and eventually acted on these, cutting herself at both wrists and her neck.  She states she no longer feels like she wants to harm herself and would like to go home.  She denies any specific stressors and has not  had any thoughts of harming others.  She also denies any auditory or visual hallucinations.  She has otherwise been feeling well and denies any fevers, chills, cough, chest pain, or shortness of breath.  Patient brought to the ED in police custody under IVC.    Past Psychiatric History:  Anxiety Bipolar disorder (HCC) Depression Restless leg syndrome Risk to Self: Suicidal Ideation: No Suicidal Plan?: No-Not Currently/Within Last 6 Months Access to Means: Yes Specify Access to Suicidal Means: Would not specify What has been your use of drugs/alcohol within the last 12 months?: denied use How many times?: 3 Other Self Harm Risks: cutting Triggers for Past Attempts: Other (Comment)(Family stress) Intentional Self Injurious Behavior: Cutting Risk to Others: Homicidal Ideation: No Thoughts of Harm to Others: No Current Homicidal Intent: No Current Homicidal Plan: No Access to Homicidal Means: No Identified Victim: None identified History of harm to others?: No Assessment of Violence: None Noted Does patient have access to weapons?: No Criminal Charges Pending?: No Does patient have a court date: No Prior Inpatient Therapy: Prior Inpatient Therapy: Yes Prior Therapy Dates: July 2020 Prior Therapy Facilty/Provider(s): [facility] Reason for Treatment: Depression, SI Prior Outpatient Therapy: Prior Outpatient Therapy: Yes Prior Therapy Dates: Current Prior Therapy Facilty/Provider(s): Dr. Westley Chandler Reason for Treatment: Depression, Schizophrenia Does patient have an ACCT team?: No Does patient have Intensive In-House Services?  : No Does patient have [facility] services? : No Does patient have P4CC services?: No  Past Medical History:  Past Medical History:  Diagnosis Date  . Acid reflux   . Anemia   . Anxiety   . Bipolar disorder (HCC)   . Cataract   . Chronic kidney disease    STAGE 3  . Depression   . Diabetes mellitus    NIDD  . Essential tremor   . Family history of breast  cancer    doesn't meet medicare guidelines for cancer genetic testing.   . Family history of ovarian cancer    Pt doesn't meet Medicare genetic testing guidelines  . Hyperlipidemia   . Hypothyroid   . IBS (irritable bowel syndrome)   . Migraine headache    vestibular migraine  . Obesity   . Osteopenia   . Restless leg syndrome   . Sarcoidosis   . Sleep apnea   . Syncope \    Past Surgical History:  Procedure Laterality Date  . ABDOMINAL HYSTERECTOMY    . APPENDECTOMY    . BLADDER SURGERY     bladder tact  . CATARACT EXTRACTION W/PHACO Right 09/21/2018   Procedure: CATARACT EXTRACTION PHACO AND INTRAOCULAR LENS PLACEMENT (IOC);  Surgeon: Nevada Crane, MD;  Location: [facility] ORS;  Service: Ophthalmology;  Laterality: Right;  [place]  00:20 CDE 00.82 Fluid pack lot # [id number]  . CATARACT EXTRACTION W/PHACO Left 10/17/2018   Procedure: CATARACT EXTRACTION PHACO AND INTRAOCULAR LENS PLACEMENT (IOC)  LEFT;  Surgeon: Nevada Crane, MD;  Location: [facility];  Service: Ophthalmology;  Laterality: Left;  diabetic - diet controlled  . CESAREAN SECTION    . CHOLECYSTECTOMY    . COLONOSCOPY WITH PROPOFOL N/A 04/05/2017   Procedure: COLONOSCOPY WITH PROPOFOL;  Surgeon: Christena Deem, MD;  Location: [facility] ENDOSCOPY;  Service: Endoscopy;  Laterality: N/A;  . ESOPHAGOGASTRODUODENOSCOPY (EGD) WITH PROPOFOL N/A 08/28/2016   Procedure: ESOPHAGOGASTRODUODENOSCOPY (EGD) WITH PROPOFOL;  Surgeon: Christena Deem, MD;  Location: [facility] ENDOSCOPY;  Service: Endoscopy;  Laterality: N/A;  . HERNIA REPAIR    . INGUINAL HERNIA REPAIR     rt and left  . lexiscan cardiolite    . TILT TABLE STUDY N/A 04/01/2012   Procedure: TILT TABLE STUDY;  Surgeon: Duke Salvia, MD;  Location: [facility] CATH LAB;  Service: Cardiovascular;  Laterality: N/A;   Family History:  Family History  Problem Relation Age of Onset  . Colon cancer Father   . Stomach cancer Father   . Hypertension Mother   .  Hyperlipidemia Mother   . Stroke Mother   . Alcohol abuse Brother   . Irritable bowel syndrome Sister   . Breast cancer Paternal Aunt   . Ovarian cancer Paternal Aunt   . Cancer Cousin   . Cancer Cousin   . Ovarian cancer Cousin   . Kidney cancer Neg Hx   . Kidney disease Neg Hx   . Prostate cancer Neg Hx    Family Psychiatric  History: Mom bipolar and sisters Social History:  Social History   Substance and Sexual Activity  Alcohol Use No     Social History   Substance and Sexual Activity  Drug Use No    Social History   Socioeconomic History  . Marital status: Married    Spouse name: Not on file  . Number of children: Not on file  . Years of education: Not on file  . Highest education level: Not on file  Occupational History  . Occupation: disabled  Social Needs  . Financial resource strain: Not hard at all  . Food insecurity    Worry: Never true    Inability: Never true  . Transportation needs    Medical: No    Non-medical: No  Tobacco Use  . Smoking status: Former Smoker    Types: Cigarettes    Quit date: 2015    Years since quitting: 5.9  . Smokeless tobacco: Never Used  Substance and Sexual Activity  . Alcohol use: No  . Drug use: No  . Sexual activity: Yes  Lifestyle  . Physical activity    Days per week: 0 days    Minutes per session: 0 min  . Stress: Very much  Relationships  . Social Musician on phone: Never    Gets together: Never    Attends religious service: More than 4 times per year    Active member of club or organization: No    Attends meetings of clubs or organizations: Never    Relationship status: Married  Other Topics Concern  . Not on file  Social History Narrative  . Not on file   Additional Social History:    Allergies:   Allergies  Allergen Reactions  . Cleocin [Clindamycin Hcl] Other (See Comments)    GI distress    Labs:  Results for orders placed or performed during the hospital encounter of  09/18/19 (from the past 48 hour(s))  Basic metabolic panel     Status: Abnormal   Collection Time: 09/18/19  4:58 PM  Result Value Ref Range   Sodium 140 135 - 145 mmol/L   Potassium 3.8 3.5 - 5.1 mmol/L   Chloride 108 98 - 111 mmol/L   CO2 21 (L) 22 -  32 mmol/L   Glucose, Bld 117 (H) 70 - 99 mg/dL   BUN 17 6 - 20 mg/dL   Creatinine, Ser 4.091.20 (H) 0.44 - 1.00 mg/dL   Calcium 9.6 8.9 - 81.110.3 mg/dL   GFR calc non Af Amer 52 (L) >60 mL/min   GFR calc Af Amer >60 >60 mL/min   Anion gap 11 5 - 15    Comment: Performed at [facility], [address] [place], [place] [address]  CBC with Differential     Status: Abnormal   Collection Time: 09/18/19  4:58 PM  Result Value Ref Range   WBC 6.8 4.0 - 10.5 K/uL   RBC 4.63 3.87 - 5.11 MIL/uL   Hemoglobin 13.9 12.0 - 15.0 g/dL   HCT 29.542.7 62.136.0 - 30.846.0 %   MCV 92.2 80.0 - 100.0 fL   MCH 30.0 26.0 - 34.0 pg   MCHC 32.6 30.0 - 36.0 g/dL   RDW 65.712.7 84.611.5 - 96.215.5 %   Platelets 214 150 - 400 K/uL   nRBC 0.0 0.0 - 0.2 %   Neutrophils Relative % 51 %   Neutro Abs 3.5 1.7 - 7.7 K/uL   Lymphocytes Relative 31 %   Lymphs Abs 2.1 0.7 - 4.0 K/uL   Monocytes Relative 6 %   Monocytes Absolute 0.4 0.1 - 1.0 K/uL   Eosinophils Relative 10 %   Eosinophils Absolute 0.7 (H) 0.0 - 0.5 K/uL   Basophils Relative 2 %   Basophils Absolute 0.1 0.0 - 0.1 K/uL   Immature Granulocytes 0 %   Abs Immature Granulocytes 0.03 0.00 - 0.07 K/uL    Comment: Performed at [facility], [address] [place], [place] [address]  Ethanol     Status: None   Collection Time: 09/18/19  4:58 PM  Result Value Ref Range   Alcohol, Ethyl (B) <10 <10 mg/dL    Comment: (NOTE) Lowest detectable limit for serum alcohol is 10 mg/dL. For medical purposes only. Performed at [facility], [address] [place], [place] [address]   Acetaminophen level     Status: Abnormal   Collection Time: 09/18/19  4:58 PM  Result Value Ref Range   Acetaminophen  (Tylenol), Serum <10 (L) 10 - 30 ug/mL    Comment: (NOTE) Therapeutic concentrations vary significantly. A range of 10-30 ug/mL  may be an effective concentration for many patients. However, some  are best treated at concentrations outside of this range. Acetaminophen concentrations >150 ug/mL at 4 hours after ingestion  and >50 ug/mL at 12 hours after ingestion are often associated with  toxic reactions. Performed at [facility], [address] [place], [place] [address]   Salicylate level     Status: None   Collection Time: 09/18/19  4:58 PM  Result Value Ref Range   Salicylate Lvl <7.0 2.8 - 30.0 mg/dL    Comment: Performed at [facility], [address] [place], [place] [address]    No current facility-administered medications for this encounter.    Current Outpatient Medications  Medication Sig Dispense Refill  . ALPRAZolam (XANAX) 1 MG tablet Take 1 mg by mouth 3 (three) times daily as needed for anxiety or sleep.    . Armodafinil 250 MG tablet Take 250 mg by mouth daily.    Marland Kitchen. atorvastatin (LIPITOR) 40 MG tablet Take 40 mg by mouth at bedtime.     . benztropine (COGENTIN) 1 MG tablet Take 1 mg by mouth 2 (two) times daily.     .Marland Kitchen  buPROPion (WELLBUTRIN XL) 300 MG 24 hr tablet Take 300 mg by mouth daily.    Marland Kitchen docusate sodium (COLACE) 100 MG capsule Take 100 mg by mouth 2 (two) times daily.     . furosemide (LASIX) 20 MG tablet Take 20 mg by mouth daily.    Marland Kitchen levothyroxine (SYNTHROID, LEVOTHROID) 100 MCG tablet Take 100 mcg by mouth daily before breakfast. Takes at bedtime    . liothyronine (CYTOMEL) 5 MCG tablet Take 5 mcg by mouth 2 (two) times daily.     . methocarbamol (ROBAXIN) 750 MG tablet Take 750 mg by mouth as needed for muscle spasms.    . Nutritional Supplements (MELATONIN PO) Take 10 mg by mouth at bedtime.     . Ospemifene (OSPHENA) 60 MG TABS Take 1 tablet by mouth daily. 90 tablet 3  . pantoprazole (PROTONIX) 40 MG tablet Take 40 mg by mouth  daily.    . primidone (MYSOLINE) 50 MG tablet Take 50-100 mg by mouth See admin instructions. 50 mg daily(breakfast) , 100 mg at bedtime    . topiramate (TOPAMAX) 200 MG tablet Take 300-400 mg by mouth See admin instructions. pt take 2 tabs (400 mg) q am, and 1.5 (300 mg) hs    . ziprasidone (GEODON) 60 MG capsule Take 180 mg by mouth every evening. After dinner      Musculoskeletal: Strength & Muscle Tone: decreased Gait & Station: unsteady Patient leans: N/A  Psychiatric Specialty Exam: Physical Exam  Nursing note and vitals reviewed. Constitutional: She is oriented to person, place, and time. She appears well-developed.  Neck: Normal range of motion. Neck supple.  Cardiovascular: Normal rate.  Respiratory: Effort normal.  Musculoskeletal: Normal range of motion.  Neurological: She is alert and oriented to person, place, and time.    Review of Systems  Neurological: Positive for dizziness and weakness.  Psychiatric/Behavioral: Positive for depression and hallucinations. The patient is nervous/anxious and has insomnia.   All other systems reviewed and are negative.   Blood pressure 114/71, temperature 99 F (37.2 C), temperature source Oral, resp. rate 18, height 5\' 4"  (1.626 m), weight 72.6 kg, SpO2 99 %.Body mass index is 27.46 kg/m.  General Appearance: Bizarre and Casual  Eye Contact:  Good  Speech:  Pressured  Volume:  Increased  Mood:  Anxious  Affect:  Congruent, Inappropriate and Full Range  Thought Process:  Coherent  Orientation:  Full (Time, Place, and Person)  Thought Content:  Illogical  Suicidal Thoughts:  The patient denies suicidal ideation but has a cut in the form of a cross left wrist in which she used a box cutter.  Homicidal Thoughts:  No  Memory:  Immediate;   Good Recent;   Good Remote;   Good  Judgement:  Impaired  Insight:  Lacking  Psychomotor Activity:  Increased  Concentration:  Concentration: Good and Attention Span: Good  Recall:  Good   Fund of Knowledge:  Fair  Language:  Good  Akathisia:  Negative  Handed:  Right  AIMS (if indicated):     Assets:  Desire for Improvement Intimacy Leisure Time Resilience Social Support  ADL's:  Intact  Cognition:  WNL  Sleep:   Insomnia     Treatment Plan Summary: Daily contact with patient to assess and evaluate symptoms and progress in treatment, Medication management and Plan Patient meets criteria for psychiatric inpatient admission.  Disposition: Recommend psychiatric Inpatient admission when medically cleared. Supportive therapy provided about ongoing stressors.  , NP 09/18/2019 11:44 PM

## 2019-09-18 NOTE — ED Notes (Signed)
Patient back to her room, s/o other to go home. Patient to be admitted and re-evaluated in the morning.

## 2019-09-18 NOTE — ED Notes (Signed)
IVC prior to arrival/ [facility] ordered/called

## 2019-09-18 NOTE — ED Notes (Signed)
SOC attempted, psych unable to evaluate patient. Patient refused interview stated will not answer any questions until her husband is at her bedside to assist with questioning.

## 2019-09-18 NOTE — ED Notes (Signed)
TTS requested TTS machine be set up to conduct assessment

## 2019-09-18 NOTE — ED Provider Notes (Signed)
[facility] Emergency Department Provider Note   ____________________________________________   First MD Initiated Contact with Patient 09/18/19 1655     (approximate)  I have reviewed the triage vital signs and the nursing notes.   HISTORY  Chief Complaint Suicidal    HPI Jennifer Bright is a 53 y.o. female with past medical history of bipolar disorder, chronic kidney disease, sarcoidosis, and diabetes presents to the ED complaining of suicidal ideation.  Patient reports that she was having thoughts of harming herself throughout the day today and eventually acted on these, cutting herself at both wrists and her neck.  She states she no longer feels like she wants to harm herself and would like to go home.  She denies any specific stressors and has not had any thoughts of harming others.  She also denies any auditory or visual hallucinations.  She has otherwise been feeling well and denies any fevers, chills, cough, chest pain, or shortness of breath.  Patient brought to the ED in police custody under IVC.        Past Medical History:  Diagnosis Date  . Acid reflux   . Anemia   . Anxiety   . Bipolar disorder (HCC)   . Cataract   . Chronic kidney disease    STAGE 3  . Depression   . Diabetes mellitus    NIDD  . Essential tremor   . Family history of breast cancer    doesn't meet medicare guidelines for cancer genetic testing.   . Family history of ovarian cancer    Pt doesn't meet Medicare genetic testing guidelines  . Hyperlipidemia   . Hypothyroid   . IBS (irritable bowel syndrome)   . Migraine headache    vestibular migraine  . Obesity   . Osteopenia   . Restless leg syndrome   . Sarcoidosis   . Sleep apnea   . Syncope \    Patient Active Problem List   Diagnosis Date Noted  . Paroxysmal supraventricular tachycardia (HCC) 09/01/2019  . Vaginal atrophy 07/12/2017  . Polypharmacy 08/14/2016  . 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   . Hypercalcemia 07/24/2016  . Sarcoidosis 07/24/2016  . Hyperlipidemia, unspecified 11/15/2015  . Type 2 diabetes mellitus without complication, without long-term current use of insulin (HCC) 11/15/2015  . Anxiety disorder 07/29/2015  . Anxiety 05/12/2015  . Bipolar depression (HCC) 05/12/2015  . Essential (primary) hypertension 05/12/2015  . Benign essential tremor 05/12/2015  . Gastro-esophageal reflux disease without esophagitis 05/12/2015  . Cephalalgia 05/12/2015  . Hypersomnia with sleep apnea 05/12/2015  . Restless leg 05/12/2015  . Diabetes (HCC) 05/12/2015  . Hypothyroidism 05/12/2015  . Migraine variant 05/12/2015  . Bipolar disorder (HCC) 05/12/2015  . Type 2 diabetes mellitus without complications (HCC) 05/12/2015  . Chronic migraine without aura 09/03/2012  . 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 Syncope and collapse 03/17/2012  . Depression 03/17/2012  . Migraine 03/17/2012    Past Surgical History:  Procedure Laterality Date  . ABDOMINAL HYSTERECTOMY    . APPENDECTOMY    . BLADDER SURGERY     bladder tact  . CATARACT EXTRACTION W/PHACO Right 09/21/2018   Procedure: CATARACT EXTRACTION PHACO AND INTRAOCULAR LENS PLACEMENT (IOC);  Surgeon: Nevada Crane, MD;  Location: [facility] ORS;  Service: Ophthalmology;  Laterality: Right;  [place]  00:20 CDE 00.82 Fluid pack lot # [id number]  . CATARACT EXTRACTION W/PHACO Left 10/17/2018   Procedure: CATARACT EXTRACTION PHACO AND INTRAOCULAR LENS PLACEMENT (IOC)  LEFT;  Surgeon: Nevada Crane, MD;  Location: [facility] SURGERY [facility];  Service: Ophthalmology;  Laterality: Left;  diabetic - diet controlled  . CESAREAN SECTION    . CHOLECYSTECTOMY    . COLONOSCOPY WITH PROPOFOL N/A 04/05/2017   Procedure: COLONOSCOPY WITH PROPOFOL;  Surgeon: Christena Deem, MD;  Location: [facility] ENDOSCOPY;  Service: Endoscopy;  Laterality: N/A;  . ESOPHAGOGASTRODUODENOSCOPY (EGD) WITH PROPOFOL N/A 08/28/2016   Procedure:  ESOPHAGOGASTRODUODENOSCOPY (EGD) WITH PROPOFOL;  Surgeon: Christena Deem, MD;  Location: [facility] ENDOSCOPY;  Service: Endoscopy;  Laterality: N/A;  . HERNIA REPAIR    . INGUINAL HERNIA REPAIR     rt and left  . lexiscan cardiolite    . TILT TABLE STUDY N/A 04/01/2012   Procedure: TILT TABLE STUDY;  Surgeon: Duke Salvia, MD;  Location: [facility] CATH LAB;  Service: Cardiovascular;  Laterality: N/A;    Prior to Admission medications   Medication Sig Start Date End Date Taking? Authorizing Provider  ALPRAZolam Prudy Feeler) 1 MG tablet Take 1 mg by mouth 3 (three) times daily as needed for anxiety or sleep. 05/09/19   [provider]  Armodafinil 250 MG tablet Take 250 mg by mouth daily.    [provider]  atorvastatin (LIPITOR) 40 MG tablet Take 40 mg by mouth at bedtime.     [provider]  benztropine (COGENTIN) 1 MG tablet Take 1 mg by mouth 2 (two) times daily.     [provider]  buPROPion (WELLBUTRIN XL) 300 MG 24 hr tablet Take 300 mg by mouth daily.    [provider]  docusate sodium (COLACE) 100 MG capsule Take 100 mg by mouth 2 (two) times daily.     [provider]  furosemide (LASIX) 20 MG tablet Take 20 mg by mouth daily.    [provider]  levothyroxine (SYNTHROID, LEVOTHROID) 100 MCG tablet Take 100 mcg by mouth daily before breakfast. Takes at bedtime    [provider]  liothyronine (CYTOMEL) 5 MCG tablet Take 5 mcg by mouth 2 (two) times daily.  05/11/16   [provider]  methocarbamol (ROBAXIN) 750 MG tablet Take 750 mg by mouth as needed for muscle spasms.    [provider]  Nutritional Supplements (MELATONIN PO) Take 10 mg by mouth at bedtime.     [provider]  Ospemifene (OSPHENA) 60 MG TABS Take 1 tablet by mouth daily. 08/14/19   Nadara Mustard, MD  pantoprazole (PROTONIX) 40 MG tablet Take 40 mg by mouth daily.    [provider]  primidone (MYSOLINE) 50 MG tablet  Take 50-100 mg by mouth See admin instructions. 50 mg daily(breakfast) , 100 mg at bedtime    [provider]  topiramate (TOPAMAX) 200 MG tablet Take 300-400 mg by mouth See admin instructions. pt take 2 tabs (400 mg) q am, and 1.5 (300 mg) hs    [provider]  ziprasidone (GEODON) 60 MG capsule Take 180 mg by mouth every evening. After dinner 04/06/19   [provider]    Allergies Cleocin [clindamycin hcl]  Family History  Problem Relation Age of Onset  . Colon cancer Father   . Stomach cancer Father   . Hypertension Mother   . Hyperlipidemia Mother   . Stroke Mother   . Alcohol abuse Brother   . Irritable bowel syndrome Sister   . Breast cancer Paternal Aunt   . Ovarian cancer Paternal Aunt   . Cancer Cousin   . Cancer Cousin   .  Ovarian cancer Cousin   . Kidney cancer Neg Hx   . Kidney disease Neg Hx   . Prostate cancer Neg Hx     Social History Social History   Tobacco Use  . Smoking status: Former Smoker    Types: Cigarettes    Quit date: 2015    Years since quitting: 5.9  . Smokeless tobacco: Never Used  Substance Use Topics  . Alcohol use: No  . Drug use: No    Review of Systems  Constitutional: No fever/chills Eyes: No visual changes. ENT: No sore throat. Cardiovascular: Denies chest pain. Respiratory: Denies shortness of breath. Gastrointestinal: No abdominal pain.  No nausea, no vomiting.  No diarrhea.  No constipation. Genitourinary: Negative for dysuria. Musculoskeletal: Negative for back pain. Skin: Negative for rash.  Positive for laceration. Neurological: Negative for headaches, focal weakness or numbness.  ____________________________________________   PHYSICAL EXAM:  VITAL SIGNS: ED Triage Vitals  Enc Vitals Group     BP 09/18/19 1703 118/76     Pulse --      Resp 09/18/19 1703 18     Temp 09/18/19 1703 98.6 F (37 C)     Temp Source 09/18/19 1703 Oral     SpO2 09/18/19 1703 99 %     Weight 09/18/19  1704 160 lb (72.6 kg)     Height 09/18/19 1704 5\' 4"  (1.626 m)     Head Circumference --      Peak Flow --      Pain Score 09/18/19 1703 0     Pain Loc --      Pain Edu? --      Excl. in [place]? --     Constitutional: Alert and oriented. Eyes: Conjunctivae are normal. Head: Atraumatic. Nose: No congestion/rhinnorhea. Mouth/Throat: Mucous membranes are moist. Neck: Normal ROM Cardiovascular: Normal rate, regular rhythm. Grossly normal heart sounds. Respiratory: Normal respiratory effort.  No retractions. Lungs CTAB. Gastrointestinal: Soft and nontender. No distention. Genitourinary: deferred Musculoskeletal: No lower extremity tenderness nor edema. Neurologic:  Normal speech and language. No gross focal neurologic deficits are appreciated. Skin: Very superficial lacerations to both wrists with no active bleeding, additional abrasion to neck that does not penetrate skin. Psychiatric: Mood and affect are normal. Speech and behavior are normal.  ____________________________________________   LABS (all labs ordered are listed, but only abnormal results are displayed)  Labs Reviewed  BASIC METABOLIC PANEL - Abnormal; Notable for the following components:      Result Value   CO2 21 (*)    Glucose, Bld 117 (*)    Creatinine, Ser 1.20 (*)    GFR calc non Af Amer 52 (*)    All other components within normal limits  CBC WITH DIFFERENTIAL/PLATELET - Abnormal; Notable for the following components:   Eosinophils Absolute 0.7 (*)    All other components within normal limits  ACETAMINOPHEN LEVEL - Abnormal; Notable for the following components:   Acetaminophen (Tylenol), Serum <10 (*)    All other components within normal limits  SARS CORONAVIRUS 2 (TAT 6-24 HRS)  ETHANOL  SALICYLATE LEVEL  URINE DRUG SCREEN, QUALITATIVE ([facility] ONLY)  URINALYSIS, COMPLETE (UACMP) WITH MICROSCOPIC  POC URINE PREG, ED     PROCEDURES  Procedure(s) performed (including Critical Care):  Procedures    ____________________________________________   INITIAL IMPRESSION / ASSESSMENT AND PLAN / ED COURSE       53 year old female presents to the ED for suicidal ideation as well as attempt to harm herself with lacerations to both  wrists and her neck.  All of these lacerations are very superficial, especially of the injury to her neck, which is more of a linear abrasion and does not penetrate the skin.  We will update the patient's tetanus and screen labs, however she will be medically cleared unless lab work is concerning.  Will maintain patient's IVC and have her evaluated by psychiatry.  Patient evaluated by psychiatry, who will tentatively plan on admission.  Patient remains calm and cooperative at this time.      ____________________________________________   FINAL CLINICAL IMPRESSION(S) / ED DIAGNOSES  Final diagnoses:  Suicidal ideation     ED Discharge Orders    None       Note:  This document was prepared using Dragon voice recognition software and may include unintentional dictation errors.   Chesley NoonJessup, Deiona Hooper, MD 09/19/19 [phone]

## 2019-09-18 NOTE — ED Notes (Signed)
Husband asked to come in until TTS and psych can evaluate patient.

## 2019-09-18 NOTE — ED Notes (Signed)
Psych to eval patient, husband present for evaluation.

## 2019-09-18 NOTE — ED Notes (Signed)
covid swab completed and sent to lab

## 2019-09-18 NOTE — ED Triage Notes (Signed)
Patient from home with suicidal ideation and attempt. Presents with multiple lacerations to bilateral wrist and questionable throat laceration. Patient ambulating into ed accompanied by police officer whom will be obtaining IVC paper work. Patient cooperative and calm,, " reports no longer wants to be here, has failed as a parent"
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
## 2019-09-19 ENCOUNTER — Inpatient Hospital Stay
Admission: EM | Admit: 2019-09-19 | Discharge: 2019-09-19 | DRG: 885 | Disposition: A | Discharge status: Home / Self Care | Payer: Medicare Other | Source: Ambulatory Visit | Attending: Psychiatry | Admitting: Psychiatry

## 2019-09-19 DIAGNOSIS — Z23 Encounter for immunization: Secondary | ICD-10-CM | POA: Diagnosis present

## 2019-09-19 DIAGNOSIS — E119 Type 2 diabetes mellitus without complications: Secondary | ICD-10-CM | POA: Diagnosis present

## 2019-09-19 DIAGNOSIS — F3113 Bipolar disorder, current episode manic without psychotic features, severe: Secondary | ICD-10-CM | POA: Diagnosis not present

## 2019-09-19 DIAGNOSIS — I10 Essential (primary) hypertension: Secondary | ICD-10-CM | POA: Diagnosis present

## 2019-09-19 DIAGNOSIS — F3112 Bipolar disorder, current episode manic without psychotic features, moderate: Secondary | ICD-10-CM | POA: Insufficient documentation

## 2019-09-19 DIAGNOSIS — Z20828 Contact with and (suspected) exposure to other viral communicable diseases: Secondary | ICD-10-CM | POA: Diagnosis present

## 2019-09-19 DIAGNOSIS — K219 Gastro-esophageal reflux disease without esophagitis: Secondary | ICD-10-CM | POA: Diagnosis present

## 2019-09-19 DIAGNOSIS — G43709 Chronic migraine without aura, not intractable, without status migrainosus: Secondary | ICD-10-CM | POA: Diagnosis present

## 2019-09-19 DIAGNOSIS — S61512A Laceration without foreign body of left wrist, initial encounter: Secondary | ICD-10-CM

## 2019-09-19 DIAGNOSIS — E039 Hypothyroidism, unspecified: Secondary | ICD-10-CM | POA: Diagnosis present

## 2019-09-19 DIAGNOSIS — F3181 Bipolar II disorder: Principal | ICD-10-CM

## 2019-09-19 DIAGNOSIS — X789XXA Intentional self-harm by unspecified sharp object, initial encounter: Secondary | ICD-10-CM | POA: Diagnosis present

## 2019-09-19 DIAGNOSIS — Z87891 Personal history of nicotine dependence: Secondary | ICD-10-CM | POA: Diagnosis not present

## 2019-09-19 DIAGNOSIS — D869 Sarcoidosis, unspecified: Secondary | ICD-10-CM | POA: Diagnosis present

## 2019-09-19 HISTORY — DX: Intentional self-harm by unspecified sharp object, initial encounter: X78.9XXA

## 2019-09-19 HISTORY — DX: Laceration without foreign body of left wrist, initial encounter: S61.512A

## 2019-09-19 HISTORY — DX: Bipolar disorder, current episode manic without psychotic features, moderate: F31.12

## 2019-09-19 LAB — URINE DRUG SCREEN, QUALITATIVE (ARMC ONLY)
Amphetamines, Ur Screen: NOT DETECTED
Barbiturates, Ur Screen: POSITIVE — AB
Benzodiazepine, Ur Scrn: POSITIVE — AB
Cannabinoid 50 Ng, Ur ~~LOC~~: NOT DETECTED
Cocaine Metabolite,Ur ~~LOC~~: NOT DETECTED
MDMA (Ecstasy)Ur Screen: NOT DETECTED
Methadone Scn, Ur: NOT DETECTED
Opiate, Ur Screen: NOT DETECTED
Phencyclidine (PCP) Ur S: NOT DETECTED
Tricyclic, Ur Screen: NOT DETECTED

## 2019-09-19 LAB — URINALYSIS, COMPLETE (UACMP) WITH MICROSCOPIC
Bacteria, UA: NONE SEEN
Bilirubin Urine: NEGATIVE
Glucose, UA: NEGATIVE mg/dL
Ketones, ur: NEGATIVE mg/dL
Nitrite: NEGATIVE
Protein, ur: NEGATIVE mg/dL
Specific Gravity, Urine: 1.014 (ref 1.005–1.030)
WBC, UA: >50 WBC/hpf — ABNORMAL HIGH (ref 0–5)
pH: 7 (ref 5.0–8.0)

## 2019-09-19 LAB — SARS CORONAVIRUS 2 (TAT 6-24 HRS): SARS Coronavirus 2: NEGATIVE

## 2019-09-19 MED ORDER — METHOCARBAMOL 500 MG PO TABS: 750.0000 mg | ORAL_TABLET | ORAL | Status: DC | PRN

## 2019-09-19 MED ORDER — LAMOTRIGINE 100 MG PO TABS
200.0000 mg | ORAL_TABLET | Freq: Two times a day (BID) | ORAL | Status: DC
  Administered 2019-09-19: 200 mg via ORAL
  Filled 2019-09-19: qty 2

## 2019-09-19 MED ORDER — FUROSEMIDE 20 MG PO TABS
20.0000 mg | ORAL_TABLET | Freq: Every day | ORAL | Status: DC
  Administered 2019-09-19: 20 mg via ORAL
  Filled 2019-09-19 (×3): qty 1

## 2019-09-19 MED ORDER — BUPROPION HCL ER (XL) 150 MG PO TB24
300.0000 mg | ORAL_TABLET | Freq: Every day | ORAL | Status: DC
  Administered 2019-09-19: 300 mg via ORAL
  Filled 2019-09-19: qty 2

## 2019-09-19 MED ORDER — ALPRAZOLAM 0.5 MG PO TABS: 1.0000 mg | ORAL_TABLET | Freq: Three times a day (TID) | ORAL | Status: DC | PRN

## 2019-09-19 MED ORDER — MAGNESIUM HYDROXIDE 400 MG/5ML PO SUSP: 30.0000 mL | Freq: Every day | ORAL | Status: DC | PRN

## 2019-09-19 MED ORDER — PANTOPRAZOLE SODIUM 40 MG PO TBEC
40.0000 mg | DELAYED_RELEASE_TABLET | Freq: Every day | ORAL | Status: DC
  Administered 2019-09-19: 40 mg via ORAL
  Filled 2019-09-19: qty 1

## 2019-09-19 MED ORDER — ARMODAFINIL 250 MG PO TABS: 250.0000 mg | ORAL_TABLET | Freq: Every day | ORAL | Status: DC

## 2019-09-19 MED ORDER — ACETAMINOPHEN 325 MG PO TABS: 650.0000 mg | ORAL_TABLET | Freq: Four times a day (QID) | ORAL | Status: DC | PRN

## 2019-09-19 MED ORDER — ATORVASTATIN CALCIUM 20 MG PO TABS: 40.0000 mg | ORAL_TABLET | Freq: Every day | ORAL | Status: DC

## 2019-09-19 MED ORDER — PRIMIDONE 50 MG PO TABS
100.0000 mg | ORAL_TABLET | Freq: Every day | ORAL | Status: DC
  Filled 2019-09-19: qty 2

## 2019-09-19 MED ORDER — LEVOTHYROXINE SODIUM 50 MCG PO TABS
100.0000 ug | ORAL_TABLET | Freq: Every day | ORAL | Status: DC
  Administered 2019-09-19: 100 ug via ORAL
  Filled 2019-09-19: qty 2

## 2019-09-19 MED ORDER — PRIMIDONE 50 MG PO TABS
50.0000 mg | ORAL_TABLET | ORAL | Status: DC
  Administered 2019-09-19: 50 mg via ORAL
  Filled 2019-09-19 (×2): qty 1

## 2019-09-19 MED ORDER — ALUM & MAG HYDROXIDE-SIMETH 200-200-20 MG/5ML PO SUSP: 30.0000 mL | ORAL | Status: DC | PRN

## 2019-09-19 MED ORDER — ESCITALOPRAM OXALATE 10 MG PO TABS
20.0000 mg | ORAL_TABLET | Freq: Every day | ORAL | Status: DC
  Administered 2019-09-19: 20 mg via ORAL
  Filled 2019-09-19: qty 2

## 2019-09-19 MED ORDER — VITAMIN B-12 1000 MCG PO TABS
2500.0000 ug | ORAL_TABLET | Freq: Every day | ORAL | Status: DC
  Administered 2019-09-19: 2500 ug via ORAL
  Filled 2019-09-19: qty 3

## 2019-09-19 MED ORDER — ALPRAZOLAM 0.5 MG PO TABS
1.0000 mg | ORAL_TABLET | Freq: Three times a day (TID) | ORAL | Status: DC | PRN
  Administered 2019-09-19: 1 mg via ORAL
  Filled 2019-09-19: qty 2

## 2019-09-19 MED ORDER — MELATONIN 5 MG PO TABS: 10.0000 mg | ORAL_TABLET | Freq: Every day | ORAL | Status: DC

## 2019-09-19 MED ORDER — OMEGA-3-ACID ETHYL ESTERS 1 G PO CAPS
1.0000 g | ORAL_CAPSULE | Freq: Every day | ORAL | Status: DC
  Administered 2019-09-19: 1 g via ORAL
  Filled 2019-09-19 (×2): qty 1

## 2019-09-19 MED ORDER — ZIPRASIDONE HCL 40 MG PO CAPS: 180.0000 mg | ORAL_CAPSULE | Freq: Every evening | ORAL | Status: DC

## 2019-09-19 MED ORDER — METHOCARBAMOL 500 MG PO TABS: 750.0000 mg | ORAL_TABLET | Freq: Once | ORAL | Status: DC

## 2019-09-19 MED ORDER — VITAMIN E 180 MG (400 UNIT) PO CAPS
400.0000 [IU] | ORAL_CAPSULE | Freq: Every day | ORAL | Status: DC
  Administered 2019-09-19: 400 [IU] via ORAL
  Filled 2019-09-19 (×2): qty 1

## 2019-09-19 MED ORDER — LIOTHYRONINE SODIUM 5 MCG PO TABS
5.0000 ug | ORAL_TABLET | Freq: Two times a day (BID) | ORAL | Status: DC
  Administered 2019-09-19: 5 ug via ORAL
  Filled 2019-09-19 (×3): qty 1

## 2019-09-19 MED ORDER — OSPEMIFENE 60 MG PO TABS: 1.0000 | ORAL_TABLET | Freq: Every day | ORAL | Status: DC

## 2019-09-19 MED ORDER — VITAMIN D3 25 MCG (1000 UNIT) PO TABS
2000.0000 [IU] | ORAL_TABLET | Freq: Every day | ORAL | Status: DC
  Administered 2019-09-19: 2000 [IU] via ORAL
  Filled 2019-09-19 (×2): qty 2

## 2019-09-19 MED ORDER — BENZTROPINE MESYLATE 1 MG PO TABS
1.0000 mg | ORAL_TABLET | Freq: Two times a day (BID) | ORAL | Status: DC
  Administered 2019-09-19: 1 mg via ORAL
  Filled 2019-09-19: qty 1

## 2019-09-19 MED ORDER — PRIMIDONE 50 MG PO TABS: 50.0000 mg | ORAL_TABLET | ORAL | Status: DC

## 2019-09-19 NOTE — Tx Team (Signed)
Initial Treatment Plan 09/19/2019 6:56 AM Jennifer Bright HTX:[id number]    PATIENT STRESSORS: Health problems Marital or family conflict   PATIENT STRENGTHS: Religious Affiliation Supportive family/friends   PATIENT IDENTIFIED PROBLEMS:     depression  Suicidal ideation               DISCHARGE CRITERIA:  Ability to meet basic life and health needs Adequate post-discharge living arrangements Improved stabilization in mood, thinking, and/or behavior Medical problems require only outpatient monitoring Motivation to continue treatment in a less acute level of care Need for constant or close observation no longer present Reduction of life-threatening or endangering symptoms to within safe limits Safe-care adequate arrangements made Verbal commitment to aftercare and medication compliance  PRELIMINARY DISCHARGE PLAN: Outpatient therapy Return to previous living arrangement  PATIENT/FAMILY INVOLVEMENT: This treatment plan has been presented to and reviewed with the patient, Jennifer Bright, and/or family member.  The patient and family have been given the opportunity to ask questions and make suggestions.  Libby Maw, RN 09/19/2019, 6:56 AM

## 2019-09-19 NOTE — Progress Notes (Signed)
  [facility] Adult Case Management Discharge Plan :  Will you be returning to the same living situation after discharge:  Yes,  home At discharge, do you have transportation home?: Yes,  pts husband will pick her up Do you have the ability to pay for your medications: Yes,  [facility] Medicare  Release of information consent forms completed and in the chart;    Patient to Follow up at: Follow-up Information    [facility] Follow up.   Why: You are scheduled to meet with Dr. Toy Care on Monday. December 7th at 515pm. This will be a virtual appointment. Please turn in referral packet to the office to receive therapy with Pamala Hurry. Thank you. Contact information: [address],  [place], [place] [address] Phone:  [phone] Fax:  [phone]          Next level of care provider has access to [place] and Suicide Prevention discussed: Yes,  SPE completed with pts husband  Have you used any form of tobacco in the last 30 days? (Cigarettes, Smokeless Tobacco, Cigars, and/or Pipes): No  Has patient been referred to the Quitline?: N/A patient is not a smoker  Patient has been referred for addiction treatment: [place], LCSW 09/19/2019, 3:30 PM

## 2019-09-19 NOTE — ED Notes (Signed)
Pt sitting in bed awake, with no tremors noted. Pt st that the medication helped. Environment secured with security present. Will continue to monitor and re-assess

## 2019-09-19 NOTE — BHH Counselor (Signed)
CSW provided pt with referral packet for [facility]) at [facility] and informed her once completed and returned they will contact her to schedule an appointment with the provider.

## 2019-09-19 NOTE — Progress Notes (Signed)
Recreation Therapy Notes  Date: 09/19/2019  Time: 9:30 am   Location: Craft room   Behavioral response: N/A   Intervention Topic: Strengths   Discussion/Intervention: Patient did not attend group.   Clinical Observations/Feedback:  Patient did not attend group.   Cavan Bearden LRT/CTRS        Apphia Cropley 09/19/2019 11:24 AM
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
## 2019-09-19 NOTE — ED Notes (Signed)
Report to include Situation, Background, Assessment, and Recommendations received from [facility]. Patient alert and oriented, warm and dry, in no acute distress. Patient denies SI, HI, AVH and pain. Patient made aware of Q15 minute rounds and Engineer, drilling presence for their safety. Patient instructed to come to me with needs or concerns.

## 2019-09-19 NOTE — ED Notes (Signed)
Pt is anxious & exhibiting tremors stating "my nerves are all messed up". NP Kennyth Lose notified, see MAR

## 2019-09-19 NOTE — Tx Team (Addendum)
Interdisciplinary Treatment and Diagnostic Plan Update  09/19/2019 Time of Session: 9am Jennifer Bright MRN: [id number]  Principal Diagnosis: <principal problem not specified>  Secondary Diagnoses: Active Problems:   Bipolar 1 disorder, manic, moderate (HCC)   Current Medications:  Current Facility-Administered Medications  Medication Dose Route Frequency Provider Last Rate Last Dose  . acetaminophen (TYLENOL) tablet 650 mg  650 mg Oral Q6H PRN Caroline Sauger, NP      . ALPRAZolam Duanne Moron) tablet 1 mg  1 mg Oral TID PRN Caroline Sauger, NP      . alum & mag hydroxide-simeth (MAALOX/MYLANTA) 200-200-20 MG/5ML suspension 30 mL  30 mL Oral Q4H PRN Caroline Sauger, NP      . Armodafinil 250 mg  250 mg Oral Daily Caroline Sauger, NP      . atorvastatin (LIPITOR) tablet 40 mg  40 mg Oral QHS Caroline Sauger, NP      . benztropine (COGENTIN) tablet 1 mg  1 mg Oral BID Caroline Sauger, NP   1 mg at 09/19/19 0820  . buPROPion (WELLBUTRIN XL) 24 hr tablet 300 mg  300 mg Oral Daily Caroline Sauger, NP   300 mg at 09/19/19 0820  . cholecalciferol (VITAMIN D) tablet 2,000 Units  2,000 Units Oral Daily Caroline Sauger, NP   2,000 Units at 09/19/19 6861  . escitalopram (LEXAPRO) tablet 20 mg  20 mg Oral Daily Caroline Sauger, NP   20 mg at 09/19/19 6837  . furosemide (LASIX) tablet 20 mg  20 mg Oral Daily Caroline Sauger, NP   20 mg at 09/19/19 0943  . lamoTRIgine (LAMICTAL) tablet 200 mg  200 mg Oral BID Caroline Sauger, NP   200 mg at 09/19/19 2902  . levothyroxine (SYNTHROID) tablet 100 mcg  100 mcg Oral QAC breakfast Caroline Sauger, NP   100 mcg at 09/19/19 [phone]  . liothyronine (CYTOMEL) tablet 5 mcg  5 mcg Oral BID Caroline Sauger, NP   5 mcg at 09/19/19 [phone]  . magnesium hydroxide (MILK OF MAGNESIA) suspension 30 mL  30 mL Oral Daily PRN Caroline Sauger, NP      . Melatonin TABS 10 mg  10 mg Oral QHS Caroline Sauger, NP       . methocarbamol (ROBAXIN) tablet 750 mg  750 mg Oral PRN Caroline Sauger, NP      . omega-3 acid ethyl esters (LOVAZA) capsule 1 g  1 g Oral Daily Caroline Sauger, NP   1 g at 09/19/19 0943  . Ospemifene TABS 1 tablet  1 tablet Oral Daily Caroline Sauger, NP      . pantoprazole (PROTONIX) EC tablet 40 mg  40 mg Oral Daily Caroline Sauger, NP   40 mg at 09/19/19 0223  . primidone (MYSOLINE) tablet 50 mg  50 mg Oral BH-q7a Pernell Dupre, RPH   50 mg at 09/19/19 0944   And  . primidone (MYSOLINE) tablet 100 mg  100 mg Oral QHS Hallaji, Dani Gobble, RPH      . vitamin B-12 (CYANOCOBALAMIN) tablet 2,500 mcg  2,500 mcg Oral Daily Caroline Sauger, NP   2,500 mcg at 09/19/19 3612  . vitamin E capsule 400 Units  400 Units Oral Daily Caroline Sauger, NP   400 Units at 09/19/19 0944  . ziprasidone (GEODON) capsule 180 mg  180 mg Oral QPM Caroline Sauger, NP       PTA Medications: Medications Prior to Admission  Medication Sig Dispense Refill Last Dose  . ALPRAZolam (XANAX) 1 MG tablet Take 1 mg by  mouth 3 (three) times daily as needed for anxiety or sleep.     . Armodafinil 250 MG tablet Take 250 mg by mouth daily.     Marland Kitchen atorvastatin (LIPITOR) 40 MG tablet Take 40 mg by mouth at bedtime.      . benztropine (COGENTIN) 1 MG tablet Take 1 mg by mouth 2 (two) times daily.      Marland Kitchen buPROPion (WELLBUTRIN XL) 300 MG 24 hr tablet Take 300 mg by mouth daily.     . Cholecalciferol (VITAMIN D3) 50 MCG (2000 UT) capsule Take 2,000 Units by mouth daily.     . Cyanocobalamin (B-12) 2500 MCG TABS Take 2,500 mcg by mouth daily.     Marland Kitchen escitalopram (LEXAPRO) 20 MG tablet Take 20 mg by mouth daily.     . furosemide (LASIX) 20 MG tablet Take 20 mg by mouth daily.     Marland Kitchen lamoTRIgine (LAMICTAL) 200 MG tablet Take 200 mg by mouth 2 (two) times daily.     Marland Kitchen levothyroxine (SYNTHROID, LEVOTHROID) 100 MCG tablet Take 100 mcg by mouth daily before breakfast.      . liothyronine (CYTOMEL) 5  MCG tablet Take 5 mcg by mouth 2 (two) times daily.      . Melatonin 10 MG TABS Take 10 mg by mouth at bedtime.     . methocarbamol (ROBAXIN) 750 MG tablet Take 750 mg by mouth as needed for muscle spasms.     . Omega-3 300 MG CAPS Take 1.2 g by mouth daily.      . Ospemifene (OSPHENA) 60 MG TABS Take 1 tablet by mouth daily. 90 tablet 3   . pantoprazole (PROTONIX) 40 MG tablet Take 40 mg by mouth daily.     . primidone (MYSOLINE) 50 MG tablet Take 50-100 mg by mouth See admin instructions. 50 mg daily(breakfast) , 100 mg at bedtime     . topiramate (TOPAMAX) 200 MG tablet Take 300-400 mg by mouth See admin instructions. pt take 2 tabs (400 mg) q am, and 1.5 (300 mg) hs     . Vitamin E Acetate 125 UNIT/ML LIQD Take 670 Units by mouth daily.     . ziprasidone (GEODON) 60 MG capsule Take 180 mg by mouth every evening. After dinner       Patient Stressors: Health problems Marital or family conflict  Patient Strengths: Religious Affiliation Supportive family/friends  Treatment Modalities: Medication Management, Group therapy, Case management,  1 to 1 session with clinician, Psychoeducation, Recreational therapy.   Physician Treatment Plan for Primary Diagnosis: <principal problem not specified> Long Term Goal(s):     Short Term Goals:    Medication Management: Evaluate patient's response, side effects, and tolerance of medication regimen.  Therapeutic Interventions: 1 to 1 sessions, Unit Group sessions and Medication administration.  Evaluation of Outcomes: Not Met  Physician Treatment Plan for Secondary Diagnosis: Active Problems:   Bipolar 1 disorder, manic, moderate ([place])  Long Term Goal(s):     Short Term Goals:       Medication Management: Evaluate patient's response, side effects, and tolerance of medication regimen.  Therapeutic Interventions: 1 to 1 sessions, Unit Group sessions and Medication administration.  Evaluation of Outcomes: Not Met   RN Treatment Plan for  Primary Diagnosis: <principal problem not specified> Long Term Goal(s): Knowledge of disease and therapeutic regimen to maintain health will improve  Short Term Goals: Ability to participate in decision making will improve, Ability to verbalize feelings will improve, Ability to disclose and discuss suicidal  ideas, Ability to identify and develop effective coping behaviors will improve and Compliance with prescribed medications will improve  Medication Management: RN will administer medications as ordered by provider, will assess and evaluate patient's response and provide education to patient for prescribed medication. RN will report any adverse and/or side effects to prescribing provider.  Therapeutic Interventions: 1 on 1 counseling sessions, Psychoeducation, Medication administration, Evaluate responses to treatment, Monitor vital signs and CBGs as ordered, Perform/monitor CIWA, COWS, AIMS and Fall Risk screenings as ordered, Perform wound care treatments as ordered.  Evaluation of Outcomes: Not Met   LCSW Treatment Plan for Primary Diagnosis: <principal problem not specified> Long Term Goal(s): Safe transition to appropriate next level of care at discharge, Engage patient in therapeutic group addressing interpersonal concerns.  Short Term Goals: Engage patient in aftercare planning with referrals and resources  Therapeutic Interventions: Assess for all discharge needs, 1 to 1 time with Social worker, Explore available resources and support systems, Assess for adequacy in community support network, Educate family and significant other(s) on suicide prevention, Complete Psychosocial Assessment, Interpersonal group therapy.  Evaluation of Outcomes: Not Met   Progress in Treatment: Attending groups: No. Participating in groups: No. Taking medication as prescribed: Yes. Toleration medication: Yes. Family/Significant other contact made: No, will contact:  when pt gives permission Patient  understands diagnosis: Yes. Discussing patient identified problems/goals with staff: Yes. Medical problems stabilized or resolved: No. Denies suicidal/homicidal ideation: No. Issues/concerns per patient self-inventory: No. Other: NA  New problem(s) identified: No, Describe:  none  reported  New Short Term/Long Term Goal(s):  Patient Goals:    Discharge Plan or Barriers:   Reason for Continuation of Hospitalization: Depression Medication stabilization Suicidal ideation  Estimated Length of Stay:1-7 days  Recreational Therapy: Patient Stressors: N/A  Patient Goal: Patient will engage in groups without prompting or encouragement from LRT x3 group sessions within 5 recreation therapy group sessions  Attendees: Patient:Jennifer Bright 09/19/2019 11:27 AM  Physician: Alethia Berthold 09/19/2019 11:27 AM  Nursing: Lennie Hummer 09/19/2019 11:27 AM  RN Care Manager: 09/19/2019 11:27 AM  Social Worker: Jeannine Boga Moton 09/19/2019 11:27 AM  Recreational Therapist: Isaias Sakai Yulieth Carrender 09/19/2019 11:27 AM  Other:  09/19/2019 11:27 AM  Other:  09/19/2019 11:27 AM  Other: 09/19/2019 11:27 AM    Scribe for Treatment Team: Yvette Rack, LCSW 09/19/2019 11:27 AM
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
## 2019-09-19 NOTE — Progress Notes (Signed)
D: Patient is aware of  Discharge this shift .  A:Patient denies suicidal /homicidal ideations. Patient received all belongings brought in  No Storage medications. Writer reviewed Discharge Summary, Suicide Risk Assessment, and Transitional Record. Patient also received Prescriptions   from  MD.  Aware  Of follow up appointment .  R: Patient left unit with no questions  Or concerns  With  Husband .

## 2019-09-19 NOTE — ED Notes (Signed)
Pt has a yellow wedding band on the left hand and states that "it will not come off". This RN is unable to take off wedding band. Information to be passed along to on-coming shifts.
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
## 2019-09-19 NOTE — H&P (Signed)
Psychiatric Admission Assessment Adult  Patient Identification: Jennifer Bright MRN:  [id number] Date of Evaluation:  09/19/2019 Chief Complaint:  Major Depression Disorder Principal Diagnosis: Bipolar 2 disorder, major depressive episode ([place]) Diagnosis:  Principal Problem:   Bipolar 2 disorder, major depressive episode ([place]) Active Problems:   Chronic migraine without aura   Essential (primary) hypertension   Diabetes ([place])   Hypothyroidism   Sarcoidosis   Self-inflicted laceration of left wrist ([place])  History of Present Illness: Patient seen and chart reviewed.  53 year old woman with longstanding chronic mental health issues presented to the emergency room after cutting herself on the wrist.  Patient made superficial cuts in the shape of a crucifix on her left wrist.  She has insisted since coming in that she had no intention of killing herself and no wish to die currently.  Patient tells me that she was in an argument on the phone with her daughter and became increasingly frustrated.  After the last time she hung up the phone she took a box cutter and superficially cut herself on the wrist.  Patient admits that this is not a normal behavior for her but again insists that there was no suicidal intent to it.  Patient's family evidently became concerned and called 8.  Patient has chronic depression and anxiety along with multiple medical problems.  Her mood is chronically bad dysphoric anxious.  Sleep is chronically poor.  She is very focused on medical symptoms such as a current feeling of being "swimmy headed" and having abdominal pain.  These been going on for several weeks.  Patient is not reporting any psychotic symptoms currently.  She insists that she feels at her baseline and is very anxious to make sure she gets to her regular doctor's appointment.  She has outpatient psychiatric treatment already in place Associated Signs/Symptoms: Depression Symptoms:  depressed mood, psychomotor  retardation, (Hypo) Manic Symptoms:  Impulsivity, Anxiety Symptoms:  Excessive Worry, Psychotic Symptoms:  None specifically reported PTSD Symptoms: Negative Total Time spent with patient: 1 hour  Past Psychiatric History: Patient has had a longstanding psychiatric problems going back many many years.  Her [facility] but was also only for 1 day.  Prior to that it had been 12 years since her previous admission.  She has had at least 2 suicide attempts but she makes it clear that those were always by overdose and that she would never actually cut herself because of fear of causing her body harm.  Is the patient at risk to self? Yes.    Has the patient been a risk to self in the past 6 months? Yes.    Has the patient been a risk to self within the distant past? Yes.    Is the patient a risk to others? No.  Has the patient been a risk to others in the past 6 months? No.  Has the patient been a risk to others within the distant past? No.   Prior Inpatient Therapy:   Prior Outpatient Therapy:    Alcohol Screening: 1. How often do you have a drink containing alcohol?: Never 2. How many drinks containing alcohol do you have on a typical day when you are drinking?: 1 or 2 3. How often do you have six or more drinks on one occasion?: Never AUDIT-C Score: 0 4. How often during the last year have you found that you were not able to stop drinking once you had started?:  Never 5. How often during the last year have you failed to do what was normally expected from you becasue of drinking?: Never 6. How often during the last year have you needed a first drink in the morning to get yourself going after a heavy drinking session?: Never 7. How often during the last year have you had a feeling of guilt of remorse after drinking?: Never 8. How often during the last year have you been unable to remember what happened the night before because you had  been drinking?: Never 9. Have you or someone else been injured as a result of your drinking?: No 10. Has a relative or friend or a doctor or another health worker been concerned about your drinking or suggested you cut down?: No Alcohol Use Disorder Identification Test Final Score (AUDIT): 0 Alcohol Brief Interventions/Follow-up: Brief Advice Substance Abuse History in the last 12 months:  No. Consequences of Substance Abuse: Negative Previous Psychotropic Medications: Yes  Psychological Evaluations: Yes  Past Medical History:  Past Medical History:  Diagnosis Date  . Acid reflux   . Anemia   . Anxiety   . Bipolar disorder (HCC)   . Cataract   . Chronic kidney disease    STAGE 3  . Depression   . Diabetes mellitus    NIDD  . Essential tremor   . Family history of breast cancer    doesn't meet medicare guidelines for cancer genetic testing.   . Family history of ovarian cancer    Pt doesn't meet Medicare genetic testing guidelines  . Hyperlipidemia   . Hypothyroid   . IBS (irritable bowel syndrome)   . Migraine headache    vestibular migraine  . Obesity   . Osteopenia   . Restless leg syndrome   . Sarcoidosis   . Sleep apnea   . Syncope \    Past Surgical History:  Procedure Laterality Date  . ABDOMINAL HYSTERECTOMY    . APPENDECTOMY    . BLADDER SURGERY     bladder tact  . CATARACT EXTRACTION W/PHACO Right 09/21/2018   Procedure: CATARACT EXTRACTION PHACO AND INTRAOCULAR LENS PLACEMENT (IOC);  Surgeon: Nevada CraneKing, Bradley Mark, MD;  Location: [facility] ORS;  Service: Ophthalmology;  Laterality: Right;  US  00:20 CDE 00.82 Fluid pack lot # [id number]  . CATARACT EXTRACTION W/PHACO Left 10/17/2018   Procedure: CATARACT EXTRACTION PHACO AND INTRAOCULAR LENS PLACEMENT (IOC)  LEFT;  Surgeon: Nevada CraneKing, Bradley Mark, MD;  [facility];  Service: Ophthalmology;  Laterality: Left;  diabetic - diet controlled  . CESAREAN SECTION    . CHOLECYSTECTOMY    . COLONOSCOPY WITH  PROPOFOL N/A 04/05/2017   Procedure: COLONOSCOPY WITH PROPOFOL;  Surgeon: Christena DeemSkulskie, Martin U, MD;  [facility];  Service: Endoscopy;  Laterality: N/A;  . ESOPHAGOGASTRODUODENOSCOPY (EGD) WITH PROPOFOL N/A 08/28/2016   Procedure: ESOPHAGOGASTRODUODENOSCOPY (EGD) WITH PROPOFOL;  Surgeon: [name], MD;  [facility];  Service: Endoscopy;  Laterality: N/A;  . HERNIA REPAIR    . INGUINAL HERNIA REPAIR     rt and left  . lexiscan cardiolite    . TILT TABLE STUDY N/A 04/01/2012   Procedure: TILT TABLE STUDY;  Surgeon: [name], MD;  [facility];  Service: Cardiovascular;  Laterality: N/A;   Family History:  Family History  Problem Relation Age of Onset  . Colon cancer Father   . Stomach cancer Father   . Hypertension Mother   . Hyperlipidemia Mother   . Stroke Mother   .  Alcohol abuse Brother   . Irritable bowel syndrome Sister   . Breast cancer Paternal Aunt   . Ovarian cancer Paternal Aunt   . Cancer Cousin   . Cancer Cousin   . Ovarian cancer Cousin   . Kidney cancer Neg Hx   . Kidney disease Neg Hx   . Prostate cancer Neg Hx    Family Psychiatric  History: Believes that people on her father's side of the family have had anxiety and depression as well Tobacco Screening: Have you used any form of tobacco in the last 30 days? (Cigarettes, Smokeless Tobacco, Cigars, and/or Pipes): No Social History:  Social History   Substance and Sexual Activity  Alcohol Use No     Social History   Substance and Sexual Activity  Drug Use No    Additional Social History:                           Allergies:   Allergies  Allergen Reactions  . Cleocin [Clindamycin Hcl] Other (See Comments)    GI distress   Lab Results:  Results for orders placed or performed during the hospital encounter of 09/18/19 (from the past 48 hour(s))  Basic metabolic panel     Status: Abnormal   Collection Time: 09/18/19  4:58 PM  Result Value Ref Range    Sodium 140 135 - 145 mmol/L   Potassium 3.8 3.5 - 5.1 mmol/L   Chloride 108 98 - 111 mmol/L   CO2 21 (L) 22 - 32 mmol/L   Glucose, Bld 117 (H) 70 - 99 mg/dL   BUN 17 6 - 20 mg/dL   Creatinine, Ser 9.92 (H) 0.44 - 1.00 mg/dL   Calcium 9.6 8.9 - 42.6 mg/dL   GFR calc non Af Amer 52 (L) >60 mL/min   GFR calc Af Amer >60 >60 mL/min   Anion gap 11 5 - 15    Comment: Performed at [facility], [address] [place], [place] [address]  CBC with Differential     Status: Abnormal   Collection Time: 09/18/19  4:58 PM  Result Value Ref Range   WBC 6.8 4.0 - 10.5 K/uL   RBC 4.63 3.87 - 5.11 MIL/uL   Hemoglobin 13.9 12.0 - 15.0 g/dL   HCT 62.2 29.7 - 98.9 %   MCV 92.2 80.0 - 100.0 fL   MCH 30.0 26.0 - 34.0 pg   MCHC 32.6 30.0 - 36.0 g/dL   RDW 21.1 94.1 - 74.0 %   Platelets 214 150 - 400 K/uL   nRBC 0.0 0.0 - 0.2 %   Neutrophils Relative % 51 %   Neutro Abs 3.5 1.7 - 7.7 K/uL   Lymphocytes Relative 31 %   Lymphs Abs 2.1 0.7 - 4.0 K/uL   Monocytes Relative 6 %   Monocytes Absolute 0.4 0.1 - 1.0 K/uL   Eosinophils Relative 10 %   Eosinophils Absolute 0.7 (H) 0.0 - 0.5 K/uL   Basophils Relative 2 %   Basophils Absolute 0.1 0.0 - 0.1 K/uL   Immature Granulocytes 0 %   Abs Immature Granulocytes 0.03 0.00 - 0.07 K/uL    Comment: Performed at [facility], [address] [place], [place] [address]  Ethanol     Status: None   Collection Time: 09/18/19  4:58 PM  Result Value Ref Range   Alcohol, Ethyl (B) <10 <10 mg/dL    Comment: (NOTE) Lowest detectable limit for serum alcohol  is 10 mg/dL. For medical purposes only. Performed at [facility], [address] [place], [place] [address]   Acetaminophen level     Status: Abnormal   Collection Time: 09/18/19  4:58 PM  Result Value Ref Range   Acetaminophen (Tylenol), Serum <10 (L) 10 - 30 ug/mL    Comment: (NOTE) Therapeutic concentrations vary significantly. A range of 10-30 ug/mL  may be an  effective concentration for many patients. However, some  are best treated at concentrations outside of this range. Acetaminophen concentrations >150 ug/mL at 4 hours after ingestion  and >50 ug/mL at 12 hours after ingestion are often associated with  toxic reactions. Performed at [facility], [address] [place], [place] [address]   Salicylate level     Status: None   Collection Time: 09/18/19  4:58 PM  Result Value Ref Range   Salicylate Lvl <7.0 2.8 - 30.0 mg/dL    Comment: Performed at [facility], [address] [place], [place] [address]  SARS CORONAVIRUS 2 (TAT 6-24 HRS) Nasopharyngeal Nasopharyngeal Swab     Status: None   Collection Time: 09/18/19  7:17 PM   Specimen: Nasopharyngeal Swab  Result Value Ref Range   SARS Coronavirus 2 NEGATIVE NEGATIVE    Comment: (NOTE) SARS-CoV-2 target nucleic acids are NOT DETECTED. The SARS-CoV-2 RNA is generally detectable in upper and lower respiratory specimens during the acute phase of infection. Negative results do not preclude SARS-CoV-2 infection, do not rule out co-infections with other pathogens, and should not be used as the sole basis for treatment or other patient management decisions. Negative results must be combined with clinical observations, patient history, and epidemiological information. The expected result is Negative. Fact Sheet for Patients: HairSlick.no Fact Sheet for Healthcare Providers: quierodirigir.com This test is not yet approved or cleared by the [place] FDA and  has been authorized for detection and/or diagnosis of SARS-CoV-2 by FDA under an Emergency Use Authorization (EUA). This EUA will remain  in effect (meaning this test can be used) for the duration of the COVID-19 declaration under Section 56 4(b)(1) of the Act, 21 U.S.C. section 360bbb-3(b)(1), unless the authorization is terminated or revoked  sooner. Performed at [facility], [address], [place], [place] [address]   Urine Drug Screen, Qualitative     Status: Abnormal   Collection Time: 09/19/19  1:11 AM  Result Value Ref Range   Tricyclic, Ur Screen NONE DETECTED NONE DETECTED   Amphetamines, Ur Screen NONE DETECTED NONE DETECTED   MDMA (Ecstasy)Ur Screen NONE DETECTED NONE DETECTED   Cocaine Metabolite,Ur [place] NONE DETECTED NONE DETECTED   Opiate, Ur Screen NONE DETECTED NONE DETECTED   Phencyclidine (PCP) Ur S NONE DETECTED NONE DETECTED   Cannabinoid 50 Ng, Ur  NONE DETECTED NONE DETECTED   Barbiturates, Ur Screen POSITIVE (A) NONE DETECTED   Benzodiazepine, Ur Scrn POSITIVE (A) NONE DETECTED   Methadone Scn, Ur NONE DETECTED NONE DETECTED    Comment: (NOTE) Tricyclics + metabolites, urine    Cutoff 1000 ng/mL Amphetamines + metabolites, urine  Cutoff 1000 ng/mL MDMA (Ecstasy), urine              Cutoff 500 ng/mL Cocaine Metabolite, urine          Cutoff 300 ng/mL Opiate + metabolites, urine        Cutoff 300 ng/mL Phencyclidine (PCP), urine         Cutoff 25 ng/mL Cannabinoid, urine  Cutoff 50 ng/mL Barbiturates + metabolites, urine  Cutoff 200 ng/mL Benzodiazepine, urine              Cutoff 200 ng/mL Methadone, urine                   Cutoff 300 ng/mL The urine drug screen provides only a preliminary, unconfirmed analytical test result and should not be used for non-medical purposes. Clinical consideration and professional judgment should be applied to any positive drug screen result due to possible interfering substances. A more specific alternate chemical method must be used in order to obtain a confirmed analytical result. Gas chromatography / mass spectrometry (GC/MS) is the preferred confirmat ory method. Performed at [facility], [address] [place], [place] [address]   Urinalysis, Complete w Microscopic     Status: Abnormal   Collection Time: 09/19/19  1:11 AM   Result Value Ref Range   Color, Urine YELLOW (A) YELLOW   APPearance CLOUDY (A) CLEAR   Specific Gravity, Urine 1.014 1.005 - 1.030   pH 7.0 5.0 - 8.0   Glucose, UA NEGATIVE NEGATIVE mg/dL   Hgb urine dipstick SMALL (A) NEGATIVE   Bilirubin Urine NEGATIVE NEGATIVE   Ketones, ur NEGATIVE NEGATIVE mg/dL   Protein, ur NEGATIVE NEGATIVE mg/dL   Nitrite NEGATIVE NEGATIVE   Leukocytes,Ua LARGE (A) NEGATIVE   RBC / HPF 11-20 0 - 5 RBC/hpf   WBC, UA >50 (H) 0 - 5 WBC/hpf   Bacteria, UA NONE SEEN NONE SEEN   Squamous Epithelial / LPF 21-50 0 - 5   Non Squamous Epithelial PRESENT (A) NONE SEEN    Comment: Performed at [facility], [address] [place], [place] [address]    Blood Alcohol level:  Lab Results  Component Value Date   [facility] <10 09/18/2019   ETH <10 05/19/2019    Metabolic Disorder Labs:  Lab Results  Component Value Date   HGBA1C 5.2 07/24/2016   MPG 103 07/24/2016   No results found for: PROLACTIN Lab Results  Component Value Date   CHOL 157 05/17/2014   TRIG 271 (H) 05/17/2014   HDL 29 (L) 05/17/2014   VLDL 54 (H) 05/17/2014   LDLCALC 74 05/17/2014    Current Medications: Current Facility-Administered Medications  Medication Dose Route Frequency Provider Last Rate Last Dose  . acetaminophen (TYLENOL) tablet 650 mg  650 mg Oral Q6H PRN Gillermo Murdoch, NP      . ALPRAZolam Prudy Feeler) tablet 1 mg  1 mg Oral TID PRN Gillermo Murdoch, NP      . alum & mag hydroxide-simeth (MAALOX/MYLANTA) 200-200-20 MG/5ML suspension 30 mL  30 mL Oral Q4H PRN Gillermo Murdoch, NP      . Armodafinil 250 mg  250 mg Oral Daily Gillermo Murdoch, NP      . atorvastatin (LIPITOR) tablet 40 mg  40 mg Oral QHS Gillermo Murdoch, NP      . benztropine (COGENTIN) tablet 1 mg  1 mg Oral BID Gillermo Murdoch, NP   1 mg at 09/19/19 0820  . buPROPion (WELLBUTRIN XL) 24 hr tablet 300 mg  300 mg Oral Daily Gillermo Murdoch, NP   300 mg at 09/19/19 0820  .  cholecalciferol (VITAMIN D) tablet 2,000 Units  2,000 Units Oral Daily Gillermo Murdoch, NP   2,000 Units at 09/19/19 9147  . escitalopram (LEXAPRO) tablet 20 mg  20 mg Oral Daily Gillermo Murdoch, NP   20 mg at 09/19/19 8295  . furosemide (LASIX) tablet  20 mg  20 mg Oral Daily Gillermo Murdoch, NP   20 mg at 09/19/19 0943  . lamoTRIgine (LAMICTAL) tablet 200 mg  200 mg Oral BID Gillermo Murdoch, NP   200 mg at 09/19/19 6045  . levothyroxine (SYNTHROID) tablet 100 mcg  100 mcg Oral QAC breakfast Gillermo Murdoch, NP   100 mcg at 09/19/19 [phone]  . liothyronine (CYTOMEL) tablet 5 mcg  5 mcg Oral BID Gillermo Murdoch, NP   5 mcg at 09/19/19 [phone]  . magnesium hydroxide (MILK OF MAGNESIA) suspension 30 mL  30 mL Oral Daily PRN Gillermo Murdoch, NP      . Melatonin TABS 10 mg  10 mg Oral QHS Gillermo Murdoch, NP      . methocarbamol (ROBAXIN) tablet 750 mg  750 mg Oral PRN Gillermo Murdoch, NP      . methocarbamol (ROBAXIN) tablet 750 mg  750 mg Oral Once Clapacs, John T, MD      . omega-3 acid ethyl esters (LOVAZA) capsule 1 g  1 g Oral Daily Gillermo Murdoch, NP   1 g at 09/19/19 0943  . Ospemifene TABS 1 tablet  1 tablet Oral Daily Gillermo Murdoch, NP      . pantoprazole (PROTONIX) EC tablet 40 mg  40 mg Oral Daily Gillermo Murdoch, NP   40 mg at 09/19/19 4782  . primidone (MYSOLINE) tablet 50 mg  50 mg Oral BH-q7a Gardner Candle, RPH   50 mg at 09/19/19 0944   And  . primidone (MYSOLINE) tablet 100 mg  100 mg Oral QHS Hallaji, Mardene Speak, RPH      . vitamin B-12 (CYANOCOBALAMIN) tablet 2,500 mcg  2,500 mcg Oral Daily Gillermo Murdoch, NP   2,500 mcg at 09/19/19 9562  . vitamin E capsule 400 Units  400 Units Oral Daily Gillermo Murdoch, NP   400 Units at 09/19/19 0944  . ziprasidone (GEODON) capsule 180 mg  180 mg Oral QPM Gillermo Murdoch, NP       PTA Medications: Medications Prior to Admission  Medication Sig Dispense Refill Last Dose   . ALPRAZolam (XANAX) 1 MG tablet Take 1 mg by mouth 3 (three) times daily as needed for anxiety or sleep.     . Armodafinil 250 MG tablet Take 250 mg by mouth daily.     Marland Kitchen atorvastatin (LIPITOR) 40 MG tablet Take 40 mg by mouth at bedtime.      . benztropine (COGENTIN) 1 MG tablet Take 1 mg by mouth 2 (two) times daily.      Marland Kitchen buPROPion (WELLBUTRIN XL) 300 MG 24 hr tablet Take 300 mg by mouth daily.     . Cholecalciferol (VITAMIN D3) 50 MCG (2000 UT) capsule Take 2,000 Units by mouth daily.     . Cyanocobalamin (B-12) 2500 MCG TABS Take 2,500 mcg by mouth daily.     Marland Kitchen escitalopram (LEXAPRO) 20 MG tablet Take 20 mg by mouth daily.     . furosemide (LASIX) 20 MG tablet Take 20 mg by mouth daily.     Marland Kitchen lamoTRIgine (LAMICTAL) 200 MG tablet Take 200 mg by mouth 2 (two) times daily.     Marland Kitchen levothyroxine (SYNTHROID, LEVOTHROID) 100 MCG tablet Take 100 mcg by mouth daily before breakfast.      . liothyronine (CYTOMEL) 5 MCG tablet Take 5 mcg by mouth 2 (two) times daily.      . Melatonin 10 MG TABS Take 10 mg by mouth at bedtime.     . methocarbamol (ROBAXIN) 750  MG tablet Take 750 mg by mouth as needed for muscle spasms.     . Omega-3 300 MG CAPS Take 1.2 g by mouth daily.      . Ospemifene (OSPHENA) 60 MG TABS Take 1 tablet by mouth daily. 90 tablet 3   . pantoprazole (PROTONIX) 40 MG tablet Take 40 mg by mouth daily.     . primidone (MYSOLINE) 50 MG tablet Take 50-100 mg by mouth See admin instructions. 50 mg daily(breakfast) , 100 mg at bedtime     . topiramate (TOPAMAX) 200 MG tablet Take 300-400 mg by mouth See admin instructions. pt take 2 tabs (400 mg) q am, and 1.5 (300 mg) hs     . Vitamin E Acetate 125 UNIT/ML LIQD Take 670 Units by mouth daily.     . ziprasidone (GEODON) 60 MG capsule Take 180 mg by mouth every evening. After dinner       Musculoskeletal: Strength & Muscle Tone: decreased Gait & Station: unable to stand Patient leans: Right  Psychiatric Specialty Exam: Physical Exam   Nursing note and vitals reviewed. Constitutional: She appears well-developed.  HENT:  Head: Normocephalic and atraumatic.  Eyes: Pupils are equal, round, and reactive to light. Conjunctivae are normal.  Neck: Normal range of motion.  Cardiovascular: Regular rhythm and normal heart sounds.  Respiratory: Effort normal.  GI: Soft.  Musculoskeletal: Normal range of motion.  Neurological: She is alert.  Skin: Skin is warm and dry.  Psychiatric: Her mood appears anxious. Her affect is blunt. Her speech is delayed. She is slowed. Cognition and memory are impaired. She expresses impulsivity. She expresses no homicidal and no suicidal ideation.    Review of Systems  Constitutional: Positive for malaise/fatigue.  HENT: Negative.   Eyes: Negative.   Respiratory: Negative.   Cardiovascular: Negative.   Gastrointestinal: Positive for abdominal pain and nausea.  Musculoskeletal: Positive for myalgias.  Skin: Negative.   Neurological: Negative.   Psychiatric/Behavioral: Positive for depression. Negative for hallucinations, substance abuse and suicidal ideas. The patient is nervous/anxious.     Blood pressure 115/85, pulse (!) 114, temperature 97.6 F (36.4 C), temperature source Oral, resp. rate 18, height  (1.549 m), weight 72.6 kg, SpO2 98 %.Body mass index is 30.23 kg/m.  General Appearance: Disheveled  Eye Contact:  Minimal  Speech:  Slow  Volume:  Decreased  Mood:  Dysphoric  Affect:  Constricted  Thought Process:  Coherent  Orientation:  Full (Time, Place, and Person)  Thought Content:  Logical  Suicidal Thoughts:  No  Homicidal Thoughts:  No  Memory:  Immediate;   Fair Recent;   Fair Remote;   Fair  Judgement:  Impaired  Insight:  Shallow  Psychomotor Activity:  Normal  Concentration:  Concentration: Fair  Recall:  [employer] of Knowledge:  Fair  Language:  Fair  Akathisia:  No  Handed:  Right  AIMS (if indicated):     Assets:  Desire for Improvement Housing   ADL's:  Impaired  Cognition:  Impaired,  Mild  Sleep:       Treatment Plan Summary: Daily contact with patient to assess and evaluate symptoms and progress in treatment, Medication management and Plan This is a patient with longstanding chronic multiple psychiatric and medical problems.  She has been on multiple medicines without lasting benefit.  Under the current circumstances it seems very unlikely that inpatient hospitalization would prove of any benefit.  She has outpatient psychiatric treatment and medical treatment already arranged.  Patient insists  that she was not trying to kill her self and has no suicidal ideation.  Her intention is to go back home with her husband and then follow-up with her doctors appointment starting tomorrow.  At this point I think she no longer meets commitment criteria and does not require inpatient treatment would be better served by discharge with no change to medicine.  Observation Level/Precautions:  15 minute checks  Laboratory:  Chemistry Profile  Psychotherapy:    Medications:    Consultations:    Discharge Concerns:    Estimated LOS:  Other:     Physician Treatment Plan for Primary Diagnosis: Bipolar 2 disorder, major depressive episode (HCC) Long Term Goal(s): Improvement in symptoms so as ready for discharge  Short Term Goals: Ability to verbalize feelings will improve and Ability to demonstrate self-control will improve  Physician Treatment Plan for Secondary Diagnosis: Principal Problem:   Bipolar 2 disorder, major depressive episode (HCC) Active Problems:   Chronic migraine without aura   Essential (primary) hypertension   Diabetes (HCC)   Hypothyroidism   Sarcoidosis   Self-inflicted laceration of left wrist (HCC)  Long Term Goal(s): Improvement in symptoms so as ready for discharge  Short Term Goals: Compliance with prescribed medications will improve  I certify that inpatient services furnished can reasonably be expected to  improve the patient's condition.    Mordecai Rasmussen, MD 12/1/20203:11 PM

## 2019-09-19 NOTE — Plan of Care (Signed)
  Problem: Education: Goal: Knowledge of [facility] Education information/materials will improve Outcome: Progressing Goal: Emotional status will improve Outcome: Progressing Goal: Mental status will improve Outcome: Progressing Goal: Verbalization of understanding the information provided will improve Outcome: Progressing  D: Patient admitted for carving a cross into her forearm in order to "release her daughter into the Lord's hands". She denies SI, but admits to multiple prior suicide attempts and three years of ECT treatments three times a week. She claims she has no idea of who her doctor is or what medication she is on, but then complains that she has not gotten the right medication. She has tremors, claims she passes out every time she stands up and has fallen multiple times. 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 Cut herself because she had an argument with her daughter. A: Continue to monitor for safety R: safety maintained.

## 2019-09-19 NOTE — Discharge Summary (Signed)
Physician Discharge Summary Note  Patient:  Jennifer Bright is an 53 y.o., female MRN:  [id number] DOB:  10-Nov-1965 Patient phone:  [phone] (home)  Patient address:   [address] [place] [address],  Total Time spent with patient: 1 hour  Date of Admission:  09/19/2019 Date of Discharge: September 19, 2019  Reason for Admission: Admitted through the emergency room where she presented with superficial cuts to her wrist  Principal Problem: Bipolar 2 disorder, major depressive episode [facility]) Discharge Diagnoses: Principal Problem:   Bipolar 2 disorder, major depressive episode ([place]) Active Problems:   Chronic migraine without aura   Essential (primary) hypertension   Diabetes ([place])   Hypothyroidism   Sarcoidosis   Self-inflicted laceration of left wrist ([place])   Past Psychiatric History: Longstanding mental health problems with mood instability and depression going back decades.  Has outpatient treatment already in place.  Past Medical History:  Past Medical History:  Diagnosis Date  . Acid reflux   . Anemia   . Anxiety   . Bipolar disorder ([place])   . Cataract   . Chronic kidney disease    STAGE 3  . Depression   . Diabetes mellitus    NIDD  . Essential tremor   . Family history of breast cancer    doesn't meet medicare guidelines for cancer genetic testing.   . Family history of ovarian cancer    Pt doesn't meet Medicare genetic testing guidelines  . Hyperlipidemia   . Hypothyroid   . IBS (irritable bowel syndrome)   . Migraine headache    vestibular migraine  . Obesity   . Osteopenia   . Restless leg syndrome   . Sarcoidosis   . Sleep apnea   . Syncope \    Past Surgical History:  Procedure Laterality Date  . ABDOMINAL HYSTERECTOMY    . APPENDECTOMY    . BLADDER SURGERY     bladder tact  . CATARACT EXTRACTION W/PHACO Right 09/21/2018   Procedure: CATARACT EXTRACTION PHACO AND INTRAOCULAR LENS PLACEMENT ([place]);  Surgeon: [name], MD;   Location: [facility] ORS;  Service: Ophthalmology;  Laterality: Right;  [place]  00:20 CDE 00.82 Fluid pack lot # [id number]  . CATARACT EXTRACTION W/PHACO Left 10/17/2018   Procedure: CATARACT EXTRACTION PHACO AND INTRAOCULAR LENS PLACEMENT ([place])  LEFT;  Surgeon: [name], MD;  Location: [facility];  Service: Ophthalmology;  Laterality: Left;  diabetic - diet controlled  . CESAREAN SECTION    . CHOLECYSTECTOMY    . COLONOSCOPY WITH PROPOFOL N/A 04/05/2017   Procedure: COLONOSCOPY WITH PROPOFOL;  Surgeon: [name], MD;  Location: [facility] ENDOSCOPY;  Service: Endoscopy;  Laterality: N/A;  . ESOPHAGOGASTRODUODENOSCOPY (EGD) WITH PROPOFOL N/A 08/28/2016   Procedure: ESOPHAGOGASTRODUODENOSCOPY (EGD) WITH PROPOFOL;  Surgeon: [name], MD;  Location: [facility] ENDOSCOPY;  Service: Endoscopy;  Laterality: N/A;  . HERNIA REPAIR    . INGUINAL HERNIA REPAIR     rt and left  . lexiscan cardiolite    . TILT TABLE STUDY N/A 04/01/2012   Procedure: TILT TABLE STUDY;  Surgeon: Deboraha Sprang, MD;  Location[facility] CATH LAB;  Service: Cardiovascular;  Laterality: N/A;   Family History:  Family History  Problem Relation Age of Onset  . Colon cancer Father   . Stomach cancer Father   . Hypertension Mother   . Hyperlipidemia Mother   . Stroke Mother   . Alcohol abuse Brother   . Irritable bowel syndrome Sister   .  Breast cancer Paternal Aunt   . Ovarian cancer Paternal Aunt   . Cancer Cousin   . Cancer Cousin   . Ovarian cancer Cousin   . Kidney cancer Neg Hx   . Kidney disease Neg Hx   . Prostate cancer Neg Hx    Family Psychiatric  History: Positive for depression Social History:  Social History   Substance and Sexual Activity  Alcohol Use No     Social History   Substance and Sexual Activity  Drug Use No    Social History   Socioeconomic History  . Marital status: Married    Spouse name: Not on file  . Number of children: Not on file  . Years of education: Not on  file  . Highest education level: Not on file  Occupational History  . Occupation: disabled  Social Needs  . Financial resource strain: Not hard at all  . Food insecurity    Worry: Never true    Inability: Never true  . Transportation needs    Medical: No    Non-medical: No  Tobacco Use  . Smoking status: Former Smoker    Types: Cigarettes    Quit date: 2015    Years since quitting: 5.9  . Smokeless tobacco: Never Used  Substance and Sexual Activity  . Alcohol use: No  . Drug use: No  . Sexual activity: Yes  Lifestyle  . Physical activity    Days per week: 0 days    Minutes per session: 0 min  . Stress: Very much  Relationships  . Social Musician on phone: Never    Gets together: Never    Attends religious service: More than 4 times per year    Active member of club or organization: No    Attends meetings of clubs or organizations: Never    Relationship status: Married  Other Topics Concern  . Not on file  Social History Narrative  . Not on file    Hospital Course: Patient admitted to the psychiatric hospital.  Did not make any suicidal threats or gestures in the hospital.  Largely cooperative with treatment.  Continues to have multiple medical problems but has been cooperative with treatment efforts.  On evaluation today it appears that she is at her baseline.  She insists that she has no suicidal thoughts whatsoever.  Given that she has an outpatient medical appointment tomorrow it seems best to go ahead and discharge her to her usual outpatient care.  Given that she has been on a very large number of medicines over time there is no clear indication to change anything at this point.  Physical Findings: AIMS: Facial and Oral Movements Muscles of Facial Expression: None, normal Lips and Perioral Area: None, normal Jaw: None, normal Tongue: None, normal,Extremity Movements Upper (arms, wrists, hands, fingers): None, normal Lower (legs, knees, ankles, toes):  None, normal, Trunk Movements Neck, shoulders, hips: None, normal, Overall Severity Severity of abnormal movements (highest score from questions above): None, normal Incapacitation due to abnormal movements: None, normal Patient's awareness of abnormal movements (rate only patient's report): No Awareness, Dental Status Current problems with teeth and/or dentures?: No Does patient usually wear dentures?: No  CIWA:    COWS:     Musculoskeletal: Strength & Muscle Tone: decreased Gait & Station: unsteady Patient leans: N/A  Psychiatric Specialty Exam: Physical Exam  Nursing note and vitals reviewed. Constitutional: She appears well-developed and well-nourished.  HENT:  Head: Normocephalic and atraumatic.  Eyes: Pupils  are equal, round, and reactive to light. Conjunctivae are normal.  Neck: Normal range of motion.  Cardiovascular: Regular rhythm and normal heart sounds.  Respiratory: Effort normal. No respiratory distress.  GI: Soft.  Musculoskeletal: Normal range of motion.  Neurological: She is alert.  Skin: Skin is warm and dry.  Psychiatric: Her mood appears anxious. Her affect is blunt. Her speech is delayed. She is slowed. She expresses impulsivity. She expresses no suicidal ideation. She exhibits abnormal recent memory.    Review of Systems  Constitutional: Negative.   HENT: Negative.   Eyes: Negative.   Respiratory: Negative.   Cardiovascular: Negative.   Gastrointestinal: Positive for nausea.  Musculoskeletal: Positive for myalgias.  Skin: Negative.   Neurological: Negative.   Psychiatric/Behavioral: Positive for depression. Negative for suicidal ideas.    Blood pressure 115/85, pulse (!) 114, temperature 97.6 F (36.4 C), temperature source Oral, resp. rate 18, height 5\' 1"  (1.549 m), weight 72.6 kg, SpO2 98 %.Body mass index is 30.23 kg/m.  General Appearance: Disheveled  Eye Contact:  Minimal  Speech:  Slow  Volume:  Decreased  Mood:  Dysphoric  Affect:   Congruent  Thought Process:  Goal Directed  Orientation:  Full (Time, Place, and Person)  Thought Content:  Logical  Suicidal Thoughts:  No  Homicidal Thoughts:  No  Memory:  Immediate;   Fair Recent;   Fair Remote;   Fair  Judgement:  Fair  Insight:  Fair  Psychomotor Activity:  Normal  Concentration:  Concentration: Fair  Recall:  Fair  Fund of Knowledge:  Fair  Language:  Fair  Akathisia:  No  Handed:  Right  AIMS (if indicated):     Assets:  Desire for Improvement  ADL's:  Intact  Cognition:  WNL  Sleep:        Have you used any form of tobacco in the last 30 days? (Cigarettes, Smokeless Tobacco, Cigars, and/or Pipes): No  Has this patient used any form of tobacco in the last 30 days? (Cigarettes, Smokeless Tobacco, Cigars, and/or Pipes) Yes, No  Blood Alcohol level:  Lab Results  Component Value Date   ETH <10 09/18/2019   ETH <10 05/19/2019    Metabolic Disorder Labs:  Lab Results  Component Value Date   HGBA1C 5.2 07/24/2016   MPG 103 07/24/2016   No results found for: PROLACTIN Lab Results  Component Value Date   CHOL 157 05/17/2014   TRIG 271 (H) 05/17/2014   HDL 29 (L) 05/17/2014   VLDL 54 (H) 05/17/2014   LDLCALC 74 05/17/2014    See Psychiatric Specialty Exam and Suicide Risk Assessment completed by Attending Physician prior to discharge.  Discharge destination:  Home  Is patient on multiple antipsychotic therapies at discharge:  No   Has Patient had three or more failed trials of antipsychotic monotherapy by history:  No  Recommended Plan for Multiple Antipsychotic Therapies: NA  Discharge Instructions    Diet - low sodium heart healthy   Complete by: As directed    Increase activity slowly   Complete by: As directed      Allergies as of 09/19/2019      Reactions   Cleocin [clindamycin Hcl] Other (See Comments)   GI distress      Medication List    TAKE these medications     Indication  ALPRAZolam 1 MG tablet Commonly known  as: XANAX Take 1 mg by mouth 3 (three) times daily as needed for anxiety or sleep.  Indication: Feeling Anxious  Armodafinil 250 MG tablet Take 250 mg by mouth daily.  Indication: Depression   atorvastatin 40 MG tablet Commonly known as: LIPITOR Take 40 mg by mouth at bedtime.  Indication: High Amount of Fats in the Blood   B-12 2500 MCG Tabs Take 2,500 mcg by mouth daily.  Indication: Vitamin B12 deficiency   benztropine 1 MG tablet Commonly known as: COGENTIN Take 1 mg by mouth 2 (two) times daily.  Indication: Extrapyramidal Reaction caused by Medications   buPROPion 300 MG 24 hr tablet Commonly known as: WELLBUTRIN XL Take 300 mg by mouth daily.  Indication: Major Depressive Disorder   escitalopram 20 MG tablet Commonly known as: LEXAPRO Take 20 mg by mouth daily.  Indication: Major Depressive Disorder, Generalized Anxiety Disorder   furosemide 20 MG tablet Commonly known as: LASIX Take 20 mg by mouth daily.  Indication: Edema   lamoTRIgine 200 MG tablet Commonly known as: LAMICTAL Take 200 mg by mouth 2 (two) times daily.  Indication: Manic-Depression   levothyroxine 100 MCG tablet Commonly known as: SYNTHROID Take 100 mcg by mouth daily before breakfast.  Indication: Underactive Thyroid   liothyronine 5 MCG tablet Commonly known as: CYTOMEL Take 5 mcg by mouth 2 (two) times daily.  Indication: Depression   Melatonin 10 MG Tabs Take 10 mg by mouth at bedtime.  Indication: Sleep   methocarbamol 750 MG tablet Commonly known as: ROBAXIN Take 750 mg by mouth as needed for muscle spasms.  Indication: Musculoskeletal Pain   Omega-3 300 MG Caps Take 1.2 g by mouth daily.  Indication: Psychological Disorder   Osphena 60 MG Tabs Generic drug: Ospemifene Take 1 tablet by mouth daily.  Indication: Atrophic Vaginitis, Dyspareunia   pantoprazole 40 MG tablet Commonly known as: PROTONIX Take 40 mg by mouth daily.  Indication: Gastroesophageal Reflux  Disease   primidone 50 MG tablet Commonly known as: MYSOLINE Take 50-100 mg by mouth See admin instructions. 50 mg daily(breakfast) , 100 mg at bedtime  Indication: Fine to Coarse Slow Tremor affecting Head, Hands & Voice   topiramate 200 MG tablet Commonly known as: TOPAMAX Take 300-400 mg by mouth See admin instructions. pt take 2 tabs (400 mg) q am, and 1.5 (300 mg) hs  Indication: Migraine Headache   Vitamin D3 50 MCG (2000 UT) capsule Take 2,000 Units by mouth daily.  Indication: Osteoporosis   Vitamin E Acetate 125 UNIT/ML Liqd Take 670 Units by mouth daily.  Indication: Vitamin deficiency   ziprasidone 60 MG capsule Commonly known as: GEODON Take 180 mg by mouth every evening. After dinner  Indication: Major Depressive Disorder      Follow-up Information    [facility] Follow up.   Why: You are scheduled to meet with Dr. Evelene Croon on Monday. December 7th at 515pm. This will be a virtual appointment. Thank you. Contact information: [address],  [place], [place] [address] Phone:  [phone] Fax:  [phone]          Follow-up recommendations:  Activity:  Activity as tolerated Diet:  Regular diet Other:  Follow-up with outpatient psychiatrist  Comments: No new prescriptions at discharge past the patient is stable and has not had any medicine changes.  Signed: Mordecai Rasmussen, MD 09/19/2019, 3:20 PM
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
## 2019-09-19 NOTE — BHH Suicide Risk Assessment (Signed)
[facility] Discharge Suicide Risk Assessment   Principal Problem: Bipolar 2 disorder, major depressive episode ([place]) Discharge Diagnoses: Principal Problem:   Bipolar 2 disorder, major depressive episode ([place]) Active Problems:   Chronic migraine without aura   Essential (primary) hypertension   Diabetes (HCC)   Hypothyroidism   Sarcoidosis   Self-inflicted laceration of left wrist ([facility])   Total Time spent with patient: 1 hour  Musculoskeletal: Strength & Muscle Tone: decreased Gait & Station: unable to stand Patient leans: N/A  Psychiatric Specialty Exam: Review of Systems  Constitutional: Positive for malaise/fatigue.  HENT: Negative.   Eyes: Negative.   Respiratory: Negative.   Cardiovascular: Negative.   Gastrointestinal: Positive for abdominal pain and nausea.  Musculoskeletal: Positive for joint pain and myalgias.  Skin: Negative.   Neurological: Positive for headaches.  Psychiatric/Behavioral: Positive for depression. Negative for hallucinations, substance abuse and suicidal ideas. The patient is nervous/anxious.     Blood pressure 115/85, pulse (!) 114, temperature 97.6 F (36.4 C), temperature source Oral, resp. rate 18, height 5\' 1"  (1.549 m), weight 72.6 kg, SpO2 98 %.Body mass index is 30.23 kg/m.  General Appearance: Disheveled  Eye Contact::  Minimal  Speech:  Slow409  Volume:  Decreased  Mood:  Dysphoric  Affect:  Congruent  Thought Process:  Goal Directed  Orientation:  Full (Time, Place, and Person)  Thought Content:  Logical  Suicidal Thoughts:  No  Homicidal Thoughts:  No  Memory:  Immediate;   Fair Recent;   Fair Remote;   Fair  Judgement:  Fair  Insight:  Fair  Psychomotor Activity:  Normal  Concentration:  Fair  Recall:  [employer] of [place]  Language: Fair  Akathisia:  No  Handed:  Right  AIMS (if indicated):     Assets:  Desire for Improvement Housing Social Support  Sleep:     Cognition: Impaired,  Mild  ADL's:  Impaired    Mental Status Per Nursing Assessment::   On Admission:  Suicidal ideation indicated by others, Suicide plan, Intention to act on suicide plan  Demographic Factors:  Caucasian  Loss Factors: Decline in physical health  Historical Factors: Impulsivity  Risk Reduction Factors:   Living with another person, especially a relative, Positive social support and Positive therapeutic relationship  Continued Clinical Symptoms:  Bipolar Disorder:   Bipolar II Mixed State  Cognitive Features That Contribute To Risk:  None    Suicide Risk:  Minimal: No identifiable suicidal ideation.  Patients presenting with no risk factors but with morbid ruminations; may be classified as minimal risk based on the severity of the depressive symptoms  Follow-up [place] Follow up.   Why: You are scheduled to meet with Dr. Toy Care on Monday. December 7th at 515pm. This will be a virtual appointment. Thank you. Contact information: [address],  [place], [place] [address] Phone:  [phone] Fax:  [phone]          Plan Of Care/Follow-up recommendations:  Activity:  Activity as tolerated Diet:  Regular diet Other:  Follow-up with outpatient mental health providers  Alethia Berthold, MD 09/19/2019, 3:08 PM

## 2019-09-19 NOTE — BHH Group Notes (Signed)
Feelings Around Diagnosis 09/19/2019 1PM  Type of Therapy/Topic:  Group Therapy:  Feelings about Diagnosis  Participation Level:  Did Not Attend   Description of Group:   This group will allow patients to explore their thoughts and feelings about diagnoses they have received. Patients will be guided to explore their level of understanding and acceptance of these diagnoses. Facilitator will encourage patients to process their thoughts and feelings about the reactions of others to their diagnosis and will guide patients in identifying ways to discuss their diagnosis with significant others in their lives. This group will be process-oriented, with patients participating in exploration of their own experiences, giving and receiving support, and processing challenge from other group members.   Therapeutic Goals: 1. Patient will demonstrate understanding of diagnosis as evidenced by identifying two or more symptoms of the disorder 2. Patient will be able to express two feelings regarding the diagnosis 3. Patient will demonstrate their ability to communicate their needs through discussion and/or role play  Summary of Patient Progress:       Therapeutic Modalities:   Cognitive Behavioral Therapy Brief Therapy Feelings Identification    Yvette Rack, LCSW 09/19/2019 2:26 PM

## 2019-09-20 ENCOUNTER — Telehealth (INDEPENDENT_AMBULATORY_CARE_PROVIDER_SITE_OTHER): Payer: Medicare Other | Admitting: Physician Assistant

## 2019-09-20 DIAGNOSIS — R42 Dizziness and giddiness: Secondary | ICD-10-CM | POA: Diagnosis not present

## 2019-09-20 MED ORDER — MECLIZINE HCL 12.5 MG PO TABS: 12.5000 mg | ORAL_TABLET | Freq: Three times a day (TID) | ORAL | 0 refills | Status: DC | PRN

## 2019-09-20 NOTE — Progress Notes (Signed)
Patient: Jennifer Bright Female    DOB: January 27, 1966   53 y.o.   MRN: [id number] Visit Date: 09/20/2019  Today's Provider: Trey Sailors, PA-C   Chief Complaint  Patient presents with  . Dizziness   Subjective:    Virtual Visit via Video Note  I connected with Jennifer Bright on 09/20/19 at  4:00 PM EST by a video enabled telemedicine application and verified that I am speaking with the correct person using two identifiers.  Location: Patient: Home Provider: Office   I discussed the limitations of evaluation and management by telemedicine and the availability of in person appointments. The patient expressed understanding and agreed to proceed.  Dizziness This is a new problem. The current episode started 1 to 4 weeks ago. The problem occurs intermittently. The problem has been unchanged. Associated symptoms include fatigue and nausea. Pertinent negatives include no headaches. Nothing aggravates the symptoms.   Patient reports she normally passes out from vestibular migraines but patient reports she is currently having a waviness or dizziness that makes her arms feel like spaghetti noodles. She reports some nausea. Reports these symptoms have been going on for 1.5 weeks. Reports it will make it difficult to move around unless she is holding on to the walls. Reports she checks her BP twice a day which is normal for her. Reports she is dizzy about 80% of the day. Denies chest pain or SOB when this happens. She denies vision changes or headaches. She denies passing out.   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 She is followed by Dr. Evelene Croon in [facility] for psychiatry and Dr. Annia Belt for neurology at [facility] in [place].   She is on multiple medications for the above listed issues including the following: Xanax,  armodafinil, benztropine, wellbutrin, lexapro, lamictal, primidone, topamax and geodon. Patient was placed back on lamictal in 06/2019 by her neurologist.   Allergies  Allergen Reactions  . Cleocin [Clindamycin Hcl] Other (See Comments)    GI distress     Current Outpatient Medications:  .  ALPRAZolam (XANAX) 1 MG tablet, Take 1 mg by mouth 3 (three) times daily as needed for anxiety or sleep., Disp: , Rfl:  .  Armodafinil 250 MG tablet, Take 250 mg by mouth daily., Disp: , Rfl:  .  atorvastatin (LIPITOR) 40 MG tablet, Take 40 mg by mouth at bedtime. , Disp: , Rfl:  .  benztropine (COGENTIN) 1 MG tablet, Take 1 mg by mouth 2 (two) times daily. , Disp: , Rfl:  .  buPROPion (WELLBUTRIN XL) 300 MG 24 hr tablet, Take 300 mg by mouth daily., Disp: , Rfl:  .  Cholecalciferol (VITAMIN D3) 50 MCG (2000 UT) capsule, Take 2,000 Units by mouth daily., Disp: , Rfl:  .  Cyanocobalamin (B-12) 2500 MCG TABS, Take 2,500 mcg by mouth daily., Disp: , Rfl:  .  escitalopram (LEXAPRO) 20 MG tablet, Take 20 mg by mouth daily., Disp: , Rfl:  .  furosemide (LASIX) 20 MG tablet, Take 20 mg by mouth daily., Disp: , Rfl:  .  lamoTRIgine (LAMICTAL) 200 MG tablet, Take 200 mg by mouth 2 (two) times daily., Disp: , Rfl:  .  levothyroxine (SYNTHROID, LEVOTHROID) 100 MCG tablet, Take 100 mcg by mouth daily before breakfast. , Disp: , Rfl:  .  liothyronine (CYTOMEL) 5 MCG tablet, Take 5 mcg by mouth 2 (two) times  daily. , Disp: , Rfl:  .  Melatonin 10 MG TABS, Take 10 mg by mouth at bedtime., Disp: , Rfl:  .  methocarbamol (ROBAXIN) 750 MG tablet, Take 750 mg by mouth as needed for muscle spasms., Disp: , Rfl:  .  Omega-3 300 MG CAPS, Take 1.2 g by mouth daily. , Disp: , Rfl:  .  Ospemifene (OSPHENA) 60 MG TABS, Take 1 tablet by mouth daily., Disp: 90 tablet, Rfl: 3 .  pantoprazole (PROTONIX) 40 MG tablet, Take 40 mg by mouth daily., Disp: , Rfl:  .  primidone (MYSOLINE) 50 MG tablet, Take 50-100 mg by mouth See admin  instructions. 50 mg daily(breakfast) , 100 mg at bedtime, Disp: , Rfl:  .  topiramate (TOPAMAX) 200 MG tablet, Take 300-400 mg by mouth See admin instructions. pt take 2 tabs (400 mg) q am, and 1.5 (300 mg) hs, Disp: , Rfl:  .  Vitamin E Acetate 125 UNIT/ML LIQD, Take 670 Units by mouth daily., Disp: , Rfl:  .  ziprasidone (GEODON) 60 MG capsule, Take 180 mg by mouth every evening. After dinner, Disp: , Rfl:   Review of Systems  Constitutional: Positive for fatigue.  Respiratory: Negative.   Gastrointestinal: Positive for nausea.  Neurological: Positive for dizziness. Negative for headaches.    Social History   Tobacco Use  . Smoking status: Former Smoker    Types: Cigarettes    Quit date: 2015    Years since quitting: 5.9  . Smokeless tobacco: Never Used  Substance Use Topics  . Alcohol use: No      Objective:   There were no vitals taken for this visit. There were no vitals filed for this visit.There is no height or weight on file to calculate BMI.   Physical Exam   No results found for any visits on 09/20/19.     Assessment & Plan    1. Dizziness  Patient with a history of bipolar depression, migraines, narcolepsy, and sarcoidosis presenting with the above dizziness. Patient has a complex medical history and is on multiple antipsychotic and neuroleptic medications, many of which cause dizziness and vertigo as side effects. I think her dizziness is likely due to polypharmacy and side effects from her medication.   However, since she had a psychiatric hospitalization just yesterday and since she is on a very complex regimen, I am reluctant to change her medicines. She has an appointment this Friday with her psychiatrist and I have advised her to contact her psychiatrist and neurologist for any possible medication changes.   She could possibly have BPPV though I would like her to investigate and role her medicines would be playing first.   Cannot rule out stroke, though  the symptoms do seem at least somewhat intermittent. Possibly TIA though this is lower on differential. Advised on return precautions.   - meclizine (ANTIVERT) 12.5 MG tablet; Take 1 tablet (12.5 mg total) by mouth 3 (three) times daily as needed for dizziness.  Dispense: 30 tablet; Refill: 0 I discussed the assessment and treatment plan with the patient. The patient was provided an opportunity to ask questions and all were answered. The patient agreed with the plan and demonstrated an understanding of the instructions.   The patient was advised to call back or seek an in-person evaluation if the symptoms worsen or if the condition fails to improve as anticipated.  I provided 25 minutes of non-face-to-face time during this encounter.  The entirety of the information documented in the History  of Present Illness, Review of Systems and Physical Exam were personally obtained by me. Portions of this information were initially documented by [facility], CMA and reviewed by me for thoroughness and accuracy.         Trey SailorsAdriana M Pollak, PA-C  [facility]

## 2019-10-03 ENCOUNTER — Ambulatory Visit: Payer: Self-pay

## 2019-10-03 NOTE — Telephone Encounter (Addendum)
Pt. Reports "about 1 week ago noticed abdominal discomfort and swelling." Denies any diarrhea or constipation. No fever or blood in stool. States it "feels a little better after I eat." Pain comes and goes. Requests a Friday appointment. Appointment made. Reason for Disposition . [1] MODERATE pain (e.g., interferes with normal activities) AND [2] pain comes and goes (cramps) AND [3] present > 24 hours  (Exception: pain with Vomiting or Diarrhea - see that Guideline)  Answer Assessment - Initial Assessment Questions 1. LOCATION: "Where does it hurt?"      Center at her ribs 2. RADIATION: "Does the pain shoot anywhere else?" (e.g., chest, back)     No 3. ONSET: "When did the pain begin?" (e.g., minutes, hours or days ago)      1 week ago 4. SUDDEN: "Gradual or sudden onset?"     Gradual  5. PATTERN "Does the pain come and go, or is it constant?"    - If constant: "Is it getting better, staying the same, or worsening?"      (Note: Constant means the pain never goes away completely; most serious pain is constant and it progresses)     - If intermittent: "How long does it last?" "Do you have pain now?"     (Note: Intermittent means the pain goes away completely between bouts)     Comes and goes 6. SEVERITY: "How bad is the pain?"  (e.g., Scale 1-10; mild, moderate, or severe)   - MILD (1-3): doesn't interfere with normal activities, abdomen soft and not tender to touch    - MODERATE (4-7): interferes with normal activities or awakens from sleep, tender to touch    - SEVERE (8-10): excruciating pain, doubled over, unable to do any normal activities      6 7. RECURRENT SYMPTOM: "Have you ever had this type of abdominal pain before?" If so, ask: "When was the last time?" and "What happened that time?"      No 8. CAUSE: "What do you think is causing the abdominal pain?"     Unsure 9. RELIEVING/AGGRAVATING FACTORS: "What makes it better or worse?" (e.g., movement, antacids, bowel movement)  Eating makes it feel better 10. OTHER SYMPTOMS: "Has there been any vomiting, diarrhea, constipation, or urine problems?"       No - last bowel movement yesterday 11. PREGNANCY: "Is there any chance you are pregnant?" "When was your last menstrual period?"       No  Protocols used: ABDOMINAL PAIN - [facility]

## 2019-10-03 NOTE — Telephone Encounter (Signed)
From [facility] 

## 2019-10-03 NOTE — Telephone Encounter (Signed)
Fyi.

## 2019-10-06 ENCOUNTER — Ambulatory Visit (INDEPENDENT_AMBULATORY_CARE_PROVIDER_SITE_OTHER): Payer: Medicare Other | Admitting: Physician Assistant

## 2019-10-06 ENCOUNTER — Encounter: Payer: Self-pay | Admitting: Physician Assistant

## 2019-10-06 ENCOUNTER — Other Ambulatory Visit: Payer: Self-pay

## 2019-10-06 VITALS — BP 89/63 | HR 99 | Temp 97.3°F | Wt 163.0 lb

## 2019-10-06 DIAGNOSIS — R1013 Epigastric pain: Secondary | ICD-10-CM | POA: Diagnosis not present

## 2019-10-06 NOTE — Patient Instructions (Signed)
Please protonix on an empty stomach 30 minutes before a meal.

## 2019-10-06 NOTE — Progress Notes (Signed)
Jennifer Bright  MRN: [id number] DOB: 06/14/1966  Subjective:  HPI   The patient is a 53 year old female who presents for evaluation of abdominal pain and bloating.  She states that it has been getting worse since being out of the hospital.  She reports some pain in th eepigastric region 15-20 minutes after eating. Denies vomiting and diarrhea. Reports bowel movements every 2-3 days which is normal for her. Denies particular foods making this worse. The pain lasts several minutes which does ease up with a heating pad. She reports reduced appetite. Does not have a gallbladder or an appendix. No reproductive organs. Taking protonix 40 mg in the morning which is about 9:30 AM after she's eaten. Any fever, chills, no weight loss. Reports abdominal swelling which comes and goes.   Patient was unable to reconcile her medications with me, reporting she is not allowed to know anything about her medications.  Pulse Readings from Last 3 Encounters:  10/06/19 99  09/19/19 79  09/01/19 (!) 110      BP Readings from Last 3 Encounters:  10/06/19 (!) 89/63  09/19/19 108/60  09/01/19 114/71     Patient Active Problem List   Diagnosis Date Noted  . Bipolar 1 disorder, manic, moderate (HCC) 09/19/2019  . Bipolar 2 disorder, major depressive episode (HCC) 09/19/2019  . Self-inflicted laceration of left wrist (HCC) 09/19/2019  . Paroxysmal supraventricular tachycardia (HCC) 09/01/2019  . Vaginal atrophy 07/12/2017  . Polypharmacy 08/14/2016  . 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 Hypercalcemia 07/24/2016  . Sarcoidosis 07/24/2016  . Hyperlipidemia, unspecified 11/15/2015  . Type 2 diabetes mellitus without complication, without long-term current use of insulin (HCC) 11/15/2015  . Anxiety disorder 07/29/2015  . Anxiety 05/12/2015  . Bipolar depression (HCC) 05/12/2015  . Essential (primary) hypertension 05/12/2015  . Benign essential tremor 05/12/2015  . Gastro-esophageal reflux disease  without esophagitis 05/12/2015  . Cephalalgia 05/12/2015  . Hypersomnia with sleep apnea 05/12/2015  . Restless leg 05/12/2015  . Diabetes (HCC) 05/12/2015  . Hypothyroidism 05/12/2015  . Migraine variant 05/12/2015  . Bipolar disorder (HCC) 05/12/2015  . Type 2 diabetes mellitus without complications (HCC) 05/12/2015  . Chronic migraine without aura 09/03/2012  . 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 Syncope and collapse 03/17/2012  . Depression 03/17/2012  . Migraine 03/17/2012    Past Medical History:  Diagnosis Date  . Acid reflux   . Anemia   . Anxiety   . Bipolar disorder (HCC)   . Cataract   . Chronic kidney disease    STAGE 3  . Depression   . Diabetes mellitus    NIDD  . Essential tremor   . Family history of breast cancer    doesn't meet medicare guidelines for cancer genetic testing.   . Family history of ovarian cancer    Pt doesn't meet Medicare genetic testing guidelines  . Hyperlipidemia   . Hypothyroid   . IBS (irritable bowel syndrome)   . Migraine headache    vestibular migraine  . Obesity   . Osteopenia   . Restless leg syndrome   . Sarcoidosis   . Sleep apnea   . Syncope \    Social History   Socioeconomic History  . Marital status: Married    Spouse name: Not on file  . Number of children: Not on file  . Years of education: Not on file  . Highest education level: Not on file  Occupational History  . Occupation: disabled  Tobacco  Use  . Smoking status: Former Smoker    Types: Cigarettes    Quit date: 2015    Years since quitting: 5.9  . Smokeless tobacco: Never Used  Substance and Sexual Activity  . Alcohol use: No  . Drug use: No  . Sexual activity: Yes  Other Topics Concern  . Not on file  Social History Narrative  . Not on file   Social Determinants of Health   Financial Resource Strain: Low Risk   . Difficulty of Paying Living Expenses: Not hard at all  Food Insecurity: No Food Insecurity  . Worried About Community education officerunning  Out of Food in the Last Year: Never true  . Ran Out of Food in the Last Year: Never true  Transportation Needs: No Transportation Needs  . Lack of Transportation (Medical): No  . Lack of Transportation (Non-Medical): No  Physical Activity: Inactive  . Days of Exercise per Week: 0 days  . Minutes of Exercise per Session: 0 min  Stress: Stress Concern Present  . Feeling of Stress : Very much  Social Connections: Somewhat Isolated  . Frequency of Communication with Friends and Family: Never  . Frequency of Social Gatherings with Friends and Family: Never  . Attends Religious Services: More than 4 times per year  . Active Member of Clubs or Organizations: No  . Attends BankerClub or Organization Meetings: Never  . Marital Status: Married  Catering managerntimate Partner Violence: Not At Risk  . Fear of Current or Ex-Partner: No  . Emotionally Abused: No  . Physically Abused: No  . Sexually Abused: No    Outpatient Encounter Medications as of 10/06/2019  Medication Sig  . ALPRAZolam (XANAX) 1 MG tablet Take 1 mg by mouth 3 (three) times daily as needed for anxiety or sleep.  Marland Kitchen. amphetamine-dextroamphetamine (ADDERALL XR) 20 MG 24 hr capsule Take by mouth.  . Armodafinil 250 MG tablet Take 250 mg by mouth daily.  Marland Kitchen. atorvastatin (LIPITOR) 40 MG tablet Take 40 mg by mouth at bedtime.   . benztropine (COGENTIN) 1 MG tablet Take 1 mg by mouth 2 (two) times daily.   Marland Kitchen. buPROPion (WELLBUTRIN XL) 300 MG 24 hr tablet Take 300 mg by mouth daily.  . Cholecalciferol (VITAMIN D3) 50 MCG (2000 UT) capsule Take 2,000 Units by mouth daily.  . Cyanocobalamin (B-12) 2500 MCG TABS Take 2,500 mcg by mouth daily.  Marland Kitchen. escitalopram (LEXAPRO) 20 MG tablet Take 20 mg by mouth daily.  . furosemide (LASIX) 20 MG tablet Take 20 mg by mouth daily.  Marland Kitchen. lamoTRIgine (LAMICTAL) 200 MG tablet Take 200 mg by mouth every evening.   Marland Kitchen. levothyroxine (SYNTHROID, LEVOTHROID) 100 MCG tablet Take 100 mcg by mouth daily before breakfast.   .  liothyronine (CYTOMEL) 5 MCG tablet Take 5 mcg by mouth 2 (two) times daily.   . meclizine (ANTIVERT) 12.5 MG tablet Take 1 tablet (12.5 mg total) by mouth 3 (three) times daily as needed for dizziness.  . Melatonin 10 MG TABS Take 10 mg by mouth at bedtime.  . methocarbamol (ROBAXIN) 750 MG tablet Take 750 mg by mouth as needed for muscle spasms.  . Omega-3 300 MG CAPS Take 1.2 g by mouth daily.   . Ospemifene (OSPHENA) 60 MG TABS Take 1 tablet by mouth daily.  . pantoprazole (PROTONIX) 40 MG tablet Take 40 mg by mouth daily.  . primidone (MYSOLINE) 50 MG tablet Take 50-100 mg by mouth See admin instructions. 50 mg daily(breakfast) , 100 mg at bedtime  .  topiramate (TOPAMAX) 200 MG tablet Take 300-400 mg by mouth See admin instructions. pt take 2 tabs (400 mg) q am, and 1.5 (300 mg) hs  . Vitamin E Acetate 125 UNIT/ML LIQD Take 670 Units by mouth daily.  . ziprasidone (GEODON) 60 MG capsule Take 180 mg by mouth every evening. After dinner   No facility-administered encounter medications on file as of 10/06/2019.    Allergies  Allergen Reactions  . Cleocin [Clindamycin Hcl] Other (See Comments)    GI distress    Review of Systems  Constitutional: Positive for malaise/fatigue. Negative for chills, diaphoresis and fever.  HENT: Negative for congestion, ear pain, sinus pain and sore throat.   Respiratory: Negative for cough and shortness of breath.   Cardiovascular: Negative for chest pain.  Gastrointestinal: Positive for abdominal pain. Negative for blood in stool, constipation, diarrhea, heartburn, nausea and vomiting.  Musculoskeletal: Negative for myalgias.  Neurological: Negative for dizziness and headaches.    Objective:  BP (!) 89/63 (BP Location: Left Arm, Patient Position: Sitting, Cuff Size: Large)   Pulse 99   Temp (!) 97.3 F (36.3 C) (Temporal)   Wt 163 lb (73.9 kg)   BMI 30.80 kg/m   Physical Exam  Constitutional: She is well-developed, well-nourished, and in no  distress.  Cardiovascular: Normal rate and regular rhythm.  Pulmonary/Chest: Effort normal and breath sounds normal.  Abdominal: She exhibits no distension. There is no abdominal tenderness.  Skin: Skin is warm and dry.  Psychiatric: Affect and judgment normal.    Assessment and Plan :  1. Epigastric pain  We will have patient take protonix 30 minutes before meal on empty stomach when it will work best. Symptoms seem consistent with GERD and the abdominal distention patient is referring to is adiposity.

## 2019-10-27 ENCOUNTER — Encounter: Payer: Self-pay | Admitting: Obstetrics & Gynecology

## 2019-11-10 ENCOUNTER — Telehealth: Payer: Self-pay

## 2019-11-10 NOTE — Telephone Encounter (Signed)
Pt states she had it done a couple weeks ago, and they told her everything was okay

## 2019-11-10 NOTE — Telephone Encounter (Signed)
-----   Message from Nadara Mustard, MD sent at 11/09/2019  9:19 AM EST ----- Regarding: MMG Received notice she has not received MMG yet as ordered at her Annual. Please check and encourage her to do this, and document conversation.

## 2019-11-15 ENCOUNTER — Encounter: Payer: Self-pay | Admitting: Physician Assistant

## 2019-11-15 ENCOUNTER — Other Ambulatory Visit: Payer: Self-pay

## 2019-11-15 ENCOUNTER — Ambulatory Visit (INDEPENDENT_AMBULATORY_CARE_PROVIDER_SITE_OTHER): Payer: Medicare Other | Admitting: Physician Assistant

## 2019-11-15 VITALS — BP 104/67 | HR 101 | Temp 97.3°F | Wt 153.0 lb

## 2019-11-15 DIAGNOSIS — G43809 Other migraine, not intractable, without status migrainosus: Secondary | ICD-10-CM

## 2019-11-15 DIAGNOSIS — R634 Abnormal weight loss: Secondary | ICD-10-CM | POA: Diagnosis not present

## 2019-11-15 DIAGNOSIS — R5383 Other fatigue: Secondary | ICD-10-CM

## 2019-11-15 DIAGNOSIS — S0990XA Unspecified injury of head, initial encounter: Secondary | ICD-10-CM

## 2019-11-15 DIAGNOSIS — G5603 Carpal tunnel syndrome, bilateral upper limbs: Secondary | ICD-10-CM

## 2019-11-15 DIAGNOSIS — T07XXXA Unspecified multiple injuries, initial encounter: Secondary | ICD-10-CM | POA: Diagnosis not present

## 2019-11-15 DIAGNOSIS — E119 Type 2 diabetes mellitus without complications: Secondary | ICD-10-CM

## 2019-11-15 DIAGNOSIS — G47419 Narcolepsy without cataplexy: Secondary | ICD-10-CM

## 2019-11-15 NOTE — Patient Instructions (Signed)
Cock up wrist splint     Carpal Tunnel Syndrome  Carpal tunnel syndrome is a condition that causes pain in your hand and arm. The carpal tunnel is a narrow area that is on the palm side of your wrist. Repeated wrist motion or certain diseases may cause swelling in the tunnel. This swelling can pinch the main nerve in the wrist (median nerve). What are the causes? This condition may be caused by:  Repeated wrist motions.  Wrist injuries.  Arthritis.  A sac of fluid (cyst) or abnormal growth (tumor) in the carpal tunnel.  Fluid buildup during pregnancy. Sometimes the cause is not known. What increases the risk? The following factors may make you more likely to develop this condition:  Having a job in which you move your wrist in the same way many times. This includes jobs like being a Midwife or a Conservation officer, nature.  Being a woman.  Having other health conditions, such as: ? Diabetes. ? Obesity. ? A thyroid gland that is not active enough (hypothyroidism). ? Kidney failure. What are the signs or symptoms? Symptoms of this condition include:  A tingling feeling in your fingers.  Tingling or a loss of feeling (numbness) in your hand.  Pain in your entire arm. This pain may get worse when you bend your wrist and elbow for a long time.  Pain in your wrist that goes up your arm to your shoulder.  Pain that goes down into your palm or fingers.  A weak feeling in your hands. You may find it hard to grab and hold items. You may feel worse at night. How is this diagnosed? This condition is diagnosed with a medical history and physical exam. You may also have tests, such as:  Electromyogram (EMG). This test checks the signals that the nerves send to the muscles.  Nerve conduction study. This test checks how well signals pass through your nerves.  Imaging tests, such as X-rays, ultrasound, and MRI. These tests check for what might be the cause of your condition. How is this  treated? This condition may be treated with:  Lifestyle changes. You will be asked to stop or change the activity that caused your problem.  Doing exercise and activities that make bones and muscles stronger (physical therapy).  Learning how to use your hand again (occupational therapy).  Medicines for pain and swelling (inflammation). You may have injections in your wrist.  A wrist splint.  Surgery. Follow these instructions at home: If you have a splint:  Wear the splint as told by your doctor. Remove it only as told by your doctor.  Loosen the splint if your fingers: ? Tingle. ? Lose feeling (become numb). ? Turn cold and blue.  Keep the splint clean.  If the splint is not waterproof: ? Do not let it get wet. ? Cover it with a watertight covering when you take a bath or a shower. Managing pain, stiffness, and swelling   If told, put ice on the painful area: ? If you have a removable splint, remove it as told by your doctor. ? Put ice in a plastic bag. ? Place a towel between your skin and the bag. ? Leave the ice on for 20 minutes, 2-3 times per day. General instructions  Take over-the-counter and prescription medicines only as told by your doctor.  Rest your wrist from any activity that may cause pain. If needed, talk with your boss at work about changes that can help your wrist heal.  Do any exercises as told by your doctor, physical therapist, or occupational therapist.  Keep all follow-up visits as told by your doctor. This is important. Contact a doctor if:  You have new symptoms.  Medicine does not help your pain.  Your symptoms get worse. Get help right away if:  You have very bad numbness or tingling in your wrist or hand. Summary  Carpal tunnel syndrome is a condition that causes pain in your hand and arm.  It is often caused by repeated wrist motions.  Lifestyle changes and medicines are used to treat this problem. Surgery may help in very  bad cases.  Follow your doctor's instructions about wearing a splint, resting your wrist, keeping follow-up visits, and calling for help. This information is not intended to replace advice given to you by your health care provider. Make sure you discuss any questions you have with your health care provider. Document Revised: 02/11/2018 Document Reviewed: 02/11/2018 Elsevier Patient Education  [place].

## 2019-11-15 NOTE — Progress Notes (Signed)
Patient: Jennifer Bright Female    DOB: 14-May-1966   54 y.o.   MRN: [id number] Visit Date: 11/15/2019  Today's Provider: Trey Sailors, PA-C   Chief Complaint  Patient presents with  . Mass   Subjective:     HPI  Lump Patient presents today for lump under breast. She had mammogram on 11/09/2019 with [facility] that was normal.   Wt Readings from Last 3 Encounters:  11/15/19 153 lb (69.4 kg)  10/06/19 163 lb (73.9 kg)  09/18/19 160 lb (72.6 kg)     Bruises: multiple bruises on her arms and legs. She doesn't know where they are from. She says they happen for several days and then fade, unlike regular bruises which happen for weeks.   Fatigue: patient reports fatigue ongoing for several months. 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 She is on nuvigil for narcolepsy with her neurologist. She also needs a new neurologist because hers is leaving the practice.   She also reports diffuse body aches that have been happening for several months. She is taking lipitor for HLD assoc. W/ DM II. Soreness is not improved with medications or walking.   Patient also mentions that she has numbness and tingling in her bilateral wrists that gets worse when she wakes up in the morning.   Allergies  Allergen Reactions  . Cleocin [Clindamycin Hcl] Other (See Comments)    GI distress     Current Outpatient Medications:  .  ALPRAZolam (XANAX) 1 MG tablet, Take 1 mg by mouth 3 (three) times daily as needed for anxiety or sleep., Disp: , Rfl:  .  amphetamine-dextroamphetamine (ADDERALL XR) 20 MG 24 hr capsule, Take by mouth., Disp: , Rfl:  .  Armodafinil 250 MG tablet, Take 250 mg by mouth daily., Disp: , Rfl:  .  atorvastatin (LIPITOR) 40 MG tablet, Take 40 mg by mouth at bedtime. , Disp: , Rfl:  .  benztropine (COGENTIN) 1 MG tablet, Take 1 mg by mouth 2 (two) times daily. , Disp: , Rfl:  .  buPROPion (WELLBUTRIN XL) 300 MG 24 hr  tablet, Take 300 mg by mouth daily., Disp: , Rfl:  .  Cholecalciferol (VITAMIN D3) 50 MCG (2000 UT) capsule, Take 2,000 Units by mouth daily., Disp: , Rfl:  .  Cyanocobalamin (B-12) 2500 MCG TABS, Take 2,500 mcg by mouth daily., Disp: , Rfl:  .  escitalopram (LEXAPRO) 20 MG tablet, Take 20 mg by mouth daily., Disp: , Rfl:  .  furosemide (LASIX) 20 MG tablet, Take 20 mg by mouth daily., Disp: , Rfl:  .  lamoTRIgine (LAMICTAL) 200 MG tablet, Take 200 mg by mouth every evening. , Disp: , Rfl:  .  levothyroxine (SYNTHROID, LEVOTHROID) 100 MCG tablet, Take 100 mcg by mouth daily before breakfast. , Disp: , Rfl:  .  liothyronine (CYTOMEL) 5 MCG tablet, Take 5 mcg by mouth 2 (two) times daily. , Disp: , Rfl:  .  meclizine (ANTIVERT) 12.5 MG tablet, Take 1 tablet (12.5 mg total) by mouth 3 (three) times daily as needed for dizziness., Disp: 30 tablet, Rfl: 0 .  Melatonin 10 MG TABS, Take 10 mg by mouth at bedtime., Disp: , Rfl:  .  methocarbamol (ROBAXIN) 750 MG tablet, Take 750 mg by mouth as needed for muscle spasms., Disp: , Rfl:  .  Omega-3 300 MG CAPS, Take 1.2 g by mouth daily. , Disp: , Rfl:  .  Ospemifene (OSPHENA) 60 MG TABS, Take 1 tablet by mouth daily., Disp: 90 tablet, Rfl: 3 .  pantoprazole (PROTONIX) 40 MG tablet, Take 40 mg by mouth daily., Disp: , Rfl:  .  primidone (MYSOLINE) 50 MG tablet, Take 50-100 mg by mouth See admin instructions. 50 mg daily(breakfast) , 100 mg at bedtime, Disp: , Rfl:  .  topiramate (TOPAMAX) 200 MG tablet, Take 300-400 mg by mouth See admin instructions. pt take 2 tabs (400 mg) q am, and 1.5 (300 mg) hs, Disp: , Rfl:  .  Vitamin E Acetate 125 UNIT/ML LIQD, Take 670 Units by mouth daily., Disp: , Rfl:  .  ziprasidone (GEODON) 60 MG capsule, Take 180 mg by mouth every evening. After dinner, Disp: , Rfl:   Review of Systems  Social History   Tobacco Use  . Smoking status: Former Smoker    Types: Cigarettes    Quit date: 2015    Years since quitting: 6.0  .  Smokeless tobacco: Never Used  Substance Use Topics  . Alcohol use: No      Objective:   There were no vitals taken for this visit. There were no vitals filed for this visit.There is no height or weight on file to calculate BMI.   Physical Exam Constitutional:      Appearance: Normal appearance.     Comments: While sitting on the exam table, patient had narcoleptic fit and fall off table and onto the floor. She was unable to be roused for 8 minutes at which point she came to.   HENT:     Head: Abrasion and contusion present. No raccoon eyes or Battle's sign.      Right Ear: Tympanic membrane normal.     Left Ear: Tympanic membrane normal.  Eyes:     Extraocular Movements: Extraocular movements intact.     Pupils: Pupils are equal, round, and reactive to light.  Cardiovascular:     Rate and Rhythm: Normal rate and regular rhythm.     Heart sounds: Normal heart sounds.  Pulmonary:     Effort: Pulmonary effort is normal.     Breath sounds: Normal breath sounds.  Abdominal:     General: Bowel sounds are normal. There is no distension.     Palpations: Abdomen is soft. There is no mass.     Tenderness: There is no abdominal tenderness.  Musculoskeletal:     Cervical back: Full passive range of motion without pain and normal range of motion. No spinous process tenderness.     Comments: Phalen's positive in bilateral wrists.   Neurological:     General: No focal deficit present.     Mental Status: She is alert and oriented to person, place, and time. Mental status is at baseline.     Cranial Nerves: No cranial nerve deficit.     Sensory: No sensory deficit.     Motor: No weakness.     Coordination: Coordination normal.  Psychiatric:        Mood and Affect: Mood normal.        Behavior: Behavior normal.      No results found for any visits on 11/15/19.     Assessment & Plan    1. Multiple bruises  Muscle aches - cut lipitor in half. May review necessity at follow up.    2. Weight loss  Patient is intentionally losing weight.   3. Other fatigue  Likely due to Narcolepsy. Patient is on nuvigil. She had sleep study  several years ago when she was at a higher weight that was negative. Counseled that fatigue will likely be a chronic feature of her narcolepsy.   4. Other migraine without status migrainosus, not intractable  - Ambulatory referral to Neurology  5. Narcolepsy without cataplexy  - Ambulatory referral to Neurology  6. Bilateral carpal tunnel syndrome  Cock up wrist splints bilaterally at night.   7. Type 2 diabetes mellitus without complication, without long-term current use of insulin (HCC)  - TSH - Lipid panel - Comprehensive metabolic panel - CBC with Differential/Platelet - HgB A1c  8. Injury of head, initial encounter  Patient had a narcoleptic attack while she was sitting on the exam table. She fell off the exam table and onto the floor, striking her head in the process. She has a contusion of her left temple and abrasion next to her left lateral eye. When patient came around, she was alert and oriented x 3 with no focal neurodeficit. Counseled patient as she had a direct blow to the head that she will likely have a concussion. Counseled patient that it would not be unreasonable to proceed to the ER and get a CT scan of her head. Patient declines this. Return precautions advised. This patient was examined in conjunction with Dr. Caryn Section.   I have spent 60 minutes with this patient, >50% of which was spent on counseling and coordination of care.      Trinna Post, PA-C  [facility]

## 2019-11-15 NOTE — Progress Notes (Signed)
Patient: Jennifer Bright Female    DOB: 11-01-1965   54 y.o.   MRN: [id number] Visit Date: 11/15/2019  Today's Provider: Trinna Post, PA-C   Chief Complaint  Patient presents with  . Mass   Subjective:     HPI   Bruising easily and in unusual places.   Lump under right breast / upper abdomen.  Fatigue worsening.  Bilateral hand numbness worsening.  Concerned about bone density.   Allergies  Allergen Reactions  . Cleocin [Clindamycin Hcl] Other (See Comments)    GI distress     Current Outpatient Medications:  .  ALPRAZolam (XANAX) 1 MG tablet, Take 1 mg by mouth 3 (three) times daily as needed for anxiety or sleep., Disp: , Rfl:  .  amphetamine-dextroamphetamine (ADDERALL XR) 20 MG 24 hr capsule, Take by mouth., Disp: , Rfl:  .  Armodafinil 250 MG tablet, Take 250 mg by mouth daily., Disp: , Rfl:  .  atorvastatin (LIPITOR) 40 MG tablet, Take 40 mg by mouth at bedtime. , Disp: , Rfl:  .  benztropine (COGENTIN) 1 MG tablet, Take 1 mg by mouth 2 (two) times daily. , Disp: , Rfl:  .  buPROPion (WELLBUTRIN XL) 300 MG 24 hr tablet, Take 300 mg by mouth daily., Disp: , Rfl:  .  Cholecalciferol (VITAMIN D3) 50 MCG (2000 UT) capsule, Take 2,000 Units by mouth daily., Disp: , Rfl:  .  Cyanocobalamin (B-12) 2500 MCG TABS, Take 2,500 mcg by mouth daily., Disp: , Rfl:  .  escitalopram (LEXAPRO) 20 MG tablet, Take 20 mg by mouth daily., Disp: , Rfl:  .  furosemide (LASIX) 20 MG tablet, Take 20 mg by mouth daily., Disp: , Rfl:  .  lamoTRIgine (LAMICTAL) 200 MG tablet, Take 200 mg by mouth every evening. , Disp: , Rfl:  .  levothyroxine (SYNTHROID, LEVOTHROID) 100 MCG tablet, Take 100 mcg by mouth daily before breakfast. , Disp: , Rfl:  .  liothyronine (CYTOMEL) 5 MCG tablet, Take 5 mcg by mouth 2 (two) times daily. , Disp: , Rfl:  .  meclizine (ANTIVERT) 12.5 MG tablet, Take 1 tablet (12.5 mg total) by mouth 3 (three) times daily as needed for dizziness., Disp: 30 tablet,  Rfl: 0 .  Melatonin 10 MG TABS, Take 10 mg by mouth at bedtime., Disp: , Rfl:  .  methocarbamol (ROBAXIN) 750 MG tablet, Take 750 mg by mouth as needed for muscle spasms., Disp: , Rfl:  .  Omega-3 300 MG CAPS, Take 1.2 g by mouth daily. , Disp: , Rfl:  .  Ospemifene (OSPHENA) 60 MG TABS, Take 1 tablet by mouth daily., Disp: 90 tablet, Rfl: 3 .  pantoprazole (PROTONIX) 40 MG tablet, Take 40 mg by mouth daily., Disp: , Rfl:  .  primidone (MYSOLINE) 50 MG tablet, Take 50-100 mg by mouth See admin instructions. 50 mg daily(breakfast) , 100 mg at bedtime, Disp: , Rfl:  .  topiramate (TOPAMAX) 200 MG tablet, Take 300-400 mg by mouth See admin instructions. pt take 2 tabs (400 mg) q am, and 1.5 (300 mg) hs, Disp: , Rfl:  .  Vitamin E Acetate 125 UNIT/ML LIQD, Take 670 Units by mouth daily., Disp: , Rfl:  .  ziprasidone (GEODON) 60 MG capsule, Take 180 mg by mouth every evening. After dinner, Disp: , Rfl:   Review of Systems  Constitutional: Positive for fatigue. Negative for activity change, appetite change, chills, diaphoresis, fever and unexpected weight change.  Respiratory:  Negative.   Cardiovascular: Negative.   Gastrointestinal: Negative.   Hematological: Negative.     Social History   Tobacco Use  . Smoking status: Former Smoker    Types: Cigarettes    Quit date: 2015    Years since quitting: 6.0  . Smokeless tobacco: Never Used  Substance Use Topics  . Alcohol use: No      Objective:   BP 104/67 (BP Location: Right Arm, Patient Position: Sitting, Cuff Size: Normal)   Pulse (!) 101   Temp (!) 97.3 F (36.3 C) (Temporal)   Wt 153 lb (69.4 kg)   BMI 28.91 kg/m  Vitals:   11/15/19 0815  BP: 104/67  Pulse: (!) 101  Temp: (!) 97.3 F (36.3 C)  TempSrc: Temporal  Weight: 153 lb (69.4 kg)  Body mass index is 28.91 kg/m.   Physical Exam   No results found for any visits on 11/15/19.     Assessment & Plan        Trey Sailors, PA-C  [facility]

## 2019-11-20 ENCOUNTER — Telehealth: Payer: Self-pay

## 2019-11-20 NOTE — Telephone Encounter (Signed)
Copied from CRM [id number]. Topic: General - Other >> Nov 20, 2019  2:02 PM Marylen Ponto wrote: Reason for CRM: Pt stated she had an incident last Wednesday when she came in to the office and she is concerned that no one called to check up on her after the incident. Pt requests call back.

## 2019-11-20 NOTE — Telephone Encounter (Signed)
She refused any triage to the ER for head imaging at that time and I gave her express instructions to follow up if worsening and the exact return precautions.  You can call her and see how she is doing but if she is having worsening head pain or any symptoms she will need to be seen in the ER.

## 2019-11-20 NOTE — Telephone Encounter (Signed)
Patient states that she was calling to let Ricki Rodriguez know she had bruises and she does remember Ricki Rodriguez stating to her to go to the ER. Patient states that you are a good doctor and she know you will take care of her and she does not remember if you stated you was going to call her back or not.FYI

## 2019-11-28 ENCOUNTER — Encounter (HOSPITAL_COMMUNITY): Payer: Self-pay | Admitting: Emergency Medicine

## 2019-11-28 ENCOUNTER — Emergency Department (HOSPITAL_COMMUNITY)
Admission: EM | Admit: 2019-11-28 | Discharge: 2019-11-28 | Disposition: A | Discharge status: Home / Self Care | Payer: Medicare Other | Attending: Emergency Medicine | Admitting: Emergency Medicine

## 2019-11-28 ENCOUNTER — Other Ambulatory Visit: Payer: Self-pay

## 2019-11-28 DIAGNOSIS — M542 Cervicalgia: Secondary | ICD-10-CM | POA: Diagnosis not present

## 2019-11-28 DIAGNOSIS — Z5321 Procedure and treatment not carried out due to patient leaving prior to being seen by health care provider: Secondary | ICD-10-CM | POA: Insufficient documentation

## 2019-11-28 DIAGNOSIS — M25512 Pain in left shoulder: Secondary | ICD-10-CM | POA: Diagnosis not present

## 2019-11-28 DIAGNOSIS — M25519 Pain in unspecified shoulder: Secondary | ICD-10-CM | POA: Insufficient documentation

## 2019-11-28 DIAGNOSIS — M25511 Pain in right shoulder: Secondary | ICD-10-CM | POA: Diagnosis not present

## 2019-11-28 NOTE — ED Triage Notes (Signed)
Patient fell from an [facility] table at [facility] 2 weeks ago, denies LOC , reports persistent pain at bilateral shoulders , occipital headache and mid back pain since the fall , denies fever , respirations unlabored , alert and oriented .

## 2019-11-28 NOTE — ED Notes (Signed)
Pt and husband were informed by this tech that the husband would not be able to stay in the ED with the pt. They were adamant about the husband having to stay with the pt because she is on so many different kinds of medications and "can't comprehend" when a doctor talks to her and that her husband speaks for her. This tech talked to CN, Woody, about whether or not the pt meets the ALONE criteria. Pt's chart was reviewed by the CN and it was determined that the pt does not meet ALONE criteria. When this tech came back out to the ED Waiting Room, the pt and her husband were no longer in the waiting room or outside.

## 2019-11-29 ENCOUNTER — Encounter: Payer: Self-pay | Admitting: Physician Assistant

## 2019-11-29 ENCOUNTER — Ambulatory Visit
Admission: RE | Admit: 2019-11-29 | Discharge: 2019-11-29 | Disposition: A | Discharge status: Home / Self Care | Payer: Medicare Other | Source: Ambulatory Visit | Attending: Physician Assistant | Admitting: Physician Assistant

## 2019-11-29 ENCOUNTER — Ambulatory Visit: Payer: Medicare Other | Admitting: Physician Assistant

## 2019-11-29 ENCOUNTER — Ambulatory Visit (INDEPENDENT_AMBULATORY_CARE_PROVIDER_SITE_OTHER): Payer: Medicare Other | Admitting: Physician Assistant

## 2019-11-29 VITALS — BP 115/77 | HR 106 | Temp 96.6°F

## 2019-11-29 DIAGNOSIS — M542 Cervicalgia: Secondary | ICD-10-CM

## 2019-11-29 DIAGNOSIS — S299XXA Unspecified injury of thorax, initial encounter: Secondary | ICD-10-CM | POA: Diagnosis not present

## 2019-11-29 DIAGNOSIS — M898X1 Other specified disorders of bone, shoulder: Secondary | ICD-10-CM

## 2019-11-29 DIAGNOSIS — M25511 Pain in right shoulder: Secondary | ICD-10-CM

## 2019-11-29 DIAGNOSIS — S4992XA Unspecified injury of left shoulder and upper arm, initial encounter: Secondary | ICD-10-CM | POA: Diagnosis not present

## 2019-11-29 DIAGNOSIS — H26493 Other secondary cataract, bilateral: Secondary | ICD-10-CM | POA: Diagnosis not present

## 2019-11-29 DIAGNOSIS — M25512 Pain in left shoulder: Secondary | ICD-10-CM

## 2019-11-29 DIAGNOSIS — M546 Pain in thoracic spine: Secondary | ICD-10-CM

## 2019-11-29 DIAGNOSIS — G8929 Other chronic pain: Secondary | ICD-10-CM

## 2019-11-29 NOTE — Progress Notes (Signed)
Patient: Jennifer Bright Female    DOB: 1965/12/20   54 y.o.   MRN: [id number] Visit Date: 11/29/2019  Today's Provider: Trey Sailors, PA-C   Chief Complaint  Patient presents with   Neck Pain   Shoulder Pain   Back Pain   Subjective:     Neck Pain  This is a new problem. The current episode started 1 to 4 weeks ago. The problem occurs constantly. The problem has been unchanged. The pain is associated with a sleep position and lifting a heavy object. The quality of the pain is described as shooting. The pain is at a severity of 7/10. The pain is moderate. The symptoms are aggravated by position and twisting. The pain is same all the time. Pertinent negatives include no chest pain, leg pain, numbness, pain with swallowing, tingling, trouble swallowing or weakness. She has tried muscle relaxants, NSAIDs, heat and ice for the symptoms. The treatment provided mild relief.  Shoulder Pain  The pain is present in the back, neck, left shoulder and right shoulder. This is a new problem. The current episode started 1 to 4 weeks ago. The problem occurs constantly. The problem has been unchanged. The quality of the pain is described as sharp. The pain is at a severity of 7/10. The pain is moderate. Associated symptoms include a limited range of motion. Pertinent negatives include no numbness, stiffness or tingling. The symptoms are aggravated by lying down. She has tried cold, heat, NSAIDS and rest for the symptoms.  Back Pain This is a new problem. The current episode started 1 to 4 weeks ago. The problem occurs constantly. The problem is unchanged. The quality of the pain is described as stabbing and shooting. The pain does not radiate. The pain is at a severity of 7/10. The pain is moderate. The pain is the same all the time. The symptoms are aggravated by standing and sitting. Pertinent negatives include no chest pain, leg pain, numbness, tingling or weakness. She has tried heat, ice,  muscle relaxant and NSAIDs for the symptoms. The treatment provided mild relief.   Patient fell off exam table in narcoleptic fit on 11/15/2019 and hit her head. She was observed and examined that day. She was advised to go to ER but declined. Now reporting extreme diffuse pain across shoulders and neck. Reports trouble using her right shoulder. She has used muscle relaxers without relief. Not a candidate for NSDAIDs due to history of bariatric surgery and CKD.   She visited [facility] ER yesterday but left after she was told her husband could not accompany her into the ER. She then proceeded to an urgent care where she was advised to have a CT scan. Now she is here for further imaging.   Allergies  Allergen Reactions   Cleocin [Clindamycin Hcl] Other (See Comments)    GI distress     Current Outpatient Medications:    ALPRAZolam (XANAX) 1 MG tablet, Take 1 mg by mouth 3 (three) times daily as needed for anxiety or sleep., Disp: , Rfl:    amphetamine-dextroamphetamine (ADDERALL XR) 20 MG 24 hr capsule, Take by mouth., Disp: , Rfl:    Armodafinil 250 MG tablet, Take 250 mg by mouth daily., Disp: , Rfl:    atorvastatin (LIPITOR) 40 MG tablet, Take 40 mg by mouth at bedtime. , Disp: , Rfl:    benztropine (COGENTIN) 1 MG tablet, Take 1 mg by mouth 2 (two) times daily. , Disp: , Rfl:  buPROPion (WELLBUTRIN XL) 300 MG 24 hr tablet, Take 300 mg by mouth daily., Disp: , Rfl:    Cholecalciferol (VITAMIN D3) 50 MCG (2000 UT) capsule, Take 2,000 Units by mouth daily., Disp: , Rfl:    Cyanocobalamin (B-12) 2500 MCG TABS, Take 2,500 mcg by mouth daily., Disp: , Rfl:    escitalopram (LEXAPRO) 20 MG tablet, Take 20 mg by mouth daily., Disp: , Rfl:    furosemide (LASIX) 20 MG tablet, Take 20 mg by mouth daily., Disp: , Rfl:    lamoTRIgine (LAMICTAL) 200 MG tablet, Take 200 mg by mouth every evening. , Disp: , Rfl:    levothyroxine (SYNTHROID, LEVOTHROID) 100 MCG tablet, Take 100 mcg by mouth daily  before breakfast. , Disp: , Rfl:    liothyronine (CYTOMEL) 5 MCG tablet, Take 5 mcg by mouth 2 (two) times daily. , Disp: , Rfl:    meclizine (ANTIVERT) 12.5 MG tablet, Take 1 tablet (12.5 mg total) by mouth 3 (three) times daily as needed for dizziness., Disp: 30 tablet, Rfl: 0   Melatonin 10 MG TABS, Take 10 mg by mouth at bedtime., Disp: , Rfl:    methocarbamol (ROBAXIN) 750 MG tablet, Take 750 mg by mouth as needed for muscle spasms., Disp: , Rfl:    Omega-3 300 MG CAPS, Take 1.2 g by mouth daily. , Disp: , Rfl:    Ospemifene (OSPHENA) 60 MG TABS, Take 1 tablet by mouth daily., Disp: 90 tablet, Rfl: 3   pantoprazole (PROTONIX) 40 MG tablet, Take 40 mg by mouth daily., Disp: , Rfl:    primidone (MYSOLINE) 50 MG tablet, Take 50-100 mg by mouth See admin instructions. 50 mg daily(breakfast) , 100 mg at bedtime, Disp: , Rfl:    topiramate (TOPAMAX) 200 MG tablet, Take 300-400 mg by mouth See admin instructions. pt take 2 tabs (400 mg) q am, and 1.5 (300 mg) hs, Disp: , Rfl:    Vitamin E Acetate 125 UNIT/ML LIQD, Take 670 Units by mouth daily., Disp: , Rfl:    ziprasidone (GEODON) 60 MG capsule, Take 180 mg by mouth every evening. After dinner, Disp: , Rfl:   Review of Systems  HENT: Negative for trouble swallowing.   Cardiovascular: Negative for chest pain.  Musculoskeletal: Positive for back pain and neck pain. Negative for stiffness.  Neurological: Negative for tingling, weakness and numbness.    Social History   Tobacco Use   Smoking status: Former Smoker    Types: Cigarettes    Quit date: 2015    Years since quitting: 6.1   Smokeless tobacco: Never Used  Substance Use Topics   Alcohol use: No      Objective:   BP 115/77 (BP Location: Left Arm, Patient Position: Sitting, Cuff Size: Normal)    Pulse (!) 106    Temp (!) 96.6 F (35.9 C) (Temporal)  Vitals:   11/29/19 1548  BP: 115/77  Pulse: (!) 106  Temp: (!) 96.6 F (35.9 C)  TempSrc: Temporal  There is no  height or weight on file to calculate BMI.   Physical Exam Constitutional:      General: She is not in acute distress.    Appearance: Normal appearance.  HENT:     Head: Normocephalic and atraumatic.  Cardiovascular:     Rate and Rhythm: Normal rate and regular rhythm.     Heart sounds: Normal heart sounds.  Pulmonary:     Effort: Pulmonary effort is normal.     Breath sounds: Normal breath sounds.  Musculoskeletal:     Right shoulder: Tenderness present. No swelling, deformity, effusion or bony tenderness. Decreased range of motion. Normal strength.     Left shoulder: Tenderness present. No swelling, deformity, effusion or bony tenderness. Normal range of motion. Normal strength.     Cervical back: Tenderness present. No swelling, edema, deformity or bony tenderness. Pain with movement present. Decreased range of motion.     Thoracic back: Normal.     Comments: Patient reports tenderness to very light palpation of glenohumeral region in bilateral shoulders but no pain over bony landmarks.   Neurological:     Mental Status: She is alert. Mental status is at baseline.  Psychiatric:        Mood and Affect: Mood normal.        Behavior: Behavior normal.      No results found for any visits on 11/29/19.     Assessment & Plan    1. Chronic pain of both shoulders  Explained that I will be unable at 4:00 PM to get a STAT CT scan and considering the injury was 2+ weeks ago the necessity of a stat CT scan is questionable. Explained that getting a CT scan for orthopedic issues as an outpatient generally involves long processes with insurance and the only way for her to get STAT Ct imaging would be to go to the ER. She declines this. She reports diffuse shoulder pain where she is extremely tender to light palpation over her shoulder and neck region but not over bony landmarks. Imaging as below. Pain medication ordered.   I have reviewed the xrays below which do not show any fractures.  Likely muscular dysfunction in the setting of fall but could consider a milder fracture not captured by Xray. Trial of pain medication as below and follow up if worsening.   - DG Shoulder Left; Future - DG Shoulder Right; Future - HYDROcodone-acetaminophen (NORCO/VICODIN) 5-325 MG tablet; 1/2 to 1 full tablet twice daily PRN.  Dispense: 10 tablet; Refill: 0  2. Clavicle pain  - DG Chest 2 View; Future - HYDROcodone-acetaminophen (NORCO/VICODIN) 5-325 MG tablet; 1/2 to 1 full tablet twice daily PRN.  Dispense: 10 tablet; Refill: 0  3. Neck pain  - DG Cervical Spine Complete; Future - HYDROcodone-acetaminophen (NORCO/VICODIN) 5-325 MG tablet; 1/2 to 1 full tablet twice daily PRN.  Dispense: 10 tablet; Refill: 0  4. Chronic midline thoracic back pain  - DG Thoracic Spine W/Swimmers; Future - HYDROcodone-acetaminophen (NORCO/VICODIN) 5-325 MG tablet; 1/2 to 1 full tablet twice daily PRN.  Dispense: 10 tablet; Refill: 0  The entirety of the information documented in the History of Present Illness, Review of Systems and Physical Exam were personally obtained by me. Portions of this information were initially documented by [facility] and reviewed by me for thoroughness and accuracy.   I have spent 25 minutes with this patient, >50% of which was spent on counseling and coordination of care.      Trinna Post, PA-C  [facility]

## 2019-11-29 NOTE — Patient Instructions (Signed)

## 2019-11-29 NOTE — Progress Notes (Deleted)
Patient: Jennifer Bright Female    DOB: 17-Sep-1966   54 y.o.   MRN: [id number] Visit Date: 11/29/2019  Today's Provider: Trey Sailors, PA-C   No chief complaint on file.  Subjective:     HPI  Allergies  Allergen Reactions  . Cleocin [Clindamycin Hcl] Other (See Comments)    GI distress     Current Outpatient Medications:  .  ALPRAZolam (XANAX) 1 MG tablet, Take 1 mg by mouth 3 (three) times daily as needed for anxiety or sleep., Disp: , Rfl:  .  amphetamine-dextroamphetamine (ADDERALL XR) 20 MG 24 hr capsule, Take by mouth., Disp: , Rfl:  .  Armodafinil 250 MG tablet, Take 250 mg by mouth daily., Disp: , Rfl:  .  atorvastatin (LIPITOR) 40 MG tablet, Take 40 mg by mouth at bedtime. , Disp: , Rfl:  .  benztropine (COGENTIN) 1 MG tablet, Take 1 mg by mouth 2 (two) times daily. , Disp: , Rfl:  .  buPROPion (WELLBUTRIN XL) 300 MG 24 hr tablet, Take 300 mg by mouth daily., Disp: , Rfl:  .  Cholecalciferol (VITAMIN D3) 50 MCG (2000 UT) capsule, Take 2,000 Units by mouth daily., Disp: , Rfl:  .  Cyanocobalamin (B-12) 2500 MCG TABS, Take 2,500 mcg by mouth daily., Disp: , Rfl:  .  escitalopram (LEXAPRO) 20 MG tablet, Take 20 mg by mouth daily., Disp: , Rfl:  .  furosemide (LASIX) 20 MG tablet, Take 20 mg by mouth daily., Disp: , Rfl:  .  lamoTRIgine (LAMICTAL) 200 MG tablet, Take 200 mg by mouth every evening. , Disp: , Rfl:  .  levothyroxine (SYNTHROID, LEVOTHROID) 100 MCG tablet, Take 100 mcg by mouth daily before breakfast. , Disp: , Rfl:  .  liothyronine (CYTOMEL) 5 MCG tablet, Take 5 mcg by mouth 2 (two) times daily. , Disp: , Rfl:  .  meclizine (ANTIVERT) 12.5 MG tablet, Take 1 tablet (12.5 mg total) by mouth 3 (three) times daily as needed for dizziness., Disp: 30 tablet, Rfl: 0 .  Melatonin 10 MG TABS, Take 10 mg by mouth at bedtime., Disp: , Rfl:  .  methocarbamol (ROBAXIN) 750 MG tablet, Take 750 mg by mouth as needed for muscle spasms., Disp: , Rfl:  .  Omega-3  300 MG CAPS, Take 1.2 g by mouth daily. , Disp: , Rfl:  .  Ospemifene (OSPHENA) 60 MG TABS, Take 1 tablet by mouth daily., Disp: 90 tablet, Rfl: 3 .  pantoprazole (PROTONIX) 40 MG tablet, Take 40 mg by mouth daily., Disp: , Rfl:  .  primidone (MYSOLINE) 50 MG tablet, Take 50-100 mg by mouth See admin instructions. 50 mg daily(breakfast) , 100 mg at bedtime, Disp: , Rfl:  .  topiramate (TOPAMAX) 200 MG tablet, Take 300-400 mg by mouth See admin instructions. pt take 2 tabs (400 mg) q am, and 1.5 (300 mg) hs, Disp: , Rfl:  .  Vitamin E Acetate 125 UNIT/ML LIQD, Take 670 Units by mouth daily., Disp: , Rfl:  .  ziprasidone (GEODON) 60 MG capsule, Take 180 mg by mouth every evening. After dinner, Disp: , Rfl:   Review of Systems  Social History   Tobacco Use  . Smoking status: Former Smoker    Types: Cigarettes    Quit date: 2015    Years since quitting: 6.1  . Smokeless tobacco: Never Used  Substance Use Topics  . Alcohol use: No      Objective:   There were  no vitals taken for this visit. There were no vitals filed for this visit.There is no height or weight on file to calculate BMI.   Physical Exam   No results found for any visits on 11/29/19.     Assessment & Sevier, PA-C  [facility]

## 2019-11-30 ENCOUNTER — Telehealth: Payer: Self-pay

## 2019-11-30 MED ORDER — HYDROCODONE-ACETAMINOPHEN 5-325 MG PO TABS: ORAL_TABLET | ORAL | 0 refills | Status: DC

## 2019-11-30 NOTE — Telephone Encounter (Signed)
-----   Message from Trey Sailors, [place] sent at 11/30/2019  9:34 AM EST ----- Normal CXR, no fracture noted.

## 2019-11-30 NOTE — Telephone Encounter (Signed)
Medication has been sent.  

## 2019-11-30 NOTE — Telephone Encounter (Signed)
-----   Message from Adriana M Pollak, PA-C sent at 11/30/2019  9:34 AM EST ----- Normal CXR, no fracture noted.  

## 2019-11-30 NOTE — Telephone Encounter (Signed)
Copied from CRM [id number]. Topic: General - Other >> Nov 29, 2019  5:25 PM Marylen Ponto wrote: Reason for CRM: Pt spouse stated some pain medication was supposed to be sent to the pharmacy but the Rx has not been received. Pt would like Rx for pain medication sent asap.

## 2019-11-30 NOTE — Telephone Encounter (Signed)
Patient was advised and states that she would like to know why she have a knot on her neck.Please advise. Patient thought that her after visit summary stated that she had a fall at home, but I advised patient that it was advising her of fall prevention at home. Patient states that she does not see or remember that Jennifer Bright advised her to take half of her cholesterol medication. FYI

## 2019-11-30 NOTE — Telephone Encounter (Signed)
Copied from CRM #[id number]. Topic: General - Other >> Nov 29, 2019  5:25 PM Mcneil, Ja-Kwan wrote: Reason for CRM: Pt spouse stated some pain medication was supposed to be sent to the pharmacy but the Rx has not been received. Pt would like Rx for pain medication sent asap. 

## 2019-11-30 NOTE — Telephone Encounter (Signed)
Patient advised.

## 2019-12-04 ENCOUNTER — Telehealth: Payer: Self-pay

## 2019-12-04 DIAGNOSIS — M542 Cervicalgia: Secondary | ICD-10-CM

## 2019-12-04 DIAGNOSIS — M25512 Pain in left shoulder: Secondary | ICD-10-CM

## 2019-12-04 DIAGNOSIS — M898X1 Other specified disorders of bone, shoulder: Secondary | ICD-10-CM

## 2019-12-04 DIAGNOSIS — G8929 Other chronic pain: Secondary | ICD-10-CM

## 2019-12-04 MED ORDER — HYDROCODONE-ACETAMINOPHEN 5-325 MG PO TABS: 1.0000 | ORAL_TABLET | Freq: Four times a day (QID) | ORAL | 0 refills | Status: DC | PRN

## 2019-12-04 MED ORDER — METHYLPREDNISOLONE 4 MG PO TBPK: ORAL_TABLET | ORAL | 0 refills | Status: DC

## 2019-12-04 NOTE — Addendum Note (Signed)
Addended by: Margaretann Loveless on: 12/04/2019 06:15 PM   Modules accepted: Orders

## 2019-12-04 NOTE — Telephone Encounter (Signed)
Patient husband called back to speak to [place]. Asking for a call back please. ASAP.Marland Kitchen

## 2019-12-04 NOTE — Telephone Encounter (Signed)
Patient husband is calling to check status on when he will get a call back Call back [phone]

## 2019-12-04 NOTE — Telephone Encounter (Signed)
Jennifer Bright, your nurse box had not been addressed at all Monday afternoon. I handled this as it seemed urgent and they had been calling all morning.  Sent mychart response

## 2019-12-04 NOTE — Telephone Encounter (Signed)
Copied from CRM [id number]. Topic: General - Inquiry >> Dec 04, 2019  8:24 AM Reggie Pile, NT wrote: Reason for CRM: Patient's husband called in stating wife's pain from fall still hasnt gone away and would like what to do next. Husband stated that pain is not going away even with medication. Please advise and call husband back at [phone].

## 2019-12-04 NOTE — Telephone Encounter (Signed)
Pt called in crying stating she can not go to the ER because she will have to wait 3-4 hours before being seen. Pt requests that Osvaldo Angst calls her back asap.

## 2019-12-05 NOTE — Telephone Encounter (Signed)
Referral placed.

## 2019-12-05 NOTE — Telephone Encounter (Signed)
Called and advised patient and she said she was okay with referral being placed.

## 2019-12-05 NOTE — Addendum Note (Signed)
Addended by: Trey Sailors on: 12/05/2019 04:29 PM   Modules accepted: Orders

## 2019-12-05 NOTE — Telephone Encounter (Signed)
I think since patient is continuing to have such severe pain across multiple areas including shoulder and neck we should have her see an orthopedist to examine her and take a second look. I can place the referral if they are open to it. If she has severe pain that is worsening in the moment I really do think they would need to be seen in the ER to be evaluated.

## 2019-12-08 ENCOUNTER — Telehealth: Payer: Self-pay

## 2019-12-08 NOTE — Telephone Encounter (Signed)
CONFIRMED AND SCREENED FOR 12-12-19 OV. 

## 2019-12-12 ENCOUNTER — Encounter: Payer: Self-pay | Admitting: Internal Medicine

## 2019-12-12 ENCOUNTER — Ambulatory Visit (INDEPENDENT_AMBULATORY_CARE_PROVIDER_SITE_OTHER): Payer: Medicare Other | Admitting: Internal Medicine

## 2019-12-12 ENCOUNTER — Other Ambulatory Visit: Payer: Self-pay

## 2019-12-12 VITALS — BP 93/61 | HR 106 | Temp 98.4°F | Resp 16 | Ht 64.0 in | Wt 156.0 lb

## 2019-12-12 DIAGNOSIS — E039 Hypothyroidism, unspecified: Secondary | ICD-10-CM

## 2019-12-12 DIAGNOSIS — Z1231 Encounter for screening mammogram for malignant neoplasm of breast: Secondary | ICD-10-CM

## 2019-12-12 DIAGNOSIS — R5382 Chronic fatigue, unspecified: Secondary | ICD-10-CM | POA: Diagnosis not present

## 2019-12-12 DIAGNOSIS — F315 Bipolar disorder, current episode depressed, severe, with psychotic features: Secondary | ICD-10-CM

## 2019-12-12 DIAGNOSIS — R55 Syncope and collapse: Secondary | ICD-10-CM

## 2019-12-12 DIAGNOSIS — N1832 Chronic kidney disease, stage 3b: Secondary | ICD-10-CM | POA: Diagnosis not present

## 2019-12-12 DIAGNOSIS — R928 Other abnormal and inconclusive findings on diagnostic imaging of breast: Secondary | ICD-10-CM

## 2019-12-12 NOTE — Progress Notes (Signed)
New Patient Office Visit  Subjective:  Patient ID: Jennifer Bright, female    DOB: 1966-02-18  Age: 54 y.o. MRN: [id number]  CC:  Chief Complaint  Patient presents with  . New Patient (Initial Visit)  . Diabetes  . Anxiety  . Depression    HPI DENNISHA MOUSER presents for establishment of PCP. Pt has complicated medical course with multiple complaints. She has MDD and is followed by psychiatry over 10 years, there might be component of bipolar disorder  ( no records to review at this time). Pt has been told she has sarcoid and was on prednisone for a while. She is having episodes of passing out, further work up is pending, she is in W/C right now due to risk of passing out, she is not driving, all medicines are dispensed by her care giver.Multiple procedures and multiple diagnosis in her chart. BP is noticed to be low, she thinks she has kidney disease and is followed by nephrology. Pt is on multiple medications as well Pt has h/o diabetes in the past, recent hg Ha1c is at target. 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 Acid reflux   . Anemia   . Anxiety   . Bipolar disorder (HCC)   . Cataract   . Chronic kidney disease    STAGE 3  . Depression   . Diabetes mellitus    NIDD  . Essential tremor   . Family history of breast cancer    doesn't meet medicare guidelines for cancer genetic testing.   . Family history of ovarian cancer    Pt doesn't meet Medicare genetic testing guidelines  . Hyperlipidemia   . Hypothyroid   . IBS (irritable bowel syndrome)   . Migraine headache    vestibular migraine  . Obesity   . Osteopenia   . Restless leg syndrome   . Sarcoidosis   . Sleep apnea   . Syncope \    Past Surgical History:  Procedure Laterality Date  . ABDOMINAL HYSTERECTOMY    . APPENDECTOMY    . BLADDER SURGERY     bladder tact  . CATARACT EXTRACTION W/PHACO Right  09/21/2018   Procedure: CATARACT EXTRACTION PHACO AND INTRAOCULAR LENS PLACEMENT (IOC);  Surgeon: Nevada Crane, MD;  Location: [facility] ORS;  Service: Ophthalmology;  Laterality: Right;  [place]  00:20 CDE 00.82 Fluid pack lot # [id number]  . CATARACT EXTRACTION W/PHACO Left 10/17/2018   Procedure: CATARACT EXTRACTION PHACO AND INTRAOCULAR LENS PLACEMENT (IOC)  LEFT;  Surgeon: Nevada Crane, MD;  Location: [facility];  Service: Ophthalmology;  Laterality: Left;  diabetic - diet controlled  . CESAREAN SECTION    . CHOLECYSTECTOMY    . COLONOSCOPY WITH PROPOFOL N/A 04/05/2017   Procedure: COLONOSCOPY WITH PROPOFOL;  Surgeon: Christena Deem, MD;  Location: [facility] ENDOSCOPY;  Service: Endoscopy;  Laterality: N/A;  . ESOPHAGOGASTRODUODENOSCOPY (EGD) WITH PROPOFOL N/A 08/28/2016   Procedure: ESOPHAGOGASTRODUODENOSCOPY (EGD) WITH PROPOFOL;  Surgeon: Christena Deem, MD;  Location: [facility] ENDOSCOPY;  Service: Endoscopy;  Laterality: N/A;  . HERNIA REPAIR    . INGUINAL HERNIA REPAIR     rt and left  . lexiscan cardiolite    . TILT TABLE STUDY N/A 04/01/2012   Procedure: TILT TABLE STUDY;  Surgeon: Duke Salvia, MD;  Location: [facility];  Service: Cardiovascular;  Laterality: N/A;  Family History  Problem Relation Age of Onset  . Colon cancer Father   . Stomach cancer Father   . Hypertension Mother   . Hyperlipidemia Mother   . Stroke Mother   . Alcohol abuse Brother   . Irritable bowel syndrome Sister   . Breast cancer Paternal Aunt   . Ovarian cancer Paternal Aunt   . Cancer Cousin   . Cancer Cousin   . Ovarian cancer Cousin   . Kidney cancer Neg Hx   . Kidney disease Neg Hx   . Prostate cancer Neg Hx     Social History   Socioeconomic History  . Marital status: Married    Spouse name: Not on file  . Number of children: Not on file  . Years of education: Not on file  . Highest education level: Not on file  Occupational History  . Occupation: disabled  Tobacco  Use  . Smoking status: Former Smoker    Types: Cigarettes    Quit date: 2015    Years since quitting: 6.1  . Smokeless tobacco: Never Used  Substance and Sexual Activity  . Alcohol use: No  . Drug use: No  . Sexual activity: Yes  Other Topics Concern  . Not on file  Social History Narrative  . Not on file   Social Determinants of Health   Financial Resource Strain: Low Risk   . Difficulty of Paying Living Expenses: Not hard at all  Food Insecurity: No Food Insecurity  . Worried About Programme researcher, broadcasting/film/video in the Last Year: Never true  . Ran Out of Food in the Last Year: Never true  Transportation Needs: No Transportation Needs  . Lack of Transportation (Medical): No  . Lack of Transportation (Non-Medical): No  Physical Activity: Inactive  . Days of Exercise per Week: 0 days  . Minutes of Exercise per Session: 0 min  Stress: Stress Concern Present  . Feeling of Stress : Very much  Social Connections: Somewhat Isolated  . Frequency of Communication with Friends and Family: Never  . Frequency of Social Gatherings with Friends and Family: Never  . Attends Religious Services: More than 4 times per year  . Active Member of Clubs or Organizations: No  . Attends Banker Meetings: Never  . Marital Status: Married  Catering manager Violence: Not At Risk  . Fear of Current or Ex-Partner: No  . Emotionally Abused: No  . Physically Abused: No  . Sexually Abused: No    ROS Review of Systems  Constitutional: Positive for fatigue. Negative for chills and diaphoresis.  HENT: Negative for ear pain, postnasal drip and sinus pressure.   Eyes: Negative for photophobia, discharge, redness, itching and visual disturbance.  Respiratory: Negative for cough, shortness of breath and wheezing.   Cardiovascular: Negative for chest pain, palpitations and leg swelling.  Gastrointestinal: Negative for abdominal pain, constipation, diarrhea, nausea and vomiting.  Genitourinary: Negative  for dysuria and flank pain.  Musculoskeletal: Negative for arthralgias, back pain, gait problem and neck pain.  Skin: Negative for color change.  Allergic/Immunologic: Negative for environmental allergies and food allergies.  Neurological: Positive for syncope and weakness. Negative for dizziness and headaches.  Hematological: Does not bruise/bleed easily.  Psychiatric/Behavioral: Negative for agitation, behavioral problems (depression) and hallucinations.    Objective:   Today's Vitals: BP 93/61   Pulse (!) 106   Temp 98.4 F (36.9 C)   Resp 16   Ht 5\' 4"  (1.626 m)   Wt 156 lb (70.8  kg)   SpO2 98%   BMI 26.78 kg/m   Physical Exam Constitutional:      General: She is not in acute distress.    Appearance: She is well-developed. She is not diaphoretic.  HENT:     Head: Normocephalic and atraumatic.     Mouth/Throat:     Pharynx: No oropharyngeal exudate.  Eyes:     Pupils: Pupils are equal, round, and reactive to light.  Neck:     Thyroid: No thyromegaly.     Vascular: No JVD.     Trachea: No tracheal deviation.  Cardiovascular:     Rate and Rhythm: Normal rate and regular rhythm.     Heart sounds: Normal heart sounds. No murmur. No friction rub. No gallop.   Pulmonary:     Effort: Pulmonary effort is normal. No respiratory distress.     Breath sounds: No wheezing or rales.  Chest:     Chest wall: No tenderness.  Abdominal:     General: Bowel sounds are normal.     Palpations: Abdomen is soft.  Musculoskeletal:        General: Normal range of motion.     Cervical back: Normal range of motion and neck supple.     Comments: Pt is in W/C   Lymphadenopathy:     Cervical: No cervical adenopathy.  Skin:    General: Skin is warm and dry.  Neurological:     Mental Status: She is alert and oriented to person, place, and time.     Cranial Nerves: No cranial nerve deficit.  Psychiatric:        Behavior: Behavior normal.        Thought Content: Thought content normal.         Judgment: Judgment normal.    Assessment & Plan:  1. Syncope and collapse Polypharmacy, Excessive medications with multiple side effects, pt is willing to taper down some of them, she will need work up for suncope if not done in the past, will review her medical records, pt has lost weight, blood sugars have been normal, will DC enalripril due to low BP   2. Hypothyroidism, unspecified type Pt is on Synthroid and T3 supplement, will DC liothyronine, await labs   3. Chronic kidney disease, stage 3b Renal functions have been improving, ??AKI in the past, will review nephrology notes   4. Chronic fatigue Multifactorial, will DC primidone,???, will need further work up for [employer], labs ordered ANA and CPK    5. Encounter for screening mammogram for malignant neoplasm of breast  MM 3D SCREEN BREAST BILATERAL  US BREAST LTD UNI RIGHT INC AXILLA  6. Bipolar disorder, current episode depressed, severe, with psychotic features ([place]) All medicines are given by Psych, will look into, will taper down her Topamax for now/ she is on multiple antipsych meds   Outpatient Encounter Medications as of 12/12/2019  Medication Sig  . ALPRAZolam (XANAX) 1 MG tablet Take 1 mg by mouth 3 (three) times daily as needed for anxiety or sleep.  Marland Kitchen amphetamine-dextroamphetamine (ADDERALL XR) 30 MG 24 hr capsule Take 30 mg by mouth 2 (two) times daily.  . Armodafinil 250 MG tablet Take 250 mg by mouth daily.  Marland Kitchen atorvastatin (LIPITOR) 40 MG tablet Take 40 mg by mouth at bedtime.   . benztropine (COGENTIN) 1 MG tablet Take 1 mg by mouth 2 (two) times daily.   Marland Kitchen buPROPion (WELLBUTRIN XL) 300 MG 24 hr tablet Take 300 mg by mouth daily.  Marland Kitchen  Cholecalciferol (VITAMIN D3) 50 MCG (2000 UT) capsule Take 2,000 Units by mouth daily.  . Cyanocobalamin (B-12) 2500 MCG TABS Take 2,500 mcg by mouth daily.  [name] 100 MG CAPS Take by mouth.  . escitalopram (LEXAPRO) 20 MG tablet Take 20 mg by mouth daily.   . furosemide (LASIX) 20 MG tablet Take 20 mg by mouth daily.  Marland Kitchen HYDROcodone-acetaminophen (NORCO/VICODIN) 5-325 MG tablet Take 1 tablet by mouth every 6 (six) hours as needed for moderate pain. 1/2 to 1 full tablet twice daily PRN.  Marland Kitchen levothyroxine (SYNTHROID, LEVOTHROID) 100 MCG tablet Take 100 mcg by mouth daily before breakfast.   . liothyronine (CYTOMEL) 5 MCG tablet Take 5 mcg by mouth 2 (two) times daily.   . meclizine (ANTIVERT) 12.5 MG tablet Take 1 tablet (12.5 mg total) by mouth 3 (three) times daily as needed for dizziness.  . Melatonin 10 MG TABS Take 10 mg by mouth at bedtime.  . methocarbamol (ROBAXIN) 750 MG tablet Take 750 mg by mouth as needed for muscle spasms.  . Multiple Vitamin (MULTIVITAMIN) capsule Take by mouth.  . Omega-3 300 MG CAPS Take 1.2 g by mouth daily.   . Ospemifene (OSPHENA) 60 MG TABS Take 1 tablet by mouth daily.  . pantoprazole (PROTONIX) 40 MG tablet Take 40 mg by mouth daily.  . primidone (MYSOLINE) 50 MG tablet Take 50-100 mg by mouth See admin instructions. 50 mg daily(breakfast) , 100 mg at bedtime  . topiramate (TOPAMAX) 200 MG tablet 300-400 mg See admin instructions. pt take 2 tabs (400 mg) q am, and 1.5 (300 mg) hs  . Vitamin E Acetate 125 UNIT/ML LIQD Take 670 Units by mouth daily.  . ziprasidone (GEODON) 60 MG capsule Take 180 mg by mouth every evening. After dinner  . [DISCONTINUED] amphetamine-dextroamphetamine (ADDERALL XR) 20 MG 24 hr capsule Take by mouth.  . [DISCONTINUED] lamoTRIgine (LAMICTAL) 200 MG tablet Take 200 mg by mouth every evening.   . [DISCONTINUED] methylPREDNISolone (MEDROL) 4 MG TBPK tablet 6 day taper; take as directed on package instruction (Patient not taking: Reported on 12/12/2019)   No facility-administered encounter medications on file as of 12/12/2019.    Follow-up: No follow-ups on file. 2 weeks  Beverely Risen, MD

## 2019-12-13 ENCOUNTER — Telehealth: Payer: Self-pay

## 2019-12-13 DIAGNOSIS — D529 Folate deficiency anemia, unspecified: Secondary | ICD-10-CM | POA: Diagnosis not present

## 2019-12-13 DIAGNOSIS — R55 Syncope and collapse: Secondary | ICD-10-CM | POA: Diagnosis not present

## 2019-12-13 DIAGNOSIS — D509 Iron deficiency anemia, unspecified: Secondary | ICD-10-CM | POA: Diagnosis not present

## 2019-12-13 DIAGNOSIS — E611 Iron deficiency: Secondary | ICD-10-CM | POA: Diagnosis not present

## 2019-12-13 DIAGNOSIS — N1832 Chronic kidney disease, stage 3b: Secondary | ICD-10-CM | POA: Diagnosis not present

## 2019-12-13 DIAGNOSIS — E039 Hypothyroidism, unspecified: Secondary | ICD-10-CM | POA: Diagnosis not present

## 2019-12-13 DIAGNOSIS — R5382 Chronic fatigue, unspecified: Secondary | ICD-10-CM | POA: Diagnosis not present

## 2019-12-13 DIAGNOSIS — Z01818 Encounter for other preprocedural examination: Secondary | ICD-10-CM | POA: Diagnosis not present

## 2019-12-13 DIAGNOSIS — D510 Vitamin B12 deficiency anemia due to intrinsic factor deficiency: Secondary | ICD-10-CM | POA: Diagnosis not present

## 2019-12-13 NOTE — Telephone Encounter (Signed)
Left a message per Dr Welton Flakes and asked pt to come in on Thursday 12/21/19 instead of appt she originally had. Beth

## 2019-12-14 LAB — COMPREHENSIVE METABOLIC PANEL
ALT: 24 IU/L (ref 0–32)
AST: 15 IU/L (ref 0–40)
Albumin/Globulin Ratio: 2.6 — ABNORMAL HIGH (ref 1.2–2.2)
Albumin: 4.5 g/dL (ref 3.8–4.9)
Alkaline Phosphatase: 57 IU/L (ref 39–117)
BUN/Creatinine Ratio: 23 (ref 9–23)
BUN: 24 mg/dL (ref 6–24)
Bilirubin Total: <0.2 mg/dL (ref 0.0–1.2)
CO2: 22 mmol/L (ref 20–29)
Calcium: 9.6 mg/dL (ref 8.7–10.2)
Chloride: 107 mmol/L — ABNORMAL HIGH (ref 96–106)
Creatinine, Ser: 1.05 mg/dL — ABNORMAL HIGH (ref 0.57–1.00)
GFR calc Af Amer: 71 mL/min/{1.73_m2} (ref >59)
GFR calc non Af Amer: 61 mL/min/{1.73_m2} (ref >59)
Globulin, Total: 1.7 g/dL (ref 1.5–4.5)
Glucose: 139 mg/dL — ABNORMAL HIGH (ref 65–99)
Potassium: 4.1 mmol/L (ref 3.5–5.2)
Sodium: 140 mmol/L (ref 134–144)
Total Protein: 6.2 g/dL (ref 6.0–8.5)

## 2019-12-14 LAB — T4, FREE: Free T4: 0.95 ng/dL (ref 0.82–1.77)

## 2019-12-14 LAB — CBC WITH DIFFERENTIAL/PLATELET
Basophils Absolute: 0.1 10*3/uL (ref 0.0–0.2)
Basos: 2 %
EOS (ABSOLUTE): 0.3 10*3/uL (ref 0.0–0.4)
Eos: 6 %
Hematocrit: 40.9 % (ref 34.0–46.6)
Hemoglobin: 14 g/dL (ref 11.1–15.9)
Immature Grans (Abs): 0 10*3/uL (ref 0.0–0.1)
Immature Granulocytes: 0 %
Lymphocytes Absolute: 2.1 10*3/uL (ref 0.7–3.1)
Lymphs: 39 %
MCH: 30.6 pg (ref 26.6–33.0)
MCHC: 34.2 g/dL (ref 31.5–35.7)
MCV: 89 fL (ref 79–97)
Monocytes Absolute: 0.4 10*3/uL (ref 0.1–0.9)
Monocytes: 7 %
Neutrophils Absolute: 2.4 10*3/uL (ref 1.4–7.0)
Neutrophils: 46 %
Platelets: 206 10*3/uL (ref 150–450)
RBC: 4.58 x10E6/uL (ref 3.77–5.28)
RDW: 11.8 % (ref 11.7–15.4)
WBC: 5.4 10*3/uL (ref 3.4–10.8)

## 2019-12-14 LAB — LIPID PANEL WITH LDL/HDL RATIO
Cholesterol, Total: 197 mg/dL (ref 100–199)
HDL: 50 mg/dL (ref >39)
LDL Chol Calc (NIH): 111 mg/dL — ABNORMAL HIGH (ref 0–99)
LDL/HDL Ratio: 2.2 ratio (ref 0.0–3.2)
Triglycerides: 207 mg/dL — ABNORMAL HIGH (ref 0–149)
VLDL Cholesterol Cal: 36 mg/dL (ref 5–40)

## 2019-12-14 LAB — B12 AND FOLATE PANEL
Folate: >20 ng/mL (ref >3.0)
Vitamin B-12: >2000 pg/mL — ABNORMAL HIGH (ref 232–1245)

## 2019-12-14 LAB — ANA W/REFLEX IF POSITIVE: Anti Nuclear Antibody (ANA): NEGATIVE

## 2019-12-14 LAB — TSH: TSH: 1.65 u[IU]/mL (ref 0.450–4.500)

## 2019-12-14 LAB — CK TOTAL AND CKMB (NOT AT ARMC)
CK-MB Index: 1.9 ng/mL (ref 0.0–5.3)
Total CK: 59 U/L (ref 32–182)

## 2019-12-14 LAB — IRON,TIBC AND FERRITIN PANEL
Ferritin: 32 ng/mL (ref 15–150)
Iron Saturation: 28 % (ref 15–55)
Iron: 71 ug/dL (ref 27–159)
Total Iron Binding Capacity: 257 ug/dL (ref 250–450)
UIBC: 186 ug/dL (ref 131–425)

## 2019-12-14 LAB — URIC ACID: Uric Acid: 4.5 mg/dL (ref 3.0–7.2)

## 2019-12-14 LAB — SEDIMENTATION RATE: Sed Rate: 10 mm/hr (ref 0–40)

## 2019-12-18 ENCOUNTER — Encounter: Payer: Self-pay | Admitting: Internal Medicine

## 2019-12-19 DIAGNOSIS — M7541 Impingement syndrome of right shoulder: Secondary | ICD-10-CM | POA: Diagnosis not present

## 2019-12-19 DIAGNOSIS — M7542 Impingement syndrome of left shoulder: Secondary | ICD-10-CM | POA: Diagnosis not present

## 2019-12-21 ENCOUNTER — Other Ambulatory Visit: Payer: Self-pay

## 2019-12-21 ENCOUNTER — Ambulatory Visit (INDEPENDENT_AMBULATORY_CARE_PROVIDER_SITE_OTHER): Payer: Medicare Other | Admitting: Internal Medicine

## 2019-12-21 ENCOUNTER — Encounter: Payer: Self-pay | Admitting: Internal Medicine

## 2019-12-21 VITALS — BP 120/82 | HR 92 | Temp 98.3°F | Resp 16 | Ht 64.0 in | Wt 157.0 lb

## 2019-12-21 DIAGNOSIS — F3132 Bipolar disorder, current episode depressed, moderate: Secondary | ICD-10-CM

## 2019-12-21 DIAGNOSIS — G43709 Chronic migraine without aura, not intractable, without status migrainosus: Secondary | ICD-10-CM | POA: Diagnosis not present

## 2019-12-21 DIAGNOSIS — N289 Disorder of kidney and ureter, unspecified: Secondary | ICD-10-CM

## 2019-12-21 DIAGNOSIS — E119 Type 2 diabetes mellitus without complications: Secondary | ICD-10-CM

## 2019-12-21 DIAGNOSIS — R55 Syncope and collapse: Secondary | ICD-10-CM

## 2019-12-21 LAB — POCT GLYCOSYLATED HEMOGLOBIN (HGB A1C): Hemoglobin A1C: 5.7 % — AB (ref 4.0–5.6)

## 2019-12-21 NOTE — Progress Notes (Signed)
[facility] [address] [place], [place] [address]  Internal MEDICINE  Office Visit Note  Patient Name: Jennifer Bright  [id number]  [id number]  Date of Service: 12/25/2019  Chief Complaint  Patient presents with  . Anxiety  . Depression  . Hyperlipidemia  . Diabetes  . Follow-up    1 week  . Quality Metric Gaps    Pneumnovax    HPI Pt is wheeled in by her husband because she cannot walk. She is scared that she will pass out. Pt has syncopal episodes. She is being seen by a neurologist however around MAY she will need new one due to relocation of her previous neurologist. She was instructed to also talk to her Psychiatrist due to poly-pharmacy. Pt also gives h/o Narcolepsy but does not recall getting a MSLT. She has sarcoidosis but not on any treatment at the moment.  Discussion about her medications and risk of fall, Sz AND Syncopal episodes is dicussed with them All labs ( normal and abnormal) discussed as well    Current Medication: Outpatient Encounter Medications as of 12/21/2019  Medication Sig  . ALPRAZolam (XANAX) 1 MG tablet Take 1 mg by mouth 3 (three) times daily as needed for anxiety or sleep.  Marland Kitchen amphetamine-dextroamphetamine (ADDERALL XR) 30 MG 24 hr capsule Take 30 mg by mouth 2 (two) times daily.  . Armodafinil 250 MG tablet Take 250 mg by mouth daily.  Marland Kitchen atorvastatin (LIPITOR) 40 MG tablet Take 40 mg by mouth at bedtime.   . benztropine (COGENTIN) 1 MG tablet Take 1 mg by mouth 2 (two) times daily.   Marland Kitchen buPROPion (WELLBUTRIN XL) 300 MG 24 hr tablet Take 300 mg by mouth daily.  . Cholecalciferol (VITAMIN D3) 50 MCG (2000 UT) capsule Take 2,000 Units by mouth daily.  . Cyanocobalamin (B-12) 2500 MCG TABS Take 2,500 mcg by mouth daily.  [name] 100 MG CAPS Take by mouth.  Marland Kitchen HYDROcodone-acetaminophen (NORCO/VICODIN) 5-325 MG tablet Take 1 tablet by mouth every 6 (six) hours as needed for moderate pain. 1/2 to 1 full tablet twice daily  PRN.  Marland Kitchen levothyroxine (SYNTHROID, LEVOTHROID) 100 MCG tablet Take 100 mcg by mouth daily before breakfast.   . meclizine (ANTIVERT) 12.5 MG tablet Take 1 tablet (12.5 mg total) by mouth 3 (three) times daily as needed for dizziness.  . Melatonin 10 MG TABS Take 10 mg by mouth at bedtime.  . methocarbamol (ROBAXIN) 750 MG tablet Take 750 mg by mouth as needed for muscle spasms.  . Multiple Vitamin (MULTIVITAMIN) capsule Take by mouth.  . Omega-3 300 MG CAPS Take 1.2 g by mouth daily.   . Ospemifene (OSPHENA) 60 MG TABS Take 1 tablet by mouth daily.  . pantoprazole (PROTONIX) 40 MG tablet Take 40 mg by mouth daily.  Marland Kitchen topiramate (TOPAMAX) 200 MG tablet 300-400 mg See admin instructions. pt take 2 tabs (400 mg) q am, and 1.5 (300 mg) hs  . Vitamin E Acetate 125 UNIT/ML LIQD Take 670 Units by mouth daily.  . ziprasidone (GEODON) 60 MG capsule Take 180 mg by mouth every evening. After dinner   No facility-administered encounter medications on file as of 12/21/2019.    Surgical History: Past Surgical History:  Procedure Laterality Date  . ABDOMINAL HYSTERECTOMY    . APPENDECTOMY    . BLADDER SURGERY     bladder tact  . CATARACT EXTRACTION W/PHACO Right 09/21/2018   Procedure: CATARACT EXTRACTION PHACO AND INTRAOCULAR LENS PLACEMENT (IOC);  Surgeon:  Nevada Crane, MD;  Location: [facility] ORS;  Service: Ophthalmology;  Laterality: Right;  [place]  00:20 CDE 00.82 Fluid pack lot # [id number]  . CATARACT EXTRACTION W/PHACO Left 10/17/2018   Procedure: CATARACT EXTRACTION PHACO AND INTRAOCULAR LENS PLACEMENT (IOC)  LEFT;  Surgeon: [place] Crane, MD;  Location: [facility];  Service: Ophthalmology;  Laterality: Left;  diabetic - diet controlled  . CESAREAN SECTION    . CHOLECYSTECTOMY    . COLONOSCOPY WITH PROPOFOL N/A 04/05/2017   Procedure: COLONOSCOPY WITH PROPOFOL;  Surgeon: Christena Deem, MD;  Location: [facility] ENDOSCOPY;  Service: Endoscopy;  Laterality: N/A;  .  ESOPHAGOGASTRODUODENOSCOPY (EGD) WITH PROPOFOL N/A 08/28/2016   Procedure: ESOPHAGOGASTRODUODENOSCOPY (EGD) WITH PROPOFOL;  Surgeon: Christena Deem, MD;  Location: [facility] ENDOSCOPY;  Service: Endoscopy;  Laterality: N/A;  . HERNIA REPAIR    . INGUINAL HERNIA REPAIR     rt and left  . lexiscan cardiolite    . TILT TABLE STUDY N/A 04/01/2012   Procedure: TILT TABLE STUDY;  Surgeon: Duke Salvia, MD;  Location: [facility] CATH LAB;  Service: Cardiovascular;  Laterality: N/A;    Medical History: Past Medical History:  Diagnosis Date  . Acid reflux   . Anemia   . Anxiety   . Bipolar disorder (HCC)   . Cataract   . Chronic kidney disease    STAGE 3  . Depression   . Diabetes mellitus    NIDD  . Essential tremor   . Family history of breast cancer    doesn't meet medicare guidelines for cancer genetic testing.   . Family history of ovarian cancer    Pt doesn't meet Medicare genetic testing guidelines  . Hyperlipidemia   . Hypothyroid   . IBS (irritable bowel syndrome)   . Migraine headache    vestibular migraine  . Obesity   . Osteopenia   . Restless leg syndrome   . Sarcoidosis   . Sleep apnea   . Syncope \    Family History: Family History  Problem Relation Age of Onset  . Colon cancer Father   . Stomach cancer Father   . Hypertension Mother   . Hyperlipidemia Mother   . Stroke Mother   . Alcohol abuse Brother   . Irritable bowel syndrome Sister   . Breast cancer Paternal Aunt   . Ovarian cancer Paternal Aunt   . Cancer Cousin   . Cancer Cousin   . Ovarian cancer Cousin   . Kidney cancer Neg Hx   . Kidney disease Neg Hx   . Prostate cancer Neg Hx     Social History   Socioeconomic History  . Marital status: Married    Spouse name: Not on file  . Number of children: Not on file  . Years of education: Not on file  . Highest education level: Not on file  Occupational History  . Occupation: disabled  Tobacco Use  . Smoking status: Former Smoker    Types:  Cigarettes    Quit date: 2015    Years since quitting: 6.1  . Smokeless tobacco: Never Used  Substance and Sexual Activity  . Alcohol use: No  . Drug use: No  . Sexual activity: Yes  Other Topics Concern  . Not on file  Social History Narrative  . Not on file   Social Determinants of Health   Financial Resource Strain: Low Risk   . Difficulty of Paying Living Expenses: Not hard at all  Food Insecurity: No Food  Insecurity  . Worried About Charity fundraiser in the Last Year: Never true  . Ran Out of Food in the Last Year: Never true  Transportation Needs: No Transportation Needs  . Lack of Transportation (Medical): No  . Lack of Transportation (Non-Medical): No  Physical Activity: Inactive  . Days of Exercise per Week: 0 days  . Minutes of Exercise per Session: 0 min  Stress: Stress Concern Present  . Feeling of Stress : Very much  Social Connections: Somewhat Isolated  . Frequency of Communication with Friends and Family: Never  . Frequency of Social Gatherings with Friends and Family: Never  . Attends Religious Services: More than 4 times per year  . Active Member of Clubs or Organizations: No  . Attends Archivist Meetings: Never  . Marital Status: Married  Human resources officer Violence: Not At Risk  . Fear of Current or Ex-Partner: No  . Emotionally Abused: No  . Physically Abused: No  . Sexually Abused: No    Review of Systems  Constitutional: Positive for fatigue.  HENT: Negative.   Respiratory: Negative for cough and choking.   Cardiovascular: Negative.  Negative for chest pain and palpitations.  Gastrointestinal: Negative.   Musculoskeletal: Positive for back pain.  Allergic/Immunologic: Negative.   Neurological: Positive for dizziness, syncope and headaches.  Psychiatric/Behavioral: Positive for agitation and sleep disturbance. Negative for suicidal ideas. The patient is nervous/anxious.     Vital Signs: BP 120/82   Pulse 92   Temp 98.3 F (36.8  C)   Resp 16   Ht 5\' 4"  (1.626 m)   Wt 157 lb (71.2 kg)   SpO2 99%   BMI 26.95 kg/m    Physical Exam Constitutional:      Appearance: Normal appearance.  HENT:     Head: Normocephalic and atraumatic.  Cardiovascular:     Rate and Rhythm: Normal rate and regular rhythm.     Pulses: Normal pulses.     Heart sounds: Normal heart sounds.  Pulmonary:     Effort: Pulmonary effort is normal.     Breath sounds: Normal breath sounds.  Skin:    General: Skin is warm and dry.  Neurological:     General: No focal deficit present.     Mental Status: She is alert and oriented to person, place, and time.     Gait: Gait normal.  Psychiatric:     Comments: Anxious // agitated    Pt is in W/C  Assessment/Plan: 1. Diabetes mellitus without complication ([place]) Last hg 5.7 ( pt has been on prednisone in the past due to sarcoid)  - POCT HgB A1C - Microalbumin / creatinine urine ratio - Microalbumin, urine  2. Syncope and collapse Ongoing problem, suspect medicine side effect, Psych and neurology will need collaboration to modify her therapy. Pt was instructed to taper down her Topamax  - Ambulatory referral to Neurology - US Carotid Bilateral; Future - ECHOCARDIOGRAM COMPLETE; Future  3. Abnormal kidney function Per nephrology, will continue Enalapril  - Microalbumin / creatinine urine ratio  4. Bipolar affective disorder, currently depressed, moderate ([place]) Per Psych, pt is on multiple medications, Primidone was d/c on previous visit.   5. Chronic migraine without aura without status migrainosus, not intractable Pt needs a new neurologist, will like to go to [place]  - Ambulatory referral to Neurology  General Counseling: Loleta Chance understanding of the findings of todays visit and agrees with plan of treatment. I have discussed any further diagnostic evaluation that  may be needed or ordered today. We also reviewed her medications today. she has been encouraged to call the  office with any questions or concerns that should arise related to todays visit.  Orders Placed This Encounter  Procedures  . US Carotid Bilateral  . Microalbumin / creatinine urine ratio  . Microalbumin, urine  . Ambulatory referral to Neurology  . POCT HgB A1C  . ECHOCARDIOGRAM COMPLETE    Total time spent:35 Minutes Time spent includes review of chart, medications, test results, and follow up plan with the patient.   Dr Lyndon Code Internal medicine

## 2019-12-22 LAB — MICROALBUMIN, URINE: Microalbumin, Urine: 3.9 ug/mL

## 2019-12-26 ENCOUNTER — Ambulatory Visit: Payer: Medicare Other | Admitting: Internal Medicine

## 2020-01-01 ENCOUNTER — Encounter: Payer: Self-pay | Admitting: Nurse Practitioner

## 2020-01-01 ENCOUNTER — Ambulatory Visit: Payer: Self-pay | Admitting: Physician Assistant

## 2020-01-01 ENCOUNTER — Other Ambulatory Visit: Payer: Self-pay

## 2020-01-01 ENCOUNTER — Ambulatory Visit (INDEPENDENT_AMBULATORY_CARE_PROVIDER_SITE_OTHER): Payer: Medicare Other | Admitting: Nurse Practitioner

## 2020-01-01 VITALS — BP 111/66 | HR 106 | Temp 97.7°F | Resp 16 | Ht 64.0 in | Wt 163.2 lb

## 2020-01-01 DIAGNOSIS — K5909 Other constipation: Secondary | ICD-10-CM

## 2020-01-01 DIAGNOSIS — R300 Dysuria: Secondary | ICD-10-CM

## 2020-01-01 LAB — POCT URINALYSIS DIPSTICK
Bilirubin, UA: NEGATIVE
Blood, UA: NEGATIVE
Glucose, UA: NEGATIVE
Ketones, UA: NEGATIVE
Leukocytes, UA: NEGATIVE
Nitrite, UA: NEGATIVE
Protein, UA: NEGATIVE
Spec Grav, UA: 1.01 (ref 1.010–1.025)
Urobilinogen, UA: 0.2 E.U./dL
pH, UA: 7 (ref 5.0–8.0)

## 2020-01-01 MED ORDER — LINACLOTIDE 145 MCG PO CAPS: 145.0000 ug | ORAL_CAPSULE | Freq: Every day | ORAL | 3 refills | Status: DC

## 2020-01-01 NOTE — Progress Notes (Signed)
[facility] [address] [place], [place] [address]  Internal MEDICINE  Office Visit Note  Patient Name: Jennifer Bright  [id number]  [id number]  Date of Service: 01/01/2020   Pt is here for a sick visit.  Chief Complaint  Patient presents with  . Back Pain    low back pain   . Dysuria    urine in the mornings is very cloudy and white started 5-6 days ago, after that urine is dark yellow a few times, and then lighter yellow     The patient is here for acute visit. She states that she has been having dark and cloudy urine in the mornings. Will gradually improve as the day go on. She denies frequency or pain with urination. She states that she has had some bloating and constipation. This has been going on for about two weeks. She does have some abdominal discomfort associated with the constipation. Was on linzess in the past. Worked very well for her. She is no longer on this medication.  She does have some low back and shoulder pain. She is seeing orthopedics for this and has appointment with him tomorrow.        Current Medication:  Outpatient Encounter Medications as of 01/01/2020  Medication Sig  . ALPRAZolam (XANAX) 1 MG tablet Take 1 mg by mouth 3 (three) times daily as needed for anxiety or sleep.  Marland Kitchen amphetamine-dextroamphetamine (ADDERALL XR) 30 MG 24 hr capsule Take 30 mg by mouth 2 (two) times daily.  . Armodafinil 250 MG tablet Take 250 mg by mouth daily.  Marland Kitchen atorvastatin (LIPITOR) 40 MG tablet Take 40 mg by mouth at bedtime.   . benztropine (COGENTIN) 1 MG tablet Take 1 mg by mouth 2 (two) times daily.   Marland Kitchen buPROPion (WELLBUTRIN XL) 300 MG 24 hr tablet Take 300 mg by mouth daily.  . Cholecalciferol (VITAMIN D3) 50 MCG (2000 UT) capsule Take 2,000 Units by mouth daily.  . Cyanocobalamin (B-12) 2500 MCG TABS Take 2,500 mcg by mouth daily.  . cyclobenzaprine (FLEXERIL) 5 MG tablet Take 5 mg by mouth 3 (three) times daily as needed for muscle spasms.  [name] 100 MG CAPS Take by mouth.  Marland Kitchen HYDROcodone-acetaminophen (NORCO/VICODIN) 5-325 MG tablet Take 1 tablet by mouth every 6 (six) hours as needed for moderate pain. 1/2 to 1 full tablet twice daily PRN.  Marland Kitchen levothyroxine (SYNTHROID, LEVOTHROID) 100 MCG tablet Take 100 mcg by mouth daily before breakfast.   . meclizine (ANTIVERT) 12.5 MG tablet Take 1 tablet (12.5 mg total) by mouth 3 (three) times daily as needed for dizziness.  . Melatonin 10 MG TABS Take 10 mg by mouth at bedtime.  . methocarbamol (ROBAXIN) 750 MG tablet Take 750 mg by mouth as needed for muscle spasms.  . Multiple Vitamin (MULTIVITAMIN) capsule Take by mouth.  . Omega-3 300 MG CAPS Take 1.2 g by mouth daily.   . Ospemifene (OSPHENA) 60 MG TABS Take 1 tablet by mouth daily.  . pantoprazole (PROTONIX) 40 MG tablet Take 40 mg by mouth daily.  Marland Kitchen topiramate (TOPAMAX) 200 MG tablet 300-400 mg See admin instructions. pt take 2 tabs (400 mg) q am, and 1.5 (300 mg) hs  . traZODone (DESYREL) 50 MG tablet Take 50 mg by mouth in the morning, at noon, and at bedtime.  . Vitamin E Acetate 125 UNIT/ML LIQD Take 670 Units by mouth daily.  . ziprasidone (GEODON) 60 MG capsule Take 180 mg by mouth  every evening. After dinner   No facility-administered encounter medications on file as of 01/01/2020.      Medical History: Past Medical History:  Diagnosis Date  . Acid reflux   . Anemia   . Anxiety   . Bipolar disorder (HCC)   . Cataract   . Chronic kidney disease    STAGE 3  . Depression   . Diabetes mellitus    NIDD  . Essential tremor   . Family history of breast cancer    doesn't meet medicare guidelines for cancer genetic testing.   . Family history of ovarian cancer    Pt doesn't meet Medicare genetic testing guidelines  . Hyperlipidemia   . Hypothyroid   . IBS (irritable bowel syndrome)   . Migraine headache    vestibular migraine  . Obesity   . Osteopenia   . Restless leg syndrome   . Sarcoidosis    . Sleep apnea   . Syncope \     Vital Signs: Today's Vitals   01/01/20 1021  BP: 111/66  Pulse: (!) 106  Resp: 16  Temp: 97.7 F (36.5 C)  SpO2: 98%  Weight: 163 lb 3.2 oz (74 kg)  Height: 5\' 4"  (1.626 m)   Body mass index is 28.01 kg/m.  Review of Systems  Constitutional: Negative for chills, fatigue and unexpected weight change.  HENT: Negative for congestion, postnasal drip, rhinorrhea, sneezing and sore throat.   Respiratory: Negative for cough, chest tightness, shortness of breath and wheezing.   Cardiovascular: Negative for chest pain and palpitations.  Gastrointestinal: Positive for abdominal pain and constipation. Negative for diarrhea, nausea and vomiting.  Endocrine: Negative for cold intolerance, heat intolerance, polydipsia and polyuria.  Genitourinary: Positive for frequency and urgency.  Musculoskeletal: Positive for arthralgias, back pain and myalgias. Negative for joint swelling and neck pain.  Skin: Negative for rash.  Neurological: Positive for tremors. Negative for numbness.  Hematological: Negative for adenopathy. Does not bruise/bleed easily.  Psychiatric/Behavioral: Negative for behavioral problems (Depression), sleep disturbance and suicidal ideas. The patient is not nervous/anxious.     Physical Exam Vitals and nursing note reviewed.  Constitutional:      Appearance: Normal appearance.  HENT:     Head: Normocephalic and atraumatic.     Nose: Nose normal.  Eyes:     Pupils: Pupils are equal, round, and reactive to light.  Cardiovascular:     Rate and Rhythm: Normal rate and regular rhythm.     Heart sounds: Normal heart sounds.  Pulmonary:     Effort: Pulmonary effort is normal.     Breath sounds: Normal breath sounds.  Abdominal:     General: Bowel sounds are normal.     Palpations: Abdomen is soft.     Tenderness: There is no abdominal tenderness.  Genitourinary:    Comments: Urine sample negative for evidence of infection or other  abnormalities.  Musculoskeletal:     Cervical back: Normal range of motion. No tenderness.  Skin:    General: Skin is warm and dry.  Neurological:     General: No focal deficit present.     Mental Status: She is alert and oriented to person, place, and time. Mental status is at baseline.     Gait: Gait normal.  Psychiatric:        Behavior: Behavior normal.        Thought Content: Thought content normal.        Judgment: Judgment normal.   Assessment/Plan:  1. Dysuria -  POCT Urinalysis Dipstick negative for signs of infection or other abnormalities. Will monitor.   2. Chronic constipation Start linzess 168mcg daily. Samples provided today. New prescription sent to her pharmacy today.  - linaclotide (LINZESS) 145 MCG CAPS capsule; Take 1 capsule (145 mcg total) by mouth daily before breakfast.  Dispense: 30 capsule; Refill: 3   General Counseling: Jennifer Bright verbalizes understanding of the findings of todays visit and agrees with plan of treatment. I have discussed any further diagnostic evaluation that may be needed or ordered today. We also reviewed her medications today. she has been encouraged to call the office with any questions or concerns that should arise related to todays visit.    Counseling:  A high fiber diet with plenty of fluids (up to 8 glasses of water daily) is suggested to relieve these symptoms.  Metamucil, 1 tablespoon once or twice daily can be used to keep bowels regular if needed.  This patient was seen by Leretha Pol FNP Collaboration with Dr Lavera Guise as a part of collaborative care agreement  Orders Placed This Encounter  Procedures  . POCT Urinalysis Dipstick      Time spent: 30 Minutes

## 2020-01-02 DIAGNOSIS — M7541 Impingement syndrome of right shoulder: Secondary | ICD-10-CM | POA: Diagnosis not present

## 2020-01-02 DIAGNOSIS — M7542 Impingement syndrome of left shoulder: Secondary | ICD-10-CM | POA: Diagnosis not present

## 2020-01-04 DIAGNOSIS — G43709 Chronic migraine without aura, not intractable, without status migrainosus: Secondary | ICD-10-CM | POA: Diagnosis not present

## 2020-01-05 ENCOUNTER — Other Ambulatory Visit: Payer: Medicare Other

## 2020-01-05 DIAGNOSIS — M7542 Impingement syndrome of left shoulder: Secondary | ICD-10-CM | POA: Diagnosis not present

## 2020-01-10 DIAGNOSIS — H26493 Other secondary cataract, bilateral: Secondary | ICD-10-CM | POA: Diagnosis not present

## 2020-01-11 ENCOUNTER — Other Ambulatory Visit: Payer: Self-pay | Admitting: Internal Medicine

## 2020-01-12 ENCOUNTER — Other Ambulatory Visit: Payer: Self-pay

## 2020-01-12 ENCOUNTER — Ambulatory Visit: Payer: Medicare Other

## 2020-01-12 DIAGNOSIS — M7541 Impingement syndrome of right shoulder: Secondary | ICD-10-CM | POA: Diagnosis not present

## 2020-01-12 DIAGNOSIS — R55 Syncope and collapse: Secondary | ICD-10-CM

## 2020-01-12 DIAGNOSIS — G47419 Narcolepsy without cataplexy: Secondary | ICD-10-CM | POA: Diagnosis not present

## 2020-01-12 DIAGNOSIS — Z9181 History of falling: Secondary | ICD-10-CM | POA: Diagnosis not present

## 2020-01-12 DIAGNOSIS — M19012 Primary osteoarthritis, left shoulder: Secondary | ICD-10-CM | POA: Diagnosis not present

## 2020-01-12 DIAGNOSIS — M542 Cervicalgia: Secondary | ICD-10-CM | POA: Diagnosis not present

## 2020-01-12 DIAGNOSIS — M7542 Impingement syndrome of left shoulder: Secondary | ICD-10-CM | POA: Diagnosis not present

## 2020-01-12 DIAGNOSIS — M19011 Primary osteoarthritis, right shoulder: Secondary | ICD-10-CM | POA: Diagnosis not present

## 2020-01-17 DIAGNOSIS — Z9181 History of falling: Secondary | ICD-10-CM | POA: Diagnosis not present

## 2020-01-17 DIAGNOSIS — M542 Cervicalgia: Secondary | ICD-10-CM | POA: Diagnosis not present

## 2020-01-17 DIAGNOSIS — M19011 Primary osteoarthritis, right shoulder: Secondary | ICD-10-CM | POA: Diagnosis not present

## 2020-01-17 DIAGNOSIS — M7541 Impingement syndrome of right shoulder: Secondary | ICD-10-CM | POA: Diagnosis not present

## 2020-01-17 DIAGNOSIS — M19012 Primary osteoarthritis, left shoulder: Secondary | ICD-10-CM | POA: Diagnosis not present

## 2020-01-17 DIAGNOSIS — G47419 Narcolepsy without cataplexy: Secondary | ICD-10-CM | POA: Diagnosis not present

## 2020-01-17 DIAGNOSIS — M7542 Impingement syndrome of left shoulder: Secondary | ICD-10-CM | POA: Diagnosis not present

## 2020-01-19 DIAGNOSIS — Z9181 History of falling: Secondary | ICD-10-CM | POA: Diagnosis not present

## 2020-01-19 DIAGNOSIS — M19012 Primary osteoarthritis, left shoulder: Secondary | ICD-10-CM | POA: Diagnosis not present

## 2020-01-19 DIAGNOSIS — M7542 Impingement syndrome of left shoulder: Secondary | ICD-10-CM | POA: Diagnosis not present

## 2020-01-19 DIAGNOSIS — M542 Cervicalgia: Secondary | ICD-10-CM | POA: Diagnosis not present

## 2020-01-19 DIAGNOSIS — M19011 Primary osteoarthritis, right shoulder: Secondary | ICD-10-CM | POA: Diagnosis not present

## 2020-01-19 DIAGNOSIS — M7541 Impingement syndrome of right shoulder: Secondary | ICD-10-CM | POA: Diagnosis not present

## 2020-01-19 DIAGNOSIS — G47419 Narcolepsy without cataplexy: Secondary | ICD-10-CM | POA: Diagnosis not present

## 2020-01-22 DIAGNOSIS — M542 Cervicalgia: Secondary | ICD-10-CM | POA: Diagnosis not present

## 2020-01-22 DIAGNOSIS — G47419 Narcolepsy without cataplexy: Secondary | ICD-10-CM | POA: Diagnosis not present

## 2020-01-22 DIAGNOSIS — M7541 Impingement syndrome of right shoulder: Secondary | ICD-10-CM | POA: Diagnosis not present

## 2020-01-22 DIAGNOSIS — M19011 Primary osteoarthritis, right shoulder: Secondary | ICD-10-CM | POA: Diagnosis not present

## 2020-01-22 DIAGNOSIS — M19012 Primary osteoarthritis, left shoulder: Secondary | ICD-10-CM | POA: Diagnosis not present

## 2020-01-22 DIAGNOSIS — M7542 Impingement syndrome of left shoulder: Secondary | ICD-10-CM | POA: Diagnosis not present

## 2020-01-22 DIAGNOSIS — Z9181 History of falling: Secondary | ICD-10-CM | POA: Diagnosis not present

## 2020-01-30 ENCOUNTER — Ambulatory Visit: Payer: Medicare Other | Admitting: Internal Medicine

## 2020-02-05 DIAGNOSIS — M25519 Pain in unspecified shoulder: Secondary | ICD-10-CM | POA: Diagnosis not present

## 2020-02-07 DIAGNOSIS — G47419 Narcolepsy without cataplexy: Secondary | ICD-10-CM | POA: Diagnosis not present

## 2020-02-07 DIAGNOSIS — M19011 Primary osteoarthritis, right shoulder: Secondary | ICD-10-CM | POA: Diagnosis not present

## 2020-02-07 DIAGNOSIS — M7542 Impingement syndrome of left shoulder: Secondary | ICD-10-CM | POA: Diagnosis not present

## 2020-02-07 DIAGNOSIS — M542 Cervicalgia: Secondary | ICD-10-CM | POA: Diagnosis not present

## 2020-02-07 DIAGNOSIS — M19012 Primary osteoarthritis, left shoulder: Secondary | ICD-10-CM | POA: Diagnosis not present

## 2020-02-07 DIAGNOSIS — M7541 Impingement syndrome of right shoulder: Secondary | ICD-10-CM | POA: Diagnosis not present

## 2020-02-07 DIAGNOSIS — Z9181 History of falling: Secondary | ICD-10-CM | POA: Diagnosis not present

## 2020-02-12 DIAGNOSIS — M542 Cervicalgia: Secondary | ICD-10-CM | POA: Diagnosis not present

## 2020-02-12 DIAGNOSIS — M25511 Pain in right shoulder: Secondary | ICD-10-CM | POA: Diagnosis not present

## 2020-02-15 ENCOUNTER — Telehealth: Payer: Self-pay

## 2020-02-15 NOTE — Telephone Encounter (Signed)
CONFIRMED PATIENT ULTRASOUND APPT FOR 02/16/20 °

## 2020-02-16 ENCOUNTER — Ambulatory Visit: Payer: Medicare Other

## 2020-02-16 ENCOUNTER — Other Ambulatory Visit: Payer: Self-pay

## 2020-02-16 ENCOUNTER — Telehealth: Payer: Self-pay

## 2020-02-16 DIAGNOSIS — R55 Syncope and collapse: Secondary | ICD-10-CM | POA: Diagnosis not present

## 2020-02-16 NOTE — Telephone Encounter (Signed)
Confirmed and screened for 02-20-20 ov. 

## 2020-02-19 ENCOUNTER — Other Ambulatory Visit: Payer: Self-pay

## 2020-02-20 ENCOUNTER — Encounter: Payer: Self-pay | Admitting: Internal Medicine

## 2020-02-20 ENCOUNTER — Ambulatory Visit (INDEPENDENT_AMBULATORY_CARE_PROVIDER_SITE_OTHER): Payer: Medicare Other | Admitting: Internal Medicine

## 2020-02-20 ENCOUNTER — Telehealth: Payer: Self-pay

## 2020-02-20 ENCOUNTER — Other Ambulatory Visit: Payer: Self-pay

## 2020-02-20 VITALS — BP 132/80 | HR 119 | Temp 97.9°F | Resp 16 | Ht 64.0 in | Wt 169.0 lb

## 2020-02-20 DIAGNOSIS — K5909 Other constipation: Secondary | ICD-10-CM

## 2020-02-20 DIAGNOSIS — Z7689 Persons encountering health services in other specified circumstances: Secondary | ICD-10-CM

## 2020-02-20 DIAGNOSIS — Z0001 Encounter for general adult medical examination with abnormal findings: Secondary | ICD-10-CM | POA: Diagnosis not present

## 2020-02-20 DIAGNOSIS — D869 Sarcoidosis, unspecified: Secondary | ICD-10-CM | POA: Diagnosis not present

## 2020-02-20 DIAGNOSIS — F3132 Bipolar disorder, current episode depressed, moderate: Secondary | ICD-10-CM | POA: Diagnosis not present

## 2020-02-20 DIAGNOSIS — R55 Syncope and collapse: Secondary | ICD-10-CM

## 2020-02-20 DIAGNOSIS — G47419 Narcolepsy without cataplexy: Secondary | ICD-10-CM

## 2020-02-20 DIAGNOSIS — G250 Essential tremor: Secondary | ICD-10-CM

## 2020-02-20 NOTE — Telephone Encounter (Signed)
Called [employer] for electric wheelchair order

## 2020-02-20 NOTE — Progress Notes (Signed)
[facility] [place], [place] [address]  Internal MEDICINE  Office Visit Note  Patient Name: Jennifer Bright  [id number]  [id number]  Date of Service: 02/24/2020  Chief Complaint  Patient presents with  . Annual Exam    electric wheelchair   . Diabetes  . Depression  . Hyperlipidemia  . Gastroesophageal Reflux  . Quality Metric Gaps    covid vaccine ,pneumonvax   . Medication Refill    toprimate   . Follow-up    u/s  result      HPI Pt is here for routine health maintenance examination, she has an extensive complicated medical history. She is followed routinely by psychiatry as well as neurology. High risk for polypharmacy. Has been successful in establishing care with a new neurologist that is associated with [facility]. Her husband accompanied her at today's visit to help with communication as well as support as Libbey is wheelchair dependent. At last visit, she was complaining of urinary symptoms as well as constipation. Her urinary symptoms have resolved, she continues to work on increasing her water intake throughout the day. Linzess samples and a new prescription was given at last visit for chronic constipation, this has seemed to improve symptoms. No complaints of abdominal pain, distention or bloating. Complains today of increased tremors in bilateral upper extremities, feels as though this is related to the decrease in her Primidone dose, would like to go back to her original dose. Also complains today of nasal dryness that has been ongoing for 6 months. At times the dryness causes her to have sores inside of her nose. Likely a symptoms related to her sarcoidosis. Has tried nasal saline flushed that dose not provide relief. Has plans to schedule her initial COVID19 vaccine.  Current Medication: Outpatient Encounter Medications as of 02/20/2020  Medication Sig  . ALPRAZolam (XANAX) 1 MG tablet Take 1 mg by mouth 2 (two) times daily as needed for  anxiety or sleep.   Marland Kitchen ALPRAZOLAM XR 1 MG 24 hr tablet Take 1 mg by mouth 2 (two) times daily.  Marland Kitchen amphetamine-dextroamphetamine (ADDERALL XR) 30 MG 24 hr capsule Take 30 mg by mouth 2 (two) times daily.  . Armodafinil 250 MG tablet Take 250 mg by mouth daily.  Marland Kitchen atorvastatin (LIPITOR) 40 MG tablet Take 40 mg by mouth at bedtime.   . benztropine (COGENTIN) 1 MG tablet Take 1 mg by mouth 2 (two) times daily.   Marland Kitchen buPROPion (WELLBUTRIN XL) 300 MG 24 hr tablet Take 300 mg by mouth daily.  . Cholecalciferol (VITAMIN D3) 50 MCG (2000 UT) capsule Take 2,000 Units by mouth daily.  . Cyanocobalamin (B-12) 2500 MCG TABS Take 2,500 mcg by mouth daily.  . cyclobenzaprine (FLEXERIL) 5 MG tablet Take 5 mg by mouth 3 (three) times daily as needed for muscle spasms.  Mariane Baumgarten Sodium (DSS) 100 MG CAPS Take by mouth.  Marland Kitchen HYDROcodone-acetaminophen (NORCO/VICODIN) 5-325 MG tablet Take 1 tablet by mouth every 6 (six) hours as needed for moderate pain. 1/2 to 1 full tablet twice daily PRN.  Marland Kitchen levothyroxine (SYNTHROID) 100 MCG tablet TAKE 1 TABLET EVERY DAY ON EMPTY STOMACHWITH A GLASS OF WATER AT LEAST 30-60 MINBEFORE BREAKFAST  . linaclotide (LINZESS) 145 MCG CAPS capsule Take 1 capsule (145 mcg total) by mouth daily before breakfast.  . meclizine (ANTIVERT) 12.5 MG tablet Take 1 tablet (12.5 mg total) by mouth 3 (three) times daily as needed for dizziness.  . Melatonin 10 MG TABS Take 10 mg  by mouth at bedtime.  . methocarbamol (ROBAXIN) 750 MG tablet Take 750 mg by mouth as needed for muscle spasms.  . Multiple Vitamin (MULTIVITAMIN) capsule Take by mouth.  . Omega-3 300 MG CAPS Take 1.2 g by mouth daily.   . Ospemifene (OSPHENA) 60 MG TABS Take 1 tablet by mouth daily.  . pantoprazole (PROTONIX) 40 MG tablet TAKE ONE TABLET EVERY DAY 15-20 MINUTES BEFORE A MEAL  . traZODone (DESYREL) 50 MG tablet Take 50 mg by mouth in the morning, at noon, and at bedtime.  . Vitamin E Acetate 125 UNIT/ML LIQD Take 670 Units by  mouth daily.  . ziprasidone (GEODON) 60 MG capsule Take 180 mg by mouth every evening. After dinner  . [DISCONTINUED] topiramate (TOPAMAX) 200 MG tablet 300-400 mg See admin instructions. pt take 2 tabs (400 mg) q am, and 1.5 (300 mg) hs  . traMADol (ULTRAM) 50 MG tablet tramadol 50 mg tablet  Take 1 tablet every 6 hours by oral route as needed.   No facility-administered encounter medications on file as of 02/20/2020.    Surgical History: Past Surgical History:  Procedure Laterality Date  . ABDOMINAL HYSTERECTOMY    . APPENDECTOMY    . BLADDER SURGERY     bladder tact  . CATARACT EXTRACTION W/PHACO Right 09/21/2018   Procedure: CATARACT EXTRACTION PHACO AND INTRAOCULAR LENS PLACEMENT ([place]);  Surgeon: [name], MD;  Location: [facility] ORS;  Service: Ophthalmology;  Laterality: Right;  [place]  00:20 CDE 00.82 Fluid pack lot # [id number]  . CATARACT EXTRACTION W/PHACO Left 10/17/2018   Procedure: CATARACT EXTRACTION PHACO AND INTRAOCULAR LENS PLACEMENT ([place])  LEFT;  Surgeon: [name], MD;  Location: [facility];  Service: Ophthalmology;  Laterality: Left;  diabetic - diet controlled  . CESAREAN SECTION    . CHOLECYSTECTOMY    . COLONOSCOPY WITH PROPOFOL N/A 04/05/2017   Procedure: COLONOSCOPY WITH PROPOFOL;  Surgeon: [name], MD;  Location: [facility] ENDOSCOPY;  Service: Endoscopy;  Laterality: N/A;  . ESOPHAGOGASTRODUODENOSCOPY (EGD) WITH PROPOFOL N/A 08/28/2016   Procedure: ESOPHAGOGASTRODUODENOSCOPY (EGD) WITH PROPOFOL;  Surgeon: [name], MD;  Location: [facility] ENDOSCOPY;  Service: Endoscopy;  Laterality: N/A;  . HERNIA REPAIR    . INGUINAL HERNIA REPAIR     rt and left  . lexiscan cardiolite    . TILT TABLE STUDY N/A 04/01/2012   Procedure: TILT TABLE STUDY;  Surgeon: Deboraha Sprang, MD;  Location: [facility] CATH LAB;  Service: Cardiovascular;  Laterality: N/A;    Medical History: Past Medical History:  Diagnosis Date  . Acid reflux   . Anemia   . Anxiety    . Bipolar disorder ([place])   . Cataract   . Chronic kidney disease    STAGE 3  . Depression   . Diabetes mellitus    NIDD  . Essential tremor   . Family history of breast cancer    doesn't meet medicare guidelines for cancer genetic testing.   . Family history of ovarian cancer    Pt doesn't meet Medicare genetic testing guidelines  . Hyperlipidemia   . Hypothyroid   . IBS (irritable bowel syndrome)   . Migraine headache    vestibular migraine  . Obesity   . Osteopenia   . Restless leg syndrome   . Sarcoidosis   . Sleep apnea   . Syncope \    Family History: Family History  Problem Relation Age of Onset  . Colon cancer Father   . Stomach  cancer Father   . Hypertension Mother   . Hyperlipidemia Mother   . Stroke Mother   . Alcohol abuse Brother   . Irritable bowel syndrome Sister   . Breast cancer Paternal Aunt   . Ovarian cancer Paternal Aunt   . Cancer Cousin   . Cancer Cousin   . Ovarian cancer Cousin   . Kidney cancer Neg Hx   . Kidney disease Neg Hx   . Prostate cancer Neg Hx     Review of Systems  Constitutional: Positive for fatigue. Negative for chills and diaphoresis.  HENT: Negative for ear pain, postnasal drip and sinus pressure.        Nasal dryness  Eyes: Negative for photophobia, discharge, redness, itching and visual disturbance.  Respiratory: Negative for cough, shortness of breath and wheezing.        Pleuritic chest pain  Cardiovascular: Negative for chest pain, palpitations and leg swelling.  Gastrointestinal: Negative for abdominal pain, constipation, diarrhea, nausea and vomiting.  Genitourinary: Negative for dysuria and flank pain.  Musculoskeletal: Positive for myalgias and neck pain. Negative for arthralgias, back pain and gait problem.  Skin: Negative for color change.  Allergic/Immunologic: Negative for environmental allergies and food allergies.  Neurological: Positive for tremors, syncope, speech difficulty and weakness. Negative  for dizziness and headaches.       Stuttering speech  Hematological: Does not bruise/bleed easily.  Psychiatric/Behavioral: Positive for hallucinations and sleep disturbance. Negative for agitation and behavioral problems (depression). The patient is nervous/anxious.    Vital Signs: BP 132/80   Pulse (!) 119   Temp 97.9 F (36.6 C)   Resp 16   Ht _0  (1.626 m)   Wt 169 lb (76.7 kg)   SpO2 98%   BMI 29.01 kg/m    Physical Exam Constitutional:      General: She is not in acute distress.    Appearance: She is well-developed. She is not diaphoretic.  HENT:     Head: Normocephalic and atraumatic.     Right Ear: External ear normal.     Left Ear: External ear normal.     Nose: Nose normal.     Right Turbinates: Swollen.     Left Turbinates: Swollen.     Comments: Bilateral nasal turbinates red and swollen    Mouth/Throat:     Pharynx: No oropharyngeal exudate.  Eyes:     General: No scleral icterus.       Right eye: No discharge.        Left eye: No discharge.     Conjunctiva/sclera: Conjunctivae normal.     Pupils: Pupils are equal, round, and reactive to light.  Neck:     Thyroid: No thyromegaly.     Vascular: No JVD.     Trachea: No tracheal deviation.  Cardiovascular:     Rate and Rhythm: Regular rhythm. Tachycardia present.     Heart sounds: Normal heart sounds. No murmur. No friction rub. No gallop.   Pulmonary:     Effort: Pulmonary effort is normal. No respiratory distress.     Breath sounds: Normal breath sounds. No stridor. No wheezing or rales.  Chest:     Chest wall: No tenderness.  Abdominal:     General: Bowel sounds are normal. There is no distension.     Palpations: Abdomen is soft. There is no mass.     Tenderness: There is no abdominal tenderness. There is no guarding or rebound.  Musculoskeletal:  General: No tenderness or deformity. Normal range of motion.     Cervical back: Normal range of motion and neck supple. Pain with movement  present.  Lymphadenopathy:     Cervical: No cervical adenopathy.  Skin:    General: Skin is warm and dry.     Coloration: Skin is not pale.     Findings: No erythema or rash.  Neurological:     Mental Status: She is alert.     Cranial Nerves: No cranial nerve deficit.     Motor: No abnormal muscle tone.     Coordination: Coordination normal.     Deep Tendon Reflexes: Reflexes are normal and symmetric.  Psychiatric:        Mood and Affect: Mood is anxious.        Speech: Speech is rapid and pressured.        Behavior: Behavior normal.        Thought Content: Thought content normal.        Cognition and Memory: Memory is impaired.        Judgment: Judgment normal.      LABS: Recent Results (from the past 2160 hour(s))  CBC with Differential/Platelet     Status: None   Collection Time: 12/13/19  7:09 AM  Result Value Ref Range   WBC 5.4 3.4 - 10.8 x10E3/uL   RBC 4.58 3.77 - 5.28 x10E6/uL   Hemoglobin 14.0 11.1 - 15.9 g/dL   Hematocrit 40.9 34.0 - 46.6 %   MCV 89 79 - 97 fL   MCH 30.6 26.6 - 33.0 pg   MCHC 34.2 31.5 - 35.7 g/dL   RDW 11.8 11.7 - 15.4 %   Platelets 206 150 - 450 x10E3/uL   Neutrophils 46 Not Estab. %   Lymphs 39 Not Estab. %   Monocytes 7 Not Estab. %   Eos 6 Not Estab. %   Basos 2 Not Estab. %   Neutrophils Absolute 2.4 1.4 - 7.0 x10E3/uL   Lymphocytes Absolute 2.1 0.7 - 3.1 x10E3/uL   Monocytes Absolute 0.4 0.1 - 0.9 x10E3/uL   EOS (ABSOLUTE) 0.3 0.0 - 0.4 x10E3/uL   Basophils Absolute 0.1 0.0 - 0.2 x10E3/uL   Immature Granulocytes 0 Not Estab. %   Immature Grans (Abs) 0.0 0.0 - 0.1 x10E3/uL  Lipid Panel With LDL/HDL Ratio     Status: Abnormal   Collection Time: 12/13/19  7:09 AM  Result Value Ref Range   Cholesterol, Total 197 100 - 199 mg/dL   Triglycerides 207 (H) 0 - 149 mg/dL   HDL 50 >39 mg/dL   VLDL Cholesterol Cal 36 5 - 40 mg/dL   LDL Chol Calc (NIH) 111 (H) 0 - 99 mg/dL   LDL/HDL Ratio 2.2 0.0 - 3.2 ratio    Comment:                                      LDL/HDL Ratio                                             Men  Women                               1/2 Avg.Risk  1.0  1.5                                   Avg.Risk  3.6    3.2                                2X Avg.Risk  6.2    5.0                                3X Avg.Risk  8.0    6.1   TSH     Status: None   Collection Time: 12/13/19  7:09 AM  Result Value Ref Range   TSH 1.650 0.450 - 4.500 uIU/mL  T4, free     Status: None   Collection Time: 12/13/19  7:09 AM  Result Value Ref Range   Free T4 0.95 0.82 - 1.77 ng/dL  Comprehensive metabolic panel     Status: Abnormal   Collection Time: 12/13/19  7:09 AM  Result Value Ref Range   Glucose 139 (H) 65 - 99 mg/dL   BUN 24 6 - 24 mg/dL   Creatinine, Ser 1.05 (H) 0.57 - 1.00 mg/dL   GFR calc non Af Amer 61 >59 mL/min/1.73   GFR calc Af Amer 71 >59 mL/min/1.73   BUN/Creatinine Ratio 23 9 - 23   Sodium 140 134 - 144 mmol/L   Potassium 4.1 3.5 - 5.2 mmol/L   Chloride 107 (H) 96 - 106 mmol/L   CO2 22 20 - 29 mmol/L   Calcium 9.6 8.7 - 10.2 mg/dL   Total Protein 6.2 6.0 - 8.5 g/dL   Albumin 4.5 3.8 - 4.9 g/dL   Globulin, Total 1.7 1.5 - 4.5 g/dL   Albumin/Globulin Ratio 2.6 (H) 1.2 - 2.2   Bilirubin Total <0.2 0.0 - 1.2 mg/dL   Alkaline Phosphatase 57 39 - 117 IU/L   AST 15 0 - 40 IU/L   ALT 24 0 - 32 IU/L  B12 and Folate Panel     Status: Abnormal   Collection Time: 12/13/19  7:09 AM  Result Value Ref Range   Vitamin B-12 >2000 (H) 232 - 1245 pg/mL   Folate >20.0 >3.0 ng/mL    Comment: A serum folate concentration of less than 3.1 ng/mL is considered to represent clinical deficiency.   Fe+TIBC+Fer     Status: None   Collection Time: 12/13/19  7:09 AM  Result Value Ref Range   Total Iron Binding Capacity 257 250 - 450 ug/dL   UIBC 186 131 - 425 ug/dL   Iron 71 27 - 159 ug/dL   Iron Saturation 28 15 - 55 %   Ferritin 32 15 - 150 ng/mL  Sed Rate (ESR)     Status: None   Collection Time: 12/13/19  7:09 AM   Result Value Ref Range   Sed Rate 10 0 - 40 mm/hr  Uric acid     Status: None   Collection Time: 12/13/19  7:09 AM  Result Value Ref Range   Uric Acid 4.5 3.0 - 7.2 mg/dL    Comment:            Therapeutic target for gout patients: <6.0  CK total and CKMB (cardiac)not at [facility]     Status: None   Collection Time: 12/13/19  7:09 AM  Result Value Ref Range   Total CK 59 32 - 182 U/L   CK-MB Index 1.9 0.0 - 5.3 ng/mL  ANA w/Reflex if Positive     Status: None   Collection Time: 12/13/19  7:09 AM  Result Value Ref Range   Anti Nuclear Antibody (ANA) Negative Negative  Microalbumin, urine     Status: None   Collection Time: 12/21/19 12:00 AM  Result Value Ref Range   Microalbumin, Urine 3.9 Not Estab. ug/mL  POCT HgB A1C     Status: Abnormal   Collection Time: 12/21/19 11:15 AM  Result Value Ref Range   Hemoglobin A1C 5.7 (A) 4.0 - 5.6 %   HbA1c POC (<> result, manual entry)     HbA1c, POC (prediabetic range)     HbA1c, POC (controlled diabetic range)    POCT Urinalysis Dipstick     Status: None   Collection Time: 01/01/20 10:28 AM  Result Value Ref Range   Color, UA     Clarity, UA     Glucose, UA Negative Negative   Bilirubin, UA negative    Ketones, UA negative    Spec Grav, UA 1.010 1.010 - 1.025   Blood, UA negative    pH, UA 7.0 5.0 - 8.0   Protein, UA Negative Negative   Urobilinogen, UA 0.2 0.2 or 1.0 E.U./dL   Nitrite, UA negative    Leukocytes, UA Negative Negative   Appearance     Odor      Assessment/Plan: 1. Encounter for general adult medical examination with abnormal findings Well appearing 54 year old with multiple chronic medical conditions. High risk for polypharmacy. She is up to date on PHM. Plans to schedule her first COVID19 vaccination this week. Will discuss pneumonia vaccination at next visit once COVID19 vaccines have been administered.  2. Bipolar affective disorder, currently depressed, moderate ([place]) Closely followed by psychiatry who  prescribes all of her medications. Complaints of auditory and visual hallucinations, was recently started on Alprazolam 1 mg BID to help sleep at night due to these hallucinations. Will closely monitor and appreciate psychiatry input.  3. Sarcoidosis Stable at this time. Complaints of nasal dryness and sores likely related to this diagnosis. Instructed to continue with nasal saline flushed and to apply petroleum jelly to inside of nose twice daily. If no symptom improvement will discuss need for ENT referral.   4. Syncope and collapse Continues to have syncopal episodes 2-3 times per week. Multi-factorial in origin. Remains in wheelchair for ambulation to avoid injury. Has had multiple falls in the past. - DME Wheelchair electric  5. Chronic constipation Stable at this time, continue Linzess at this time.  6. Encounter for wheelchair assessment Face to face encounter today to discuss need for electric wheelchair. Patient has had numerous falls due to her chronic medical conditions. Suffers from narcolepsy which increases her fall risk. 2-3 syncopal episodes per week which she has fallen many times due to this condition. Remains in non-electric wheelchair at this time. Due to her carpal tunnel, tremors and chronic pain she is unable to wheel herself, must rely on her husband. 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  8. Benign essential tremor Continue with prescribed dose of Primidone. Attempted to decrease dose due to polypharmacy risk, tremors worsened with decreased dose. Agree to go back to original dose to help with tremors. - DME Wheelchair electric   General Counseling: yamina lenis understanding  of the findings of todays visit and agrees with plan of treatment. I have discussed any further diagnostic evaluation that may be needed or ordered today. We also reviewed her medications today. she  has been encouraged to call the office with any questions or concerns that should arise related to todays visit.     Durable Medical Equipment  (From admission, onward)         Start     Ordered   02/20/20 0000  DME Wheelchair electric     02/20/20 0959          Orders Placed This Encounter  Procedures  . DME Wheelchair electric   Total time spent: 30 Minutes  Time spent includes review of chart, medications, test results, and follow up plan with the patient.     Lavera Guise, MD  Internal Medicine

## 2020-02-21 ENCOUNTER — Other Ambulatory Visit: Payer: Self-pay

## 2020-02-21 MED ORDER — TOPIRAMATE 200 MG PO TABS: ORAL_TABLET | ORAL | 0 refills | Status: DC

## 2020-02-25 NOTE — Procedures (Signed)
[facility] [address] [place], [place] [address]  DATE OF SERVICE: February 16, 2020  CAROTID DOPPLER INTERPRETATION:  Bilateral Carotid Ultrsasound and Color Doppler Examination was performed. The RIGHT CCA shows no significant plaque in the vessel. The LEFT CCA shows no significant plaque in the vessel. There was no significant intimal thickening noted in the RIGHT carotid artery. There was no significant intimal thickening in the LEFT carotid artery.  The RIGHT CCA shows peak systolic velocity of 64 cm per second. The end diastolic velocity is 20 cm per second on the RIGHT side. The RIGHT ICA shows peak systolic velocity of 82 per second. RIGHT sided ICA end diastolic velocity is 36 cm per second. The RIGHT ECA shows a peak systolic velocity of 56 cm per second. The ICA/CCA ratio is calculated to be 1.3. This suggests no significant stenosis. The Vertebral Artery shows antegrade flow.  The LEFT CCA shows peak systolic velocity of 79 cm per second. The end diastolic velocity is 21 cm per second on the LEFT side. The LEFT ICA shows peak systolic velocity of 96 per second. LEFT sided ICA end diastolic velocity is 41 cm per second. The LEFT ECA shows a peak systolic velocity of 56 cm per second. The ICA/CCA ratio is calculated to be 1.2. This suggests no significant stenosis. The Vertebral Artery shows antegrade flow.   Impression:    The RIGHT CAROTID shows no significant stenosis in the vessel. The LEFT CAROTID shows no significant stenosis in the vessel.  There is no significant plaque formation noted on the LEFT and no significant plaque formation on the RIGHT  side. Consider a repeat Carotid doppler if clinical situation and symptoms warrant in 6-12 months. Patient should be encouraged to change lifestyles such as smoking cessation, regular exercise and dietary modification. Use of statins in the right clinical setting and ASA is encouraged.  Yevonne Pax, MD [facility] Pulmonary  Critical Care Medicine

## 2020-02-27 ENCOUNTER — Ambulatory Visit: Payer: Medicare Other | Admitting: Internal Medicine

## 2020-03-01 DIAGNOSIS — G47419 Narcolepsy without cataplexy: Secondary | ICD-10-CM | POA: Diagnosis not present

## 2020-03-01 DIAGNOSIS — Z79899 Other long term (current) drug therapy: Secondary | ICD-10-CM | POA: Diagnosis not present

## 2020-03-01 DIAGNOSIS — G43709 Chronic migraine without aura, not intractable, without status migrainosus: Secondary | ICD-10-CM | POA: Diagnosis not present

## 2020-03-12 ENCOUNTER — Ambulatory Visit (INDEPENDENT_AMBULATORY_CARE_PROVIDER_SITE_OTHER): Payer: Medicare Other | Admitting: Internal Medicine

## 2020-03-12 ENCOUNTER — Encounter: Payer: Self-pay | Admitting: Internal Medicine

## 2020-03-12 ENCOUNTER — Other Ambulatory Visit: Payer: Self-pay

## 2020-03-12 VITALS — BP 102/69 | HR 87 | Temp 97.7°F | Resp 16 | Wt 170.0 lb

## 2020-03-12 DIAGNOSIS — R091 Pleurisy: Secondary | ICD-10-CM

## 2020-03-12 DIAGNOSIS — D8686 Sarcoid arthropathy: Secondary | ICD-10-CM | POA: Diagnosis not present

## 2020-03-12 DIAGNOSIS — R079 Chest pain, unspecified: Secondary | ICD-10-CM | POA: Diagnosis not present

## 2020-03-12 MED ORDER — MELOXICAM 7.5 MG PO TABS: 7.5000 mg | ORAL_TABLET | Freq: Every day | ORAL | 1 refills | Status: DC
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
## 2020-03-14 DIAGNOSIS — R4020 Unspecified coma: Secondary | ICD-10-CM | POA: Diagnosis not present

## 2020-03-20 ENCOUNTER — Telehealth: Payer: Self-pay

## 2020-03-20 DIAGNOSIS — Z961 Presence of intraocular lens: Secondary | ICD-10-CM | POA: Diagnosis not present

## 2020-03-20 DIAGNOSIS — H04123 Dry eye syndrome of bilateral lacrimal glands: Secondary | ICD-10-CM | POA: Diagnosis not present

## 2020-03-20 NOTE — Telephone Encounter (Signed)
Nurse Corrie Dandy( [phone] ) from [facility] called that pt PhQ9 score is 8 and I advised nurse pt see psychiatrist for depression  And also send message to dr Welton Flakes

## 2020-03-20 NOTE — Telephone Encounter (Signed)
Error

## 2020-04-01 ENCOUNTER — Other Ambulatory Visit: Payer: Self-pay | Admitting: Internal Medicine

## 2020-04-04 ENCOUNTER — Telehealth: Payer: Self-pay

## 2020-04-04 NOTE — Telephone Encounter (Signed)
Pt called that her chest pain is getting worse as per dr Welton Flakes pt advised need to go to ED

## 2020-04-09 ENCOUNTER — Telehealth: Payer: Self-pay

## 2020-04-09 ENCOUNTER — Encounter: Payer: Self-pay | Admitting: Internal Medicine

## 2020-04-09 NOTE — Telephone Encounter (Signed)
Patient called and wanted to see Dr Freda Munro for pulmonary for sarcoid but then she decided to stay with dr flemming and call us back if she decides to see [place]. Beth

## 2020-04-09 NOTE — Telephone Encounter (Signed)
Spoke with ashley from [employer] for wheelchair she said she going to check and call me back

## 2020-04-10 ENCOUNTER — Other Ambulatory Visit: Payer: Self-pay | Admitting: Internal Medicine

## 2020-04-15 ENCOUNTER — Telehealth: Payer: Self-pay

## 2020-04-15 NOTE — Telephone Encounter (Signed)
ADRIEN FROM [facility] CARE [phone] EXT [phone] ABOUT PT [employer] [employer] POWER WHEEL CHAIR THAT THEY COVERED [employer] [employer] FAX IS INFORMATION ABOUT FORM TO FILL OUT AND ALSO WHAT THEY FACE TO FACE EVALUATION AND WE MAKE APPT

## 2020-04-16 DIAGNOSIS — R569 Unspecified convulsions: Secondary | ICD-10-CM | POA: Diagnosis not present

## 2020-04-17 DIAGNOSIS — G8929 Other chronic pain: Secondary | ICD-10-CM | POA: Diagnosis not present

## 2020-04-17 DIAGNOSIS — R0789 Other chest pain: Secondary | ICD-10-CM | POA: Diagnosis not present

## 2020-04-17 DIAGNOSIS — D869 Sarcoidosis, unspecified: Secondary | ICD-10-CM | POA: Diagnosis not present

## 2020-04-22 ENCOUNTER — Other Ambulatory Visit: Payer: Self-pay | Admitting: Internal Medicine

## 2020-04-22 DIAGNOSIS — R091 Pleurisy: Secondary | ICD-10-CM

## 2020-04-23 ENCOUNTER — Telehealth: Payer: Self-pay

## 2020-04-23 NOTE — Telephone Encounter (Signed)
Confirmed and screened for 04-24-20 ov. °

## 2020-04-24 ENCOUNTER — Encounter: Payer: Self-pay | Admitting: Internal Medicine

## 2020-04-24 ENCOUNTER — Ambulatory Visit (INDEPENDENT_AMBULATORY_CARE_PROVIDER_SITE_OTHER): Payer: Medicare Other | Admitting: Internal Medicine

## 2020-04-24 ENCOUNTER — Telehealth: Payer: Self-pay

## 2020-04-24 ENCOUNTER — Other Ambulatory Visit: Payer: Self-pay

## 2020-04-24 DIAGNOSIS — Z7689 Persons encountering health services in other specified circumstances: Secondary | ICD-10-CM

## 2020-04-24 DIAGNOSIS — R531 Weakness: Secondary | ICD-10-CM

## 2020-04-24 DIAGNOSIS — G251 Drug-induced tremor: Secondary | ICD-10-CM | POA: Diagnosis not present

## 2020-04-24 DIAGNOSIS — R55 Syncope and collapse: Secondary | ICD-10-CM

## 2020-04-24 MED ORDER — PNEUMOCOCCAL 13-VAL CONJ VACC IM SUSP: 0.5000 mL | INTRAMUSCULAR | 0 refills | Status: AC

## 2020-04-24 NOTE — Progress Notes (Signed)
[facility] [address] [place], [place] [address]  Internal MEDICINE  Office Visit Note  Patient Name: Jennifer Bright  [id number]  [id number]  Date of Service: 04/24/2020  Chief Complaint  Patient presents with  . Loss of Consciousness    Wants to discuss round power wheelchair  . Diabetes  . Gastroesophageal Reflux  . Hyperlipidemia  . Depression  . Anemia  . Anxiety  . Quality Metric Gaps    PNA Vaccine    HPI Pt is here for face to face evaluation and paper work requirement for power mobility device. Pt has been using a regular wheelchair for last 8-10 years, lately her weakness, passing out spells and tremors have been worse. She did see neurology and now has EEG scheduled for possible work up for epilepsy. Pt feels very depressed due to these episodes. She feels weak and has tremors, risk of fall is present as well.  C/o chest pain, has been started on prednisone by her pulmonologist   Current Medication: Outpatient Encounter Medications as of 04/24/2020  Medication Sig  . ALPRAZolam (XANAX) 1 MG tablet Take 1 mg by mouth 2 (two) times daily as needed for anxiety or sleep.   Marland Kitchen ALPRAZOLAM XR 1 MG 24 hr tablet Take 1 mg by mouth 2 (two) times daily.  Marland Kitchen amphetamine-dextroamphetamine (ADDERALL XR) 30 MG 24 hr capsule Take 30 mg by mouth 2 (two) times daily.  . Armodafinil 250 MG tablet Take 250 mg by mouth daily.  Marland Kitchen atorvastatin (LIPITOR) 40 MG tablet Take 40 mg by mouth at bedtime.   . benztropine (COGENTIN) 1 MG tablet Take 1 mg by mouth 2 (two) times daily.   Marland Kitchen buPROPion (WELLBUTRIN XL) 300 MG 24 hr tablet Take 300 mg by mouth daily.  . Cholecalciferol (VITAMIN D3) 50 MCG (2000 UT) capsule Take 2,000 Units by mouth daily.  . Cyanocobalamin (B-12) 2500 MCG TABS Take 2,500 mcg by mouth daily.  [name] 100 MG CAPS Take by mouth.  . levothyroxine (SYNTHROID) 100 MCG tablet TAKE 1 TABLET EVERY DAY ON EMPTY STOMACHWITH A GLASS OF WATER AT LEAST  30-60 MINBEFORE BREAKFAST  . linaclotide (LINZESS) 145 MCG CAPS capsule Take 1 capsule (145 mcg total) by mouth daily before breakfast.  . Melatonin 10 MG TABS Take 10 mg by mouth at bedtime.  . meloxicam (MOBIC) 7.5 MG tablet TAKE ONE TABLET EVERY DAY FOR CHEST PAINS FOR 3 DAYS AND THEN AS NEEDED  . methylPREDNISolone (MEDROL) 4 MG tablet Take 4 mg by mouth daily.  . Multiple Vitamin (MULTIVITAMIN) capsule Take by mouth.  . Omega-3 300 MG CAPS Take 1.2 g by mouth daily.   . Ospemifene (OSPHENA) 60 MG TABS Take 1 tablet by mouth daily.  . pantoprazole (PROTONIX) 40 MG tablet TAKE ONE TABLET EVERY DAY 15-20 MINUTES BEFORE A MEAL  . topiramate (TOPAMAX) 200 MG tablet TAKE 1 TABLET BY MOUTH IN  THE MORNING AND 1 TABLET BY MOUTH IN THE EVENING  . Vitamin E Acetate 125 UNIT/ML LIQD Take 670 Units by mouth daily.  . ziprasidone (GEODON) 60 MG capsule Take 180 mg by mouth every evening. After dinner  . [DISCONTINUED] traMADol (ULTRAM) 50 MG tablet tramadol 50 mg tablet  Take 1 tablet every 6 hours by oral route as needed.   No facility-administered encounter medications on file as of 04/24/2020.    Surgical History: Past Surgical History:  Procedure Laterality Date  . ABDOMINAL HYSTERECTOMY    . APPENDECTOMY    .  BLADDER SURGERY     bladder tact  . CATARACT EXTRACTION W/PHACO Right 09/21/2018   Procedure: CATARACT EXTRACTION PHACO AND INTRAOCULAR LENS PLACEMENT (IOC);  Surgeon: Jennifer Bright;  Location: [facility] ORS;  Service: Ophthalmology;  Laterality: Right;  [place]  00:20 CDE 00.82 Fluid pack lot # [id number]  . CATARACT EXTRACTION W/PHACO Left 10/17/2018   Procedure: CATARACT EXTRACTION PHACO AND INTRAOCULAR LENS PLACEMENT (IOC)  LEFT;  Surgeon: Jennifer Bright;  [facility];  Service: Ophthalmology;  Laterality: Left;  diabetic - diet controlled  . CESAREAN SECTION    . CHOLECYSTECTOMY    . COLONOSCOPY WITH PROPOFOL N/A 04/05/2017   Procedure: COLONOSCOPY WITH  PROPOFOL;  Surgeon: Jennifer Bright;  [facility];  Service: Endoscopy;  Laterality: N/A;  . ESOPHAGOGASTRODUODENOSCOPY (EGD) WITH PROPOFOL N/A 08/28/2016   Procedure: ESOPHAGOGASTRODUODENOSCOPY (EGD) WITH PROPOFOL;  Surgeon: Jennifer DeemMartin U Skulskie, Bright;  [facility];  Service: Endoscopy;  Laterality: N/A;  . HERNIA REPAIR    . INGUINAL HERNIA REPAIR     rt and left  . lexiscan cardiolite    . TILT TABLE STUDY N/A 04/01/2012   Procedure: TILT TABLE STUDY;  Surgeon: Jennifer Bright;  [facility];  Service: Cardiovascular;  Laterality: N/A;    Medical History: Past Medical History:  Diagnosis Date  . Acid reflux   . Anemia   . Anxiety   . Bipolar disorder (HCC)   . Cataract   . Chronic kidney disease    STAGE 3  . Depression   . Diabetes mellitus    NIDD  . Essential tremor   . Family history of breast cancer    doesn't meet medicare guidelines for cancer genetic testing.   . Family history of ovarian cancer    Pt doesn't meet Medicare genetic testing guidelines  . Hyperlipidemia   . Hypothyroid   . IBS (irritable bowel syndrome)   . Migraine headache    vestibular migraine  . Obesity   . Osteopenia   . Restless leg syndrome   . Sarcoidosis   . Sleep apnea   . Syncope \    Family History: Family History  Problem Relation Age of Onset  . Colon cancer Father   . Stomach cancer Father   . Hypertension Mother   . Hyperlipidemia Mother   . Stroke Mother   . Alcohol abuse Brother   . Irritable bowel syndrome Sister   . Breast cancer Paternal Aunt   . Ovarian cancer Paternal Aunt   . Cancer Cousin   . Cancer Cousin   . Ovarian cancer Cousin   . Kidney cancer Neg Hx   . Kidney disease Neg Hx   . Prostate cancer Neg Hx     Social History   Socioeconomic History  . Marital status: Married    Spouse name: Not on file  . Number of children: Not on file  . Years of education: Not on file  . Highest education level: Not on  file  Occupational History  . Occupation: disabled  Tobacco Use  . Smoking status: Former Smoker    Types: Cigarettes    Quit date: 2018    Years since quitting: 3.5  . Smokeless tobacco: Never Used  Vaping Use  . Vaping Use: Never used  Substance and Sexual Activity  . Alcohol use: No  . Drug use: No  . Sexual activity: Yes  Other Topics Concern  . Not on file  Social  History Narrative  . Not on file   Social Determinants of Health   Financial Resource Strain: Low Risk   . Difficulty of Paying Living Expenses: Not hard at all  Food Insecurity: No Food Insecurity  . Worried About Programme researcher, broadcasting/film/video in the Last Year: Never true  . Ran Out of Food in the Last Year: Never true  Transportation Needs: No Transportation Needs  . Lack of Transportation (Medical): No  . Lack of Transportation (Non-Medical): No  Physical Activity: Inactive  . Days of Exercise per Week: 0 days  . Minutes of Exercise per Session: 0 min  Stress: Stress Concern Present  . Feeling of Stress : Very much  Social Connections: Moderately Isolated  . Frequency of Communication with Friends and Family: Never  . Frequency of Social Gatherings with Friends and Family: Never  . Attends Religious Services: More than 4 times per year  . Active Member of Clubs or Organizations: No  . Attends Banker Meetings: Never  . Marital Status: Married  Catering manager Violence: Not At Risk  . Fear of Current or Ex-Partner: No  . Emotionally Abused: No  . Physically Abused: No  . Sexually Abused: No   Review of Systems  Constitutional: Negative for chills, diaphoresis and fatigue.  HENT: Negative for ear pain, postnasal drip and sinus pressure.   Eyes: Negative for photophobia, discharge, redness, itching and visual disturbance.  Respiratory: Negative for cough, shortness of breath and wheezing.   Cardiovascular: Negative for chest pain, palpitations and leg swelling.  Gastrointestinal: Negative for  abdominal pain, constipation, diarrhea, nausea and vomiting.  Genitourinary: Negative for dysuria and flank pain.  Musculoskeletal: Positive for arthralgias, back pain, myalgias and neck pain. Negative for gait problem.  Skin: Negative for color change.  Allergic/Immunologic: Negative for environmental allergies and food allergies.  Neurological: Positive for tremors, syncope and weakness. Negative for dizziness and headaches.       Falls   Hematological: Does not bruise/bleed easily.  Psychiatric/Behavioral: Positive for agitation. Negative for behavioral problems (depression) and hallucinations. The patient is nervous/anxious and is hyperactive.     Vital Signs: BP 115/65   Pulse 85   Temp 97.8 F (36.6 C)   Resp 16   Ht 5\' 4"  (1.626 m)   Wt 171 lb (77.6 kg)   SpO2 97%   BMI 29.35 kg/m    Physical Exam Constitutional:      General: She is not in acute distress.    Appearance: She is well-developed. She is not diaphoretic.  HENT:     Head: Normocephalic and atraumatic.     Mouth/Throat:     Pharynx: No oropharyngeal exudate.  Eyes:     Pupils: Pupils are equal, round, and reactive to light.  Neck:     Thyroid: No thyromegaly.     Vascular: No JVD.     Trachea: No tracheal deviation.  Cardiovascular:     Rate and Rhythm: Normal rate and regular rhythm.     Heart sounds: Normal heart sounds. No murmur heard.  No friction rub. No gallop.   Pulmonary:     Effort: Pulmonary effort is normal. No respiratory distress.     Breath sounds: No wheezing or rales.  Chest:     Chest wall: No tenderness.  Abdominal:     General: Bowel sounds are normal.     Palpations: Abdomen is soft.  Musculoskeletal:        General: Deformity present. Normal range of motion.  Cervical back: Normal range of motion and neck supple.  Lymphadenopathy:     Cervical: No cervical adenopathy.  Skin:    General: Skin is warm and dry.  Neurological:     Mental Status: She is alert and oriented  to person, place, and time.     Cranial Nerves: No cranial nerve deficit.     Motor: Weakness present.     Coordination: Coordination abnormal.     Gait: Gait abnormal.     Deep Tendon Reflexes: Reflexes abnormal.  Psychiatric:     Comments: Pt seems to be depressed, coarse tremors are present, she is in wheel chair       Assessment/Plan: 1. Syncope and collapse - per neurology, might be epilepsy episodes, she will have EEG done  2. Episode of generalized weakness - fall precaution - For home use only DME Other see comment  3. Tremor due to multiple drugs - Pt continues to have tremors which interfere with use of regular w/c, she will get benefit from POV  4. Encounter for power mobility device assessment Face to face encounter today to discuss need for power mobility device. Patient has had numerous falls due to her chronic medical conditions. Suffers from narcolepsy which increases her fall risk. 2-3 syncopal episodes per week which she has fallen many times due to this condition. Remains depressed . Due to her carpal tunnel, tremors and chronic pain she is unable to wheel herself, must rely on her husband. - For home use only DME Other see comment General Counseling: shamera yarberry understanding of the findings of todays visit and agrees with plan of treatment. I have discussed any further diagnostic evaluation that may be needed or ordered today. We also reviewed her medications today. she has been encouraged to call the office with any questions or concerns that should arise related to todays visit.   Orders Placed This Encounter  Procedures  . For home use only DME Other see comment   Total time spent:45 Minutes Time spent includes review of chart, medications, test results, and follow up plan with the patient.   Dr Lyndon Code Internal medicine

## 2020-04-24 NOTE — Telephone Encounter (Signed)
Faxed [employer] for power mobility device with the last few pt. Notes.

## 2020-04-26 ENCOUNTER — Other Ambulatory Visit: Payer: Self-pay

## 2020-04-26 MED ORDER — PNEUMOCOCCAL 13-VAL CONJ VACC IM SUSP: 0.5000 mL | INTRAMUSCULAR | 0 refills | Status: AC

## 2020-05-07 ENCOUNTER — Other Ambulatory Visit: Payer: Self-pay

## 2020-05-07 MED ORDER — PNEUMOCOCCAL 13-VAL CONJ VACC IM SUSP: 0.5000 mL | INTRAMUSCULAR | 0 refills | Status: AC

## 2020-05-16 ENCOUNTER — Other Ambulatory Visit
Admission: RE | Admit: 2020-05-16 | Discharge: 2020-05-16 | Disposition: A | Discharge status: Home / Self Care | Payer: Medicare Other | Source: Ambulatory Visit | Attending: Specialist | Admitting: Specialist

## 2020-05-16 DIAGNOSIS — D869 Sarcoidosis, unspecified: Secondary | ICD-10-CM | POA: Diagnosis not present

## 2020-05-16 DIAGNOSIS — M791 Myalgia, unspecified site: Secondary | ICD-10-CM | POA: Diagnosis not present

## 2020-05-16 DIAGNOSIS — R0789 Other chest pain: Secondary | ICD-10-CM | POA: Diagnosis not present

## 2020-05-16 DIAGNOSIS — M898X9 Other specified disorders of bone, unspecified site: Secondary | ICD-10-CM | POA: Diagnosis not present

## 2020-05-16 LAB — FIBRIN DERIVATIVES D-DIMER (ARMC ONLY): Fibrin derivatives D-dimer (ARMC): 213.34 ng/mL (FEU) (ref 0.00–499.00)

## 2020-05-21 ENCOUNTER — Ambulatory Visit (INDEPENDENT_AMBULATORY_CARE_PROVIDER_SITE_OTHER): Payer: Medicare Other | Admitting: Obstetrics & Gynecology

## 2020-05-21 ENCOUNTER — Encounter: Payer: Self-pay | Admitting: Obstetrics & Gynecology

## 2020-05-21 ENCOUNTER — Other Ambulatory Visit: Payer: Self-pay

## 2020-05-21 ENCOUNTER — Other Ambulatory Visit (HOSPITAL_COMMUNITY)
Admission: RE | Admit: 2020-05-21 | Discharge: 2020-05-21 | Disposition: A | Discharge status: Home / Self Care | Payer: Medicare Other | Source: Ambulatory Visit | Attending: Obstetrics & Gynecology | Admitting: Obstetrics & Gynecology

## 2020-05-21 VITALS — BP 100/70 | Ht 64.0 in | Wt 166.0 lb

## 2020-05-21 DIAGNOSIS — N898 Other specified noninflammatory disorders of vagina: Secondary | ICD-10-CM | POA: Diagnosis not present

## 2020-05-21 DIAGNOSIS — N3942 Incontinence without sensory awareness: Secondary | ICD-10-CM | POA: Diagnosis not present

## 2020-05-21 NOTE — Progress Notes (Signed)
Gynecology Evaluation   Chief Complaint: Leakage of urine  History of Present Illness:   Patient is a 54 y.o. [id number], presents today for a problem visit.  The patient is menopausal. She complains of urinary symptoms of urinary incontinence. Patient reports her symptoms began several years ago and its severity is described as worsening and more severe in disruption of lifestyle.  Her symptoms include foul smelling urine and incontinence.  She is having leakage of urine with stressful activities, such as coughing, sneezing, laughter, or exercise.  She is not having leakage of urine based on symptoms of urgency or bladder spasm/discomfort.  In fact, she leaks several times a day without warning or sign, and has to wear pads or diapers most of the time.  The odor is embarrassing and causes social isolation.   She is not having nocturia, with enuresis at night but not awakened by spasm or warning.  She is using pads or diapers daily for control of symptoms. She is not having dysuria.  She has not had a history of UTI.  She drinks about 2 caffeinated drinks per day.  She has had prior procedures for incontinence - sling many years ago.  PMHx: She  has a past medical history of Acid reflux, Anemia, Anxiety, Bipolar disorder (HCC), Cataract, Chronic kidney disease, Depression, Diabetes mellitus, Essential tremor, Family history of breast cancer, Family history of ovarian cancer, Hyperlipidemia, Hypothyroid, IBS (irritable bowel syndrome), Migraine headache, Obesity, Osteopenia, Restless leg syndrome, Sarcoidosis, Sleep apnea, and Syncope (\). Also,  has a past surgical history that includes Appendectomy; Cholecystectomy; Abdominal hysterectomy; Inguinal hernia repair; lexiscan cardiolite; tilt table study (N/A, 04/01/2012); Cesarean section; Hernia repair; Bladder surgery; Esophagogastroduodenoscopy (egd) with propofol (N/A, 08/28/2016); Colonoscopy with propofol (N/A, 04/05/2017); Cataract extraction w/PHACO  (Right, 09/21/2018); and Cataract extraction w/PHACO (Left, 10/17/2018)., family history includes Alcohol abuse in her brother; Breast cancer in her paternal aunt; Cancer in her cousin and cousin; Colon cancer in her father; Hyperlipidemia in her mother; Hypertension in her mother; Irritable bowel syndrome in her sister; Ovarian cancer in her cousin and paternal aunt; Stomach cancer in her father; Stroke in her mother.,  reports that she quit smoking about 3 years ago. Her smoking use included cigarettes. She has never used smokeless tobacco. She reports that she does not drink alcohol and does not use drugs.  She has a current medication list which includes the following prescription(s): alprazolam, alprazolam xr, amphetamine-dextroamphetamine, armodafinil, atorvastatin, benztropine, bupropion, vitamin d3, b-12, dss, levothyroxine, linaclotide, melatonin, meloxicam, methylprednisolone, multivitamin, omega-3, osphena, pantoprazole, topiramate, vitamin e acetate, and ziprasidone. Also, is allergic to cleocin [clindamycin hcl].  Review of Systems  Constitutional: Positive for malaise/fatigue. Negative for chills and fever.  HENT: Negative for congestion, sinus pain and sore throat.   Eyes: Negative for blurred vision and pain.  Respiratory: Negative for cough and wheezing.   Cardiovascular: Positive for chest pain. Negative for leg swelling.  Gastrointestinal: Negative for abdominal pain, constipation, diarrhea, heartburn, nausea and vomiting.  Genitourinary: Positive for frequency. Negative for dysuria, hematuria and urgency.  Musculoskeletal: Negative for back pain, joint pain, myalgias and neck pain.  Skin: Negative for itching and rash.  Neurological: Positive for dizziness, weakness and headaches. Negative for tremors.  Endo/Heme/Allergies: Bruises/bleeds easily.  Psychiatric/Behavioral: Positive for depression. The patient is nervous/anxious. The patient does not have insomnia.      Objective: BP 100/70   Ht 5\' 4"  (1.626 m)   Wt 166 lb (75.3 kg)   BMI 28.49 kg/m  Physical  Exam Constitutional:      General: She is not in acute distress.    Appearance: She is well-developed.  Genitourinary:     Pelvic exam was performed with patient supine.     Vagina normal.     No vaginal erythema or bleeding.     Genitourinary Comments: Cuff intact/ no lesions Mild atrophy, min cystocele, no mesh erosion Absent uterus and cervix  HENT:     Head: Normocephalic and atraumatic.     Nose: Nose normal.  Abdominal:     General: There is no distension.     Palpations: Abdomen is soft.     Tenderness: There is no abdominal tenderness.  Musculoskeletal:        General: Normal range of motion.  Neurological:     Mental Status: She is alert and oriented to person, place, and time.     Cranial Nerves: No cranial nerve deficit.  Skin:    General: Skin is warm and dry.  Psychiatric:        Attention and Perception: Attention normal.        Mood and Affect: Mood normal.        Speech: Speech normal.        Behavior: Behavior normal.        Cognition and Memory: Cognition normal.        Judgment: Judgment normal.     Female chaperone present for pelvic portion of the physical exam  Assessment: 54 y.o. [id number] for urologic evaluation of incontinence 1. Vaginal odor - Cervicovaginal ancillary only  2. Urinary incontinence without sensory awareness Mixed picture but more sensory deprivation than hyperactivity May be a component of her chronic kidney disease - Ambulatory referral to Urogynecology - Options for therapy discussed once etiology dtermined  A total of 20 minutes were spent face-to-face with the patient as well as preparation, review, communication, and documentation during this encounter.     Annamarie Major, MD, [facility], [facility] 05/21/2020  5:00 PM

## 2020-05-23 LAB — CERVICOVAGINAL ANCILLARY ONLY
Bacterial Vaginitis (gardnerella): NEGATIVE
Candida Glabrata: NEGATIVE
Candida Vaginitis: NEGATIVE
Comment: NEGATIVE
Comment: NEGATIVE
Comment: NEGATIVE

## 2020-05-29 DIAGNOSIS — R350 Frequency of micturition: Secondary | ICD-10-CM | POA: Diagnosis not present

## 2020-05-29 DIAGNOSIS — M6289 Other specified disorders of muscle: Secondary | ICD-10-CM | POA: Diagnosis not present

## 2020-05-30 DIAGNOSIS — R21 Rash and other nonspecific skin eruption: Secondary | ICD-10-CM | POA: Diagnosis not present

## 2020-05-30 DIAGNOSIS — B353 Tinea pedis: Secondary | ICD-10-CM | POA: Diagnosis not present

## 2020-06-01 DIAGNOSIS — S2020XA Contusion of thorax, unspecified, initial encounter: Secondary | ICD-10-CM | POA: Diagnosis not present

## 2020-06-03 DIAGNOSIS — N1831 Chronic kidney disease, stage 3a: Secondary | ICD-10-CM | POA: Diagnosis not present

## 2020-06-03 DIAGNOSIS — I129 Hypertensive chronic kidney disease with stage 1 through stage 4 chronic kidney disease, or unspecified chronic kidney disease: Secondary | ICD-10-CM | POA: Diagnosis not present

## 2020-06-03 DIAGNOSIS — E1165 Type 2 diabetes mellitus with hyperglycemia: Secondary | ICD-10-CM | POA: Diagnosis not present

## 2020-06-03 DIAGNOSIS — E1121 Type 2 diabetes mellitus with diabetic nephropathy: Secondary | ICD-10-CM | POA: Diagnosis not present

## 2020-06-03 DIAGNOSIS — R809 Proteinuria, unspecified: Secondary | ICD-10-CM | POA: Diagnosis not present

## 2020-06-04 DIAGNOSIS — R569 Unspecified convulsions: Secondary | ICD-10-CM | POA: Insufficient documentation

## 2020-06-11 ENCOUNTER — Other Ambulatory Visit: Payer: Self-pay

## 2020-06-11 MED ORDER — ACCU-CHEK GUIDE VI STRP: 1.0000 | ORAL_STRIP | Freq: Two times a day (BID) | 3 refills | Status: DC

## 2020-06-11 MED ORDER — ACCU-CHEK SOFTCLIX LANCETS MISC: 1 refills | Status: DC

## 2020-06-11 NOTE — Telephone Encounter (Signed)
Pt called need diabetic supply and also gave her accu check meter

## 2020-06-19 ENCOUNTER — Other Ambulatory Visit: Payer: Self-pay | Admitting: Critical Care Medicine

## 2020-06-19 ENCOUNTER — Other Ambulatory Visit: Payer: Medicare Other

## 2020-06-19 DIAGNOSIS — Z20822 Contact with and (suspected) exposure to covid-19: Secondary | ICD-10-CM | POA: Diagnosis not present

## 2020-06-20 LAB — NOVEL CORONAVIRUS, NAA: SARS-CoV-2, NAA: NOT DETECTED

## 2020-06-25 ENCOUNTER — Ambulatory Visit: Payer: Medicare Other | Admitting: Internal Medicine

## 2020-06-25 ENCOUNTER — Emergency Department
Admission: EM | Admit: 2020-06-25 | Discharge: 2020-06-26 | Disposition: A | Discharge status: Other Acute Inpatient Hospital | Payer: Medicare Other | Source: Home / Self Care | Attending: Emergency Medicine | Admitting: Emergency Medicine

## 2020-06-25 ENCOUNTER — Other Ambulatory Visit: Payer: Self-pay

## 2020-06-25 ENCOUNTER — Encounter: Payer: Self-pay | Admitting: Emergency Medicine

## 2020-06-25 DIAGNOSIS — Z20822 Contact with and (suspected) exposure to covid-19: Secondary | ICD-10-CM | POA: Insufficient documentation

## 2020-06-25 DIAGNOSIS — F419 Anxiety disorder, unspecified: Secondary | ICD-10-CM | POA: Diagnosis present

## 2020-06-25 DIAGNOSIS — E119 Type 2 diabetes mellitus without complications: Secondary | ICD-10-CM | POA: Insufficient documentation

## 2020-06-25 DIAGNOSIS — S61512A Laceration without foreign body of left wrist, initial encounter: Secondary | ICD-10-CM | POA: Diagnosis present

## 2020-06-25 DIAGNOSIS — N183 Chronic kidney disease, stage 3 unspecified: Secondary | ICD-10-CM | POA: Insufficient documentation

## 2020-06-25 DIAGNOSIS — F333 Major depressive disorder, recurrent, severe with psychotic symptoms: Secondary | ICD-10-CM | POA: Diagnosis not present

## 2020-06-25 DIAGNOSIS — X789XXA Intentional self-harm by unspecified sharp object, initial encounter: Secondary | ICD-10-CM | POA: Diagnosis present

## 2020-06-25 DIAGNOSIS — F319 Bipolar disorder, unspecified: Secondary | ICD-10-CM | POA: Diagnosis present

## 2020-06-25 DIAGNOSIS — T5492XA Toxic effect of unspecified corrosive substance, intentional self-harm, initial encounter: Secondary | ICD-10-CM

## 2020-06-25 DIAGNOSIS — T50901A Poisoning by unspecified drugs, medicaments and biological substances, accidental (unintentional), initial encounter: Secondary | ICD-10-CM | POA: Diagnosis not present

## 2020-06-25 DIAGNOSIS — F315 Bipolar disorder, current episode depressed, severe, with psychotic features: Secondary | ICD-10-CM | POA: Diagnosis not present

## 2020-06-25 DIAGNOSIS — F3130 Bipolar disorder, current episode depressed, mild or moderate severity, unspecified: Secondary | ICD-10-CM | POA: Insufficient documentation

## 2020-06-25 DIAGNOSIS — F3181 Bipolar II disorder: Secondary | ICD-10-CM | POA: Diagnosis present

## 2020-06-25 DIAGNOSIS — F3112 Bipolar disorder, current episode manic without psychotic features, moderate: Secondary | ICD-10-CM | POA: Diagnosis present

## 2020-06-25 DIAGNOSIS — E039 Hypothyroidism, unspecified: Secondary | ICD-10-CM | POA: Insufficient documentation

## 2020-06-25 LAB — COMPREHENSIVE METABOLIC PANEL
ALT: 30 U/L (ref 0–44)
AST: 21 U/L (ref 15–41)
Albumin: 4.6 g/dL (ref 3.5–5.0)
Alkaline Phosphatase: 61 U/L (ref 38–126)
Anion gap: 10 (ref 5–15)
BUN: 26 mg/dL — ABNORMAL HIGH (ref 6–20)
CO2: 24 mmol/L (ref 22–32)
Calcium: 10.3 mg/dL (ref 8.9–10.3)
Chloride: 111 mmol/L (ref 98–111)
Creatinine, Ser: 1.12 mg/dL — ABNORMAL HIGH (ref 0.44–1.00)
GFR calc Af Amer: >60 mL/min (ref >60)
GFR calc non Af Amer: 56 mL/min — ABNORMAL LOW (ref >60)
Glucose, Bld: 119 mg/dL — ABNORMAL HIGH (ref 70–99)
Potassium: 3.5 mmol/L (ref 3.5–5.1)
Sodium: 145 mmol/L (ref 135–145)
Total Bilirubin: 0.7 mg/dL (ref 0.3–1.2)
Total Protein: 7.6 g/dL (ref 6.5–8.1)

## 2020-06-25 LAB — SALICYLATE LEVEL: Salicylate Lvl: <7 mg/dL — ABNORMAL LOW (ref 7.0–30.0)

## 2020-06-25 LAB — CBC
HCT: 41.9 % (ref 36.0–46.0)
Hemoglobin: 14.4 g/dL (ref 12.0–15.0)
MCH: 30.3 pg (ref 26.0–34.0)
MCHC: 34.4 g/dL (ref 30.0–36.0)
MCV: 88 fL (ref 80.0–100.0)
Platelets: 213 10*3/uL (ref 150–400)
RBC: 4.76 MIL/uL (ref 3.87–5.11)
RDW: 12.7 % (ref 11.5–15.5)
WBC: 6.4 10*3/uL (ref 4.0–10.5)
nRBC: 0 % (ref 0.0–0.2)

## 2020-06-25 LAB — TSH: TSH: 1.737 u[IU]/mL (ref 0.350–4.500)

## 2020-06-25 LAB — ACETAMINOPHEN LEVEL
Acetaminophen (Tylenol), Serum: <10 ug/mL — ABNORMAL LOW (ref 10–30)
Acetaminophen (Tylenol), Serum: <10 ug/mL — ABNORMAL LOW (ref 10–30)

## 2020-06-25 LAB — ETHANOL: Alcohol, Ethyl (B): <10 mg/dL (ref <10)

## 2020-06-25 NOTE — ED Triage Notes (Signed)
Pt via pov from home in wheelchair after ingesting "more than a half a glass" of bleach. Pt denies si; states that she "just wanted to sleep." She called her husband, who brought her to ED immediately. Pt has suffered from depression for years and has tried to end her life before by taking pills. Pt also tried to cut her wrist once. Pt alert; states she feels burning in her stomach and throat.

## 2020-06-25 NOTE — ED Provider Notes (Signed)
Dragon is down  Cc: drank bleach  hpi pt drank more than 1/2 glass of bleach "to try to get some sleep". She says she saw it on TV. Pt says she can't rest. Her psychiatrist put her on adderal last week but it's not working. Pt reports some burling in her lower abd but not with urination. It is not tender to palpation.   PMH Kidney dis Diabetes,  sarcoid htn Seizure like activity  Allergies clindamycin MEDS: . ALPRAZolam (XANAX) 1 MG tablet, Take 1 tablet by mouth daily, Disp: , Rfl:  . ALPRAZolam (XANAX) 2 MG tablet, Take 2 mg by mouth 3 (three) times a day if needed for anxiety, Disp: , Rfl:  . amphetamine-dextroamphetamine XR (ADDERALL XR) 30 MG 24 hr capsule, Take 1 capsule by mouth twice a day, Disp: , Rfl:  . atorvastatin (LIPITOR) 40 MG tablet, TAKE 1 TABLET BY MOUTH ONCE DAILY, Disp: , Rfl:  . benztropine (COGENTIN) 1 MG tablet, Take 1 tablet by mouth 2 (two) times a day, Disp: , Rfl:  . buPROPion XL (WELLBUTRIN XL) 300 MG 24 hr tablet, Take 300 mg by mouth daily, Disp: , Rfl:  . cyanocobalamin (VITAMIN B-12) 2500 MCG tablet, Take 2,500 mcg by mouth, Disp: , Rfl:  . docusate sodium (COLACE) 100 MG capsule, Take 100 mg by mouth 2 (two) times a day , Disp: , Rfl:  . escitalopram (LEXAPRO) 20 MG tablet, Take 20 mg by mouth 1 (one) time each day, Disp: , Rfl:  . furosemide (LASIX) 20 MG tablet, if needed , Disp: , Rfl:  . levothyroxine (SYNTHROID, LEVOTHROID) 100 MCG tablet, TAKE 1 TABLET EVERY DAY ON EMPTY STOMACHWITH A GLASS OF WATER AT LEAST 30-60 MINBEFORE BREAKFAST, Disp: , Rfl:  . linaCLOtide (Linzess) 145 MCG capsule, Take 145 mcg by mouth 1 (one) time each day if needed, Disp: , Rfl:  . Melatonin 10 MG capsule, Take 1 capsule by mouth at bed time, Disp: , Rfl:  . meloxicam (MOBIC) 7.5 MG tablet, TAKE ONE TABLET EVERY DAY FOR CHEST PAINS FOR 3 DAYS AND THEN AS NEEDED, Disp: , Rfl:  . Multiple Vitamin (multivitamin) capsule, Take 1 capsule by mouth 1 (one) time each day, Disp: ,  Rfl:  . omega-3 (FISH OIL) 1000 MG capsule, Take 1,000 mg by mouth 1 (one) time each day, Disp: , Rfl:  . Ospemifene 60 MG tablet, Take 60 mg by mouth daily, Disp: , Rfl:  . pantoprazole (PROTONIX) 40 MG EC tablet, TAKE 1 TABLET BY MOUTH DAILY TAKE 15-20 MINUTES BEFORE A MEAL, Disp: , Rfl:  . primidone (MYSOLINE) 50 MG tablet, TAKE 1 TABLET BY MOUTH 2 TIMES DAILY AND 2 TABLETS AT BEDTIME, Disp: , Rfl:  . topiramate (TOPAMAX) 200 MG tablet, Take 2 tablets in the morning and 1.5 tablets at bedtime, Disp: , Rfl:  . Vitamin E Acetate 125 UNIT/ML liquid, Take 670 Units by mouth, Disp: , Rfl:  . ziprasidone (GEODON) 60 MG capsule, Take 3 capsules by mouth at bed time, Disp: , Rfl:  . liothyronine (CYTOMEL) 5 MCG tablet, TAKE 1 TABLET BY MOUTH TWICE A DAY (Patient not taking: Reported on 06/03/2020), Disp: , Rfl:   meds copied from old record.   SH  No drug abuse  ROS:  Consitiutional Not resting or sleeping well  head no complaints Eyes: no complaints Ears: no complaints Ht no complaint  lungs no complaints  abd as noted in hpi lower abd burning  back no complaints  neuro no complaints  today except not resting  psych as in hpi  PE  General wdwn Female in no acute distress  Mouth : no erythemna or exudate  neck no stridor  ht rrr no mur  lungs clr no retractions  abd soft bs+ not tender to palp  ext no edema or tenderness Psych: not agitated  dinies SI says drank the bleach "to sleep"  AP  Patient sees multiple providers who are well involved in her care. SHe either has lack of insight or is not accuratew in her reason for drinking bleach.    Arnaldo Natal, MD 06/25/20 [phone]

## 2020-06-25 NOTE — ED Notes (Signed)
ENVIRONMENTAL ASSESSMENT Potentially harmful objects out of patient reach: Yes.   Personal belongings secured: Yes.   Patient dressed in hospital provided attire only: Yes.   Plastic bags out of patient reach: Yes.   Patient care equipment (cords, cables, call bells, lines, and drains) shortened, removed, or accounted for: Yes.   Equipment and supplies removed from bottom of stretcher: Yes.   Potentially toxic materials out of patient reach: Yes.   Sharps container removed or out of patient reach: Yes.      Pt. Alert and oriented, warm and dry, in no distress. Pt. Denies SI, HI, and AVH. Pt. Encouraged to let nursing staff know of any concerns or needs.   

## 2020-06-25 NOTE — ED Notes (Signed)
Pt denies any SI intention to hurt herself. States that she did it to sleep. Pt not dressed out in triage. In visible line of sight with husband at bedside.

## 2020-06-25 NOTE — ED Notes (Signed)
Husband  Tajanay Hurley [phone]

## 2020-06-25 NOTE — ED Notes (Signed)
Per denise at poison control:  NPO x 4 hours After 4 hours acetaminophen level and fluid challenge Supportive care  If any vomiting or difficulty swallowing, GI consult

## 2020-06-25 NOTE — Consult Note (Signed)
[facility] Face-to-Face Psychiatry Consult   Reason for Consult:Drank bleach Referring Physician: Dr. Darnelle Bright Patient Identification: Jennifer Bright MRN:  [id number] Principal Diagnosis: <principal problem not specified> Diagnosis:  Active Problems:   Depression   Anxiety   Bipolar depression (HCC)   Anxiety disorder   Bipolar disorder (HCC)   Bipolar 1 disorder, manic, moderate (HCC)   Bipolar 2 disorder, major depressive episode (HCC)   Self-inflicted laceration of left wrist (HCC)   Total Time spent with patient: 1 hour  Subjective: " I have not been sleeping. I saw on TV where these guys drank bleach and felt down.  I assumed they went to sleep." Jennifer Bright is a 54 y.o. female patient presented to [facility] ED via POV from home voluntarily with her husband, Jennifer Bright [phone],  her caregiver.  The patient uses a wheelchair. She was brought in after ingesting "more than a half a glass" of bleach.    Per the ED triage nurse note, the patient denies suicidal ideation. The patient states that she "just wanted to sleep." She called her husband, who brought her to ED immediately. The patient has suffered from depression for years and has tried to end her life by taking pills. The patient also tried to cut her wrist once. The patient is alert; states she feels burning in her stomach and throat.  The patient states, "I have not been sleeping.  I sleep, but I do not.  I can hear when my husband comes into the room."  The patient voiced, "I watched a TV show, and it shows these two men drinking Clorox and fallen.  I assumed they when to sleep."  The patient voiced taken trazodone, but it makes her loopy all day, and she sleeps well into the day.  She states, "I do not like how it makes me feel."  This patient sees many providers, and she is on lots of medications. The patient was seen face-to-face by this provider; the chart was reviewed and consulted with Jennifer Bright on 06/25/2020  due to the patient's care. It was discussed with the EDP that the patient remained under observation overnight and will be reassessed in the a.m. to determine if she meets the criteria for psychiatric inpatient admission; she could be discharged back home.  On evaluation, the patient is alert and oriented x 4, calm, stutters, anxious but cooperative, and mood-congruent with affect.  The patient does not appear to be responding to internal or external stimuli. Neither is the patient presenting with any delusional thinking. The patient admits to auditory hallucinations, but she discussed it as more a whisper than a loud voice.  She denies visual hallucinations. The patient adamantly about attempting suicide.  The patient denies homicidal or self-harm ideations. The patient is not presenting with any psychotic or paranoid behaviors. During an encounter with the patient, she was able to answer questions appropriately. Collateral was obtained from her husband, Jennifer Bright, who assist in caring for the patient.  He concurs with the patient stating, "I truly do not think she was trying to end her life.  He voice, "she had been struggling with insomnia, and she wanted to sleep."  He states he has all of her medication locked up, and now she does not have access to them.  He states that if he believed that she was genuinely not trying to hurt herself.  The patient is in agreement remaining under observation overnight and reassessing in a.m. by Jennifer Bright.  HPI: Per Jennifer Bright: pt drank more than 1/2 glass of bleach "to try to get some sleep". She says she saw it on TV. Pt says she can't rest. Her psychiatrist put her on adderal last week but it's not working. Pt reports some burling in her lower abd but not with urination. It is not tender to palpation.   Past Psychiatric History: No pertinent past psychiatric history  Risk to Self:  No Risk to Others:  No Prior Inpatient Therapy:  Yes Prior Outpatient Therapy:   Yes  Past Medical History:  Past Medical History:  Diagnosis Date  . Acid reflux   . Anemia   . Anxiety   . Bipolar disorder (HCC)   . Cataract   . Chronic kidney disease    STAGE 3  . Depression   . Diabetes mellitus    NIDD  . Essential tremor   . Family history of breast cancer    doesn't meet medicare guidelines for cancer genetic testing.   . Family history of ovarian cancer    Pt doesn't meet Medicare genetic testing guidelines  . Hyperlipidemia   . Hypothyroid   . IBS (irritable bowel syndrome)   . Migraine headache    vestibular migraine  . Obesity   . Osteopenia   . Restless leg syndrome   . Sarcoidosis   . Sleep apnea   . Syncope \    Past Surgical History:  Procedure Laterality Date  . ABDOMINAL HYSTERECTOMY    . APPENDECTOMY    . BLADDER SURGERY     bladder tact  . CATARACT EXTRACTION W/PHACO Right 09/21/2018   Procedure: CATARACT EXTRACTION PHACO AND INTRAOCULAR LENS PLACEMENT (IOC);  Surgeon: Nevada Crane, MD;  Location: [facility] ORS;  Service: Ophthalmology;  Laterality: Right;  [place]  00:20 CDE 00.82 Fluid pack lot # [id number]  . CATARACT EXTRACTION W/PHACO Left 10/17/2018   Procedure: CATARACT EXTRACTION PHACO AND INTRAOCULAR LENS PLACEMENT (IOC)  LEFT;  Surgeon: Nevada Crane, MD;  Location: [facility];  Service: Ophthalmology;  Laterality: Left;  diabetic - diet controlled  . CESAREAN SECTION    . CHOLECYSTECTOMY    . COLONOSCOPY WITH PROPOFOL N/A 04/05/2017   Procedure: COLONOSCOPY WITH PROPOFOL;  Surgeon: Christena Deem, MD;  Location: [facility] ENDOSCOPY;  Service: Endoscopy;  Laterality: N/A;  . ESOPHAGOGASTRODUODENOSCOPY (EGD) WITH PROPOFOL N/A 08/28/2016   Procedure: ESOPHAGOGASTRODUODENOSCOPY (EGD) WITH PROPOFOL;  Surgeon: Christena Deem, MD;  Location: [facility] ENDOSCOPY;  Service: Endoscopy;  Laterality: N/A;  . HERNIA REPAIR    . INGUINAL HERNIA REPAIR     rt and left  . lexiscan cardiolite    . TILT TABLE STUDY N/A 04/01/2012    Procedure: TILT TABLE STUDY;  Surgeon: Duke Salvia, MD;  Location: [facility] CATH LAB;  Service: Cardiovascular;  Laterality: N/A;   Family History:  Family History  Problem Relation Age of Onset  . Colon cancer Father   . Stomach cancer Father   . Hypertension Mother   . Hyperlipidemia Mother   . Stroke Mother   . Alcohol abuse Brother   . Irritable bowel syndrome Sister   . Breast cancer Paternal Aunt   . Ovarian cancer Paternal Aunt   . Cancer Cousin   . Cancer Cousin   . Ovarian cancer Cousin   . Kidney cancer Neg Hx   . Kidney disease Neg Hx   . Prostate cancer Neg Hx    Family Psychiatric  History: Bipolar-paternal grandmother and aunt Social History:  Social History   Substance and Sexual Activity  Alcohol Use No     Social History   Substance and Sexual Activity  Drug Use No    Social History   Socioeconomic History  . Marital status: Married    Spouse name: Not on file  . Number of children: Not on file  . Years of education: Not on file  . Highest education level: Not on file  Occupational History  . Occupation: disabled  Tobacco Use  . Smoking status: Former Smoker    Types: Cigarettes    Quit date: 2018    Years since quitting: 3.6  . Smokeless tobacco: Never Used  Vaping Use  . Vaping Use: Never used  Substance and Sexual Activity  . Alcohol use: No  . Drug use: No  . Sexual activity: Yes  Other Topics Concern  . Not on file  Social History Narrative  . Not on file   Social Determinants of Health   Financial Resource Strain:   . Difficulty of Paying Living Expenses: Not on file  Food Insecurity:   . Worried About Programme researcher, broadcasting/film/video in the Last Year: Not on file  . Ran Out of Food in the Last Year: Not on file  Transportation Needs:   . Lack of Transportation (Medical): Not on file  . Lack of Transportation (Non-Medical): Not on file  Physical Activity:   . Days of Exercise per Week: Not on file  . Minutes of Exercise per Session:  Not on file  Stress:   . Feeling of Stress : Not on file  Social Connections:   . Frequency of Communication with Friends and Family: Not on file  . Frequency of Social Gatherings with Friends and Family: Not on file  . Attends Religious Services: Not on file  . Active Member of Clubs or Organizations: Not on file  . Attends Banker Meetings: Not on file  . Marital Status: Not on file   Additional Social History:    Allergies:   Allergies  Allergen Reactions  . Cleocin [Clindamycin Hcl] Other (See Comments)    GI distress    Labs:  Results for orders placed or performed during the hospital encounter of 06/25/20 (from the past 48 hour(s))  Comprehensive metabolic panel     Status: Abnormal   Collection Time: 06/25/20  5:16 PM  Result Value Ref Range   Sodium 145 135 - 145 mmol/L   Potassium 3.5 3.5 - 5.1 mmol/L   Chloride 111 98 - 111 mmol/L   CO2 24 22 - 32 mmol/L   Glucose, Bld 119 (H) 70 - 99 mg/dL    Comment: Glucose reference range applies only to samples taken after fasting for at least 8 hours.   BUN 26 (H) 6 - 20 mg/dL   Creatinine, Ser 1.61 (H) 0.44 - 1.00 mg/dL   Calcium 09.6 8.9 - 04.5 mg/dL   Total Protein 7.6 6.5 - 8.1 g/dL   Albumin 4.6 3.5 - 5.0 g/dL   AST 21 15 - 41 U/L   ALT 30 0 - 44 U/L   Alkaline Phosphatase 61 38 - 126 U/L   Total Bilirubin 0.7 0.3 - 1.2 mg/dL   GFR calc non Af Amer 56 (L) >60 mL/min   GFR calc Af Amer >60 >60 mL/min   Anion gap 10 5 - 15    Comment: Performed at [facility], [address], [place], [place] [address]  Ethanol  Status: None   Collection Time: 06/25/20  5:16 PM  Result Value Ref Range   Alcohol, Ethyl (B) <10 <10 mg/dL    Comment: (NOTE) Lowest detectable limit for serum alcohol is 10 mg/dL.  For medical purposes only. Performed at [facility], [address] [place], [place] [address]   Salicylate level     Status: Abnormal   Collection Time: 06/25/20  5:16 PM  Result  Value Ref Range   Salicylate Lvl <7.0 (L) 7.0 - 30.0 mg/dL    Comment: Performed at [facility], [address] [place], [place] [address]  Acetaminophen level     Status: Abnormal   Collection Time: 06/25/20  5:16 PM  Result Value Ref Range   Acetaminophen (Tylenol), Serum <10 (L) 10 - 30 ug/mL    Comment: (NOTE) Therapeutic concentrations vary significantly. A range of 10-30 ug/mL  may be an effective concentration for many patients. However, some  are best treated at concentrations outside of this range. Acetaminophen concentrations >150 ug/mL at 4 hours after ingestion  and >50 ug/mL at 12 hours after ingestion are often associated with  toxic reactions.  Performed at [facility], [address] [place], [place] [address]   cbc     Status: None   Collection Time: 06/25/20  5:16 PM  Result Value Ref Range   WBC 6.4 4.0 - 10.5 K/uL   RBC 4.76 3.87 - 5.11 MIL/uL   Hemoglobin 14.4 12.0 - 15.0 g/dL   HCT 88.5 36 - 46 %   MCV 88.0 80.0 - 100.0 fL   MCH 30.3 26.0 - 34.0 pg   MCHC 34.4 30.0 - 36.0 g/dL   RDW 02.7 74.1 - 28.7 %   Platelets 213 150 - 400 K/uL   nRBC 0.0 0.0 - 0.2 %    Comment: Performed at [facility], [address] [place], [place] [address]  TSH     Status: None   Collection Time: 06/25/20  5:16 PM  Result Value Ref Range   TSH 1.737 0.350 - 4.500 uIU/mL    Comment: Performed by a 3rd Generation assay with a functional sensitivity of <=0.01 uIU/mL. Performed at [facility], [address] [place], [place] [address]     No current facility-administered medications for this encounter.   Current Outpatient Medications  Medication Sig Dispense Refill  . Accu-Chek Softclix Lancets lancets Use as instructed bid DX impaired fasting glucose 100 each 1  . ALPRAZolam (XANAX) 1 MG tablet Take 1 mg by mouth 2 (two) times daily as needed for anxiety or sleep.     Marland Kitchen ALPRAZOLAM XR 1 MG 24 hr tablet Take 1 mg by mouth 2  (two) times daily.    Marland Kitchen amphetamine-dextroamphetamine (ADDERALL XR) 30 MG 24 hr capsule Take 30 mg by mouth 2 (two) times daily.    . Armodafinil 250 MG tablet Take 250 mg by mouth daily.    Marland Kitchen atorvastatin (LIPITOR) 40 MG tablet Take 40 mg by mouth at bedtime.     . benztropine (COGENTIN) 1 MG tablet Take 1 mg by mouth 2 (two) times daily.     Marland Kitchen buPROPion (WELLBUTRIN XL) 300 MG 24 hr tablet Take 300 mg by mouth daily.    . Cholecalciferol (VITAMIN D3) 50 MCG (2000 UT) capsule Take 2,000 Units by mouth daily.    . Cyanocobalamin (B-12) 2500 MCG TABS Take 2,500 mcg by mouth daily.    [name] 100 MG CAPS  Take by mouth.    Marland Kitchen glucose blood (ACCU-CHEK GUIDE) test strip 1 each by Other route in the morning and at bedtime. DX impaired fasting glucose 100 each 3  . levothyroxine (SYNTHROID) 100 MCG tablet TAKE 1 TABLET EVERY DAY ON EMPTY STOMACHWITH A GLASS OF WATER AT LEAST 30-60 MINBEFORE BREAKFAST 30 tablet 3  . linaclotide (LINZESS) 145 MCG CAPS capsule Take 1 capsule (145 mcg total) by mouth daily before breakfast. 30 capsule 3  . Melatonin 10 MG TABS Take 10 mg by mouth at bedtime.    . meloxicam (MOBIC) 7.5 MG tablet TAKE ONE TABLET EVERY DAY FOR CHEST PAINS FOR 3 DAYS AND THEN AS NEEDED 30 tablet 1  . methylPREDNISolone (MEDROL) 4 MG tablet Take 4 mg by mouth daily.    . Multiple Vitamin (MULTIVITAMIN) capsule Take by mouth.    . Omega-3 300 MG CAPS Take 1.2 g by mouth daily.     . Ospemifene (OSPHENA) 60 MG TABS Take 1 tablet by mouth daily. 90 tablet 3  . pantoprazole (PROTONIX) 40 MG tablet TAKE ONE TABLET EVERY DAY 15-20 MINUTES BEFORE A MEAL 30 tablet 3  . topiramate (TOPAMAX) 200 MG tablet TAKE 1 TABLET BY MOUTH IN  THE MORNING AND 1 TABLET BY MOUTH IN THE EVENING 180 tablet 3  . Vitamin E Acetate 125 UNIT/ML LIQD Take 670 Units by mouth daily.    . ziprasidone (GEODON) 60 MG capsule Take 180 mg by mouth every evening. After dinner      Musculoskeletal: Strength & Muscle  Tone: decreased Gait & Station: unsteady Patient leans: Backward  Psychiatric Specialty Exam: Physical Exam Vitals and nursing note reviewed. Exam conducted with a chaperone present.  Constitutional:      Appearance: Normal appearance. She is normal weight.  HENT:     Nose: Nose normal.     Mouth/Throat:     Mouth: Mucous membranes are moist.  Cardiovascular:     Rate and Rhythm: Normal rate.  Pulmonary:     Effort: Pulmonary effort is normal.  Musculoskeletal:        General: Tenderness present.     Cervical back: Normal range of motion and neck supple.  Neurological:     Mental Status: She is alert and oriented to person, place, and time. Mental status is at baseline.  Psychiatric:        Attention and Perception: Attention and perception normal.        Mood and Affect: Mood is anxious.        Speech: Speech is rapid and pressured.        Behavior: Behavior normal. Behavior is cooperative.        Thought Content: Thought content normal.        Cognition and Memory: Cognition normal.        Judgment: Judgment is inappropriate.     Review of Systems  Blood pressure 106/75, pulse 97, temperature 98.5 F (36.9 C), temperature source Oral, resp. rate 18, height 5\' 4"  (1.626 m), weight 75.3 kg, SpO2 97 %.Body mass index is 28.49 kg/m.  General Appearance: Casual and Fairly Groomed  Eye Contact:  Good  Speech:  Stuttering  Volume:  Increased  Mood:  Anxious  Affect:  Congruent  Thought Process:  Coherent  Orientation:  Full (Time, Place, and Person)  Thought Content:  Logical, Rumination and Tangential  Suicidal Thoughts:  No  Homicidal Thoughts:  No  Memory:  Immediate;   Good Recent;   Good Remote;  Good  Judgement:  Poor  Insight:  Lacking  Psychomotor Activity:  Normal  Concentration:  Concentration: Fair and Attention Span: Fair  Recall:  Good  Fund of Knowledge:  Fair  Language:  Fair  Akathisia:  Negative  Handed:  Right  AIMS (if indicated):     Assets:   Communication Skills Desire for Improvement Physical Health Resilience Social Support  ADL's:  Intact  Cognition:  WNL  Sleep:    Insomnia     Treatment Plan Summary: Daily contact with patient to assess and evaluate symptoms and progress in treatment, Medication management and Plan The patient remained under observation overnight and will be reassessed in the a.m. to determine if she meets the criteria for psychiatric inpatient admission she could be discharged back home.  Disposition: Supportive therapy provided about ongoing stressors. The patient remained under observation overnight and will be reassessed in the a.m. to determine if she meets the criteria for psychiatric inpatient admission; she could be discharged back home.  Gillermo MurdochJacqueline Jariah Tarkowski, NP 06/25/2020 9:39 PM

## 2020-06-25 NOTE — ED Notes (Signed)
Per poison control. Case is closed.

## 2020-06-25 NOTE — ED Triage Notes (Signed)
FIRST NURSE NOTE: Pt here via wheelchair with family reports ingesting bleach within the last 30 minutes prior to arrival. Unknown if anything else has been ingested, husband with patient.

## 2020-06-25 NOTE — ED Notes (Signed)
Pt's husband can remain with her as long as she is medical. At such time as she is IVC (if she is IVC), he understands that he will not be able to remain with her.

## 2020-06-25 NOTE — BH Assessment (Signed)
Assessment Note  Jennifer Bright is an 54 y.o. female presenting to [facility] ED voluntarily. Per triage note Pt via pov from home in wheelchair after ingesting "more than a half a glass" of bleach. Pt denies si; states that she "just wanted to sleep." She called her husband, who brought her to ED immediately. Pt has suffered from depression for years and has tried to end her life before by taking pills. Pt also tried to cut her wrist once. Pt alert; states she feels burning in her stomach and throat. During assessment patient is presenting with her husband, patient appears alert and oriented x4, calm and cooperative, and pleasant. When asked why patient was presenting to the ED patient reported "I haven't been sleeping, I sleep but I don't sleep to where I feel rested, I only take Melatonin at night, and I saw this guy drink bleach and I thought it was going to help me sleep, it just burned my throat a little." Patient reported having a psychiatrist and therapist and reports taking her medications as prescribed. Patient denies that her drinking bleach was an attempt to hurt herself, patient does however have a history of an attempt in the past by overdose. Patient reported her last hospitalization being in April or May of last year. Per patient's chart review patient was admitted in 09/2019 and 05/2019 for intentional overdose and suicidal ideations. Patient currently denies SI/HI/AH/VH and does not appear to be responding to any internal or external stimuli.  Per Psyc NP Elenore Paddy, patient will be observed overnight and reassessed in the morning  Diagnosis: Bipolar Disorder, Depression  Past Medical History:  Past Medical History:  Diagnosis Date  . Acid reflux   . Anemia   . Anxiety   . Bipolar disorder (HCC)   . Cataract   . Chronic kidney disease    STAGE 3  . Depression   . Diabetes mellitus    NIDD  . Essential tremor   . Family history of breast cancer    doesn't meet medicare  guidelines for cancer genetic testing.   . Family history of ovarian cancer    Pt doesn't meet Medicare genetic testing guidelines  . Hyperlipidemia   . Hypothyroid   . IBS (irritable bowel syndrome)   . Migraine headache    vestibular migraine  . Obesity   . Osteopenia   . Restless leg syndrome   . Sarcoidosis   . Sleep apnea   . Syncope \    Past Surgical History:  Procedure Laterality Date  . ABDOMINAL HYSTERECTOMY    . APPENDECTOMY    . BLADDER SURGERY     bladder tact  . CATARACT EXTRACTION W/PHACO Right 09/21/2018   Procedure: CATARACT EXTRACTION PHACO AND INTRAOCULAR LENS PLACEMENT (IOC);  Surgeon: Nevada Crane, MD;  Location: [facility] ORS;  Service: Ophthalmology;  Laterality: Right;  [place]  00:20 CDE 00.82 Fluid pack lot # [id number]  . CATARACT EXTRACTION W/PHACO Left 10/17/2018   Procedure: CATARACT EXTRACTION PHACO AND INTRAOCULAR LENS PLACEMENT (IOC)  LEFT;  Surgeon: Nevada Crane, MD;  Location: [facility];  Service: Ophthalmology;  Laterality: Left;  diabetic - diet controlled  . CESAREAN SECTION    . CHOLECYSTECTOMY    . COLONOSCOPY WITH PROPOFOL N/A 04/05/2017   Procedure: COLONOSCOPY WITH PROPOFOL;  Surgeon: Christena Deem, MD;  Location: [facility] ENDOSCOPY;  Service: Endoscopy;  Laterality: N/A;  . ESOPHAGOGASTRODUODENOSCOPY (EGD) WITH PROPOFOL N/A 08/28/2016   Procedure: ESOPHAGOGASTRODUODENOSCOPY (EGD) WITH PROPOFOL;  Surgeon:  Christena Deem, MD;  Location: [facility] ENDOSCOPY;  Service: Endoscopy;  Laterality: N/A;  . HERNIA REPAIR    . INGUINAL HERNIA REPAIR     rt and left  . lexiscan cardiolite    . TILT TABLE STUDY N/A 04/01/2012   Procedure: TILT TABLE STUDY;  Surgeon: Duke Salvia, MD;  Location: [facility] CATH LAB;  Service: Cardiovascular;  Laterality: N/A;    Family History:  Family History  Problem Relation Age of Onset  . Colon cancer Father   . Stomach cancer Father   . Hypertension Mother   . Hyperlipidemia Mother   . Stroke Mother    . Alcohol abuse Brother   . Irritable bowel syndrome Sister   . Breast cancer Paternal Aunt   . Ovarian cancer Paternal Aunt   . Cancer Cousin   . Cancer Cousin   . Ovarian cancer Cousin   . Kidney cancer Neg Hx   . Kidney disease Neg Hx   . Prostate cancer Neg Hx     Social History:  reports that she quit smoking about 3 years ago. Her smoking use included cigarettes. She has never used smokeless tobacco. She reports that she does not drink alcohol and does not use drugs.  Additional Social History:  Alcohol / Drug Use Pain Medications: See MAR Prescriptions: See MAR Over the Counter: See MAR History of alcohol / drug use?: No history of alcohol / drug abuse  CIWA: CIWA-Ar BP: 106/75 Pulse Rate: 97 COWS:    Allergies:  Allergies  Allergen Reactions  . Cleocin [Clindamycin Hcl] Other (See Comments)    GI distress    Home Medications: (Not in a hospital admission)   OB/GYN Status:  No LMP recorded. Patient has had a hysterectomy.  General Assessment Data Location of Assessment: [facility] ED TTS Assessment: In system Is this a Tele or Face-to-Face Assessment?: Face-to-Face Is this an Initial Assessment or a Re-assessment for this encounter?: Initial Assessment Patient Accompanied by:: Other (Husband) Language Other than English: No Living Arrangements: Other (Comment) What gender do you identify as?: Female Marital status: Married Pregnancy Status: No Living Arrangements: Spouse/significant other Can pt return to current living arrangement?: Yes Admission Status: Voluntary Is patient capable of signing voluntary admission?: Yes Referral Source: Other Insurance type: UHC  Medical Screening Exam [facility] Walk-in ONLY) Medical Exam completed: Yes  Crisis Care Plan Living Arrangements: Spouse/significant other Legal Guardian: Other: (Self) Name of Psychiatrist: Dr. Chestine Spore Name of Therapist: Unknown name  Education Status Is patient currently in school?: No Is the  patient employed, unemployed or receiving disability?: Receiving disability income  Risk to self with the past 6 months Suicidal Ideation: No Has patient been a risk to self within the past 6 months prior to admission? : No Suicidal Intent: No Has patient had any suicidal intent within the past 6 months prior to admission? : No Is patient at risk for suicide?: No Suicidal Plan?: No Has patient had any suicidal plan within the past 6 months prior to admission? : No Access to Means: No What has been your use of drugs/alcohol within the last 12 months?: None Previous Attempts/Gestures: Yes How many times?: 1 Other Self Harm Risks: None Triggers for Past Attempts: None known Intentional Self Injurious Behavior: None Family Suicide History: Unknown Recent stressful life event(s): Other (Comment) (None reported) Persecutory voices/beliefs?: No Depression: Yes Depression Symptoms: Isolating Substance abuse history and/or treatment for substance abuse?: No Suicide prevention information given to non-admitted patients: Not applicable  Risk to  Others within the past 6 months Homicidal Ideation: No Does patient have any lifetime risk of violence toward others beyond the six months prior to admission? : No Thoughts of Harm to Others: No Current Homicidal Intent: No Current Homicidal Plan: No Access to Homicidal Means: No Identified Victim: None History of harm to others?: No Assessment of Violence: None Noted Violent Behavior Description: None Does patient have access to weapons?: No Criminal Charges Pending?: No Does patient have a court date: No Is patient on probation?: No  Psychosis Hallucinations: None noted Delusions: None noted  Mental Status Report Appearance/Hygiene: Unremarkable Eye Contact: Good Motor Activity: Freedom of movement Speech: Rapid, Logical/coherent Level of Consciousness: Alert Mood: Pleasant Affect: Appropriate to circumstance Anxiety Level:  Moderate Thought Processes: Coherent Judgement: Unimpaired Orientation: Person, Place, Time, Situation, Appropriate for developmental age Obsessive Compulsive Thoughts/Behaviors: None  Cognitive Functioning Concentration: Normal Memory: Recent Intact, Remote Intact Is patient IDD: No Insight: Fair Impulse Control: Poor Appetite: Fair Have you had any weight changes? : No Change Sleep: Decreased Total Hours of Sleep: 0 Vegetative Symptoms: None  ADLScreening [facility] Assessment Services) Patient's cognitive ability adequate to safely complete daily activities?: Yes Patient able to express need for assistance with ADLs?: Yes Independently performs ADLs?: No  Prior Inpatient Therapy Prior Inpatient Therapy: Yes Prior Therapy Dates: 09/2019,05/2019 Prior Therapy Facilty/Provider(s): [facility], [facility] Reason for Treatment: Depression, SI  Prior Outpatient Therapy Prior Outpatient Therapy: Yes Prior Therapy Dates: Currently Prior Therapy Facilty/Provider(s): Dr. Chestine Spore Reason for Treatment: Depression, Bipolar Does patient have an ACCT team?: No Does patient have Intensive In-House Services?  : No Does patient have [facility] services? : No Does patient have P4CC services?: No  ADL Screening (condition at time of admission) Patient's cognitive ability adequate to safely complete daily activities?: Yes Is the patient deaf or have difficulty hearing?: No Does the patient have difficulty seeing, even when wearing glasses/contacts?: No Does the patient have difficulty concentrating, remembering, or making decisions?: No Patient able to express need for assistance with ADLs?: Yes Does the patient have difficulty dressing or bathing?: No Independently performs ADLs?: No Communication: Independent Dressing (OT): Independent Grooming: Independent Feeding: Independent Bathing: Independent Toileting: Independent In/Out Bed: Needs assistance Walks in Home: Needs assistance Does the  patient have difficulty walking or climbing stairs?: Yes Weakness of Legs: None Weakness of Arms/Hands: None  Home Assistive Devices/Equipment Home Assistive Devices/Equipment: Wheelchair  Therapy Consults (therapy consults require a physician order) PT Evaluation Needed: No OT Evalulation Needed: No SLP Evaluation Needed: No Abuse/Neglect Assessment (Assessment to be complete while patient is alone) Abuse/Neglect Assessment Can Be Completed: Yes Physical Abuse: Denies Verbal Abuse: Denies Sexual Abuse: Denies Exploitation of patient/patient's resources: Denies Self-Neglect: Denies Values / Beliefs Cultural Requests During Hospitalization: None Spiritual Requests During Hospitalization: None Consults Spiritual Care Consult Needed: No Transition of Care Team Consult Needed: No Advance Directives (For Healthcare) Does Patient Have a Medical Advance Directive?: No          Disposition: Per Psyc NP Elenore Paddy, patient will be observed overnight and reassessed in the morning Disposition Initial Assessment Completed for this Encounter: Yes  On Site Evaluation by:   Reviewed with Physician:    Benay Pike MS LCASA 06/25/2020 10:30 PM

## 2020-06-26 ENCOUNTER — Ambulatory Visit: Payer: Medicare Other | Admitting: Internal Medicine

## 2020-06-26 ENCOUNTER — Encounter: Payer: Self-pay | Admitting: Internal Medicine

## 2020-06-26 ENCOUNTER — Inpatient Hospital Stay
Admission: RE | Admit: 2020-06-26 | Discharge: 2020-06-28 | DRG: 885 | Disposition: A | Discharge status: Home / Self Care | Payer: Medicare Other | Source: Intra-hospital | Attending: Psychiatry | Admitting: Psychiatry

## 2020-06-26 ENCOUNTER — Other Ambulatory Visit: Payer: Self-pay

## 2020-06-26 DIAGNOSIS — M8580 Other specified disorders of bone density and structure, unspecified site: Secondary | ICD-10-CM | POA: Diagnosis present

## 2020-06-26 DIAGNOSIS — F603 Borderline personality disorder: Secondary | ICD-10-CM | POA: Diagnosis present

## 2020-06-26 DIAGNOSIS — Z79890 Hormone replacement therapy (postmenopausal): Secondary | ICD-10-CM | POA: Diagnosis not present

## 2020-06-26 DIAGNOSIS — F333 Major depressive disorder, recurrent, severe with psychotic symptoms: Secondary | ICD-10-CM | POA: Insufficient documentation

## 2020-06-26 DIAGNOSIS — G43909 Migraine, unspecified, not intractable, without status migrainosus: Secondary | ICD-10-CM | POA: Diagnosis present

## 2020-06-26 DIAGNOSIS — Z915 Personal history of self-harm: Secondary | ICD-10-CM | POA: Diagnosis not present

## 2020-06-26 DIAGNOSIS — I129 Hypertensive chronic kidney disease with stage 1 through stage 4 chronic kidney disease, or unspecified chronic kidney disease: Secondary | ICD-10-CM | POA: Diagnosis present

## 2020-06-26 DIAGNOSIS — G4700 Insomnia, unspecified: Secondary | ICD-10-CM | POA: Diagnosis present

## 2020-06-26 DIAGNOSIS — Z87891 Personal history of nicotine dependence: Secondary | ICD-10-CM

## 2020-06-26 DIAGNOSIS — K219 Gastro-esophageal reflux disease without esophagitis: Secondary | ICD-10-CM | POA: Diagnosis present

## 2020-06-26 DIAGNOSIS — F315 Bipolar disorder, current episode depressed, severe, with psychotic features: Principal | ICD-10-CM | POA: Diagnosis present

## 2020-06-26 DIAGNOSIS — F319 Bipolar disorder, unspecified: Secondary | ICD-10-CM | POA: Diagnosis present

## 2020-06-26 DIAGNOSIS — G2581 Restless legs syndrome: Secondary | ICD-10-CM | POA: Diagnosis present

## 2020-06-26 DIAGNOSIS — Z6828 Body mass index (BMI) 28.0-28.9, adult: Secondary | ICD-10-CM

## 2020-06-26 DIAGNOSIS — E1122 Type 2 diabetes mellitus with diabetic chronic kidney disease: Secondary | ICD-10-CM | POA: Diagnosis present

## 2020-06-26 DIAGNOSIS — E039 Hypothyroidism, unspecified: Secondary | ICD-10-CM | POA: Diagnosis present

## 2020-06-26 DIAGNOSIS — F411 Generalized anxiety disorder: Secondary | ICD-10-CM | POA: Diagnosis present

## 2020-06-26 DIAGNOSIS — G4730 Sleep apnea, unspecified: Secondary | ICD-10-CM | POA: Diagnosis present

## 2020-06-26 DIAGNOSIS — N183 Chronic kidney disease, stage 3 unspecified: Secondary | ICD-10-CM | POA: Diagnosis present

## 2020-06-26 DIAGNOSIS — Z79899 Other long term (current) drug therapy: Secondary | ICD-10-CM

## 2020-06-26 DIAGNOSIS — Z881 Allergy status to other antibiotic agents status: Secondary | ICD-10-CM | POA: Diagnosis not present

## 2020-06-26 DIAGNOSIS — T5491XA Toxic effect of unspecified corrosive substance, accidental (unintentional), initial encounter: Secondary | ICD-10-CM

## 2020-06-26 DIAGNOSIS — I10 Essential (primary) hypertension: Secondary | ICD-10-CM | POA: Diagnosis present

## 2020-06-26 DIAGNOSIS — Z8249 Family history of ischemic heart disease and other diseases of the circulatory system: Secondary | ICD-10-CM | POA: Diagnosis not present

## 2020-06-26 DIAGNOSIS — E785 Hyperlipidemia, unspecified: Secondary | ICD-10-CM | POA: Diagnosis present

## 2020-06-26 DIAGNOSIS — Z20822 Contact with and (suspected) exposure to covid-19: Secondary | ICD-10-CM | POA: Diagnosis present

## 2020-06-26 DIAGNOSIS — D869 Sarcoidosis, unspecified: Secondary | ICD-10-CM | POA: Diagnosis present

## 2020-06-26 DIAGNOSIS — E669 Obesity, unspecified: Secondary | ICD-10-CM | POA: Diagnosis present

## 2020-06-26 LAB — URINE DRUG SCREEN, QUALITATIVE (ARMC ONLY)
Amphetamines, Ur Screen: POSITIVE — AB
Barbiturates, Ur Screen: POSITIVE — AB
Benzodiazepine, Ur Scrn: POSITIVE — AB
Cannabinoid 50 Ng, Ur ~~LOC~~: NOT DETECTED
Cocaine Metabolite,Ur ~~LOC~~: NOT DETECTED
MDMA (Ecstasy)Ur Screen: NOT DETECTED
Methadone Scn, Ur: NOT DETECTED
Opiate, Ur Screen: NOT DETECTED
Phencyclidine (PCP) Ur S: NOT DETECTED
Tricyclic, Ur Screen: NOT DETECTED

## 2020-06-26 LAB — SARS CORONAVIRUS 2 BY RT PCR (HOSPITAL ORDER, PERFORMED IN ~~LOC~~ HOSPITAL LAB): SARS Coronavirus 2: NEGATIVE

## 2020-06-26 LAB — POCT PREGNANCY, URINE: Preg Test, Ur: NEGATIVE

## 2020-06-26 MED ORDER — MELATONIN 5 MG PO TABS
10.0000 mg | ORAL_TABLET | Freq: Every day | ORAL | Status: DC
  Administered 2020-06-26: 10 mg via ORAL
  Filled 2020-06-26 (×3): qty 2

## 2020-06-26 MED ORDER — ESCITALOPRAM OXALATE 10 MG PO TABS
20.0000 mg | ORAL_TABLET | Freq: Every day | ORAL | Status: DC
  Administered 2020-06-27 – 2020-06-28 (×2): 20 mg via ORAL
  Filled 2020-06-26 (×2): qty 2

## 2020-06-26 MED ORDER — OSPEMIFENE 60 MG PO TABS
1.0000 | ORAL_TABLET | Freq: Every day | ORAL | Status: DC
Start: 2020-06-26 — End: 2020-06-26

## 2020-06-26 MED ORDER — BUPROPION HCL ER (XL) 150 MG PO TB24: 300.0000 mg | ORAL_TABLET | Freq: Every day | ORAL | Status: DC

## 2020-06-26 MED ORDER — BENZTROPINE MESYLATE 1 MG PO TABS
1.0000 mg | ORAL_TABLET | Freq: Two times a day (BID) | ORAL | Status: DC
  Administered 2020-06-26 – 2020-06-28 (×4): 1 mg via ORAL
  Filled 2020-06-26 (×4): qty 1

## 2020-06-26 MED ORDER — ATORVASTATIN CALCIUM 20 MG PO TABS: 40.0000 mg | ORAL_TABLET | Freq: Every day | ORAL | Status: DC

## 2020-06-26 MED ORDER — DOCUSATE SODIUM 100 MG PO CAPS
100.0000 mg | ORAL_CAPSULE | Freq: Every day | ORAL | Status: DC
  Administered 2020-06-27 – 2020-06-28 (×2): 100 mg via ORAL
  Filled 2020-06-26 (×2): qty 1

## 2020-06-26 MED ORDER — OMEGA-3-ACID ETHYL ESTERS 1 G PO CAPS
1.0000 g | ORAL_CAPSULE | Freq: Every day | ORAL | Status: DC
  Administered 2020-06-27 – 2020-06-28 (×2): 1 g via ORAL
  Filled 2020-06-26 (×2): qty 1

## 2020-06-26 MED ORDER — VITAMIN D 25 MCG (1000 UNIT) PO TABS
2000.0000 [IU] | ORAL_TABLET | Freq: Every day | ORAL | Status: DC
  Administered 2020-06-26: 2000 [IU] via ORAL
  Filled 2020-06-26: qty 2

## 2020-06-26 MED ORDER — VITAMIN B-12 1000 MCG PO TABS
2500.0000 ug | ORAL_TABLET | Freq: Every day | ORAL | Status: DC
  Administered 2020-06-27 – 2020-06-28 (×2): 2500 ug via ORAL
  Filled 2020-06-26 (×2): qty 3

## 2020-06-26 MED ORDER — ALPRAZOLAM 0.5 MG PO TABS
1.0000 mg | ORAL_TABLET | Freq: Every day | ORAL | Status: DC
  Administered 2020-06-26: 1 mg via ORAL
  Filled 2020-06-26: qty 2

## 2020-06-26 MED ORDER — CLONAZEPAM 0.5 MG PO TABS
0.5000 mg | ORAL_TABLET | Freq: Two times a day (BID) | ORAL | Status: DC
  Administered 2020-06-26: 0.5 mg via ORAL
  Filled 2020-06-26: qty 1

## 2020-06-26 MED ORDER — LEVOTHYROXINE SODIUM 50 MCG PO TABS: 100.0000 ug | ORAL_TABLET | Freq: Every day | ORAL | Status: DC

## 2020-06-26 MED ORDER — BUPROPION HCL ER (XL) 150 MG PO TB24
300.0000 mg | ORAL_TABLET | Freq: Every day | ORAL | Status: DC
  Administered 2020-06-27 – 2020-06-28 (×2): 300 mg via ORAL
  Filled 2020-06-26 (×2): qty 2

## 2020-06-26 MED ORDER — TOPIRAMATE 25 MG PO TABS: 200.0000 mg | ORAL_TABLET | Freq: Two times a day (BID) | ORAL | Status: DC

## 2020-06-26 MED ORDER — TRAZODONE HCL 50 MG PO TABS
50.0000 mg | ORAL_TABLET | Freq: Every evening | ORAL | Status: DC | PRN
  Administered 2020-06-26 – 2020-06-27 (×2): 50 mg via ORAL
  Filled 2020-06-26 (×2): qty 1

## 2020-06-26 MED ORDER — MAGNESIUM HYDROXIDE 400 MG/5ML PO SUSP: 30.0000 mL | Freq: Every day | ORAL | Status: DC | PRN

## 2020-06-26 MED ORDER — PANTOPRAZOLE SODIUM 40 MG PO TBEC
40.0000 mg | DELAYED_RELEASE_TABLET | Freq: Every day | ORAL | Status: DC
  Administered 2020-06-26: 40 mg via ORAL
  Filled 2020-06-26: qty 1

## 2020-06-26 MED ORDER — BENZTROPINE MESYLATE 1 MG PO TABS
1.0000 mg | ORAL_TABLET | Freq: Two times a day (BID) | ORAL | Status: DC
  Administered 2020-06-26: 1 mg via ORAL
  Filled 2020-06-26: qty 1

## 2020-06-26 MED ORDER — ZIPRASIDONE HCL 40 MG PO CAPS
60.0000 mg | ORAL_CAPSULE | Freq: Every day | ORAL | Status: DC
  Administered 2020-06-27: 60 mg via ORAL
  Filled 2020-06-26: qty 1

## 2020-06-26 MED ORDER — ACETAMINOPHEN 325 MG PO TABS
650.0000 mg | ORAL_TABLET | Freq: Four times a day (QID) | ORAL | Status: DC | PRN
  Administered 2020-06-28: 650 mg via ORAL
  Filled 2020-06-26: qty 2

## 2020-06-26 MED ORDER — LEVOTHYROXINE SODIUM 50 MCG PO TABS
100.0000 ug | ORAL_TABLET | Freq: Every day | ORAL | Status: DC
  Administered 2020-06-27 – 2020-06-28 (×2): 100 ug via ORAL
  Filled 2020-06-26 (×2): qty 2

## 2020-06-26 MED ORDER — ALPRAZOLAM ER 1 MG PO TB24
1.0000 mg | ORAL_TABLET | Freq: Once | ORAL | Status: AC
  Administered 2020-06-26: 1 mg via ORAL
  Filled 2020-06-26: qty 1

## 2020-06-26 MED ORDER — DIPHENHYDRAMINE HCL 25 MG PO CAPS
50.0000 mg | ORAL_CAPSULE | Freq: Once | ORAL | Status: AC
  Administered 2020-06-26: 50 mg via ORAL
  Filled 2020-06-26: qty 2

## 2020-06-26 MED ORDER — TRAZODONE HCL 50 MG PO TABS
50.0000 mg | ORAL_TABLET | Freq: Every day | ORAL | Status: DC
  Administered 2020-06-26: 50 mg via ORAL

## 2020-06-26 MED ORDER — VITAMIN B-12 1000 MCG PO TABS
2500.0000 ug | ORAL_TABLET | Freq: Every day | ORAL | Status: DC
  Filled 2020-06-26: qty 2.5

## 2020-06-26 MED ORDER — DOCUSATE SODIUM 100 MG PO CAPS: 100.0000 mg | ORAL_CAPSULE | Freq: Two times a day (BID) | ORAL | Status: DC

## 2020-06-26 MED ORDER — LINACLOTIDE 145 MCG PO CAPS
145.0000 ug | ORAL_CAPSULE | Freq: Every day | ORAL | Status: DC
  Administered 2020-06-27 – 2020-06-28 (×2): 145 ug via ORAL
  Filled 2020-06-26 (×2): qty 1

## 2020-06-26 MED ORDER — PANTOPRAZOLE SODIUM 40 MG PO TBEC
40.0000 mg | DELAYED_RELEASE_TABLET | Freq: Every day | ORAL | Status: DC
  Administered 2020-06-27 – 2020-06-28 (×2): 40 mg via ORAL
  Filled 2020-06-26 (×2): qty 1

## 2020-06-26 MED ORDER — TOPIRAMATE 100 MG PO TABS
200.0000 mg | ORAL_TABLET | Freq: Two times a day (BID) | ORAL | Status: DC
  Administered 2020-06-26 – 2020-06-28 (×4): 200 mg via ORAL
  Filled 2020-06-26 (×4): qty 2

## 2020-06-26 MED ORDER — ALUM & MAG HYDROXIDE-SIMETH 200-200-20 MG/5ML PO SUSP: 30.0000 mL | ORAL | Status: DC | PRN

## 2020-06-26 MED ORDER — MELATONIN 5 MG PO TABS
10.0000 mg | ORAL_TABLET | Freq: Every day | ORAL | Status: DC
  Administered 2020-06-26 – 2020-06-27 (×2): 10 mg via ORAL
  Filled 2020-06-26 (×2): qty 2

## 2020-06-26 MED ORDER — LINACLOTIDE 145 MCG PO CAPS
145.0000 ug | ORAL_CAPSULE | Freq: Every day | ORAL | Status: DC
  Filled 2020-06-26: qty 1

## 2020-06-26 MED ORDER — OLANZAPINE 5 MG PO TABS: 2.5000 mg | ORAL_TABLET | Freq: Every day | ORAL | Status: DC

## 2020-06-26 MED ORDER — DSS 100 MG PO CAPS: 1.0000 "application " | ORAL_CAPSULE | Freq: Two times a day (BID) | ORAL | Status: DC

## 2020-06-26 MED ORDER — ATORVASTATIN CALCIUM 20 MG PO TABS
40.0000 mg | ORAL_TABLET | Freq: Every day | ORAL | Status: DC
  Administered 2020-06-26 – 2020-06-27 (×2): 40 mg via ORAL
  Filled 2020-06-26 (×2): qty 2

## 2020-06-26 MED ORDER — GLUCOSE BLOOD VI STRP
1.0000 | ORAL_STRIP | Status: DC | PRN
  Filled 2020-06-26: qty 1

## 2020-06-26 MED ORDER — DOXEPIN HCL 25 MG PO CAPS
25.0000 mg | ORAL_CAPSULE | Freq: Every day | ORAL | Status: DC
  Filled 2020-06-26: qty 1

## 2020-06-26 MED ORDER — ALPRAZOLAM 0.5 MG PO TABS: 1.0000 mg | ORAL_TABLET | Freq: Every day | ORAL | Status: DC

## 2020-06-26 NOTE — ED Notes (Signed)

## 2020-06-26 NOTE — ED Notes (Addendum)
Patient became upset and was asking to be transferred to [facility] in [place] or leave AMA. Pt states she is upset and feels like she is not going to get the attention she needs here due to past issues. Patient states she has watched another patient lay in the hallway bed and how he is just laying there and looks like no one is helping him.  This Clinical research associate explained due to HIPPA I could not tell her about other patient and their care, but asked patient if so far has she not received or feels like she has not received proper care. Patient states, "No you have been very good and gave me the care that I have needed."  This writer explained I would need to talk with my charge RN and Psych NP about transferring to [facility]. Spoke with Jeannett Senior RN about patient and consulted Annice Pih NP.  Annice Pih NP came and spoke with patient and patient agreed to stay.  Patient wanted to stay in hallway in recliner instead of going into a room due to she was worried if she needed anything no one would help her.  This writer told patient she could stay in hallway and that I would be right in the area if she needed anything.   Medications was given to patient and patient currently resting with eyes closed. Respirations even and non labored. Will continue to monitor.

## 2020-06-26 NOTE — Consult Note (Signed)
[facility] Face-to-Face Psychiatry Consult   Reason for Consult:    Voluntary --admission  Post drinking bleach History of borderline personality ADHD combined and Major depression severe recurrent possible psychosis  Referring Physician:  ER MD   Patient Identification: Jennifer Bright MRN:  [id number] Principal Diagnosis: <principal problem not specified>   Major Depression Severe Recurrent with psychosis  Generalized anxiety  Borderline personality Disorder  Possible sleep disorder   Diagnosis:  Active Problems:   Depression   Anxiety   Bipolar depression (HCC)   Anxiety disorder   Bipolar disorder (HCC)   Bipolar 1 disorder, manic, moderate (HCC)   Bipolar 2 disorder, major depressive episode (HCC)   Self-inflicted laceration of left wrist (HCC)   Total Time spent with patient:  One hour  Including collateral with husband   Subjective:   Jennifer YGLESIAS is a 54 y.o. female patient admitted with    Depression with psychosis  Post drinking bleach   HPI:  Patient ---is in a strange state She drank bleach in a psychotic manner  She blames it on poor sleep however she has major depression ---but now also has psychosis  Illogical thought and very poor judgement  Drank bleach while watching a TV show ----where someone was drinking a white bottle --and she imitated the TV and took a Clorox bottle   Patient has very poor insight ---and judgement ---on this matter and is possibly in a psychotic state    She needs an admission for further obs and all  Spoke ---with Husband at length who also explained the story but he also is not so concerned as much as--the Clorox     Past Psychiatric History:  Two previous admissions ----for similar reasons ---but not psychosis related    Substance drug ETOH   None  Court and Legal none  Family history --parents with depression  Social ---lives with husband of thirty years  Gets triggered with arguments with grown kids.    Education   --HS   Work ---stays at home on disability   Sees Dr. Evelene Croon at [facility] for outpatient Next appointment in a few weeks    Risk to Self:   High   In psychotic state drinking bleach ---not clear if it is suicide or psychotic symptoms   Risk to Others: Homicidal Ideation: No Thoughts of Harm to Others: No Current Homicidal Intent: No Current Homicidal Plan: No Access to Homicidal Means: No Identified Victim: None History of harm to others?: No Assessment of Violence: None Noted Violent Behavior Description: None Does patient have access to weapons?: No Criminal Charges Pending?: No Does patient have a court date: No Prior Inpatient Therapy: Prior Inpatient Therapy: Yes Prior Therapy Dates: 09/2019,05/2019 Prior Therapy Facilty/Provider(s): [facility], [facility] Reason for Treatment: Depression, SI Prior Outpatient Therapy: Prior Outpatient Therapy: Yes Prior Therapy Dates: Currently Prior Therapy Facilty/Provider(s): Dr. Chestine Spore Reason for Treatment: Depression, Bipolar Does patient have an ACCT team?: No Does patient have Intensive In-House Services?  : No Does patient have [facility] services? : No Does patient have P4CC services?: No  Past Medical History:  Past Medical History:  Diagnosis Date  . Acid reflux   . Anemia   . Anxiety   . Bipolar disorder (HCC)   . Cataract   . Chronic kidney disease    STAGE 3  . Depression   . Diabetes mellitus    NIDD  . Essential tremor   . Family history of breast cancer    doesn't  meet medicare guidelines for cancer genetic testing.   . Family history of ovarian cancer    Pt doesn't meet Medicare genetic testing guidelines  . Hyperlipidemia   . Hypothyroid   . IBS (irritable bowel syndrome)   . Migraine headache    vestibular migraine  . Obesity   . Osteopenia   . Restless leg syndrome   . Sarcoidosis   . Sleep apnea   . Syncope \    Past Surgical History:  Procedure Laterality Date  . ABDOMINAL HYSTERECTOMY     . APPENDECTOMY    . BLADDER SURGERY     bladder tact  . CATARACT EXTRACTION W/PHACO Right 09/21/2018   Procedure: CATARACT EXTRACTION PHACO AND INTRAOCULAR LENS PLACEMENT (IOC);  Surgeon: Nevada Crane, MD;  Location: [facility] ORS;  Service: Ophthalmology;  Laterality: Right;  [place]  00:20 CDE 00.82 Fluid pack lot # [id number]  . CATARACT EXTRACTION W/PHACO Left 10/17/2018   Procedure: CATARACT EXTRACTION PHACO AND INTRAOCULAR LENS PLACEMENT (IOC)  LEFT;  Surgeon: Nevada Crane, MD;  Location: [facility];  Service: Ophthalmology;  Laterality: Left;  diabetic - diet controlled  . CESAREAN SECTION    . CHOLECYSTECTOMY    . COLONOSCOPY WITH PROPOFOL N/A 04/05/2017   Procedure: COLONOSCOPY WITH PROPOFOL;  Surgeon: Christena Deem, MD;  Location: [facility] ENDOSCOPY;  Service: Endoscopy;  Laterality: N/A;  . ESOPHAGOGASTRODUODENOSCOPY (EGD) WITH PROPOFOL N/A 08/28/2016   Procedure: ESOPHAGOGASTRODUODENOSCOPY (EGD) WITH PROPOFOL;  Surgeon: Christena Deem, MD;  Location: [facility] END[facility]COPY;  Service: Endoscopy;  Laterality: N/A;  . HERNIA REPAIR    . INGUINAL HERNIA REPAIR     rt and left  . lexiscan cardiolite    . TILT TABLE STUDY N/A 04/01/2012   Procedure: TILT TABLE STUDY;  Surgeon: Duke Salvia, MD;  Location: [facility] CATH LAB;  Service: Cardiovascular;  Laterality: N/A;   Family History:     See above  Family History  Problem Relation Age of Onset  . Colon cancer Father   . Stomach cancer Father   . Hypertension Mother   . Hyperlipidemia Mother   . Stroke Mother   . Alcohol abuse Brother   . Irritable bowel syndrome Sister   . Breast cancer Paternal Aunt   . Ovarian cancer Paternal Aunt   . Cancer Cousin   . Cancer Cousin   . Ovarian cancer Cousin   . Kidney cancer Neg Hx   . Kidney disease Neg Hx   . Prostate cancer Neg Hx    Family Psychiatric  History:  See above     Social History:  Social History   Substance and Sexual Activity  Alcohol Use No     Social  History   Substance and Sexual Activity  Drug Use No    Social History   Socioeconomic History  . Marital status: Married    Spouse name: Not on file  . Number of children: Not on file  . Years of education: Not on file  . Highest education level: Not on file  Occupational History  . Occupation: disabled  Tobacco Use  . Smoking status: Former Smoker    Types: Cigarettes    Quit date: 2018    Years since quitting: 3.6  . Smokeless tobacco: Never Used  Vaping Use  . Vaping Use: Never used  Substance and Sexual Activity  . Alcohol use: No  . Drug use: No  . Sexual activity: Yes  Other Topics Concern  . Not on file  Social History Narrative  . Not on file   Social Determinants of Health   Financial Resource Strain:   . Difficulty of Paying Living Expenses: Not on file  Food Insecurity:   . Worried About Programme researcher, broadcasting/film/video in the Last Year: Not on file  . Ran Out of Food in the Last Year: Not on file  Transportation Needs:   . Lack of Transportation (Medical): Not on file  . Lack of Transportation (Non-Medical): Not on file  Physical Activity:   . Days of Exercise per Week: Not on file  . Minutes of Exercise per Session: Not on file  Stress:   . Feeling of Stress : Not on file  Social Connections:   . Frequency of Communication with Friends and Family: Not on file  . Frequency of Social Gatherings with Friends and Family: Not on file  . Attends Religious Services: Not on file  . Active Member of Clubs or Organizations: Not on file  . Attends Banker Meetings: Not on file  . Marital Status: Not on file   Additional Social History:---none     Allergies:   Allergies  Allergen Reactions  . Cleocin [Clindamycin Hcl] Other (See Comments)    GI distress    Labs:  Results for orders placed or performed during the hospital encounter of 06/25/20 (from the past 48 hour(s))  Comprehensive metabolic panel     Status: Abnormal   Collection Time: 06/25/20   5:16 PM  Result Value Ref Range   Sodium 145 135 - 145 mmol/L   Potassium 3.5 3.5 - 5.1 mmol/L   Chloride 111 98 - 111 mmol/L   CO2 24 22 - 32 mmol/L   Glucose, Bld 119 (H) 70 - 99 mg/dL    Comment: Glucose reference range applies only to samples taken after fasting for at least 8 hours.   BUN 26 (H) 6 - 20 mg/dL   Creatinine, Ser 1.61 (H) 0.44 - 1.00 mg/dL   Calcium 09.6 8.9 - 04.5 mg/dL   Total Protein 7.6 6.5 - 8.1 g/dL   Albumin 4.6 3.5 - 5.0 g/dL   AST 21 15 - 41 U/L   ALT 30 0 - 44 U/L   Alkaline Phosphatase 61 38 - 126 U/L   Total Bilirubin 0.7 0.3 - 1.2 mg/dL   GFR calc non Af Amer 56 (L) >60 mL/min   GFR calc Af Amer >60 >60 mL/min   Anion gap 10 5 - 15    Comment: Performed at [facility], [address], [place], [place] [address]  Ethanol     Status: None   Collection Time: 06/25/20  5:16 PM  Result Value Ref Range   Alcohol, Ethyl (B) <10 <10 mg/dL    Comment: (NOTE) Lowest detectable limit for serum alcohol is 10 mg/dL.  For medical purposes only. Performed at [facility], [address] [place], [place] [address]   Salicylate level     Status: Abnormal   Collection Time: 06/25/20  5:16 PM  Result Value Ref Range   Salicylate Lvl <7.0 (L) 7.0 - 30.0 mg/dL    Comment: Performed at [facility], [address] [place], [place] [address]  Acetaminophen level     Status: Abnormal   Collection Time: 06/25/20  5:16 PM  Result Value Ref Range   Acetaminophen (Tylenol), Serum <10 (L) 10 - 30 ug/mL    Comment: (NOTE) Therapeutic concentrations vary significantly. A range of 10-30 ug/mL  may be an effective concentration for many patients. However, some  are best treated at concentrations outside of this range. Acetaminophen concentrations >150 ug/mL at 4 hours after ingestion  and >50 ug/mL at 12 hours after ingestion are often associated with  toxic reactions.  Performed at [facility], [address]  [place], [place] [address]   cbc     Status: None   Collection Time: 06/25/20  5:16 PM  Result Value Ref Range   WBC 6.4 4.0 - 10.5 K/uL   RBC 4.76 3.87 - 5.11 MIL/uL   Hemoglobin 14.4 12.0 - 15.0 g/dL   HCT 60.441.9 36 - 46 %   MCV 88.0 80.0 - 100.0 fL   MCH 30.3 26.0 - 34.0 pg   MCHC 34.4 30.0 - 36.0 g/dL   RDW 54.012.7 98.111.5 - 19.115.5 %   Platelets 213 150 - 400 K/uL   nRBC 0.0 0.0 - 0.2 %    Comment: Performed at [facility], [address] [place], [place] [address]  Urine Drug Screen, Qualitative     Status: Abnormal   Collection Time: 06/25/20  5:16 PM  Result Value Ref Range   Tricyclic, Ur Screen NONE DETECTED NONE DETECTED   Amphetamines, Ur Screen POSITIVE (A) NONE DETECTED   MDMA (Ecstasy)Ur Screen NONE DETECTED NONE DETECTED   Cocaine Metabolite,Ur [place] NONE DETECTED NONE DETECTED   Opiate, Ur Screen NONE DETECTED NONE DETECTED   Phencyclidine (PCP) Ur S NONE DETECTED NONE DETECTED   Cannabinoid 50 Ng, Ur [place] NONE DETECTED NONE DETECTED   Barbiturates, Ur Screen POSITIVE (A) NONE DETECTED   Benzodiazepine, Ur Scrn POSITIVE (A) NONE DETECTED   Methadone Scn, Ur NONE DETECTED NONE DETECTED    Comment: (NOTE) Tricyclics + metabolites, urine    Cutoff 1000 ng/mL Amphetamines + metabolites, urine  Cutoff 1000 ng/mL MDMA (Ecstasy), urine              Cutoff 500 ng/mL Cocaine Metabolite, urine          Cutoff 300 ng/mL Opiate + metabolites, urine        Cutoff 300 ng/mL Phencyclidine (PCP), urine         Cutoff 25 ng/mL Cannabinoid, urine                 Cutoff 50 ng/mL Barbiturates + metabolites, urine  Cutoff 200 ng/mL Benzodiazepine, urine              Cutoff 200 ng/mL Methadone, urine                   Cutoff 300 ng/mL  The urine drug screen provides only a preliminary, unconfirmed analytical test result and should not be used for non-medical purposes. Clinical consideration and professional judgment should be applied to any positive drug screen result due to  possible interfering substances. A more specific alternate chemical method must be used in order to obtain a confirmed analytical result. Gas chromatography / mass spectrometry (GC/MS) is the preferred confirm atory method. Performed at [facility], [address] [place], [place] [address]   TSH     Status: None   Collection Time: 06/25/20  5:16 PM  Result Value Ref Range   TSH 1.737 0.350 - 4.500 uIU/mL    Comment: Performed by a 3rd Generation assay with a functional sensitivity of <=0.01 uIU/mL. Performed at [facility], [address] [place], [place] [address]   Acetaminophen level     Status: Abnormal  Collection Time: 06/25/20  9:12 PM  Result Value Ref Range   Acetaminophen (Tylenol), Serum <10 (L) 10 - 30 ug/mL    Comment: (NOTE) Therapeutic concentrations vary significantly. A range of 10-30 ug/mL  may be an effective concentration for many patients. However, some  are best treated at concentrations outside of this range. Acetaminophen concentrations >150 ug/mL at 4 hours after ingestion  and >50 ug/mL at 12 hours after ingestion are often associated with  toxic reactions.  Performed at [facility], [address] [place], [place] [address]   Pregnancy, urine POC     Status: None   Collection Time: 06/26/20  7:24 AM  Result Value Ref Range   Preg Test, Ur NEGATIVE NEGATIVE    Comment:        THE SENSITIVITY OF THIS METHODOLOGY IS >24 mIU/mL     Current Facility-Administered Medications  Medication Dose Route Frequency Provider Last Rate Last Admin  . ALPRAZolam Prudy Feeler) tablet 1 mg  1 mg Oral Daily Arnaldo Natal, MD   1 mg at 06/26/20 0138  . atorvastatin (LIPITOR) tablet 40 mg  40 mg Oral QHS Roselind Messier, MD      . B-12 TABS 2,500 mcg  2,500 mcg Oral Daily Roselind Messier, MD      . benztropine (COGENTIN) tablet 1 mg  1 mg Oral BID Roselind Messier, MD      . buPROPion (WELLBUTRIN XL) 24 hr tablet 300 mg  300 mg  Oral Daily Roselind Messier, MD      . DSS CAPS 1 application  1 application Oral q12n4p Roselind Messier, MD      . glucose blood test strip STRP 1 each  1 each Other PRN Roselind Messier, MD      . Melene Muller ON 06/27/2020] levothyroxine (SYNTHROID) tablet 100 mcg  100 mcg Oral Q0600 Roselind Messier, MD      . Melene Muller ON 06/27/2020] linaclotide (LINZESS) capsule 145 mcg  145 mcg Oral QAC breakfast Roselind Messier, MD      . melatonin tablet 10 mg  10 mg Oral QHS Gillermo Murdoch, NP   10 mg at 06/26/20 0155  . Ospemifene TABS 1 tablet  1 tablet Oral Daily Roselind Messier, MD      . pantoprazole (PROTONIX) EC tablet 40 mg  40 mg Oral Daily Roselind Messier, MD      . topiramate (TOPAMAX) tablet 200 mg  200 mg Oral BID Roselind Messier, MD      . traZODone (DESYREL) tablet 50 mg  50 mg Oral QHS Gillermo Murdoch, NP   50 mg at 06/26/20 0139  . Vitamin D3 2,000 Units  2,000 Units Oral Daily Roselind Messier, MD       Current Outpatient Medications  Medication Sig Dispense Refill  . Accu-Chek Softclix Lancets lancets Use as instructed bid DX impaired fasting glucose 100 each 1  . ALPRAZolam (XANAX) 1 MG tablet Take 1 mg by mouth 2 (two) times daily as needed for anxiety or sleep.     Marland Kitchen ALPRAZOLAM XR 1 MG 24 hr tablet Take 1 mg by mouth 2 (two) times daily.    Marland Kitchen amphetamine-dextroamphetamine (ADDERALL XR) 30 MG 24 hr capsule Take 30 mg by mouth 2 (two) times daily.    . Armodafinil 250 MG tablet Take 250 mg by mouth daily.    Marland Kitchen atorvastatin (LIPITOR) 40 MG tablet Take 40 mg by mouth at bedtime.     . benztropine (COGENTIN) 1 MG tablet Take 1 mg  by mouth 2 (two) times daily.     Marland Kitchen buPROPion (WELLBUTRIN XL) 300 MG 24 hr tablet Take 300 mg by mouth daily.    . Cholecalciferol (VITAMIN D3) 50 MCG (2000 UT) capsule Take 2,000 Units by mouth daily.    . Cyanocobalamin (B-12) 2500 MCG TABS Take 2,500 mcg by mouth daily.    [name] 100 MG CAPS Take by mouth.    Marland Kitchen glucose blood  (ACCU-CHEK GUIDE) test strip 1 each by Other route in the morning and at bedtime. DX impaired fasting glucose 100 each 3  . levothyroxine (SYNTHROID) 100 MCG tablet TAKE 1 TABLET EVERY DAY ON EMPTY STOMACHWITH A GLASS OF WATER AT LEAST 30-60 MINBEFORE BREAKFAST 30 tablet 3  . linaclotide (LINZESS) 145 MCG CAPS capsule Take 1 capsule (145 mcg total) by mouth daily before breakfast. 30 capsule 3  . Melatonin 10 MG TABS Take 10 mg by mouth at bedtime.    . meloxicam (MOBIC) 7.5 MG tablet TAKE ONE TABLET EVERY DAY FOR CHEST PAINS FOR 3 DAYS AND THEN AS NEEDED 30 tablet 1  . methylPREDNISolone (MEDROL) 4 MG tablet Take 4 mg by mouth daily.    . Multiple Vitamin (MULTIVITAMIN) capsule Take by mouth.    . Omega-3 300 MG CAPS Take 1.2 g by mouth daily.     . Ospemifene (OSPHENA) 60 MG TABS Take 1 tablet by mouth daily. 90 tablet 3  . pantoprazole (PROTONIX) 40 MG tablet TAKE ONE TABLET EVERY DAY 15-20 MINUTES BEFORE A MEAL 30 tablet 3  . topiramate (TOPAMAX) 200 MG tablet TAKE 1 TABLET BY MOUTH IN  THE MORNING AND 1 TABLET BY MOUTH IN THE EVENING 180 tablet 3  . Vitamin E Acetate 125 UNIT/ML LIQD Take 670 Units by mouth daily.    . ziprasidone (GEODON) 60 MG capsule Take 180 mg by mouth every evening. After dinner      Musculoskeletal: Strength & Muscle Tone: normal   Gait & Station: normal  Patient leans:  na  Psychiatric Specialty Exam: Physical Exam  Review of Systems  Blood pressure 95/66, pulse 79, temperature 98 F (36.7 C), temperature source Oral, resp. rate 16, height 5\' 4"  (1.626 m), weight 75.3 kg, SpO2 98 %.Body mass index is 28.49 kg/m.                                  Mental Status  Alert cooperative  Oriented times four  Rapport and eye contact normal Consciousness not clouded or fluctuant Concentration and attention poor Speech ---normal Judgement insight reliability impaired Thought process and contact ---illogical strange odd  Memory at times there are  gaps Poor historian  SI  Not clear HI none Movements none-- Fund of knowledge intelligence --below average Abstraction fair Appearance tired and unkept                       Sleep very poor  Cognition --impaired Leans not known  Assets caring family  Recall fair to poor Akathisia none Language english   Psychomotor --activity ---normal     Treatment Plan Summary: Patient with major depression and psychosis possible sleep disorder generalized anxiety and borderline personality   Drank bleach in psychotic rather than suicidal manner  Needs inpatient to sort this all out and redo meds including antpsychotics   Husband and patient agree     Disposition: awaits Psych bed  Meds changed and adjusted    Roselind Messier, MD 06/26/2020 1:09 PM

## 2020-06-26 NOTE — BH Assessment (Addendum)
Patient is to be admitted to [facility] by Dr. Smith Robert  Attending Physician will be Dr. Toni Amend.   Patient has been assigned to room 304, by [facility] Charge Nurse Demetria.   Intake Paper Work has been signed and placed on patient chart.  ER staff is aware of the admission:  [facility] ER Secretary    Dr. Derrill Kay, ER MD   Maralyn Sago Patient's Nurse    Patient Access Staff.  Pt can be transported pending negative covid results

## 2020-06-26 NOTE — BH Assessment (Signed)
Writer spoke with the patient to complete an updated/reassessment. Patient's thought processes were illogical and were concerning. Patient denies SI/HI and AV/H. Per Dr. Smith Robert, patient meets inpatient criteria.  Collateral Jennifer Bright (Husband) [phone] Jennifer Bright explained that the patient sounded good upon visiting her today. Jennifer Bright reported that he did not have concerns about the patient's personal safety but was ok with her being admitted if that was the doctor's recommendation.

## 2020-06-26 NOTE — Tx Team (Signed)
Initial Treatment Plan 06/26/2020 10:43 PM [name] JEH:[id number]    PATIENT STRESSORS: Health problems Other: excesive worry   PATIENT STRENGTHS: Communication skills General fund of knowledge Motivation for treatment/growth   PATIENT IDENTIFIED PROBLEMS: MDD  Anxiety  patient reports excessive worry                 DISCHARGE CRITERIA:  Improved stabilization in mood, thinking, and/or behavior  PRELIMINARY DISCHARGE PLAN: Outpatient therapy  PATIENT/FAMILY INVOLVEMENT: This treatment plan has been presented to and reviewed with the patient, TRENIYAH LYNN, and/or family member, .  The patient and family have been given the opportunity to ask questions and make suggestions.  Shelia Media, RN 06/26/2020, 10:43 PM

## 2020-06-26 NOTE — ED Notes (Signed)
Dr Rao and TTS at bedside 

## 2020-06-26 NOTE — ED Provider Notes (Signed)
Emergency Medicine Observation Re-evaluation Note  Jennifer Bright is a 53 y.o. female, seen on rounds today.  Pt initially presented to the ED for complaints of Poisoning Currently, the patient is resting, voices no complaints.  Physical Exam  BP 106/75 (BP Location: Left Arm)   Pulse 97   Temp 98.5 F (36.9 C) (Oral)   Resp 18   Ht 5\' 4"  (1.626 m)   Wt 75.3 kg   SpO2 97%   BMI 28.49 kg/m  Physical Exam General: Resting in no acute distress Cardiac: No cyanosis Lungs: Equal rise and fall Psych: Normal  ED Course / MDM  EKG:    I have reviewed the labs performed to date as well as medications administered while in observation.  Recent changes in the last 24 hours include patient was irate overnight and asking to be transferred to [facility].  Psychiatric nurse practitioner spoke with the patient again and patient was agreeable to stay.  Plan  Current plan is for psychiatric hospitalization. Patient is not under full IVC at this time.   [facility], MD 06/26/20 [phone]

## 2020-06-26 NOTE — ED Notes (Signed)
Per pharmacy, med rec not completed so unsure what meds need to be given right now.

## 2020-06-26 NOTE — ED Notes (Signed)
Pt asked if she would like to take a shower but pt refused.

## 2020-06-26 NOTE — ED Notes (Signed)
VOL/  SEEN  BY  DR  RAO  PENDING   PLACEMENT
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
## 2020-06-27 DIAGNOSIS — T5491XA Toxic effect of unspecified corrosive substance, accidental (unintentional), initial encounter: Secondary | ICD-10-CM

## 2020-06-27 DIAGNOSIS — F319 Bipolar disorder, unspecified: Secondary | ICD-10-CM

## 2020-06-27 HISTORY — DX: Toxic effect of unspecified corrosive substance, accidental (unintentional), initial encounter: T54.91XA

## 2020-06-27 LAB — TSH: TSH: 0.74 u[IU]/mL (ref 0.350–4.500)

## 2020-06-27 MED ORDER — ALPRAZOLAM 0.5 MG PO TABS
1.0000 mg | ORAL_TABLET | Freq: Two times a day (BID) | ORAL | Status: DC
  Administered 2020-06-27 – 2020-06-28 (×3): 1 mg via ORAL
  Filled 2020-06-27 (×3): qty 2

## 2020-06-27 NOTE — Progress Notes (Signed)
Recreation Therapy Notes  INPATIENT RECREATION THERAPY ASSESSMENT  Patient Details Name: Jennifer Bright MRN: [id number] DOB: 1966/09/24 Today's Date: 06/27/2020       Information Obtained From: Patient  Able to Participate in Assessment/Interview: Yes  Patient Presentation: Responsive  Reason for Admission (Per Patient): Active Symptoms  Patient Stressors:    Coping Skills:   Music, Prayer, TV  Leisure Interests (2+):  Individual - TV (Stain glass)  Frequency of Recreation/Participation: Monthly  Awareness of Community Resources:  Yes  Community Resources:  [employer]  Current Use:    If no, Barriers?:    Expressed Interest in [employer] Information:    [place] of Residence:  Film/video editor  Patient Main Form of Transportation: Other (Comment) (Husband)  Patient Strengths:  N/A  Patient Identified Areas of Improvement:  N/A  Patient Goal for Hospitalization:  Help myself learn to sleep and clear my head  Current SI (including self-harm):  No  Current HI:  No  Current AVH: No  Staff Intervention Plan: Group Attendance, Collaborate with Interdisciplinary Treatment Team  Consent to Intern Participation: N/A  Hezakiah Champeau 06/27/2020, 1:28 PM
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
## 2020-06-27 NOTE — Progress Notes (Signed)
Recreation Therapy Notes  INPATIENT RECREATION TR PLAN  Patient Details Name: Jennifer Bright MRN: [id number] DOB: 06-18-1966 Today's Date: 06/27/2020  Rec Therapy Plan Is patient appropriate for Therapeutic Recreation?: Yes Treatment times per week: at least 3 Estimated Length of Stay: 5-7 days TR Treatment/Interventions: Group participation (Comment)  Discharge Criteria    Discharge Summary     Jennifer Bright 06/27/2020, 1:29 PM
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
## 2020-06-27 NOTE — Plan of Care (Signed)
D: Pt alert and oriented. Pt denies experiencing any anxiety/depression at this time. Pt reports she experiences chronic pain in her joints however denies wanting any pain management medication. Pt denies experiencing any SI/HI, or AVH at this time. Pt's sentences are very choppy and it's observed that the pt has tremors when taking her medication, mostly noticeable when the cup is a few inches from the mouth.  A: Scheduled medications administered to pt, per MD orders. Support and encouragement provided. Frequent verbal contact made. Routine safety checks conducted q15 minutes.   R: No adverse drug reactions noted. Pt verbally contracts for safety at this time. Pt complaint with medications and treatment plan. Pt interacts well with others on the unit. Pt remains safe at this time. Will continue to monitor.   Problem: Education: Goal: Knowledge of [place] [facility] Education information/materials will improve Outcome: Not Progressing Goal: Emotional status will improve Outcome: Not Progressing Goal: Mental status will improve Outcome: Not Progressing Goal: Verbalization of understanding the information provided will improve Outcome: Not Progressing   Problem: Activity: Goal: Interest or engagement in activities will improve Outcome: Not Progressing Goal: Sleeping patterns will improve Outcome: Not Progressing   Problem: Coping: Goal: Ability to verbalize frustrations and anger appropriately will improve Outcome: Not Progressing Goal: Ability to demonstrate self-control will improve Outcome: Not Progressing   Problem: Health Behavior/Discharge Planning: Goal: Identification of resources available to assist in meeting health care needs will improve Outcome: Not Progressing Goal: Compliance with treatment plan for underlying cause of condition will improve Outcome: Not Progressing   Problem: Physical Regulation: Goal: Ability to maintain clinical measurements within normal limits  will improve Outcome: Not Progressing   Problem: Safety: Goal: Periods of time without injury will increase Outcome: Not Progressing   Problem: Education: Goal: Ability to state activities that reduce stress will improve Outcome: Not Progressing   Problem: Coping: Goal: Ability to identify and develop effective coping behavior will improve Outcome: Not Progressing   Problem: Self-Concept: Goal: Ability to identify factors that promote anxiety will improve Outcome: Not Progressing Goal: Level of anxiety will decrease Outcome: Not Progressing Goal: Ability to modify response to factors that promote anxiety will improve Outcome: Not Progressing   Problem: Medication: Goal: Compliance with prescribed medication regimen will improve Outcome: Not Progressing   Problem: Self-Concept: Goal: Ability to disclose and discuss suicidal ideas will improve Outcome: Not Progressing Goal: Will verbalize positive feelings about self Outcome: Not Progressing

## 2020-06-27 NOTE — Progress Notes (Signed)
Recreation Therapy Notes  Date: 06/27/2020  Time: 9:30 am   Location: Craft room     Behavioral response: N/A   Intervention Topic: Relaxation  Discussion/Intervention: Patient did not attend group.   Clinical Observations/Feedback:  Patient did not attend group.   Iman Orourke LRT/CTRS        Marjoria Mancillas 06/27/2020 12:57 PM

## 2020-06-27 NOTE — Plan of Care (Signed)
Patient denies SI/HI/AVH  Problem: Education: Goal: Emotional status will improve Outcome: Progressing Goal: Mental status will improve Outcome: Progressing   

## 2020-06-27 NOTE — Progress Notes (Signed)
Patient calm and pleasant during assessment. Patient denies SI/HI/AVH and pain. Patient endorses "mild" anxiety and depression. Patient compliant with medication administration per MD orders. Patient given education, support, and encouragement to be active in her treatment plan. Pt observed by this Clinical research associate interacting appropriately with staff and peers on the unit. Patient being monitored Q 15 minutes for safety per unit protocol. Pt remains safe on the unit.

## 2020-06-27 NOTE — H&P (Signed)
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  This is a 54 year old woman with chronic psychiatric and medical problems who was brought to the emergency room after her husband discovered that she had ingested household bleach.  Throughout her evaluation the patient has insisted that she was not trying to cause her self-harm but that she consumed the bleach because she thought it would help her to sleep.  This is what she tells me this evening as well.  She tells me that she has chronic sleep difficulty and has not been able to sleep through the night and weeks.  On the other hand she is frequently tired during the day and continues to have "spells" several times a week during which she will pass out.  Patient says that her life stresses are chronic but cannot specify any of them being any worse than usual.  Reports that her relationship with her children is actually fairly good right now.  She has chronic auditory hallucinations which she cannot describe very well but says are no different than usual.  Patient is on multiple medications.  She reports having no idea of what medicine she is taking because she says her husband manages all of it.  Checking the controlled substance database and comparing it to medicine reconciliation it appears that she takes a total of 4 mg of alprazolam per day 2 mg of short acting and 2 mg of extended release.  She also has fairly recently been started on Adderall and is currently taking a total of 60 mg a day.  She is on multiple  other medicines including antipsychotics antidepressants Topamax for her history of migraines.  Patient denies alcohol use.  Denies misuse of medicine or use of any other controlled substances.  Her drug screen is positive for barbiturates.  The only since I can make of this is to guess that she may be prescribed Fioricets or consuming Fioricets that are not listed on her medicine list because they are not technically controlled.  Patient continues to deny suicidal ideation.  In the emergency room is documented that her husband also felt that she was not acutely trying to harm her self. Associated Signs/Symptoms: Depression Symptoms:  insomnia, fatigue, difficulty concentrating, impaired memory, anxiety, disturbed sleep, (Hypo) Manic Symptoms:  Impulsivity, Anxiety Symptoms:  Excessive Worry, Psychotic Symptoms:  Hallucinations: Auditory PTSD Symptoms: It is documented that the patient has a history of childhood trauma.  I did not discuss the details with her but past trauma seems to have been at least partially related to her chronic symptoms Total Time spent with patient: 1 hour  Past Psychiatric History: Patient has a longstanding history of mood symptoms with multiple treatments.  She has reported that in the past she had electroconvulsive therapy for up to 2 years regularly.  We do not have documentation of that I am not sure where it was done.  She carries a diagnosis of mixed bipolar disorder or bipolar 2.  She is on 2 antidepressants, Geodon, high-dose alprazolam and recently has started high dose Adderall.  She does have a past history of intentional self-harm with  the most recent episode being some cutting back in December.  Patient has chronic poorly described auditory hallucinations.  Additionally she has chronic migraines although these appear to be complicated and poorly controlled.  She has spells of loss of consciousness that remain unexplained despite having seen her neurologist  several times.  Is the patient at risk to self? No.  Has the patient been a risk to self in the past 6 months? Yes.    Has the patient been a risk to self within the distant past? Yes.    Is the patient a risk to others? No.  Has the patient been a risk to others in the past 6 months? No.  Has the patient been a risk to others within the distant past? No.   Prior Inpatient Therapy:   Prior Outpatient Therapy:    Alcohol Screening: 1. How often do you have a drink containing alcohol?: Never 2. How many drinks containing alcohol do you have on a typical day when you are drinking?: 1 or 2 3. How often do you have six or more drinks on one occasion?: Never AUDIT-C Score: 0 4. How often during the last year have you found that you were not able to stop drinking once you had started?: Never 5. How often during the last year have you failed to do what was normally expected from you because of drinking?: Never 6. How often during the last year have you needed a first drink in the morning to get yourself going after a heavy drinking session?: Never 7. How often during the last year have you had a feeling of guilt of remorse after drinking?: Never 8. How often during the last year have you been unable to remember what happened the night before because you had been drinking?: Never 9. Have you or someone else been injured as a result of your drinking?: No 10. Has a relative or friend or a doctor or another health worker been concerned about your drinking or suggested you cut down?: No Alcohol Use Disorder Identification Test Final Score (AUDIT): 0 Alcohol Brief Interventions/Follow-up: AUDIT Score <7 follow-up not indicated Substance Abuse History in the last 12 months:  No. Consequences of Substance Abuse: Negative Previous Psychotropic Medications: Yes  Psychological Evaluations: Yes  Past Medical History:  Past Medical History:  Diagnosis Date  . Acid reflux   . Anemia   . Anxiety   .  Bipolar disorder (HCC)   . Cataract   . Chronic kidney disease    STAGE 3  . Depression   . Diabetes mellitus    NIDD  . Essential tremor   . Family history of breast cancer    doesn't meet medicare guidelines for cancer genetic testing.   . Family history of ovarian cancer    Pt doesn't meet Medicare genetic testing guidelines  . Hyperlipidemia   . Hypothyroid   . IBS (irritable bowel syndrome)   . Migraine headache    vestibular migraine  . Obesity   . Osteopenia   . Restless leg syndrome   . Sarcoidosis   . Sleep apnea   . Syncope \    Past Surgical History:  Procedure Laterality Date  . ABDOMINAL HYSTERECTOMY    . APPENDECTOMY    . BLADDER SURGERY     bladder tact  . CATARACT EXTRACTION W/PHACO Right 09/21/2018   Procedure: CATARACT EXTRACTION PHACO AND INTRAOCULAR LENS PLACEMENT (IOC);  Surgeon: Nevada Crane, MD;  Location: [facility] ORS;  Service: Ophthalmology;  Laterality: Right;  US  00:20 CDE 00.82 Fluid pack lot # [id number]  . CATARACT EXTRACTION W/PHACO Left 10/17/2018   Procedure: CATARACT EXTRACTION PHACO AND INTRAOCULAR LENS PLACEMENT (IOC)  LEFT;  Surgeon: Nevada CraneKing, Bradley Mark, MD;  [facility];  Service: Ophthalmology;  Laterality: Left;  diabetic - diet controlled  . CESAREAN SECTION    . CHOLECYSTECTOMY    . COLONOSCOPY WITH PROPOFOL N/A 04/05/2017   Procedure: COLONOSCOPY WITH PROPOFOL;  Surgeon: Christena DeemSkulskie, Martin U, MD;  [facility];  Service: Endoscopy;  Laterality: N/A;  . ESOPHAGOGASTRODUODENOSCOPY (EGD) WITH PROPOFOL N/A 08/28/2016   Procedure: ESOPHAGOGASTRODUODENOSCOPY (EGD) WITH PROPOFOL;  Surgeon: [name], MD;  [facility];  Service: Endoscopy;  Laterality: N/A;  . HERNIA REPAIR    . INGUINAL HERNIA REPAIR     rt and left  . lexiscan cardiolite    . TILT TABLE STUDY N/A 04/01/2012   Procedure: TILT TABLE STUDY;  Surgeon: [name], MD;  [facility];  Service: Cardiovascular;   Laterality: N/A;   Family History:  Family History  Problem Relation Age of Onset  . Colon cancer Father   . Stomach cancer Father   . Hypertension Mother   . Hyperlipidemia Mother   . Stroke Mother   . Alcohol abuse Brother   . Irritable bowel syndrome Sister   . Breast cancer Paternal Aunt   . Ovarian cancer Paternal Aunt   . Cancer Cousin   . Cancer Cousin   . Ovarian cancer Cousin   . Kidney cancer Neg Hx   . Kidney disease Neg Hx   . Prostate cancer Neg Hx    Family Psychiatric  History: Patient reports at least 2 fairly close relatives a grandmother and an aunt "heard things" but does not know of any family history of suicide Tobacco Screening: Have you used any form of tobacco in the last 30 days? (Cigarettes, Smokeless Tobacco, Cigars, and/or Pipes): No Social History:  Social History   Substance and Sexual Activity  Alcohol Use No     Social History   Substance and Sexual Activity  Drug Use No    Additional Social History:                           Allergies:   Allergies  Allergen Reactions  . Cleocin [Clindamycin Hcl] Other (See Comments)    GI distress   Lab Results:  Results for orders placed or performed during the hospital encounter of 06/25/20 (from the past 48 hour(s))  Comprehensive metabolic panel     Status: Abnormal   Collection Time: 06/25/20  5:16 PM  Result Value Ref Range   Sodium 145 135 - 145 mmol/L   Potassium 3.5 3.5 - 5.1 mmol/L   Chloride 111 98 - 111 mmol/L   CO2 24 22 - 32 mmol/L   Glucose, Bld 119 (H) 70 - 99 mg/dL    Comment: Glucose reference range applies only to samples taken after fasting for at least 8 hours.   BUN 26 (H) 6 - 20 mg/dL   Creatinine, Ser 4.541.12 (H) 0.44 - 1.00 mg/dL   Calcium 09.810.3 8.9 - 11.910.3 mg/dL   Total Protein 7.6 6.5 - 8.1 g/dL   Albumin 4.6 3.5 - 5.0 g/dL   AST 21 15 - 41 U/L   ALT 30 0 - 44 U/L   Alkaline Phosphatase 61 38 - 126 U/L   Total Bilirubin 0.7  0.3 - 1.2 mg/dL   GFR calc non  Af Amer 56 (L) >60 mL/min   GFR calc Af Amer >60 >60 mL/min   Anion gap 10 5 - 15    Comment: Performed at [facility], [address] [place], [place] [address]  Ethanol     Status: None   Collection Time: 06/25/20  5:16 PM  Result Value Ref Range   Alcohol, Ethyl (B) <10 <10 mg/dL    Comment: (NOTE) Lowest detectable limit for serum alcohol is 10 mg/dL.  For medical purposes only. Performed at [facility], [address] [place], [place] [address]   Salicylate level     Status: Abnormal   Collection Time: 06/25/20  5:16 PM  Result Value Ref Range   Salicylate Lvl <7.0 (L) 7.0 - 30.0 mg/dL    Comment: Performed at [facility], [address] [place], [place] [address]  Acetaminophen level     Status: Abnormal   Collection Time: 06/25/20  5:16 PM  Result Value Ref Range   Acetaminophen (Tylenol), Serum <10 (L) 10 - 30 ug/mL    Comment: (NOTE) Therapeutic concentrations vary significantly. A range of 10-30 ug/mL  may be an effective concentration for many patients. However, some  are best treated at concentrations outside of this range. Acetaminophen concentrations >150 ug/mL at 4 hours after ingestion  and >50 ug/mL at 12 hours after ingestion are often associated with  toxic reactions.  Performed at [facility], [address] [place], [place] [address]   cbc     Status: None   Collection Time: 06/25/20  5:16 PM  Result Value Ref Range   WBC 6.4 4.0 - 10.5 K/uL   RBC 4.76 3.87 - 5.11 MIL/uL   Hemoglobin 14.4 12.0 - 15.0 g/dL   HCT 38.4 36 - 46 %   MCV 88.0 80.0 - 100.0 fL   MCH 30.3 26.0 - 34.0 pg   MCHC 34.4 30.0 - 36.0 g/dL   RDW 66.5 99.3 - 57.0 %   Platelets 213 150 - 400 K/uL   nRBC 0.0 0.0 - 0.2 %    Comment: Performed at [facility], [address] [place], [place] [address]  Urine Drug Screen, Qualitative     Status: Abnormal   Collection Time: 06/25/20  5:16 PM  Result Value Ref Range    Tricyclic, Ur Screen NONE DETECTED NONE DETECTED   Amphetamines, Ur Screen POSITIVE (A) NONE DETECTED   MDMA (Ecstasy)Ur Screen NONE DETECTED NONE DETECTED   Cocaine Metabolite,Ur [place] NONE DETECTED NONE DETECTED   Opiate, Ur Screen NONE DETECTED NONE DETECTED   Phencyclidine (PCP) Ur S NONE DETECTED NONE DETECTED   Cannabinoid 50 Ng, Ur [place] NONE DETECTED NONE DETECTED   Barbiturates, Ur Screen POSITIVE (A) NONE DETECTED   Benzodiazepine, Ur Scrn POSITIVE (A) NONE DETECTED   Methadone Scn, Ur NONE DETECTED NONE DETECTED    Comment: (NOTE) Tricyclics + metabolites, urine    Cutoff 1000 ng/mL Amphetamines + metabolites, urine  Cutoff 1000 ng/mL MDMA (Ecstasy), urine              Cutoff 500 ng/mL Cocaine Metabolite, urine          Cutoff 300 ng/mL Opiate + metabolites, urine        Cutoff 300 ng/mL Phencyclidine (PCP), urine         Cutoff 25 ng/mL Cannabinoid, urine  Cutoff 50 ng/mL Barbiturates + metabolites, urine  Cutoff 200 ng/mL Benzodiazepine, urine              Cutoff 200 ng/mL Methadone, urine                   Cutoff 300 ng/mL  The urine drug screen provides only a preliminary, unconfirmed analytical test result and should not be used for non-medical purposes. Clinical consideration and professional judgment should be applied to any positive drug screen result due to possible interfering substances. A more specific alternate chemical method must be used in order to obtain a confirmed analytical result. Gas chromatography / mass spectrometry (GC/MS) is the preferred confirm atory method. Performed at [facility], [address] [place], [place] [address]   TSH     Status: None   Collection Time: 06/25/20  5:16 PM  Result Value Ref Range   TSH 1.737 0.350 - 4.500 uIU/mL    Comment: Performed by a 3rd Generation assay with a functional sensitivity of <=0.01 uIU/mL. Performed at [facility], [address] [place], [place] [address]    Acetaminophen level     Status: Abnormal   Collection Time: 06/25/20  9:12 PM  Result Value Ref Range   Acetaminophen (Tylenol), Serum <10 (L) 10 - 30 ug/mL    Comment: (NOTE) Therapeutic concentrations vary significantly. A range of 10-30 ug/mL  may be an effective concentration for many patients. However, some  are best treated at concentrations outside of this range. Acetaminophen concentrations >150 ug/mL at 4 hours after ingestion  and >50 ug/mL at 12 hours after ingestion are often associated with  toxic reactions.  Performed at [facility], [address] [place], [place] [address]   Pregnancy, urine POC     Status: None   Collection Time: 06/26/20  7:24 AM  Result Value Ref Range   Preg Test, Ur NEGATIVE NEGATIVE    Comment:        THE SENSITIVITY OF THIS METHODOLOGY IS >24 mIU/mL   SARS Coronavirus 2 by RT PCR (hospital order, performed in [facility] hospital lab) Nasopharyngeal Nasopharyngeal Swab     Status: None   Collection Time: 06/26/20  6:36 PM   Specimen: Nasopharyngeal Swab  Result Value Ref Range   SARS Coronavirus 2 NEGATIVE NEGATIVE    Comment: (NOTE) SARS-CoV-2 target nucleic acids are NOT DETECTED.  The SARS-CoV-2 RNA is generally detectable in upper and lower respiratory specimens during the acute phase of infection. The lowest concentration of SARS-CoV-2 viral copies this assay can detect is 250 copies / mL. A negative result does not preclude SARS-CoV-2 infection and should not be used as the sole basis for treatment or other patient management decisions.  A negative result may occur with improper specimen collection / handling, submission of specimen other than nasopharyngeal swab, presence of viral mutation(s) within the areas targeted by this assay, and inadequate number of viral copies (<250 copies / mL). A negative result must be combined with clinical observations, patient history, and epidemiological information.  Fact Sheet  for Patients:   BoilerBrush.com.cy  Fact Sheet for Healthcare Providers: https://pope.com/  This test is not yet approved or  cleared by the [place] FDA and has been authorized for detection and/or diagnosis of SARS-CoV-2 by FDA under an Emergency Use Authorization (EUA).  This EUA will remain in effect (meaning this test can be used) for the duration of the COVID-19 declaration under Section 564(b)(1) of the Act,  21 U.S.C. section 360bbb-3(b)(1), unless the authorization is terminated or revoked sooner.  Performed at [facility], [address] [place], [place] [address]     Blood Alcohol level:  Lab Results  Component Value Date   [facility] <10 06/25/2020   ETH <10 09/18/2019    Metabolic Disorder Labs:  Lab Results  Component Value Date   HGBA1C 5.7 (A) 12/21/2019   MPG 103 07/24/2016   No results found for: PROLACTIN Lab Results  Component Value Date   CHOL 197 12/13/2019   TRIG 207 (H) 12/13/2019   HDL 50 12/13/2019   VLDL 54 (H) 05/17/2014   LDLCALC 111 (H) 12/13/2019   LDLCALC 74 05/17/2014    Current Medications: Current Facility-Administered Medications  Medication Dose Route Frequency Provider Last Rate Last Admin  . acetaminophen (TYLENOL) tablet 650 mg  650 mg Oral Q6H PRN Roselind Messier, MD      . ALPRAZolam (XANAX XR) 24 hr tablet 1 mg  1 mg Oral BID Jasun Gasparini T, MD      . alum & mag hydroxide-simeth (MAALOX/MYLANTA) 200-200-20 MG/5ML suspension 30 mL  30 mL Oral Q4H PRN Roselind Messier, MD      . atorvastatin (LIPITOR) tablet 40 mg  40 mg Oral QHS Gillermo Murdoch, NP   40 mg at 06/26/20 2127  . benztropine (COGENTIN) tablet 1 mg  1 mg Oral BID Gillermo Murdoch, NP   1 mg at 06/26/20 2127  . buPROPion (WELLBUTRIN XL) 24 hr tablet 300 mg  300 mg Oral Daily Gillermo Murdoch, NP      . docusate sodium (COLACE) capsule 100 mg  100 mg Oral Daily Gillermo Murdoch, NP      .  escitalopram (LEXAPRO) tablet 20 mg  20 mg Oral Daily Gillermo Murdoch, NP      . levothyroxine (SYNTHROID) tablet 100 mcg  100 mcg Oral Q0600 Gillermo Murdoch, NP      . linaclotide Karlene Einstein) capsule 145 mcg  145 mcg Oral QAC breakfast Gillermo Murdoch, NP      . magnesium hydroxide (MILK OF MAGNESIA) suspension 30 mL  30 mL Oral Daily PRN Roselind Messier, MD      . melatonin tablet 10 mg  10 mg Oral QHS Gillermo Murdoch, NP   10 mg at 06/26/20 2125  . omega-3 acid ethyl esters (LOVAZA) capsule 1 g  1 g Oral Daily Gillermo Murdoch, NP      . pantoprazole (PROTONIX) EC tablet 40 mg  40 mg Oral Daily Gillermo Murdoch, NP      . topiramate (TOPAMAX) tablet 200 mg  200 mg Oral BID Gillermo Murdoch, NP   200 mg at 06/26/20 2128  . traZODone (DESYREL) tablet 50 mg  50 mg Oral QHS PRN Gillermo Murdoch, NP   50 mg at 06/26/20 2125  . vitamin B-12 (CYANOCOBALAMIN) tablet 2,500 mcg  2,500 mcg Oral Daily Gillermo Murdoch, NP      . ziprasidone (GEODON) capsule 60 mg  60 mg Oral QPC supper Gillermo Murdoch, NP       PTA Medications: Medications Prior to Admission  Medication Sig Dispense Refill Last Dose  . ALPRAZolam (XANAX XR) 1 MG 24 hr tablet Take 1 mg by mouth 2 (two) times daily as needed for anxiety or sleep.     Marland Kitchen ALPRAZolam (XANAX) 1 MG tablet Take 1 mg by mouth 2 (two) times daily as needed for anxiety or sleep.      Marland Kitchen amphetamine-dextroamphetamine (ADDERALL XR) 30 MG 24 hr capsule  Take 30 mg by mouth 2 (two) times daily.     Marland Kitchen atorvastatin (LIPITOR) 40 MG tablet Take 40 mg by mouth at bedtime.      . benztropine (COGENTIN) 1 MG tablet Take 1 mg by mouth 2 (two) times daily.      Marland Kitchen buPROPion (WELLBUTRIN XL) 300 MG 24 hr tablet Take 300 mg by mouth daily.     . Cholecalciferol (VITAMIN D3) 50 MCG (2000 UT) capsule Take 2,000 Units by mouth daily.     . Cyanocobalamin (B-12) 2500 MCG TABS Take 2,500 mcg by mouth daily.     [name] 100 MG  CAPS Take 100 mg by mouth daily.      Marland Kitchen escitalopram (LEXAPRO) 20 MG tablet Take 20 mg by mouth daily.     . furosemide (LASIX) 20 MG tablet Take 20 mg by mouth daily.     Marland Kitchen levothyroxine (SYNTHROID) 100 MCG tablet TAKE 1 TABLET EVERY DAY ON EMPTY STOMACHWITH A GLASS OF WATER AT LEAST 30-60 MINBEFORE BREAKFAST (Patient taking differently: Take 100 mcg by mouth daily before breakfast. ) 30 tablet 3   . linaclotide (LINZESS) 145 MCG CAPS capsule Take 1 capsule (145 mcg total) by mouth daily before breakfast. 30 capsule 3   . Melatonin 10 MG TABS Take 10 mg by mouth at bedtime.     . Multiple Vitamin (MULTIVITAMIN) capsule Take by mouth.     . Omega-3 300 MG CAPS Take 1.2 g by mouth daily.      . pantoprazole (PROTONIX) 40 MG tablet TAKE ONE TABLET EVERY DAY 15-20 MINUTES BEFORE A MEAL 30 tablet 3   . topiramate (TOPAMAX) 200 MG tablet TAKE 1 TABLET BY MOUTH IN  THE MORNING AND 1 TABLET BY MOUTH IN THE EVENING 180 tablet 3   . ziprasidone (GEODON) 60 MG capsule Take 180 mg by mouth every evening. After dinner       Musculoskeletal: Strength & Muscle Tone: decreased Gait & Station: unsteady Patient leans: N/A  Psychiatric Specialty Exam: Physical Exam Vitals and nursing note reviewed.  Constitutional:      Appearance: She is well-developed.  HENT:     Head: Normocephalic and atraumatic.  Eyes:     Conjunctiva/sclera: Conjunctivae normal.     Pupils: Pupils are equal, round, and reactive to light.  Cardiovascular:     Heart sounds: Normal heart sounds.  Pulmonary:     Effort: Pulmonary effort is normal.  Abdominal:     Palpations: Abdomen is soft.  Musculoskeletal:        General: Normal range of motion.     Cervical back: Normal range of motion.  Skin:    General: Skin is warm and dry.  Neurological:     General: No focal deficit present.     Mental Status: She is alert.  Psychiatric:        Attention and Perception: Attention normal. She perceives auditory hallucinations.         Mood and Affect: Mood is anxious. Affect is blunt.        Speech: Speech is delayed.        Behavior: Behavior is slowed. Behavior is not agitated, aggressive or hyperactive.        Thought Content: Thought content is not paranoid or delusional. Thought content does not include homicidal or suicidal ideation.        Cognition and Memory: Cognition is impaired. Memory is impaired.        Judgment:  Judgment is impulsive.     Review of Systems  Constitutional: Negative.   HENT: Negative.   Eyes: Negative.   Respiratory: Negative.   Cardiovascular: Negative.   Gastrointestinal: Negative.   Musculoskeletal: Negative.   Skin: Negative.   Neurological: Negative.   Psychiatric/Behavioral: Positive for sleep disturbance. Negative for suicidal ideas. The patient is nervous/anxious.     Blood pressure 109/88, pulse 80, temperature 98.6 F (37 C), temperature source Oral, resp. rate 16, height  (1.626 m), weight 69.9 kg, SpO2 100 %.Body mass index is 26.43 kg/m.  General Appearance: Casual  Eye Contact:  Minimal  Speech:  Slow  Volume:  Decreased  Mood:  Euthymic  Affect:  Constricted  Thought Process:  Coherent  Orientation:  Full (Time, Place, and Person)  Thought Content:  Illogical and Hallucinations: Auditory  Suicidal Thoughts:  No  Homicidal Thoughts:  No  Memory:  Immediate;   Fair Recent;   Poor Remote;   Poor  Judgement:  Impaired  Insight:  Shallow  Psychomotor Activity:  Decreased  Concentration:  Concentration: Poor  Recall:  Poor  Fund of Knowledge:  Fair  Language:  Fair  Akathisia:  No  Handed:  Right  AIMS (if indicated):     Assets:  Desire for Improvement Financial Resources/Insurance Housing Social Support  ADL's:  Impaired  Cognition:  Impaired,  Mild  Sleep:       Treatment Plan Summary: Daily contact with patient to assess and evaluate symptoms and progress in treatment, Medication management and Plan This is a 54 year old woman with  multiple longstanding chronic and complicated medical and psychiatric conditions.  She ingested some household bleach.  Patient has insisted throughout her evaluation that she was not trying to harm her self but thought that it would help her to get some sleep.  She tells me that she watched the television show "jackass" and saw people consuming bleach and thought that it made him sedated.  Patient's affect currently is euthymic.  She is cooperative with treatment.  Documentation from the emergency room suggest that her husband also believe that she was not trying to harm her self.  On the other hand the patient has chronic mood disorder and does have a past history of intentional self-harm.  Admitted to the hospital for monitoring and stabilization.  Patient is on high doses of a couple of controlled substances and may also be using Fioricets.  I would suggest that it is no wonder that her sleep is chaotic given the amount of Xanax and now Adderall that she is taking.  For now we will try to continue her usual medicine but limit the alprazolam to 2 mg a day rather than the full 4 mg.  She should not have any symptomatic withdrawal from that.  No need to continue Adderall at this point.  Reevaluate today and have treatment team see her.  Continue 15-minute checks.  If she is continuing to deny suicidal ideation and her husband feels comfortable with discharge we are likely to discharge within the next day I would guess.  Patient understands and is agreeable.  Observation Level/Precautions:  15 minute checks  Laboratory:  UDS  Psychotherapy:    Medications:    Consultations:    Discharge Concerns:    Estimated LOS:  Other:     Physician Treatment Plan for Primary Diagnosis: Bipolar depression (HCC) Long Term Goal(s): Improvement in symptoms so as ready for discharge  Short Term Goals: Ability to disclose and discuss suicidal  ideas and Ability to demonstrate self-control will improve  Physician  Treatment Plan for Secondary Diagnosis: Principal Problem:   Bipolar depression (HCC) Active Problems:   Migraine   Anxiety   Essential (primary) hypertension   Hypothyroidism   Bleach ingestion  Long Term Goal(s): Improvement in symptoms so as ready for discharge  Short Term Goals: Ability to identify and develop effective coping behaviors will improve, Ability to maintain clinical measurements within normal limits will improve and Compliance with prescribed medications will improve  I certify that inpatient services furnished can reasonably be expected to improve the patient's condition.    Mordecai Rasmussen, MD 9/9/20212:51 AM

## 2020-06-28 NOTE — Progress Notes (Signed)
D: Pt alert and oriented. Pt denies experiencing any, SI/HI, or AVH at this time. Pt reports she will be able to keep herself safe when she returns home.   A: Pt received discharge and medication education/information. Pt had no belongings locked up, so no belongings were returned.   R: Pt verbalized understanding of discharge and medication education/information.  Pt escorted to medical mall front lobby where her husband picked her up from.

## 2020-06-28 NOTE — [facility] Suicide Risk Assessment (Signed)
[facility] Discharge Suicide Risk Assessment   Principal Problem: Bipolar depression [facility]) Discharge Diagnoses: Principal Problem:   Bipolar depression (HCC) Active Problems:   Migraine   Anxiety   Essential (primary) hypertension   Hypothyroidism   Bleach ingestion   Total Time spent with patient: 30 minutes  Musculoskeletal: Strength & Muscle Tone: decreased Gait & Station: unsteady Patient leans: N/A  Psychiatric Specialty Exam: Review of Systems  Constitutional: Negative.   HENT: Negative.   Eyes: Negative.   Respiratory: Negative.   Cardiovascular: Negative.   Gastrointestinal: Negative.   Musculoskeletal: Positive for arthralgias, back pain and gait problem.  Skin: Negative.   Psychiatric/Behavioral: Negative.     Blood pressure 98/60, pulse 77, temperature 98.2 F (36.8 C), temperature source Oral, resp. rate 17, height 5\' 4"  (1.626 m), weight 69.9 kg, SpO2 100 %.Body mass index is 26.43 kg/m.  General Appearance: Casual  Eye Contact::  Fair  Speech:  Slow409  Volume:  Decreased  Mood:  Euthymic  Affect:  Constricted  Thought Process:  Coherent  Orientation:  Full (Time, Place, and Person)  Thought Content:  Logical  Suicidal Thoughts:  No  Homicidal Thoughts:  No  Memory:  Immediate;   Fair Recent;   Fair Remote;   Fair  Judgement:  Fair  Insight:  Fair  Psychomotor Activity:  Decreased  Concentration:  Fair  Recall:  [phone] of Knowledge:Fair  Language: Fair  Akathisia:  No  Handed:  Right  AIMS (if indicated):     Assets:  Desire for Improvement Financial Resources/Insurance Housing Resilience Social Support  Sleep:  Number of Hours: 8.25  Cognition: WNL  ADL's:  Intact   Mental Status Per Nursing Assessment::   On Admission:  NA  Demographic Factors:  Caucasian  Loss Factors: Decline in physical health  Historical Factors: Impulsivity  Risk Reduction Factors:   Living with another person, especially a relative, Positive social  support and Positive therapeutic relationship  Continued Clinical Symptoms:  Bipolar Disorder:   Mixed State Depression:   Impulsivity  Cognitive Features That Contribute To Risk:  None    Suicide Risk:  Minimal: No identifiable suicidal ideation.  Patients presenting with no risk factors but with morbid ruminations; may be classified as minimal risk based on the severity of the depressive symptoms   Follow-up Information    [phone], MD .   Specialty: Psychiatry Contact information: [address] [place] [phone]               Plan Of Care/Follow-up recommendations:  Activity:  Activity as tolerated Diet:  Diet as usual Other:  Follow-up with your outpatient psychiatrist.  [phone], MD 06/28/2020, 10:09 AM

## 2020-06-28 NOTE — Progress Notes (Addendum)
  [facility] Adult Case Management Discharge Plan :  Will you be returning to the same living situation after discharge:  Yes,  pt reports that she is returning home.  At discharge, do you have transportation home?: Yes,  pt reports that her husband will provide transportation.  Do you have the ability to pay for your medications: Yes,  [facility] Medicare  Release of information consent forms completed and in the chart;  Patient's signature needed at discharge.  Patient to Follow up at:  Follow-up Information    Milagros Evener, MD Follow up.   Specialty: Psychiatry Why: Appointment is scheduled for 07/04/2020 at 1:45PM.  Appointment is VIRTUAL, please call them once dishcarged so they can discuss logistics with you.  Thanks! Contact information: [address] [place] [address] [phone]       Follow-up Information    Milagros Evener, MD Follow up.   Specialty: Psychiatry Why: Appointment is scheduled for 07/04/2020 at 1:45PM.  Appointment is VIRTUAL, please call them once dishcarged so they can discuss logistics with you.  Thanks! Contact information: [address] [place] [address] [phone]        [facility] Follow up.   Why: Your appointment is shceduled for 07/04/2020 at 5:15pm.  Thanks! Contact information: [address] [place], [place] [address] Phone: [phone] Fax: [phone]                      Next level of care provider has access to [facility] Link:no  Safety Planning and Suicide Prevention discussed: Yes,  SPE completed with patient and patient's husband.   Have you used any form of tobacco in the last 30 days? (Cigarettes, Smokeless Tobacco, Cigars, and/or Pipes): No  Has patient been referred to the Quitline?: Patient refused referral  Patient has been referred for addiction treatment: Pt. refused referral  Harden Mo, LCSW 06/28/2020, 10:35 AM

## 2020-06-28 NOTE — [facility] Suicide Risk Assessment (Signed)
[facility] INPATIENT:  Family/Significant Other Suicide Prevention Education  Suicide Prevention Education:  Education Completed; Sheletha Bow, husband,  [phone] has been identified by the patient as the family member/significant other with whom the patient will be residing, and identified as the person(s) who will aid the patient in the event of a mental health crisis (suicidal ideations/suicide attempt).  With written consent from the patient, the family member/significant other has been provided the following suicide prevention education, prior to the and/or following the discharge of the patient.  The suicide prevention education provided includes the following:  Suicide risk factors  Suicide prevention and interventions  National Suicide Hotline telephone number  [facility] assessment telephone number  [facility] Emergency Assistance 911  [facility] and/or Residential Mobile Crisis Unit telephone number  Request made of family/significant other to:  Remove weapons (e.g., guns, rifles, knives), all items previously/currently identified as safety concern.    Remove drugs/medications (over-the-counter, prescriptions, illicit drugs), all items previously/currently identified as a safety concern.  The family member/significant other verbalizes understanding of the suicide prevention education information provided.  The family member/significant other agrees to remove the items of safety concern listed above.  Husband reports "she just had a real bad breakdown and I thought it would be best for her". He reports that he doesn't think the patient drinking bleach "wasn't an attempt 'cause if she was she would have chugged her glass".  He reports that that patient is not a danger to herself or others.  Harden Mo 06/28/2020, 10:29 AM

## 2020-06-28 NOTE — Discharge Summary (Signed)
Physician Discharge Summary Note  Patient:  Jennifer Bright is an 54 y.o., female MRN:  [id number] DOB:  Apr 05, 1966 Patient phone:  [phone] (home)  Patient address:   [address] [place] [address],  Total Time spent with patient: 30 minutes  Date of Admission:  06/26/2020 Date of Discharge: 06/28/2020  Reason for Admission: Patient was admitted through the emergency room where she presented having consumed household bleach.  Patient has consistently stated that she was only trying to "get some sleep" and has denied suicidal ideation but her behavior was strange and concerning enough it was thought that she needed at least brief psychiatric hospitalization  Principal Problem: Bipolar depression [facility]) Discharge Diagnoses: Principal Problem:   Bipolar depression (HCC) Active Problems:   Migraine   Anxiety   Essential (primary) hypertension   Hypothyroidism   Bleach ingestion   Past Psychiatric History: Patient has a longstanding history of severe symptoms including multiple mood and anxiety symptoms complicated by her multiple problems with physical disability  Past Medical History:  Past Medical History:  Diagnosis Date  . Acid reflux   . Anemia   . Anxiety   . Bipolar disorder (HCC)   . Cataract   . Chronic kidney disease    STAGE 3  . Depression   . Diabetes mellitus    NIDD  . Essential tremor   . Family history of breast cancer    doesn't meet medicare guidelines for cancer genetic testing.   . Family history of ovarian cancer    Pt doesn't meet Medicare genetic testing guidelines  . Hyperlipidemia   . Hypothyroid   . IBS (irritable bowel syndrome)   . Migraine headache    vestibular migraine  . Obesity   . Osteopenia   . Restless leg syndrome   . Sarcoidosis   . Sleep apnea   . Syncope \    Past Surgical History:  Procedure Laterality Date  . ABDOMINAL HYSTERECTOMY    . APPENDECTOMY    . BLADDER SURGERY     bladder tact  . CATARACT EXTRACTION  W/PHACO Right 09/21/2018   Procedure: CATARACT EXTRACTION PHACO AND INTRAOCULAR LENS PLACEMENT (IOC);  Surgeon: Nevada Crane, MD;  Location: [facility] ORS;  Service: Ophthalmology;  Laterality: Right;  [place]  00:20 CDE 00.82 Fluid pack lot # [id number]  . CATARACT EXTRACTION W/PHACO Left 10/17/2018   Procedure: CATARACT EXTRACTION PHACO AND INTRAOCULAR LENS PLACEMENT (IOC)  LEFT;  Surgeon: Nevada Crane, MD;  [facility];  Service: Ophthalmology;  Laterality: Left;  diabetic - diet controlled  . CESAREAN SECTION    . CHOLECYSTECTOMY    . COLONOSCOPY WITH PROPOFOL N/A 04/05/2017   Procedure: COLONOSCOPY WITH PROPOFOL;  Surgeon: Christena Deem, MD;  Location: [facility] ENDOSCOPY;  Service: Endoscopy;  Laterality: N/A;  . ESOPHAGOGASTRODUODENOSCOPY (EGD) WITH PROPOFOL N/A 08/28/2016   Procedure: ESOPHAGOGASTRODUODENOSCOPY (EGD) WITH PROPOFOL;  Surgeon: Christena Deem, MD;  Location: [facility] END[facility]COPY;  Service: Endoscopy;  Laterality: N/A;  . HERNIA REPAIR    . INGUINAL HERNIA REPAIR     rt and left  . lexiscan cardiolite    . TILT TABLE STUDY N/A 04/01/2012   Procedure: TILT TABLE STUDY;  Surgeon: Duke Salvia, MD;  Location: [facility] CATH LAB;  Service: Cardiovascular;  Laterality: N/A;   Family History:  Family History  Problem Relation Age of Onset  . Colon cancer Father   . Stomach cancer Father   . Hypertension Mother   . Hyperlipidemia Mother   .  Stroke Mother   . Alcohol abuse Brother   . Irritable bowel syndrome Sister   . Breast cancer Paternal Aunt   . Ovarian cancer Paternal Aunt   . Cancer Cousin   . Cancer Cousin   . Ovarian cancer Cousin   . Kidney cancer Neg Hx   . Kidney disease Neg Hx   . Prostate cancer Neg Hx    Family Psychiatric  History: See previous Social History:  Social History   Substance and Sexual Activity  Alcohol Use No     Social History   Substance and Sexual Activity  Drug Use No    Social History   Socioeconomic History   . Marital status: Married    Spouse name: Not on file  . Number of children: Not on file  . Years of education: Not on file  . Highest education level: Not on file  Occupational History  . Occupation: disabled  Tobacco Use  . Smoking status: Former Smoker    Types: Cigarettes    Quit date: 2018    Years since quitting: 3.6  . Smokeless tobacco: Never Used  Vaping Use  . Vaping Use: Never used  Substance and Sexual Activity  . Alcohol use: No  . Drug use: No  . Sexual activity: Yes  Other Topics Concern  . Not on file  Social History Narrative  . Not on file   Social Determinants of Health   Financial Resource Strain:   . Difficulty of Paying Living Expenses: Not on file  Food Insecurity:   . Worried About Programme researcher, broadcasting/film/video in the Last Year: Not on file  . Ran Out of Food in the Last Year: Not on file  Transportation Needs:   . Lack of Transportation (Medical): Not on file  . Lack of Transportation (Non-Medical): Not on file  Physical Activity:   . Days of Exercise per Week: Not on file  . Minutes of Exercise per Session: Not on file  Stress:   . Feeling of Stress : Not on file  Social Connections:   . Frequency of Communication with Friends and Family: Not on file  . Frequency of Social Gatherings with Friends and Family: Not on file  . Attends Religious Services: Not on file  . Active Member of Clubs or Organizations: Not on file  . Attends Banker Meetings: Not on file  . Marital Status: Not on file    Hospital Course: Patient was kept on 15-minute checks.  Medications were continued as usual from home with the exception of stimulants which were not prescribed during her hospital stay and that we limited alprazolam to 1 mg twice a day rather than the total of 4 mg a day she is normally prescribed.  Patient showed no problem behavior in the hospital.  She was not dangerous threatening or violent and consistently she denied any suicidal ideation.  I  spoke with her husband who feels that it is safe for her to go back home and does not suspect that she was attempting suicide.  Patient has been given psychoeducation about the dangers of consuming household products and the dangers of misusing medication.  At the request of the patient and her husband I informed her usual outpatient psychiatrist of the hospitalization.  I have told the patient I am not changing her prescriptions but suggested to her that if she is not able to sleep she might cut back a bit on the stimulants and talk to her  primary care psychiatrist about that.  Discharge home.  Physical Findings: AIMS: Facial and Oral Movements Muscles of Facial Expression: None, normal Lips and Perioral Area: None, normal Jaw: None, normal Tongue: None, normal,Extremity Movements Upper (arms, wrists, hands, fingers): None, normal Lower (legs, knees, ankles, toes): None, normal, Trunk Movements Neck, shoulders, hips: None, normal, Overall Severity Severity of abnormal movements (highest score from questions above): None, normal Incapacitation due to abnormal movements: None, normal Patient's awareness of abnormal movements (rate only patient's report): No Awareness, Dental Status Current problems with teeth and/or dentures?: No Does patient usually wear dentures?: No  CIWA:    COWS:     Musculoskeletal: Strength & Muscle Tone: within normal limits Gait & Station: normal Patient leans: N/A  Psychiatric Specialty Exam: Physical Exam Vitals and nursing note reviewed.  Constitutional:      Appearance: She is well-developed.  HENT:     Head: Normocephalic and atraumatic.  Eyes:     Conjunctiva/sclera: Conjunctivae normal.     Pupils: Pupils are equal, round, and reactive to light.  Cardiovascular:     Heart sounds: Normal heart sounds.  Pulmonary:     Effort: Pulmonary effort is normal.  Abdominal:     Palpations: Abdomen is soft.  Musculoskeletal:        General: Normal range of  motion.     Cervical back: Normal range of motion.  Skin:    General: Skin is warm and dry.  Neurological:     General: No focal deficit present.     Mental Status: She is alert.  Psychiatric:        Mood and Affect: Mood normal.        Behavior: Behavior normal.     Review of Systems  Constitutional: Negative.   HENT: Negative.   Eyes: Negative.   Respiratory: Negative.   Cardiovascular: Negative.   Gastrointestinal: Negative.   Musculoskeletal: Positive for arthralgias, back pain and gait problem.  Skin: Negative.   Psychiatric/Behavioral: Negative.     Blood pressure 98/60, pulse 77, temperature 98.2 F (36.8 C), temperature source Oral, resp. rate 17, height 5\' 4"  (1.626 m), weight 69.9 kg, SpO2 100 %.Body mass index is 26.43 kg/m.  General Appearance: Casual  Eye Contact:  Good  Speech:  Clear and Coherent  Volume:  Decreased  Mood:  Euthymic  Affect:  Constricted  Thought Process:  Coherent  Orientation:  Full (Time, Place, and Person)  Thought Content:  Logical  Suicidal Thoughts:  No  Homicidal Thoughts:  No  Memory:  Immediate;   Fair Recent;   Fair Remote;   Fair  Judgement:  Fair  Insight:  Fair  Psychomotor Activity:  Normal  Concentration:  Concentration: Fair  Recall:  [employer]Fair  Fund of Knowledge:  Fair  Language:  Fair  Akathisia:  No  Handed:  Right  AIMS (if indicated):     Assets:  Desire for Improvement Housing Resilience Social Support  ADL's:  Intact  Cognition:  WNL  Sleep:  Number of Hours: 8.25     Have you used any form of tobacco in the last 30 days? (Cigarettes, Smokeless Tobacco, Cigars, and/or Pipes): No  Has this patient used any form of tobacco in the last 30 days? (Cigarettes, Smokeless Tobacco, Cigars, and/or Pipes) Yes, No  Blood Alcohol level:  Lab Results  Component Value Date   [facility]   ETH <10 09/18/2019    Metabolic Disorder Labs:  Lab Results  Component Value Date  HGBA1C 5.7 (A) 12/21/2019    MPG 103 07/24/2016   No results found for: PROLACTIN Lab Results  Component Value Date   CHOL 197 12/13/2019   TRIG 207 (H) 12/13/2019   HDL 50 12/13/2019   VLDL 54 (H) 05/17/2014   LDLCALC 111 (H) 12/13/2019   LDLCALC 74 05/17/2014    See Psychiatric Specialty Exam and Suicide Risk Assessment completed by Attending Physician prior to discharge.  Discharge destination:  Home  Is patient on multiple antipsychotic therapies at discharge:  No   Has Patient had three or more failed trials of antipsychotic monotherapy by history:  No  Recommended Plan for Multiple Antipsychotic Therapies: NA  Discharge Instructions    Diet - low sodium heart healthy   Complete by: As directed    Increase activity slowly   Complete by: As directed      Allergies as of 06/28/2020      Reactions   Cleocin [clindamycin Hcl] Other (See Comments)   GI distress      Medication List    TAKE these medications     Indication  ALPRAZolam 1 MG 24 hr tablet Commonly known as: XANAX XR Take 1 mg by mouth 2 (two) times daily as needed for anxiety or sleep.  Indication: Panic Disorder   ALPRAZolam 1 MG tablet Commonly known as: XANAX Take 1 mg by mouth 2 (two) times daily as needed for anxiety or sleep.  Indication: Feeling Anxious   amphetamine-dextroamphetamine 30 MG 24 hr capsule Commonly known as: ADDERALL XR Take 30 mg by mouth 2 (two) times daily.  Indication: Attention Deficit Hyperactivity Disorder   atorvastatin 40 MG tablet Commonly known as: LIPITOR Take 40 mg by mouth at bedtime.  Indication: High Amount of Fats in the Blood   B-12 2500 MCG Tabs Take 2,500 mcg by mouth daily.  Indication: Vitamin B12 deficiency   benztropine 1 MG tablet Commonly known as: COGENTIN Take 1 mg by mouth 2 (two) times daily.  Indication: Extrapyramidal Reaction caused by Medications   buPROPion 300 MG 24 hr tablet Commonly known as: WELLBUTRIN XL Take 300 mg by mouth daily.  Indication: Major  Depressive Disorder   DSS 100 MG Caps Take 100 mg by mouth daily.  Indication: Constipation   escitalopram 20 MG tablet Commonly known as: LEXAPRO Take 20 mg by mouth daily.  Indication: Major Depressive Disorder   furosemide 20 MG tablet Commonly known as: LASIX Take 20 mg by mouth daily.  Indication: Edema   levothyroxine 100 MCG tablet Commonly known as: SYNTHROID TAKE 1 TABLET EVERY DAY ON EMPTY STOMACHWITH A GLASS OF WATER AT LEAST 30-60 MINBEFORE BREAKFAST What changed: See the new instructions.  Indication: Underactive Thyroid   linaclotide 145 MCG Caps capsule Commonly known as: Linzess Take 1 capsule (145 mcg total) by mouth daily before breakfast.  Indication: Chronic Constipation of Unknown Cause   Melatonin 10 MG Tabs Take 10 mg by mouth at bedtime.  Indication: Sleep   multivitamin capsule Take by mouth.  Indication: Vitamin deficiency   Omega-3 300 MG Caps Take 1.2 g by mouth daily.  Indication: Psychological Disorder   pantoprazole 40 MG tablet Commonly known as: PROTONIX TAKE ONE TABLET EVERY DAY 15-20 MINUTES BEFORE A MEAL  Indication: Gastroesophageal Reflux Disease   topiramate 200 MG tablet Commonly known as: TOPAMAX TAKE 1 TABLET BY MOUTH IN  THE MORNING AND 1 TABLET BY MOUTH IN THE EVENING  Indication: Migraine Headache   Vitamin D3 50 MCG (2000 UT)  capsule Take 2,000 Units by mouth daily.  Indication: Osteoporosis   ziprasidone 60 MG capsule Commonly known as: GEODON Take 180 mg by mouth every evening. After dinner  Indication: Major Depressive Disorder       Follow-up Information    Milagros Evener, MD .   Specialty: Psychiatry Contact information: [address] [place] [address] [phone]               Follow-up recommendations:  Activity:  Activity as tolerated Diet:  Regular diet Other:  Continue outpatient treatment as usual.  Comments: Prescriptions are not being supplied at discharge as  she has not been started on any new medicine or had any changes in her medicine and has her usual outpatient medicines already at home.  Signed: Mordecai Rasmussen, MD 06/28/2020, 10:15 AM

## 2020-06-28 NOTE — Progress Notes (Signed)
D: Pt alert and oriented. Pt denies experiencing any anxiety/depression at this time. Pt reports experiencing chronic pain, prn meds given. Pt denies experiencing any SI/HI, or AVH at this time.   A: Scheduled medications administered to pt, per MD orders. Support and encouragement provided. Frequent verbal contact made. Routine safety checks conducted q15 minutes.   R: No adverse drug reactions noted. Pt verbally contracts for safety at this time. Pt complaint with medications and treatment plan. Pt interacts well with others on the unit. Pt remains safe at this time. Will continue to monitor.

## 2020-07-18 DIAGNOSIS — R0789 Other chest pain: Secondary | ICD-10-CM | POA: Diagnosis not present

## 2020-07-18 DIAGNOSIS — D869 Sarcoidosis, unspecified: Secondary | ICD-10-CM | POA: Diagnosis not present

## 2020-08-05 DIAGNOSIS — Z961 Presence of intraocular lens: Secondary | ICD-10-CM | POA: Diagnosis not present

## 2020-08-05 DIAGNOSIS — E119 Type 2 diabetes mellitus without complications: Secondary | ICD-10-CM | POA: Diagnosis not present

## 2020-08-05 DIAGNOSIS — H04123 Dry eye syndrome of bilateral lacrimal glands: Secondary | ICD-10-CM | POA: Diagnosis not present

## 2020-08-05 LAB — HM DIABETES EYE EXAM

## 2020-08-13 ENCOUNTER — Other Ambulatory Visit: Payer: Self-pay | Admitting: Internal Medicine

## 2020-08-14 ENCOUNTER — Encounter: Payer: Self-pay | Admitting: Internal Medicine

## 2020-08-14 ENCOUNTER — Other Ambulatory Visit: Payer: Self-pay

## 2020-08-14 ENCOUNTER — Ambulatory Visit (INDEPENDENT_AMBULATORY_CARE_PROVIDER_SITE_OTHER): Payer: Medicare Other | Admitting: Internal Medicine

## 2020-08-14 VITALS — BP 102/72 | HR 96 | Temp 98.3°F | Ht 64.0 in | Wt 165.0 lb

## 2020-08-14 DIAGNOSIS — E118 Type 2 diabetes mellitus with unspecified complications: Secondary | ICD-10-CM

## 2020-08-14 DIAGNOSIS — I10 Essential (primary) hypertension: Secondary | ICD-10-CM | POA: Diagnosis not present

## 2020-08-14 DIAGNOSIS — N1831 Chronic kidney disease, stage 3a: Secondary | ICD-10-CM

## 2020-08-14 DIAGNOSIS — T5492XD Toxic effect of unspecified corrosive substance, intentional self-harm, subsequent encounter: Secondary | ICD-10-CM

## 2020-08-14 DIAGNOSIS — G250 Essential tremor: Secondary | ICD-10-CM

## 2020-08-14 DIAGNOSIS — Z90722 Acquired absence of ovaries, bilateral: Secondary | ICD-10-CM

## 2020-08-14 DIAGNOSIS — Z90710 Acquired absence of both cervix and uterus: Secondary | ICD-10-CM

## 2020-08-14 DIAGNOSIS — Z9079 Acquired absence of other genital organ(s): Secondary | ICD-10-CM

## 2020-08-14 DIAGNOSIS — F3181 Bipolar II disorder: Secondary | ICD-10-CM

## 2020-08-14 DIAGNOSIS — G43709 Chronic migraine without aura, not intractable, without status migrainosus: Secondary | ICD-10-CM

## 2020-08-14 DIAGNOSIS — F333 Major depressive disorder, recurrent, severe with psychotic symptoms: Secondary | ICD-10-CM

## 2020-08-14 DIAGNOSIS — E039 Hypothyroidism, unspecified: Secondary | ICD-10-CM

## 2020-08-14 DIAGNOSIS — K5904 Chronic idiopathic constipation: Secondary | ICD-10-CM | POA: Diagnosis not present

## 2020-08-14 DIAGNOSIS — Z23 Encounter for immunization: Secondary | ICD-10-CM | POA: Diagnosis not present

## 2020-08-14 DIAGNOSIS — D860 Sarcoidosis of lung: Secondary | ICD-10-CM

## 2020-08-14 DIAGNOSIS — K2100 Gastro-esophageal reflux disease with esophagitis, without bleeding: Secondary | ICD-10-CM | POA: Diagnosis not present

## 2020-08-14 DIAGNOSIS — R569 Unspecified convulsions: Secondary | ICD-10-CM

## 2020-08-14 MED ORDER — LINACLOTIDE 290 MCG PO CAPS: 290.0000 ug | ORAL_CAPSULE | Freq: Every day | ORAL | 1 refills | Status: DC

## 2020-08-14 NOTE — Progress Notes (Signed)
Date:  08/14/2020   Name:  Jennifer Bright   DOB:  April 03, 1966   MRN:  [id number]  Patient is seen today with her husband Harvie Heck.  She is disabled from Bipolar depression. Chief Complaint: Establish Care and Flu Vaccine  Gastroesophageal Reflux She complains of abdominal pain and heartburn. She reports no chest pain, no coughing, no dysphagia, no nausea, no stridor, no water brash or no wheezing. This is a recurrent problem. Associated symptoms include weight loss (due to steroid use). Risk factors include caffeine use (recent bleach ingestion; hx of duodenal ulcers). She has tried a PPI for the symptoms. The treatment provided no relief.  Constipation This is a chronic problem. Her stool frequency is 2 to 3 times per week. Stool description: pasty yellow-green. The patient is on a high fiber diet. She does not exercise regularly. There has been adequate water intake. Associated symptoms include abdominal pain, bloating and weight loss (due to steroid use). Pertinent negatives include no fever, nausea or vomiting. Treatments tried: on linzess 145 mg with only slight improvement.  Thyroid Problem Presents for follow-up visit. Symptoms include anxiety, constipation, tremors and weight loss (due to steroid use). (On the same medication for some time) The symptoms have been stable.  Diabetes She presents for her follow-up diabetic visit. She has type 2 diabetes mellitus. Her disease course has been improving. Hypoglycemia symptoms include dizziness, headaches, nervousness/anxiousness, seizures (possibly?) and tremors. Associated symptoms include weight loss (due to steroid use). Pertinent negatives for diabetes include no chest pain. Symptoms are stable. Current diabetic treatment includes diet. An ACE inhibitor/angiotensin II receptor blocker is being taken.  Hypertension This is a chronic problem. Associated symptoms include headaches. Pertinent negatives include no chest pain or shortness of  breath. Past treatments include ACE inhibitors and diuretics. The current treatment provides significant improvement. Identifiable causes of hypertension include a thyroid problem.   Syncopal episodes - very frequent regardless of position.  These are currently being evaluated by Neurology.  Previous dx were vertebral migraines and narcolepsy.  Now perhaps felt to be seizures - EEG is scheduled.   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  She lost a large amount of weight over the year of treatment, down to 150 lbs.  Followed by Dr. Meredeth Ide and felt to be in remission.  So far, no evidence of extra-pulmonary involvement.  Bipolar disorder - she is followed by Psych.  Medications are being adjusted.  She has auditory hallucinations frequently - now on Geodon.  Also on xanax, adderall, benztropine, Lexapro and trazodone.  Last month intentionally ingested bleach in a suicide attempt.  Now also on Cymbalta to help with atypical chest pains related to anxiety.  Tremor - on Primadone 50 mg AM and 100 mg PM.  Lab Results  Component Value Date   CREATININE 1.12 (H) 06/25/2020   BUN 26 (H) 06/25/2020   NA 145 06/25/2020   K 3.5 06/25/2020   CL 111 06/25/2020   CO2 24 06/25/2020   Lab Results  Component Value Date   CHOL 197 12/13/2019   HDL 50 12/13/2019   LDLCALC 111 (H) 12/13/2019   TRIG 207 (H) 12/13/2019   Lab Results  Component Value Date   TSH 0.740 06/27/2020   Lab Results  Component Value Date   HGBA1C 5.7 (A) 12/21/2019   Lab Results  Component Value Date   WBC 6.4 06/25/2020   HGB 14.4 06/25/2020   HCT 41.9 06/25/2020   MCV 88.0 06/25/2020  PLT 213 06/25/2020   Lab Results  Component Value Date   ALT 30 06/25/2020   AST 21 06/25/2020   ALKPHOS 61 06/25/2020   BILITOT 0.7 06/25/2020     Review of Systems  Constitutional: Positive for weight loss (due to steroid use). Negative for fever.  Respiratory: Positive for chest tightness. Negative for cough,  shortness of breath and wheezing.   Cardiovascular: Positive for leg swelling (intermittent edema). Negative for chest pain.  Gastrointestinal: Positive for abdominal pain, bloating, constipation and heartburn. Negative for dysphagia, nausea and vomiting.  Genitourinary: Positive for frequency and urgency. Negative for hematuria.       S/p hysterectomy  Musculoskeletal: Positive for arthralgias (diffuse joint stiffness without inflammation) and myalgias (large muscles, esp thighs and calfs).  Skin: Positive for color change (frequent bruises). Negative for rash.  Allergic/Immunologic: Negative for environmental allergies.  Neurological: Positive for dizziness, tremors, seizures (possibly?) and headaches.  Hematological: Bruises/bleeds easily.  Psychiatric/Behavioral: Positive for dysphoric mood and sleep disturbance. The patient is nervous/anxious.     Patient Active Problem List   Diagnosis Date Noted  . Bleach ingestion 06/27/2020  . Depression, major, recurrent, severe with psychosis (HCC) 06/26/2020  . Urinary incontinence without sensory awareness 05/21/2020  . Bipolar 1 disorder, manic, moderate (HCC) 09/19/2019  . Bipolar 2 disorder, major depressive episode (HCC) 09/19/2019  . Self-inflicted laceration of left wrist (HCC) 09/19/2019  . Paroxysmal supraventricular tachycardia (HCC) 09/01/2019  . Vaginal atrophy 07/12/2017  . Polypharmacy 08/14/2016  . 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 Hypercalcemia 07/24/2016  . Sarcoidosis 07/24/2016  . Hyperlipidemia, unspecified 11/15/2015  . Type 2 diabetes mellitus without complication, without long-term current use of insulin (HCC) 11/15/2015  . Anxiety disorder 07/29/2015  . Anxiety 05/12/2015  . Bipolar depression (HCC) 05/12/2015  . Essential (primary) hypertension 05/12/2015  . Benign essential tremor 05/12/2015  . Gastro-esophageal reflux disease without esophagitis 05/12/2015  . Cephalalgia 05/12/2015  . Hypersomnia with  sleep apnea 05/12/2015  . Restless leg 05/12/2015  . Diabetes (HCC) 05/12/2015  . Hypothyroidism 05/12/2015  . Migraine variant 05/12/2015  . Bipolar disorder (HCC) 05/12/2015  . Type 2 diabetes mellitus without complications (HCC) 05/12/2015  . Chronic migraine without aura 09/03/2012  . 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 Syncope and collapse 03/17/2012  . Depression 03/17/2012  . Migraine 03/17/2012    Allergies  Allergen Reactions  . Cleocin [Clindamycin Hcl] Other (See Comments)    GI distress    Past Surgical History:  Procedure Laterality Date  . ABDOMINAL HYSTERECTOMY    . APPENDECTOMY    . BLADDER SURGERY     bladder tact  . CATARACT EXTRACTION W/PHACO Right 09/21/2018   Procedure: CATARACT EXTRACTION PHACO AND INTRAOCULAR LENS PLACEMENT (IOC);  Surgeon: Nevada Crane, MD;  Location: [facility] ORS;  Service: Ophthalmology;  Laterality: Right;  [place]  00:20 CDE 00.82 Fluid pack lot # [id number]  . CATARACT EXTRACTION W/PHACO Left 10/17/2018   Procedure: CATARACT EXTRACTION PHACO AND INTRAOCULAR LENS PLACEMENT (IOC)  LEFT;  Surgeon: Nevada Crane, MD;  Location: [facility];  Service: Ophthalmology;  Laterality: Left;  diabetic - diet controlled  . CESAREAN SECTION    . CHOLECYSTECTOMY    . COLONOSCOPY WITH PROPOFOL N/A 04/05/2017   Procedure: COLONOSCOPY WITH PROPOFOL;  Surgeon: Christena Deem, MD;  Location[facility] END[facility];  Service: Endoscopy;  Laterality: N/A;  . ESOPHAGOGASTRODUODENOSCOPY (EGD) WITH PROPOFOL N/A 08/28/2016   Procedure: ESOPHAGOGASTRODUODENOSCOPY (EGD) WITH PROPOFOL;  Surgeon: Christena Deem, MD;  Location: [facility] ENDOSCOPY;  Service: Endoscopy;  Laterality: N/A;  . HERNIA REPAIR    . INGUINAL HERNIA REPAIR     rt and left  . lexiscan cardiolite    . TILT TABLE STUDY N/A 04/01/2012   Procedure: TILT TABLE STUDY;  Surgeon: Duke Salvia, MD;  Location: [facility] CATH LAB;  Service: Cardiovascular;  Laterality: N/A;    Social History     Tobacco Use  . Smoking status: Former Smoker    Types: Cigarettes    Quit date: 2018    Years since quitting: 3.8  . Smokeless tobacco: Never Used  Vaping Use  . Vaping Use: Never used  Substance Use Topics  . Alcohol use: No  . Drug use: No     Medication list has been reviewed and updated.  Current Meds  Medication Sig  . ALPRAZolam (XANAX XR) 1 MG 24 hr tablet Take 1-3 mg by mouth 2 (two) times daily as needed for anxiety or sleep. Dr. Evelene Croon  . amphetamine-dextroamphetamine (ADDERALL XR) 30 MG 24 hr capsule Take 30 mg by mouth 2 (two) times daily.  Marland Kitchen atorvastatin (LIPITOR) 40 MG tablet Take 40 mg by mouth at bedtime.   . benztropine (COGENTIN) 1 MG tablet Take 1 mg by mouth 2 (two) times daily. Dr. Evelene Croon  . Cholecalciferol (VITAMIN D3) 50 MCG (2000 UT) capsule Take 2,000 Units by mouth daily.  [name] 100 MG CAPS Take 100 mg by mouth daily.   . DULoxetine (CYMBALTA) 60 MG capsule Take 60 mg by mouth daily. Dr. Evelene Croon  . escitalopram (LEXAPRO) 20 MG tablet Take 10 mg by mouth daily. Dr. Evelene Croon  . furosemide (LASIX) 20 MG tablet Take 20 mg by mouth daily as needed. Dr. Judithann Sheen  . levothyroxine (SYNTHROID) 100 MCG tablet TAKE 1 TABLET EVERY DAY ON EMPTY STOMACHWITH A GLASS OF WATER AT LEAST 30-60 MINBEFORE BREAKFAST (Patient taking differently: Take 100 mcg by mouth daily before breakfast. )  . linaclotide (LINZESS) 145 MCG CAPS capsule Take 1 capsule (145 mcg total) by mouth daily before breakfast.  . Melatonin 10 MG TABS Take 10 mg by mouth at bedtime.  . Multiple Vitamin (MULTIVITAMIN) capsule Take by mouth.  . Omega-3 300 MG CAPS Take 1.2 g by mouth daily.   . OSPHENA 60 MG TABS Take 1 tablet by mouth daily. Dr Tiburcio Pea  . pantoprazole (PROTONIX) 40 MG tablet TAKE ONE TABLET EVERY DAY 15-20 MINUTES BEFORE A MEAL (Patient taking differently: Take 40 mg by mouth daily. )  . primidone (MYSOLINE) 50 MG tablet Take 50 mg by mouth in the morning and at bedtime. Dr. Judithann Sheen   . rOPINIRole (REQUIP) 1 MG tablet Take 1.5 mg by mouth at bedtime. Dr. Meredeth Ide  . topiramate (TOPAMAX) 200 MG tablet TAKE 1 TABLET BY MOUTH IN  THE MORNING AND 1 TABLET BY MOUTH IN THE EVENING  . traZODone (DESYREL) 100 MG tablet Take 150-200 mg by mouth at bedtime. Dr Evelene Croon  . ziprasidone (GEODON) 60 MG capsule Take 60 mg by mouth every evening. Dr. Evelene Croon    [facility] 2/9 Scores 08/14/2020 04/24/2020 02/20/2020 12/12/2019  PHQ - 2 Score 4 0 6 2  PHQ- 9 Score 21 - 19 21  Exception Documentation - Medical reason - -  Some encounter information is confidential and restricted. Go to Review Flowsheets activity to see all data.    GAD 7 : Generalized Anxiety Score 08/14/2020  Nervous, Anxious, on Edge 2  Control/stop worrying 3  Worry too  much - different things 3  Trouble relaxing 3  Restless 2  Easily annoyed or irritable 2  Afraid - awful might happen 2  Total GAD 7 Score 17  Anxiety Difficulty Somewhat difficult    BP Readings from Last 3 Encounters:  08/14/20 102/72  06/26/20 105/76  05/21/20 100/70    Physical Exam HENT:     Head: Normocephalic and atraumatic.  Neck:     Vascular: No carotid bruit.  Cardiovascular:     Rate and Rhythm: Normal rate and regular rhythm.     Pulses: Normal pulses.     Heart sounds: No murmur heard.   Pulmonary:     Effort: Pulmonary effort is normal.     Breath sounds: Normal breath sounds. No wheezing or rhonchi.  Abdominal:     General: Abdomen is flat. There is no distension.     Palpations: Abdomen is soft.     Tenderness: There is abdominal tenderness in the left lower quadrant. There is no guarding or rebound.  Musculoskeletal:     Cervical back: Normal range of motion. No tenderness.     Right knee: Crepitus present. No swelling, effusion or erythema.     Left knee: Crepitus present. No swelling, effusion or erythema.     Right ankle: No swelling. Decreased range of motion.     Left ankle: No swelling. Decreased range of motion.      Right foot: No deformity.     Left foot: No deformity.  Lymphadenopathy:     Cervical: No cervical adenopathy.  Neurological:     Mental Status: She is alert. She is confused.     Sensory: Sensation is intact.     Motor: Weakness (in walking) and tremor present.     Gait: Gait abnormal (uses rolator).     Comments: Gait not tested for safety - required 2 person assist to the exam table from the chair  Psychiatric:        Attention and Perception: Attention normal.        Mood and Affect: Mood is anxious.        Speech: Speech is delayed.        Behavior: Behavior is cooperative.        Cognition and Memory: Memory is impaired.     Wt Readings from Last 3 Encounters:  08/14/20 165 lb (74.8 kg)  06/25/20 166 lb 0.1 oz (75.3 kg)  05/21/20 166 lb (75.3 kg)    BP 102/72   Pulse 96   Temp 98.3 F (36.8 C) (Oral)   Ht 5\' 4"  (1.626 m)   Wt 165 lb (74.8 kg)   SpO2 98%   BMI 28.32 kg/m   Assessment and Plan: 1. Gastroesophageal reflux disease with esophagitis without hemorrhage Continue Protonix and bland diet - Ambulatory referral to Gastroenterology  2. Essential (primary) hypertension Controlled on lisinopril and prn lasix  3. Chronic idiopathic constipation Will try higher dose Linzess in advance of GI evaluation - Ambulatory referral to Gastroenterology - linaclotide (LINZESS) 290 MCG CAPS capsule; Take 1 capsule (290 mcg total) by mouth daily before breakfast.  Dispense: 30 capsule; Refill: 1  4. Type II diabetes mellitus with complication (HCC) Diet controlled  Last A1C 5.7 so can consider it resolved  5. Bipolar 2 disorder, major depressive episode (HCC) Followed closely by Psych and Therapist  6. Ingestion of bleach, intentional self-harm, subsequent encounter Now more abdominal pain and food intolerance - Ambulatory referral to Gastroenterology  7. Sarcoidosis of  lung (HCC) In remission per Dr. Meredeth IdeFleming Need to monitor for extra-pulmonary involvement  8.  Benign essential tremor On Primadone bid  9. Hypothyroidism, unspecified type Supplemented Recent TSH was normal  10. Need for immunization against influenza - Flu Vaccine QUAD 36+ mos IM  11. S/P total abdominal hysterectomy and bilateral salpingo-oophorectomy Pap smear discontinued  12. Chronic migraine without aura without status migrainosus, not intractable  13. Stage 3a chronic kidney disease (HCC) Followed by Nephrology  14. Depression, major, recurrent, severe with psychosis (HCC) On multiple medications from Psych  15. Seizure-like activity [facility](HCC) Awaiting Neurology evaluation  She is trying to get an electric wheelchair to be safely mobile in her home. Her frequent falls are contributed to by her tremor, weakness, myalgia and possible seizure disorder.  Will need to get a more definitive dx before the CME can be completed.  Partially dictated using Animal nutritionistDragon software. Any errors are unintentional.  Bari EdwardLaura Vinal Rosengrant, MD [facility]  08/14/2020

## 2020-08-22 ENCOUNTER — Other Ambulatory Visit: Payer: Self-pay | Admitting: Obstetrics & Gynecology

## 2020-08-23 DIAGNOSIS — R55 Syncope and collapse: Secondary | ICD-10-CM | POA: Diagnosis not present

## 2020-08-23 DIAGNOSIS — R569 Unspecified convulsions: Secondary | ICD-10-CM | POA: Diagnosis not present

## 2020-08-23 NOTE — Telephone Encounter (Signed)
Called and left voicemail for patient to call back to be scheduled. 

## 2020-08-24 DIAGNOSIS — R55 Syncope and collapse: Secondary | ICD-10-CM | POA: Diagnosis not present

## 2020-08-24 DIAGNOSIS — R569 Unspecified convulsions: Secondary | ICD-10-CM | POA: Diagnosis not present

## 2020-08-25 DIAGNOSIS — R55 Syncope and collapse: Secondary | ICD-10-CM | POA: Diagnosis not present

## 2020-08-25 DIAGNOSIS — R569 Unspecified convulsions: Secondary | ICD-10-CM | POA: Diagnosis not present

## 2020-08-26 DIAGNOSIS — R569 Unspecified convulsions: Secondary | ICD-10-CM | POA: Diagnosis not present

## 2020-08-30 ENCOUNTER — Other Ambulatory Visit: Payer: Self-pay | Admitting: Internal Medicine

## 2020-09-26 ENCOUNTER — Other Ambulatory Visit: Payer: Self-pay | Admitting: Internal Medicine

## 2020-09-26 NOTE — Telephone Encounter (Signed)
She is not our patient

## 2020-09-27 ENCOUNTER — Other Ambulatory Visit: Payer: Self-pay | Admitting: Internal Medicine

## 2020-09-30 ENCOUNTER — Telehealth: Payer: Self-pay | Admitting: Internal Medicine

## 2020-09-30 ENCOUNTER — Other Ambulatory Visit: Payer: Self-pay | Admitting: Internal Medicine

## 2020-09-30 NOTE — Telephone Encounter (Signed)
Left message for patient to call back and schedule Medicare Annual Wellness Visit (AWV) either virtually or in office. Whichever the patients preference is.  No history of AWV; please schedule at anytime with [facility]-Nurse Health Advisor.  This should be a 40 minute visit  AWV-I PER [place] AS OF 10/19/2009 

## 2020-10-04 ENCOUNTER — Other Ambulatory Visit: Payer: Self-pay | Admitting: Internal Medicine

## 2020-10-04 NOTE — Telephone Encounter (Signed)
Requested medication (s) are due for refill today -yes  Requested medication (s) are on the active medication list -yes  Future visit scheduled -yes  Last refill: 08/22/20  Notes to clinic: Patient is requesting medication refill on medication prescribed by outside provider  Requested Prescriptions  Pending Prescriptions Disp Refills   pantoprazole (PROTONIX) 40 MG tablet [Pharmacy Med Name: PANTOPRAZOLE SODIUM 40 MG DR TAB] 30 tablet 3    Sig: TAKE ONE TABLET EVERY DAY 15-20 MINUTES BEFORE A MEAL      Gastroenterology: Proton Pump Inhibitors Passed - 10/04/2020  4:15 PM      Passed - Valid encounter within last 12 months    Recent Outpatient Visits           1 month ago Gastroesophageal reflux disease with esophagitis without hemorrhage   [facility] Reubin Milan, MD   10 months ago Chronic pain of both shoulders   [facility] Osvaldo Angst M, [place]   10 months ago Multiple bruises   [facility] Osvaldo Angst M, [place]   12 months ago Epigastric pain   [facility] Osvaldo Angst M, [place]   1 year ago Dizziness   [facility] [place], [place]ella [place], [place]       Future Appointments             In 1 week Judithann Graves, Nyoka Cowden, MD [facility]                 Requested Prescriptions  Pending Prescriptions Disp Refills   pantoprazole (PROTONIX) 40 MG tablet [Pharmacy Med Name: PANTOPRAZOLE SODIUM 40 MG DR TAB] 30 tablet 3    Sig: TAKE ONE TABLET EVERY DAY 15-20 MINUTES BEFORE A MEAL      Gastroenterology: Proton Pump Inhibitors Passed - 10/04/2020  4:15 PM      Passed - Valid encounter within last 12 months    Recent Outpatient Visits           1 month ago Gastroesophageal reflux disease with esophagitis without hemorrhage   [facility] Reubin Milan, MD   10 months ago Chronic pain of both shoulders   [facility] Osvaldo Angst M, [place]   10 months ago  Multiple bruises   [facility] Osvaldo Angst M, [place]   12 months ago Epigastric pain   [facility] [place], [place], [place]   1 year ago Dizziness   [facility] [place], Lavella [place], [place]       Future Appointments             In 1 week Judithann Graves, Nyoka Cowden, MD [facility]

## 2020-10-14 ENCOUNTER — Encounter: Payer: Self-pay | Admitting: Internal Medicine

## 2020-10-14 ENCOUNTER — Ambulatory Visit (INDEPENDENT_AMBULATORY_CARE_PROVIDER_SITE_OTHER): Payer: Medicare Other | Admitting: Internal Medicine

## 2020-10-14 ENCOUNTER — Other Ambulatory Visit: Payer: Self-pay

## 2020-10-14 VITALS — BP 122/62 | HR 86 | Ht 64.0 in | Wt 162.0 lb

## 2020-10-14 DIAGNOSIS — E039 Hypothyroidism, unspecified: Secondary | ICD-10-CM | POA: Diagnosis not present

## 2020-10-14 DIAGNOSIS — M791 Myalgia, unspecified site: Secondary | ICD-10-CM

## 2020-10-14 DIAGNOSIS — K5904 Chronic idiopathic constipation: Secondary | ICD-10-CM

## 2020-10-14 DIAGNOSIS — I10 Essential (primary) hypertension: Secondary | ICD-10-CM

## 2020-10-14 DIAGNOSIS — E118 Type 2 diabetes mellitus with unspecified complications: Secondary | ICD-10-CM | POA: Diagnosis not present

## 2020-10-14 NOTE — Progress Notes (Signed)
Date:  10/14/2020   Name:  Jennifer Bright   DOB:  08/25/1966   MRN:  [id number]   Chief Complaint: Hypertension, Diabetes, and Hypothyroidism  Hypertension This is a chronic problem. The problem has been resolved since onset. Associated symptoms include chest pain (along the left anterior sternal border). Pertinent negatives include no headaches, palpitations or shortness of breath. There are no associated agents to hypertension. Past treatments include nothing. There are no compliance problems.  Identifiable causes of hypertension include a thyroid problem.  Diabetes She presents for her follow-up diabetic visit. She has type 2 (prediabetes) diabetes mellitus. Her disease course has been stable. Hypoglycemia symptoms include dizziness and nervousness/anxiousness. Pertinent negatives for hypoglycemia include no headaches. Associated symptoms include chest pain (along the left anterior sternal border), weakness and weight loss. Pertinent negatives for diabetes include no fatigue. Symptoms are stable. An ACE inhibitor/angiotensin II receptor blocker is not being taken.  Thyroid Problem Presents for follow-up visit. Symptoms include anxiety, constipation, depressed mood and weight loss. Patient reports no fatigue, leg swelling or palpitations. The symptoms have been stable.  Constipation This is a recurrent problem. The problem has been gradually improving since onset. Her stool frequency is 2 to 3 times per week. Associated symptoms include weight loss. Pertinent negatives include no abdominal pain or fever. Treatments tried: higher dose of linzess has helped.    Lab Results  Component Value Date   CREATININE 1.12 (H) 06/25/2020   BUN 26 (H) 06/25/2020   NA 145 06/25/2020   K 3.5 06/25/2020   CL 111 06/25/2020   CO2 24 06/25/2020   Lab Results  Component Value Date   CHOL 197 12/13/2019   HDL 50 12/13/2019   LDLCALC 111 (H) 12/13/2019   TRIG 207 (H) 12/13/2019   Lab Results   Component Value Date   TSH 0.740 06/27/2020   Lab Results  Component Value Date   HGBA1C 5.7 (A) 12/21/2019   Lab Results  Component Value Date   WBC 6.4 06/25/2020   HGB 14.4 06/25/2020   HCT 41.9 06/25/2020   MCV 88.0 06/25/2020   PLT 213 06/25/2020   Lab Results  Component Value Date   ALT 30 06/25/2020   AST 21 06/25/2020   ALKPHOS 61 06/25/2020   BILITOT 0.7 06/25/2020     Review of Systems  Constitutional: Positive for unexpected weight change (continues to slowly lose weight) and weight loss. Negative for chills, fatigue and fever.  HENT: Negative for trouble swallowing.   Eyes: Negative for visual disturbance.  Respiratory: Negative for cough, chest tightness and shortness of breath.   Cardiovascular: Positive for chest pain (along the left anterior sternal border). Negative for palpitations.  Gastrointestinal: Positive for constipation. Negative for abdominal pain and blood in stool.  Musculoskeletal: Positive for arthralgias and myalgias.  Neurological: Positive for dizziness, syncope, weakness and light-headedness. Negative for headaches.  Psychiatric/Behavioral: Positive for dysphoric mood. The patient is nervous/anxious.     Patient Active Problem List   Diagnosis Date Noted  . S/P total abdominal hysterectomy and bilateral salpingo-oophorectomy 08/14/2020  . Chronic idiopathic constipation 08/14/2020  . Bleach ingestion 06/27/2020  . Depression, major, recurrent, severe with psychosis (HCC) 06/26/2020  . Seizure-like activity (HCC) 06/04/2020  . Urinary incontinence without sensory awareness 05/21/2020  . Bipolar 2 disorder, major depressive episode (HCC) 09/19/2019  . Stage 3a chronic kidney disease (HCC) 07/10/2019  . Proteinuria 07/10/2019  . Myalgia 12/30/2017  . Vaginal atrophy 07/12/2017  . Polypharmacy 08/14/2016  .  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 Hypercalcemia 07/24/2016  . Hyperlipidemia, unspecified 11/15/2015  . Type II diabetes  mellitus with complication (HCC) 11/15/2015  . Anxiety disorder 07/29/2015  . Essential (primary) hypertension 05/12/2015  . Benign essential tremor 05/12/2015  . GERD with esophagitis 05/12/2015  . Hypersomnia with sleep apnea 05/12/2015  . Restless leg 05/12/2015  . Acquired hypothyroidism 05/12/2015  . Chronic migraine without aura 09/03/2012  . 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 Cleocin [Clindamycin Hcl] Other (See Comments)    GI distress    Past Surgical History:  Procedure Laterality Date  . ABDOMINAL HYSTERECTOMY    . APPENDECTOMY    . BLADDER SURGERY     bladder tact  . CATARACT EXTRACTION W/PHACO Right 09/21/2018   Procedure: CATARACT EXTRACTION PHACO AND INTRAOCULAR LENS PLACEMENT (IOC);  Surgeon: Nevada Crane, MD;  Location: [facility] ORS;  Service: Ophthalmology;  Laterality: Right;  [place]  00:20 CDE 00.82 Fluid pack lot # [id number]  . CATARACT EXTRACTION W/PHACO Left 10/17/2018   Procedure: CATARACT EXTRACTION PHACO AND INTRAOCULAR LENS PLACEMENT (IOC)  LEFT;  Surgeon: Nevada Crane, MD;  Location: [facility];  Service: Ophthalmology;  Laterality: Left;  diabetic - diet controlled  . CESAREAN SECTION    . CHOLECYSTECTOMY    . COLONOSCOPY WITH PROPOFOL N/A 04/05/2017   Procedure: COLONOSCOPY WITH PROPOFOL;  Surgeon: Christena Deem, MD;  Location: [facility] ENDOSCOPY;  Service: Endoscopy;  Laterality: N/A;  . ESOPHAGOGASTRODUODENOSCOPY (EGD) WITH PROPOFOL N/A 08/28/2016   Procedure: ESOPHAGOGASTRODUODENOSCOPY (EGD) WITH PROPOFOL;  Surgeon: Christena Deem, MD;  Location: [facility] ENDOSCOPY;  Service: Endoscopy;  Laterality: N/A;  . HERNIA REPAIR    . INGUINAL HERNIA REPAIR     rt and left  . lexiscan cardiolite    . TILT TABLE STUDY N/A 04/01/2012   Procedure: TILT TABLE STUDY;  Surgeon: Duke Salvia, MD;  Location: [facility] CATH LAB;  Service: Cardiovascular;  Laterality: N/A;    Social History   Tobacco Use  .  Smoking status: Former Smoker    Types: Cigarettes    Quit date: 2018    Years since quitting: 3.9  . Smokeless tobacco: Never Used  Vaping Use  . Vaping Use: Never used  Substance Use Topics  . Alcohol use: No  . Drug use: No     Medication list has been reviewed and updated.  Current Meds  Medication Sig  . ALPRAZolam (XANAX XR) 1 MG 24 hr tablet Take 1-3 mg by mouth daily. Dr. Evelene Croon  . ALPRAZolam (XANAX) 1 MG tablet Take 1 mg by mouth at bedtime as needed for anxiety. Dr. Evelene Croon  . amphetamine-dextroamphetamine (ADDERALL XR) 30 MG 24 hr capsule Take 30 mg by mouth 2 (two) times daily.  Marland Kitchen atorvastatin (LIPITOR) 40 MG tablet Take 40 mg by mouth at bedtime. Prescribed by PCP  . benztropine (COGENTIN) 1 MG tablet Take 1 mg by mouth 2 (two) times daily. Dr. Evelene Croon  . Cholecalciferol (VITAMIN D3) 50 MCG (2000 UT) capsule Take 2,000 Units by mouth daily.  [name] 100 MG CAPS Take 100 mg by mouth daily.   . DULoxetine (CYMBALTA) 60 MG capsule Take 60 mg by mouth daily. Dr. Evelene Croon  . furosemide (LASIX) 20 MG tablet Take 20 mg by mouth daily as needed. Prescribed by PCP  . levothyroxine (SYNTHROID) 100 MCG tablet TAKE 1 TABLET EVERY DAY ON EMPTY STOMACHWITH A GLASS OF WATER AT LEAST 30-60 MINBEFORE  BREAKFAST (Patient taking differently: Take 100 mcg by mouth daily before breakfast.)  . linaclotide (LINZESS) 290 MCG CAPS capsule Take 1 capsule (290 mcg total) by mouth daily before breakfast. (Patient taking differently: Take 290 mcg by mouth daily as needed.)  . Melatonin 10 MG TABS Take 10 mg by mouth at bedtime.  . Multiple Vitamin (MULTIVITAMIN) capsule Take by mouth.  . Omega-3 300 MG CAPS Take 1.2 g by mouth daily.   . pantoprazole (PROTONIX) 40 MG tablet TAKE ONE TABLET EVERY DAY 15-20 MINUTES BEFORE A MEAL  . primidone (MYSOLINE) 50 MG tablet Take 50 mg by mouth in the morning and at bedtime. 50 mg in AM 100 mg in PM Prescribed by PCP  . rOPINIRole (REQUIP) 1 MG tablet Take  1.5 mg by mouth at bedtime. Dr. Meredeth Ide  . topiramate (TOPAMAX) 200 MG tablet TAKE 1 TABLET BY MOUTH IN  THE MORNING AND 1 TABLET BY MOUTH IN THE EVENING  . traZODone (DESYREL) 100 MG tablet Take 150-200 mg by mouth at bedtime. Dr Evelene Croon  . ziprasidone (GEODON) 60 MG capsule Take 120 mg by mouth every evening. Dr. Evelene Croon    [facility] 2/9 Scores 10/14/2020 08/14/2020 04/24/2020 02/20/2020  PHQ - 2 Score 4 4 0 6  PHQ- 9 Score 19 21 - 19  Exception Documentation - - Medical reason -  Some encounter information is confidential and restricted. Go to Review Flowsheets activity to see all data.    GAD 7 : Generalized Anxiety Score 10/14/2020 08/14/2020  Nervous, Anxious, on Edge 3 2  Control/stop worrying 3 3  Worry too much - different things 3 3  Trouble relaxing 3 3  Restless 2 2  Easily annoyed or irritable 3 2  Afraid - awful might happen 3 2  Total GAD 7 Score 20 17  Anxiety Difficulty Very difficult Somewhat difficult    BP Readings from Last 3 Encounters:  10/14/20 122/62  08/14/20 102/72  06/26/20 105/76    Physical Exam Vitals and nursing note reviewed.  Constitutional:      General: She is not in acute distress.    Appearance: She is well-developed.  HENT:     Head: Normocephalic and atraumatic.  Cardiovascular:     Rate and Rhythm: Normal rate and regular rhythm.     Pulses: Normal pulses.     Heart sounds: No murmur heard.   Pulmonary:     Effort: Pulmonary effort is normal. No respiratory distress.     Breath sounds: No wheezing, rhonchi or rales.  Abdominal:     General: Abdomen is flat.     Palpations: Abdomen is soft.  Musculoskeletal:     Cervical back: No tenderness.     Right lower leg: No edema.     Left lower leg: No edema.  Lymphadenopathy:     Cervical: No cervical adenopathy.  Skin:    General: Skin is warm and dry.     Findings: No rash.  Neurological:     Mental Status: She is alert and oriented to person, place, and time.     Motor: Weakness  present.     Gait: Gait abnormal (in wheelchair due to falls).  Psychiatric:        Mood and Affect: Mood and affect normal.        Behavior: Behavior normal.        Thought Content: Thought content normal.     Wt Readings from Last 3 Encounters:  10/14/20 162 lb (73.5 kg)  08/14/20 165 lb (74.8 kg)  06/25/20 166 lb 0.1 oz (75.3 kg)    BP 122/62   Pulse 86   Ht 5\' 4"  (1.626 m)   Wt 162 lb (73.5 kg)   SpO2 97%   BMI 27.81 kg/m   Assessment and Plan: 1. Type II diabetes mellitus with complication (HCC) Likely resolved with significant weight loss Will recheck labs and if normal A1c will resolve DM and change dx to Prediabetes - Basic metabolic panel - Hemoglobin A1c  2. Essential (primary) hypertension BP is normal and she is not on any antihypertensives Will resolve  3. Acquired hypothyroidism Supplemented - check labs and adjust dose as needed - TSH + free T4  4. Chronic idiopathic constipation Improved bowel function on higher dose of Linzess which she is taking only several times per week Continue current regimen Awaiting GI consultation   Partially dictated using Animal nutritionistDragon software. Any errors are unintentional.  Bari EdwardLaura Davin Muramoto, MD [facility]  10/14/2020

## 2020-10-15 ENCOUNTER — Encounter: Payer: Self-pay | Admitting: Internal Medicine

## 2020-10-15 LAB — BASIC METABOLIC PANEL
BUN/Creatinine Ratio: 15 (ref 9–23)
BUN: 17 mg/dL (ref 6–24)
CO2: 21 mmol/L (ref 20–29)
Calcium: 9.5 mg/dL (ref 8.7–10.2)
Chloride: 103 mmol/L (ref 96–106)
Creatinine, Ser: 1.12 mg/dL — ABNORMAL HIGH (ref 0.57–1.00)
GFR calc Af Amer: 65 mL/min/{1.73_m2} (ref >59)
GFR calc non Af Amer: 56 mL/min/{1.73_m2} — ABNORMAL LOW (ref >59)
Glucose: 103 mg/dL — ABNORMAL HIGH (ref 65–99)
Potassium: 3.7 mmol/L (ref 3.5–5.2)
Sodium: 140 mmol/L (ref 134–144)

## 2020-10-15 LAB — TSH+FREE T4
Free T4: 0.95 ng/dL (ref 0.82–1.77)
TSH: 2.19 u[IU]/mL (ref 0.450–4.500)

## 2020-10-15 LAB — HEMOGLOBIN A1C
Est. average glucose Bld gHb Est-mCnc: 123 mg/dL
Hgb A1c MFr Bld: 5.9 % — ABNORMAL HIGH (ref 4.8–5.6)

## 2020-10-21 ENCOUNTER — Telehealth: Payer: Self-pay

## 2020-10-21 ENCOUNTER — Other Ambulatory Visit: Payer: Self-pay

## 2020-10-21 ENCOUNTER — Telehealth (INDEPENDENT_AMBULATORY_CARE_PROVIDER_SITE_OTHER): Payer: Medicare Other | Admitting: Gastroenterology

## 2020-10-21 DIAGNOSIS — K219 Gastro-esophageal reflux disease without esophagitis: Secondary | ICD-10-CM | POA: Diagnosis not present

## 2020-10-21 DIAGNOSIS — T5492XA Toxic effect of unspecified corrosive substance, intentional self-harm, initial encounter: Secondary | ICD-10-CM

## 2020-10-21 DIAGNOSIS — K5900 Constipation, unspecified: Secondary | ICD-10-CM | POA: Diagnosis not present

## 2020-10-21 MED ORDER — PANTOPRAZOLE SODIUM 40 MG PO TBEC: 40.0000 mg | DELAYED_RELEASE_TABLET | Freq: Two times a day (BID) | ORAL | 5 refills | Status: DC

## 2020-10-21 NOTE — H&P (View-Only) (Signed)
Jennifer Lame, MD [address]  [place]  [place],  [address]  Main: [phone]  Fax: [phone]    Gastroenterology Virtual/Video Visit  Referring Provider:     Glean Hess, MD Primary Care Physician:  Glean Hess, MD Primary Gastroenterologist:  Dr.Alven Alverio Allen Norris Reason for Consultation:     Chronic constipation, family history of colon cancer in a first-degree relative and recent ingestion of bleach.        HPI:    Virtual Visit via Video Note Location of the patient: Home Location of provider: Home Participating persons: The patient myself and Ginger Feldpausch.  I connected with Jennifer Bright on 10/21/20 at  2:00 PM EST by a video enabled telemedicine application and verified that I am speaking with the correct person using two identifiers.   I discussed the limitations of evaluation and management by telemedicine and the availability of in person appointments. The patient expressed understanding and agreed to proceed.  Verbal consent to proceed obtained.  History of Present Illness: Jennifer Bright is a 55 y.o. female referred by Dr. Army Melia, Jesse Sans, MD   after being seen in the past by Dr. Gustavo Lah.  The patient had a colonoscopy in 2018 without anything being seen except diverticulosis.  The patient also had an upper endoscopy in 2017 for hematemesis with esophagitis found.  The patient was recently in the emergency room and was reported to have drank about a half a cup of bleach because of hearing that it would help her go to sleep.  The patient was evaluated by psychiatry at that time.  It does not appear that she was seen by GI during that visit. The patient has a history of bipolar depression with psychosis.  She also has a history of migraine headaches anxiety and hypothyroidism.  The patient was in the hospital from September 7 to the 10th. The patients father had colon cancer and died at 74 year old.    Past Medical History:   Diagnosis Date   Acid reflux    Anemia    Anxiety    Bipolar 1 disorder, manic, moderate ([place]) 09/19/2019   Bipolar depression ([place]) 05/12/2015   Bipolar disorder ([place])    Bipolar disorder ([place]) 05/12/2015   Cataract    Chronic kidney disease    STAGE 3   Depression    Diabetes mellitus    NIDD   Essential tremor    Family history of breast cancer    doesn't meet medicare guidelines for cancer genetic testing.    Family history of ovarian cancer    Pt doesn't meet Medicare genetic testing guidelines   Hyperlipidemia    Hypothyroid    IBS (irritable bowel syndrome)    Migraine headache    vestibular migraine   Obesity    Osteopenia    Restless leg syndrome    Sarcoidosis    Self-inflicted laceration of left wrist ([place]) 09/19/2019   Sleep apnea    Syncope \    Past Surgical History:  Procedure Laterality Date   ABDOMINAL HYSTERECTOMY     APPENDECTOMY     BLADDER SURGERY     bladder tact   CATARACT EXTRACTION W/PHACO Right 09/21/2018   Procedure: CATARACT EXTRACTION PHACO AND INTRAOCULAR LENS PLACEMENT ([place]);  Surgeon: [name], MD;  Location: [facility] ORS;  Service: Ophthalmology;  Laterality: Right;  [place]  00:20 CDE 00.82 Fluid pack lot # [id number]   CATARACT EXTRACTION W/PHACO Left 10/17/2018  Procedure: CATARACT EXTRACTION PHACO AND INTRAOCULAR LENS PLACEMENT (IOC)  LEFT;  Surgeon: [place] Crane, MD;  Location: [facility];  Service: Ophthalmology;  Laterality: Left;  diabetic - diet controlled   CESAREAN SECTION     CHOLECYSTECTOMY     COLONOSCOPY WITH PROPOFOL N/A 04/05/2017   Procedure: COLONOSCOPY WITH PROPOFOL;  Surgeon: Christena Deem, MD;  Location: [facility] ENDOSCOPY;  Service: Endoscopy;  Laterality: N/A;   ESOPHAGOGASTRODUODENOSCOPY (EGD) WITH PROPOFOL N/A 08/28/2016   Procedure: ESOPHAGOGASTRODUODENOSCOPY (EGD) WITH PROPOFOL;  Surgeon: Christena Deem, MD;  Location: [facility] ENDOSCOPY;  Service: Endoscopy;   Laterality: N/A;   HERNIA REPAIR     INGUINAL HERNIA REPAIR     rt and left   lexiscan cardiolite     TILT TABLE STUDY N/A 04/01/2012   Procedure: TILT TABLE STUDY;  Surgeon: Duke Salvia, MD;  [facility]: [facility] CATH LAB;  Service: Cardiovascular;  Laterality: N/A;    Prior to Admission medications   Medication Sig Start Date End Date Taking? Authorizing Provider  ALPRAZolam (XANAX XR) 1 MG 24 hr tablet Take 1-3 mg by mouth daily. Dr. Evelene Croon    [provider]  ALPRAZolam Prudy Feeler) 1 MG tablet Take 1 mg by mouth at bedtime as needed for anxiety. Dr. Evelene Croon    [provider]  amphetamine-dextroamphetamine (ADDERALL XR) 30 MG 24 hr capsule Take 30 mg by mouth 2 (two) times daily.    [provider]  atorvastatin (LIPITOR) 40 MG tablet Take 40 mg by mouth at bedtime. Prescribed by PCP    [provider]  benztropine (COGENTIN) 1 MG tablet Take 1 mg by mouth 2 (two) times daily. Dr. Evelene Croon    [provider]  Cholecalciferol (VITAMIN D3) 50 MCG (2000 UT) capsule Take 2,000 Units by mouth daily.    [provider]  Docusate Sodium (DSS) 100 MG CAPS Take 100 mg by mouth daily.     [provider]  DULoxetine (CYMBALTA) 60 MG capsule Take 60 mg by mouth daily. Dr. Evelene Croon 07/29/20   [provider]  furosemide (LASIX) 20 MG tablet Take 20 mg by mouth daily as needed. Prescribed by PCP 04/10/20   [provider]  levothyroxine (SYNTHROID) 100 MCG tablet TAKE 1 TABLET EVERY DAY ON EMPTY STOMACHWITH A GLASS OF WATER AT LEAST 30-60 MINBEFORE BREAKFAST Patient taking differently: Take 100 mcg by mouth daily before breakfast. 04/10/20   Lyndon Code, MD  linaclotide [facility]) 290 MCG CAPS capsule Take 1 capsule (290 mcg total) by mouth daily before breakfast. Patient taking differently: Take 290 mcg by mouth daily as needed. 08/14/20   Reubin Milan, MD  Melatonin 10 MG TABS Take 10 mg by mouth at bedtime.    [provider]  Multiple Vitamin (MULTIVITAMIN) capsule Take by mouth.    [provider]  Omega-3 300 MG CAPS Take 1.2 g by mouth daily.     [provider]  pantoprazole (PROTONIX) 40 MG tablet TAKE ONE TABLET EVERY DAY 15-20 MINUTES BEFORE A MEAL 10/07/20   Reubin Milan, MD  primidone (MYSOLINE) 50 MG tablet Take 50 mg by mouth in the morning and at bedtime. 50 mg in AM 100 mg in PM Prescribed by PCP    [provider]  rOPINIRole (REQUIP) 1 MG tablet Take 1.5 mg by mouth at bedtime. Dr. Meredeth Ide 07/16/20   [provider]  topiramate (TOPAMAX) 200 MG tablet TAKE 1 TABLET BY MOUTH IN  THE MORNING AND  1 TABLET BY MOUTH IN THE EVENING 04/02/20   Lyndon Code, MD  traZODone (DESYREL) 100 MG tablet Take 150-200 mg by mouth at bedtime. Dr Evelene Croon 08/05/20   [provider]  ziprasidone (GEODON) 60 MG capsule Take 120 mg by mouth every evening. Dr. Evelene Croon 04/06/19   [provider]    Family History  Problem Relation Age of Onset   Colon cancer Father    Stomach cancer Father    Hypertension Mother    Hyperlipidemia Mother    Stroke Mother    Alcohol abuse Brother    Irritable bowel syndrome Sister    Breast cancer Paternal Aunt    Ovarian cancer Paternal Aunt    Cancer Cousin    Cancer Cousin    Ovarian cancer Cousin    Kidney cancer Neg Hx    Kidney disease Neg Hx    Prostate cancer Neg Hx      Social History   Tobacco Use   Smoking status: Former Smoker    Types: Cigarettes    Quit date: 2018    Years since quitting: 4.0   Smokeless tobacco: Never Used  Building services engineer Use: Never used  Substance Use Topics   Alcohol use: No   Drug use: No    Allergies as of 10/21/2020 - Review Complete 10/14/2020  Allergen Reaction Noted   Cleocin [clindamycin hcl] Other (See Comments) 07/29/2015    Review of Systems:    All systems reviewed and negative except where noted in  HPI.   Observations/Objective:  Labs: CBC    Component Value Date/Time   WBC 6.4 06/25/2020 1716   RBC 4.76 06/25/2020 1716   HGB 14.4 06/25/2020 1716   HGB 14.0 12/13/2019 0709   HCT 41.9 06/25/2020 1716   HCT 40.9 12/13/2019 0709   PLT 213 06/25/2020 1716   PLT 206 12/13/2019 0709   MCV 88.0 06/25/2020 1716   MCV 89 12/13/2019 0709   MCV 90 10/10/2014 1519   MCH 30.3 06/25/2020 1716   MCHC 34.4 06/25/2020 1716   RDW 12.7 06/25/2020 1716   RDW 11.8 12/13/2019 0709   RDW 13.2 10/10/2014 1519   LYMPHSABS 2.1 12/13/2019 0709   LYMPHSABS 1.5 10/10/2014 1519   MONOABS 0.4 09/18/2019 1658   MONOABS 0.3 10/10/2014 1519   EOSABS 0.3 12/13/2019 0709   EOSABS 0.4 10/10/2014 1519   BASOSABS 0.1 12/13/2019 0709   BASOSABS 0.1 10/10/2014 1519   CMP     Component Value Date/Time   NA 140 10/14/2020 1636   NA 143 10/10/2014 1519   K 3.7 10/14/2020 1636   K 3.8 10/10/2014 1519   CL 103 10/14/2020 1636   CL 106 10/10/2014 1519   CO2 21 10/14/2020 1636   CO2 25 10/10/2014 1519   GLUCOSE 103 (H) 10/14/2020 1636   GLUCOSE 119 (H) 06/25/2020 1716   GLUCOSE 180 (H) 10/10/2014 1519   BUN 17 10/14/2020 1636   BUN 14 10/10/2014 1519   CREATININE 1.12 (H) 10/14/2020 1636   CREATININE 0.79 10/10/2014 1519   CALCIUM 9.5 10/14/2020 1636   CALCIUM 8.8 10/10/2014 1519   PROT 7.6 06/25/2020 1716   PROT 6.2 12/13/2019 0709   PROT 7.4 10/10/2014 1519   ALBUMIN 4.6 06/25/2020 1716   ALBUMIN 4.5 12/13/2019 0709   ALBUMIN 3.9 10/10/2014 1519   AST 21 06/25/2020 1716   AST 12 (L) 10/10/2014 1519   ALT 30 06/25/2020 1716   ALT 38 10/10/2014 1519  ALKPHOS 61 06/25/2020 1716  ° ALKPHOS 93 10/10/2014 1519  ° BILITOT 0.7 06/25/2020 1716  ° BILITOT <0.2 12/13/2019 0709  ° BILITOT 0.3 10/10/2014 1519  ° GFRNONAA 56 (L) 10/14/2020 1636  ° GFRNONAA >60 10/10/2014 1519  ° GFRNONAA >60 05/19/2014 0503  ° GFRAA 65 10/14/2020 1636  ° GFRAA >60 10/10/2014 1519  ° GFRAA >60 05/19/2014 0503  ° ° °Imaging  Studies: °No results found. ° °Assessment and Plan:  ° °Jennifer Bright is a 54 y.o. y/o female has been referred for family history of colon cancer in her father and constipation.  The patient is not in need of a colonoscopy until 2023.  She is doing well on her laxative the present time. The patient is concerned because her mother had Barrett's esophagus.  She continues to have burning in her throat and is set up with ENT already.  She will increase her Protonix to twice a day and has been given a prescription for this.  She will also be set up for an upper endoscopy for a baseline after her bleach ingestion and she has been told after 10 years from the time of ingestion she will need an upper endoscopy every 2-3 years due to the risk of esophageal cancer.  The patient and her husband have explained the plan and agrees with it. ° °Follow Up Instructions: ° °I discussed the assessment and treatment plan with the patient. The patient was provided an opportunity to ask questions and all were answered. The patient agreed with the plan and demonstrated an understanding of the instructions. °  °The patient was advised to call back or seek an in-person evaluation if the symptoms worsen or if the condition fails to improve as anticipated. ° °I provided 20 minutes of non-face-to-face time during this encounter. ° ° °Kourtnie Sachs, MD ° °Speech recognition software was used to dictate the above note.  °

## 2020-10-21 NOTE — Telephone Encounter (Signed)
Left vm for pt to return my call to schedule EGD.  

## 2020-10-21 NOTE — Progress Notes (Signed)
[name], MD [address]  [place]  [place],  [address]  Main: [phone]  Fax: [phone]    Gastroenterology Virtual/Video Visit  Referring Provider:     Glean Hess, MD Primary Care Physician:  Glean Hess, MD Primary Gastroenterologist:  Dr.Ashyia Schraeder Allen Norris Reason for Consultation:     Chronic constipation, family history of colon cancer in a first-degree relative and recent ingestion of bleach.        HPI:    Virtual Visit via Video Note Location of the patient: Home Location of provider: Home Participating persons: The patient myself and Ginger Feldpausch.  I connected with MULKI ROESLER on 10/21/20 at  2:00 PM EST by a video enabled telemedicine application and verified that I am speaking with the correct person using two identifiers.   I discussed the limitations of evaluation and management by telemedicine and the availability of in person appointments. The patient expressed understanding and agreed to proceed.  Verbal consent to proceed obtained.  History of Present Illness: NEYA CREEGAN is a 55 y.o. female referred by Dr. Army Melia, Jesse Sans, MD   after being seen in the past by Dr. Gustavo Lah.  The patient had a colonoscopy in 2018 without anything being seen except diverticulosis.  The patient also had an upper endoscopy in 2017 for hematemesis with esophagitis found.  The patient was recently in the emergency room and was reported to have drank about a half a cup of bleach because of hearing that it would help her go to sleep.  The patient was evaluated by psychiatry at that time.  It does not appear that she was seen by GI during that visit. The patient has a history of bipolar depression with psychosis.  She also has a history of migraine headaches anxiety and hypothyroidism.  The patient was in the hospital from September 7 to the 10th. The patients father had colon cancer and died at 74 year old.    Past Medical History:   Diagnosis Date   Acid reflux    Anemia    Anxiety    Bipolar 1 disorder, manic, moderate ([place]) 09/19/2019   Bipolar depression ([place]) 05/12/2015   Bipolar disorder ([place])    Bipolar disorder ([place]) 05/12/2015   Cataract    Chronic kidney disease    STAGE 3   Depression    Diabetes mellitus    NIDD   Essential tremor    Family history of breast cancer    doesn't meet medicare guidelines for cancer genetic testing.    Family history of ovarian cancer    Pt doesn't meet Medicare genetic testing guidelines   Hyperlipidemia    Hypothyroid    IBS (irritable bowel syndrome)    Migraine headache    vestibular migraine   Obesity    Osteopenia    Restless leg syndrome    Sarcoidosis    Self-inflicted laceration of left wrist ([place]) 09/19/2019   Sleep apnea    Syncope \    Past Surgical History:  Procedure Laterality Date   ABDOMINAL HYSTERECTOMY     APPENDECTOMY     BLADDER SURGERY     bladder tact   CATARACT EXTRACTION W/PHACO Right 09/21/2018   Procedure: CATARACT EXTRACTION PHACO AND INTRAOCULAR LENS PLACEMENT ([place]);  Surgeon: [name], MD;  Location: [facility] ORS;  Service: Ophthalmology;  Laterality: Right;  [place]  00:20 CDE 00.82 Fluid pack lot # [id number]   CATARACT EXTRACTION W/PHACO Left 10/17/2018  Procedure: CATARACT EXTRACTION PHACO AND INTRAOCULAR LENS PLACEMENT ([facility])  LEFT;  Surgeon: [place] Crane, MD;  Location: [facility];  Service: Ophthalmology;  Laterality: Left;  diabetic - diet controlled   CESAREAN SECTION     CHOLECYSTECTOMY     COLONOSCOPY WITH PROPOFOL N/A 04/05/2017   Procedure: COLONOSCOPY WITH PROPOFOL;  Surgeon: Christena Deem, MD;  Location: [facility] ENDOSCOPY;  Service: Endoscopy;  Laterality: N/A;   ESOPHAGOGASTRODUODENOSCOPY (EGD) WITH PROPOFOL N/A 08/28/2016   Procedure: ESOPHAGOGASTRODUODENOSCOPY (EGD) WITH PROPOFOL;  Surgeon: Christena Deem, MD;  Location: [facility] ENDOSCOPY;  Service: Endoscopy;   Laterality: N/A;   HERNIA REPAIR     INGUINAL HERNIA REPAIR     rt and left   lexiscan cardiolite     TILT TABLE STUDY N/A 04/01/2012   Procedure: TILT TABLE STUDY;  Surgeon: Duke Salvia, MD;  Location: [facility] CATH LAB;  Service: Cardiovascular;  Laterality: N/A;    Prior to Admission medications   Medication Sig Start Date End Date Taking? Authorizing Provider  ALPRAZolam (XANAX XR) 1 MG 24 hr tablet Take 1-3 mg by mouth daily. Dr. Evelene Croon    [provider]  ALPRAZolam Prudy Feeler) 1 MG tablet Take 1 mg by mouth at bedtime as needed for anxiety. Dr. Evelene Croon    [provider]  amphetamine-dextroamphetamine (ADDERALL XR) 30 MG 24 hr capsule Take 30 mg by mouth 2 (two) times daily.    [provider]  atorvastatin (LIPITOR) 40 MG tablet Take 40 mg by mouth at bedtime. Prescribed by PCP    [provider]  benztropine (COGENTIN) 1 MG tablet Take 1 mg by mouth 2 (two) times daily. Dr. Evelene Croon    [provider]  Cholecalciferol (VITAMIN D3) 50 MCG (2000 UT) capsule Take 2,000 Units by mouth daily.    [provider]  Docusate Sodium (DSS) 100 MG CAPS Take 100 mg by mouth daily.     [provider]  DULoxetine (CYMBALTA) 60 MG capsule Take 60 mg by mouth daily. Dr. Evelene Croon 07/29/20   [provider]  furosemide (LASIX) 20 MG tablet Take 20 mg by mouth daily as needed. Prescribed by PCP 04/10/20   [provider]  levothyroxine (SYNTHROID) 100 MCG tablet TAKE 1 TABLET EVERY DAY ON EMPTY STOMACHWITH A GLASS OF WATER AT LEAST 30-60 MINBEFORE BREAKFAST Patient taking differently: Take 100 mcg by mouth daily before breakfast. 04/10/20   Lyndon Code, MD  linaclotide [facility]) 290 MCG CAPS capsule Take 1 capsule (290 mcg total) by mouth daily before breakfast. Patient taking differently: Take 290 mcg by mouth daily as needed. 08/14/20   Reubin Milan, MD  Melatonin 10 MG TABS Take 10 mg by mouth at bedtime.    [provider]  Multiple Vitamin (MULTIVITAMIN) capsule Take by mouth.    [provider]  Omega-3 300 MG CAPS Take 1.2 g by mouth daily.     [provider]  pantoprazole (PROTONIX) 40 MG tablet TAKE ONE TABLET EVERY DAY 15-20 MINUTES BEFORE A MEAL 10/07/20   Reubin Milan, MD  primidone (MYSOLINE) 50 MG tablet Take 50 mg by mouth in the morning and at bedtime. 50 mg in AM 100 mg in PM Prescribed by PCP    [provider]  rOPINIRole (REQUIP) 1 MG tablet Take 1.5 mg by mouth at bedtime. Dr. Meredeth Ide 07/16/20   [provider]  topiramate (TOPAMAX) 200 MG tablet TAKE 1 TABLET BY MOUTH IN  THE MORNING AND  1 TABLET BY MOUTH IN THE EVENING 04/02/20   Lyndon Code, MD  traZODone (DESYREL) 100 MG tablet Take 150-200 mg by mouth at bedtime. Dr Evelene Croon 08/05/20   [provider]  ziprasidone (GEODON) 60 MG capsule Take 120 mg by mouth every evening. Dr. Evelene Croon 04/06/19   [provider]    Family History  Problem Relation Age of Onset   Colon cancer Father    Stomach cancer Father    Hypertension Mother    Hyperlipidemia Mother    Stroke Mother    Alcohol abuse Brother    Irritable bowel syndrome Sister    Breast cancer Paternal Aunt    Ovarian cancer Paternal Aunt    Cancer Cousin    Cancer Cousin    Ovarian cancer Cousin    Kidney cancer Neg Hx    Kidney disease Neg Hx    Prostate cancer Neg Hx      Social History   Tobacco Use   Smoking status: Former Smoker    Types: Cigarettes    Quit date: 2018    Years since quitting: 4.0   Smokeless tobacco: Never Used  Building services engineer Use: Never used  Substance Use Topics   Alcohol use: No   Drug use: No    Allergies as of 10/21/2020 - Review Complete 10/14/2020  Allergen Reaction Noted   Cleocin [clindamycin hcl] Other (See Comments) 07/29/2015    Review of Systems:    All systems reviewed and negative except where noted in  HPI.   Observations/Objective:  Labs: CBC    Component Value Date/Time   WBC 6.4 06/25/2020 1716   RBC 4.76 06/25/2020 1716   HGB 14.4 06/25/2020 1716   HGB 14.0 12/13/2019 0709   HCT 41.9 06/25/2020 1716   HCT 40.9 12/13/2019 0709   PLT 213 06/25/2020 1716   PLT 206 12/13/2019 0709   MCV 88.0 06/25/2020 1716   MCV 89 12/13/2019 0709   MCV 90 10/10/2014 1519   MCH 30.3 06/25/2020 1716   MCHC 34.4 06/25/2020 1716   RDW 12.7 06/25/2020 1716   RDW 11.8 12/13/2019 0709   RDW 13.2 10/10/2014 1519   LYMPHSABS 2.1 12/13/2019 0709   LYMPHSABS 1.5 10/10/2014 1519   MONOABS 0.4 09/18/2019 1658   MONOABS 0.3 10/10/2014 1519   EOSABS 0.3 12/13/2019 0709   EOSABS 0.4 10/10/2014 1519   BASOSABS 0.1 12/13/2019 0709   BASOSABS 0.1 10/10/2014 1519   CMP     Component Value Date/Time   NA 140 10/14/2020 1636   NA 143 10/10/2014 1519   K 3.7 10/14/2020 1636   K 3.8 10/10/2014 1519   CL 103 10/14/2020 1636   CL 106 10/10/2014 1519   CO2 21 10/14/2020 1636   CO2 25 10/10/2014 1519   GLUCOSE 103 (H) 10/14/2020 1636   GLUCOSE 119 (H) 06/25/2020 1716   GLUCOSE 180 (H) 10/10/2014 1519   BUN 17 10/14/2020 1636   BUN 14 10/10/2014 1519   CREATININE 1.12 (H) 10/14/2020 1636   CREATININE 0.79 10/10/2014 1519   CALCIUM 9.5 10/14/2020 1636   CALCIUM 8.8 10/10/2014 1519   PROT 7.6 06/25/2020 1716   PROT 6.2 12/13/2019 0709   PROT 7.4 10/10/2014 1519   ALBUMIN 4.6 06/25/2020 1716   ALBUMIN 4.5 12/13/2019 0709   ALBUMIN 3.9 10/10/2014 1519   AST 21 06/25/2020 1716   AST 12 (L) 10/10/2014 1519   ALT 30 06/25/2020 1716   ALT 38 10/10/2014 1519  ALKPHOS 61 06/25/2020 1716   ALKPHOS 93 10/10/2014 1519   BILITOT 0.7 06/25/2020 1716   BILITOT <0.2 12/13/2019 0709   BILITOT 0.3 10/10/2014 1519   GFRNONAA 56 (L) 10/14/2020 1636   GFRNONAA >60 10/10/2014 1519   GFRNONAA >60 05/19/2014 0503   GFRAA 65 10/14/2020 1636   GFRAA >60 10/10/2014 1519   GFRAA >60 05/19/2014 0503    Imaging  Studies: No results found.  Assessment and Plan:   TAMAYA PUN is a 55 y.o. y/o female has been referred for family history of colon cancer in her father and constipation.  The patient is not in need of a colonoscopy until 2023.  She is doing well on her laxative the present time. The patient is concerned because her mother had Barrett's esophagus.  She continues to have burning in her throat and is set up with ENT already.  She will increase her Protonix to twice a day and has been given a prescription for this.  She will also be set up for an upper endoscopy for a baseline after her bleach ingestion and she has been told after 10 years from the time of ingestion she will need an upper endoscopy every 2-3 years due to the risk of esophageal cancer.  The patient and her husband have explained the plan and agrees with it.  Follow Up Instructions:  I discussed the assessment and treatment plan with the patient. The patient was provided an opportunity to ask questions and all were answered. The patient agreed with the plan and demonstrated an understanding of the instructions.   The patient was advised to call back or seek an in-person evaluation if the symptoms worsen or if the condition fails to improve as anticipated.  I provided 20 minutes of non-face-to-face time during this encounter.   Midge Minium, MD  Speech recognition software was used to dictate the above note.

## 2020-10-29 ENCOUNTER — Other Ambulatory Visit: Payer: Self-pay

## 2020-10-29 DIAGNOSIS — K219 Gastro-esophageal reflux disease without esophagitis: Secondary | ICD-10-CM

## 2020-10-29 NOTE — Telephone Encounter (Signed)
Called pt again and scheduled EGD with Wohl at [facility] on 11/08/20. Instructions have been sent through mychart.

## 2020-11-04 ENCOUNTER — Encounter: Payer: Self-pay | Admitting: Gastroenterology

## 2020-11-04 ENCOUNTER — Other Ambulatory Visit: Payer: Self-pay

## 2020-11-06 ENCOUNTER — Other Ambulatory Visit
Admission: RE | Admit: 2020-11-06 | Discharge: 2020-11-06 | Disposition: A | Discharge status: Home / Self Care | Payer: Medicare Other | Source: Ambulatory Visit | Attending: Gastroenterology | Admitting: Gastroenterology

## 2020-11-06 ENCOUNTER — Other Ambulatory Visit: Payer: Self-pay

## 2020-11-06 DIAGNOSIS — Z20822 Contact with and (suspected) exposure to covid-19: Secondary | ICD-10-CM | POA: Diagnosis not present

## 2020-11-06 DIAGNOSIS — Z01812 Encounter for preprocedural laboratory examination: Secondary | ICD-10-CM | POA: Diagnosis not present

## 2020-11-06 LAB — SARS CORONAVIRUS 2 (TAT 6-24 HRS): SARS Coronavirus 2: NEGATIVE

## 2020-11-07 NOTE — Discharge Instructions (Signed)

## 2020-11-08 ENCOUNTER — Ambulatory Visit: Payer: Medicare Other | Admitting: Anesthesiology

## 2020-11-08 ENCOUNTER — Encounter: Payer: Self-pay | Admitting: Gastroenterology

## 2020-11-08 ENCOUNTER — Encounter
Admission: RE | Disposition: A | Discharge status: Home / Self Care | Payer: Self-pay | Source: Home / Self Care | Attending: Gastroenterology

## 2020-11-08 ENCOUNTER — Ambulatory Visit
Admission: RE | Admit: 2020-11-08 | Discharge: 2020-11-08 | Disposition: A | Discharge status: Home / Self Care | Payer: Medicare Other | Attending: Gastroenterology | Admitting: Gastroenterology

## 2020-11-08 ENCOUNTER — Other Ambulatory Visit: Payer: Self-pay

## 2020-11-08 DIAGNOSIS — K219 Gastro-esophageal reflux disease without esophagitis: Secondary | ICD-10-CM | POA: Diagnosis not present

## 2020-11-08 DIAGNOSIS — K2970 Gastritis, unspecified, without bleeding: Secondary | ICD-10-CM | POA: Diagnosis not present

## 2020-11-08 DIAGNOSIS — Z823 Family history of stroke: Secondary | ICD-10-CM | POA: Insufficient documentation

## 2020-11-08 DIAGNOSIS — Z800 Family history of malignant neoplasm of digestive organs: Secondary | ICD-10-CM | POA: Insufficient documentation

## 2020-11-08 DIAGNOSIS — K449 Diaphragmatic hernia without obstruction or gangrene: Secondary | ICD-10-CM | POA: Diagnosis not present

## 2020-11-08 DIAGNOSIS — R12 Heartburn: Secondary | ICD-10-CM | POA: Diagnosis not present

## 2020-11-08 DIAGNOSIS — Z803 Family history of malignant neoplasm of breast: Secondary | ICD-10-CM | POA: Diagnosis not present

## 2020-11-08 DIAGNOSIS — Z79899 Other long term (current) drug therapy: Secondary | ICD-10-CM | POA: Insufficient documentation

## 2020-11-08 DIAGNOSIS — I129 Hypertensive chronic kidney disease with stage 1 through stage 4 chronic kidney disease, or unspecified chronic kidney disease: Secondary | ICD-10-CM | POA: Insufficient documentation

## 2020-11-08 DIAGNOSIS — Z87891 Personal history of nicotine dependence: Secondary | ICD-10-CM | POA: Diagnosis not present

## 2020-11-08 DIAGNOSIS — T5494XD Toxic effect of unspecified corrosive substance, undetermined, subsequent encounter: Secondary | ICD-10-CM

## 2020-11-08 DIAGNOSIS — K5909 Other constipation: Secondary | ICD-10-CM | POA: Insufficient documentation

## 2020-11-08 DIAGNOSIS — Z8249 Family history of ischemic heart disease and other diseases of the circulatory system: Secondary | ICD-10-CM | POA: Diagnosis not present

## 2020-11-08 DIAGNOSIS — Z811 Family history of alcohol abuse and dependence: Secondary | ICD-10-CM | POA: Diagnosis not present

## 2020-11-08 DIAGNOSIS — Z8041 Family history of malignant neoplasm of ovary: Secondary | ICD-10-CM | POA: Insufficient documentation

## 2020-11-08 DIAGNOSIS — F319 Bipolar disorder, unspecified: Secondary | ICD-10-CM | POA: Insufficient documentation

## 2020-11-08 DIAGNOSIS — N183 Chronic kidney disease, stage 3 unspecified: Secondary | ICD-10-CM | POA: Insufficient documentation

## 2020-11-08 DIAGNOSIS — E1122 Type 2 diabetes mellitus with diabetic chronic kidney disease: Secondary | ICD-10-CM | POA: Diagnosis not present

## 2020-11-08 DIAGNOSIS — T5494XA Toxic effect of unspecified corrosive substance, undetermined, initial encounter: Secondary | ICD-10-CM

## 2020-11-08 DIAGNOSIS — K2950 Unspecified chronic gastritis without bleeding: Secondary | ICD-10-CM | POA: Diagnosis not present

## 2020-11-08 HISTORY — PX: ESOPHAGOGASTRODUODENOSCOPY (EGD) WITH PROPOFOL: SHX5813

## 2020-11-08 HISTORY — DX: Other complications of anesthesia, initial encounter: T88.59XA

## 2020-11-08 HISTORY — DX: Family history of other specified conditions: Z84.89

## 2020-11-08 SURGERY — ESOPHAGOGASTRODUODENOSCOPY (EGD) WITH PROPOFOL
Anesthesia: General

## 2020-11-08 MED ORDER — ACETAMINOPHEN 325 MG PO TABS: 325.0000 mg | ORAL_TABLET | Freq: Once | ORAL | Status: DC

## 2020-11-08 MED ORDER — PROPOFOL 10 MG/ML IV BOLUS
INTRAVENOUS | Status: DC | PRN
  Administered 2020-11-08: 180 mg via INTRAVENOUS
  Administered 2020-11-08: 30 mg via INTRAVENOUS

## 2020-11-08 MED ORDER — LACTATED RINGERS IV SOLN: INTRAVENOUS | Status: DC

## 2020-11-08 MED ORDER — LIDOCAINE HCL (CARDIAC) PF 100 MG/5ML IV SOSY
PREFILLED_SYRINGE | INTRAVENOUS | Status: DC | PRN
  Administered 2020-11-08: 50 mg via INTRAVENOUS

## 2020-11-08 MED ORDER — GLYCOPYRROLATE 0.2 MG/ML IJ SOLN
INTRAMUSCULAR | Status: DC | PRN
  Administered 2020-11-08: .1 mg via INTRAVENOUS

## 2020-11-08 MED ORDER — ACETAMINOPHEN 160 MG/5ML PO SOLN: 325.0000 mg | Freq: Once | ORAL | Status: DC

## 2020-11-08 MED ORDER — SODIUM CHLORIDE 0.9 % IV SOLN: INTRAVENOUS | Status: DC

## 2020-11-08 SURGICAL SUPPLY — 34 items
BALLN DILATOR 10-12 8 (BALLOONS)
BALLN DILATOR 12-15 8 (BALLOONS)
BALLN DILATOR 15-18 8 (BALLOONS)
BALLN DILATOR CRE 0-12 8 (BALLOONS)
BALLN DILATOR ESOPH 8 10 CRE (MISCELLANEOUS) IMPLANT
BALLOON DILATOR 12-15 8 (BALLOONS) IMPLANT
BALLOON DILATOR 15-18 8 (BALLOONS) IMPLANT
BALLOON DILATOR CRE 0-12 8 (BALLOONS) IMPLANT
BLOCK BITE 60FR ADLT L/F GRN (MISCELLANEOUS) ×2 IMPLANT
CLIP HMST 235XBRD CATH ROT (MISCELLANEOUS) IMPLANT
CLIP RESOLUTION 360 11X235 (MISCELLANEOUS)
ELECT REM PT RETURN 9FT ADLT (ELECTROSURGICAL)
ELECTRODE REM PT RTRN 9FT ADLT (ELECTROSURGICAL) IMPLANT
FCP ESCP3.2XJMB 240X2.8X (MISCELLANEOUS) ×1
FORCEPS BIOP RAD 4 LRG CAP 4 (CUTTING FORCEPS) IMPLANT
FORCEPS BIOP RJ4 240 W/NDL (MISCELLANEOUS) ×2
FORCEPS ESCP3.2XJMB 240X2.8X (MISCELLANEOUS) IMPLANT
GOWN CVR UNV OPN BCK APRN NK (MISCELLANEOUS) ×2 IMPLANT
GOWN ISOL THUMB LOOP REG UNIV (MISCELLANEOUS) ×4
INJECTOR VARIJECT VIN23 (MISCELLANEOUS) IMPLANT
KIT DEFENDO VALVE AND CONN (KITS) IMPLANT
KIT PRC NS LF DISP ENDO (KITS) ×1 IMPLANT
KIT PROCEDURE OLYMPUS (KITS) ×2
MANIFOLD NEPTUNE II (INSTRUMENTS) ×2 IMPLANT
MARKER SPOT ENDO TATTOO 5ML (MISCELLANEOUS) IMPLANT
RETRIEVER NET PLAT FOOD (MISCELLANEOUS) IMPLANT
SNARE SHORT THROW 13M SML OVAL (MISCELLANEOUS) IMPLANT
SNARE SHORT THROW 30M LRG OVAL (MISCELLANEOUS) IMPLANT
SPOT EX ENDOSCOPIC TATTOO (MISCELLANEOUS)
SYR INFLATION 60ML (SYRINGE) IMPLANT
TRAP ETRAP POLY (MISCELLANEOUS) IMPLANT
VARIJECT INJECTOR VIN23 (MISCELLANEOUS)
WATER STERILE IRR 250ML POUR (IV SOLUTION) ×2 IMPLANT
WIRE CRE 18-20MM 8CM F G (MISCELLANEOUS) IMPLANT

## 2020-11-08 NOTE — Op Note (Signed)
[facility] Gastroenterology Patient Name: Jennifer Bright Procedure Date: 11/08/2020 8:34 AM MRN: [id number] Account #: [id number] Date of Birth: 10-28-1965 Admit Type: Outpatient Age: 55 Room: [facility] OR ROOM 01 Gender: Female Note Status: Finalized Procedure:             Upper GI endoscopy Indications:           Heartburn, Follow-up of esophageal injury due to                         caustic ingestion Providers:             Midge Minium MD, MD Referring MD:          Bari Edward, MD (Referring MD) Medicines:             Propofol per Anesthesia Complications:         No immediate complications. Procedure:             Pre-Anesthesia Assessment:                        - Prior to the procedure, a History and Physical was                         performed, and patient medications and allergies were                         reviewed. The patient's tolerance of previous                         anesthesia was also reviewed. The risks and benefits                         of the procedure and the sedation options and risks                         were discussed with the patient. All questions were                         answered, and informed consent was obtained. Prior                         Anticoagulants: The patient has taken no previous                         anticoagulant or antiplatelet agents. ASA Grade                         Assessment: II - A patient with mild systemic disease.                         After reviewing the risks and benefits, the patient                         was deemed in satisfactory condition to undergo the                         procedure.  After obtaining informed consent, the endoscope was                         passed under direct vision. Throughout the procedure,                         the patient's blood pressure, pulse, and oxygen                         saturations were monitored continuously. The was                          introduced through the mouth, and advanced to the                         second part of duodenum. The upper GI endoscopy was                         accomplished without difficulty. The patient tolerated                         the procedure well. Findings:      A small hiatal hernia was present.      Localized mild inflammation characterized by erythema was found in the       gastric antrum. Biopsies were taken with a cold forceps for histology.      The examined duodenum was normal. Impression:            - Small hiatal hernia.                        - Gastritis. Biopsied.                        - Normal examined duodenum. Recommendation:        - Discharge patient to home.                        - Resume previous diet.                        - Continue present medications.                        - Await pathology results.                        - Repeat upper endoscopy in 3 years for surveillance. Procedure Code(s):     --- Professional ---                        [phone], Esophagogastroduodenoscopy, flexible,                         transoral; with biopsy, single or multiple Diagnosis Code(s):     --- Professional ---                        T54.94XD, Toxic effect of unspecified corrosive                         substance, undetermined,  subsequent encounter                        R12, Heartburn                        K29.70, Gastritis, unspecified, without bleeding CPT copyright 2019 American Medical Association. All rights reserved. The codes documented in this report are preliminary and upon coder review may  be revised to meet current compliance requirements. Midge Minium MD, MD 11/08/2020 8:50:47 AM This report has been signed electronically. Number of Addenda: 0 Note Initiated On: 11/08/2020 8:34 AM Total Procedure Duration: 0 hours 3 minutes 24 seconds  Estimated Blood Loss:  Estimated blood loss: none.      [facility]

## 2020-11-08 NOTE — Transfer of Care (Signed)
Immediate Anesthesia Transfer of Care Note  Patient: Jennifer Bright  Procedure(s) Performed: ESOPHAGOGASTRODUODENOSCOPY (EGD) WITH PROPOFOL w// biopsy (N/A )  Patient Location: PACU  Anesthesia Type: General  Level of Consciousness: awake, alert  and patient cooperative  Airway and Oxygen Therapy: Patient Spontanous Breathing and Patient connected to supplemental oxygen  Post-op Assessment: Post-op Vital signs reviewed, Patient's Cardiovascular Status Stable, Respiratory Function Stable, Patent Airway and No signs of Nausea or vomiting  Post-op Vital Signs: Reviewed and stable  Complications: No complications documented.

## 2020-11-08 NOTE — Interval H&P Note (Signed)
Midge Minium, MD [facility] [address], [address] [place], [place] [address] Phone:[phone] Fax : [phone]  Primary Care Physician:  Reubin Milan, MD Primary Gastroenterologist:  Dr. Servando Snare  Pre-Procedure History & Physical: HPI:  Jennifer Bright is a 55 y.o. female is here for an endoscopy.   Past Medical History:  Diagnosis Date   Acid reflux    Anemia    Anxiety    Bipolar 1 disorder, manic, moderate ([facility]) 09/19/2019   Bipolar depression ([facility]) 05/12/2015   Bipolar disorder ([facility])    Bipolar disorder ([facility]) 05/12/2015   Cataract    Chronic kidney disease    STAGE 3   Complication of anesthesia    Felt cataract procedures   Depression    Diabetes mellitus    NIDD   Essential tremor    Family history of adverse reaction to anesthesia    Mother - slow to wake   Family history of breast cancer    doesn't meet medicare guidelines for cancer genetic testing.    Family history of ovarian cancer    Pt doesn't meet Medicare genetic testing guidelines   Hyperlipidemia    Hypothyroid    IBS (irritable bowel syndrome)    Migraine headache    vestibular migraine   Obesity    Osteopenia    Restless leg syndrome    Sarcoidosis    Self-inflicted laceration of left wrist ([facility]) 09/19/2019   Sleep apnea    Syncope \    Past Surgical History:  Procedure Laterality Date   ABDOMINAL HYSTERECTOMY     APPENDECTOMY     BLADDER SURGERY     bladder tact   CATARACT EXTRACTION W/PHACO Right 09/21/2018   Procedure: CATARACT EXTRACTION PHACO AND INTRAOCULAR LENS PLACEMENT ([facility]);  Surgeon: [place] Crane, MD;  Location: [facility] ORS;  Service: Ophthalmology;  Laterality: Right;  [place]  00:20 CDE 00.82 Fluid pack lot # [id number]   CATARACT EXTRACTION W/PHACO Left 10/17/2018   Procedure: CATARACT EXTRACTION PHACO AND INTRAOCULAR LENS PLACEMENT ([facility])  LEFT;  Surgeon: [place] Crane, MD;  Location: [facility]ERY [facility];  Service: Ophthalmology;  Laterality: Left;  diabetic - diet  controlled   CESAREAN SECTION     CHOLECYSTECTOMY     COLONOSCOPY WITH PROPOFOL N/A 04/05/2017   Procedure: COLONOSCOPY WITH PROPOFOL;  Surgeon: Christena Deem, MD;  Location: [facility] ENDOSCOPY;  Service: Endoscopy;  Laterality: N/A;   ESOPHAGOGASTRODUODENOSCOPY (EGD) WITH PROPOFOL N/A 08/28/2016   Procedure: ESOPHAGOGASTRODUODENOSCOPY (EGD) WITH PROPOFOL;  Surgeon: Christena Deem, MD;  Location: [facility] ENDOSCOPY;  Service: Endoscopy;  Laterality: N/A;   HERNIA REPAIR     INGUINAL HERNIA REPAIR     rt and left   lexiscan cardiolite     TILT TABLE STUDY N/A 04/01/2012   Procedure: TILT TABLE STUDY;  Surgeon: Duke Salvia, MD;  Location: [facility] CATH LAB;  Service: Cardiovascular;  Laterality: N/A;    Prior to Admission medications   Medication Sig Start Date End Date Taking? Authorizing Provider  ALPRAZolam (XANAX XR) 1 MG 24 hr tablet Take 1-3 mg by mouth daily. Dr. Evelene Croon   Yes [provider]  ALPRAZolam Prudy Feeler) 1 MG tablet Take 1 mg by mouth at bedtime as needed for anxiety. Dr. Evelene Croon   Yes [provider]  amphetamine-dextroamphetamine (ADDERALL XR) 30 MG 24 hr capsule Take 30 mg by mouth 2 (two) times daily.   Yes [provider]  atorvastatin (LIPITOR) 40 MG tablet Take 40 mg by mouth at  bedtime. Prescribed by PCP   Yes [provider]  benztropine (COGENTIN) 1 MG tablet Take 1 mg by mouth 2 (two) times daily. Dr. Evelene Croon   Yes [provider]  Cholecalciferol (VITAMIN D3) 50 MCG (2000 UT) capsule Take 2,000 Units by mouth daily.   Yes [provider]  Docusate Sodium (DSS) 100 MG CAPS Take 100 mg by mouth daily.    Yes [provider]  DULoxetine (CYMBALTA) 60 MG capsule Take 60 mg by mouth daily. Dr. Evelene Croon 07/29/20  Yes [provider]  furosemide (LASIX) 20 MG tablet Take 20 mg by mouth daily as needed. Prescribed by PCP 04/10/20  Yes [provider]  levothyroxine (SYNTHROID) 100 MCG tablet TAKE 1 TABLET EVERY  DAY ON EMPTY STOMACHWITH A GLASS OF WATER AT LEAST 30-60 MINBEFORE BREAKFAST Patient taking differently: Take 100 mcg by mouth daily before breakfast. 04/10/20  Yes Lyndon Code, MD  linaclotide [facility]) 290 MCG CAPS capsule Take 1 capsule (290 mcg total) by mouth daily before breakfast. Patient taking differently: Take 290 mcg by mouth daily as needed. 08/14/20  Yes Reubin Milan, MD  Melatonin 10 MG TABS Take 10 mg by mouth at bedtime.   Yes [provider]  Multiple Vitamin (MULTIVITAMIN) capsule Take by mouth.   Yes [provider]  Omega-3 300 MG CAPS Take 1.2 g by mouth daily.    Yes [provider]  pantoprazole (PROTONIX) 40 MG tablet TAKE ONE TABLET EVERY DAY 15-20 MINUTES BEFORE A MEAL 10/07/20  Yes Reubin Milan, MD  pantoprazole (PROTONIX) 40 MG tablet Take 1 tablet (40 mg total) by mouth 2 (two) times daily. 10/21/20  Yes Midge Minium, MD  primidone (MYSOLINE) 50 MG tablet Take 50 mg by mouth in the morning and at bedtime. 50 mg in AM 100 mg in PM Prescribed by PCP   Yes [provider]  rOPINIRole (REQUIP) 1 MG tablet Take 1.5 mg by mouth at bedtime. Dr. Meredeth Ide 07/16/20  Yes [provider]  topiramate (TOPAMAX) 200 MG tablet TAKE 1 TABLET BY MOUTH IN  THE MORNING AND 1 TABLET BY MOUTH IN THE EVENING 04/02/20  Yes Lyndon Code, MD  traZODone (DESYREL) 100 MG tablet Take 150-200 mg by mouth at bedtime. Dr Evelene Croon 08/05/20  Yes [provider]  ziprasidone (GEODON) 60 MG capsule Take 120 mg by mouth every evening. Dr. Evelene Croon 04/06/19  Yes [provider]    Allergies as of 10/29/2020 - Review Complete 10/14/2020  Allergen Reaction Noted   Cleocin [clindamycin hcl] Other (See Comments) 07/29/2015    Family History  Problem Relation Age of Onset   Colon cancer Father    Stomach cancer Father    Hypertension Mother    Hyperlipidemia Mother    Stroke Mother    Alcohol abuse Brother    Irritable bowel syndrome  Sister    Breast cancer Paternal Aunt    Ovarian cancer Paternal Aunt    Cancer Cousin    Cancer Cousin    Ovarian cancer Cousin    Kidney cancer Neg Hx    Kidney disease Neg Hx    Prostate cancer Neg Hx     Social History   Socioeconomic History   Marital status: Married    Spouse name: Not on file   Number of children: Not on file   Years of education: Not on file   Highest education level: Not on file  Occupational History   Occupation: disabled  Tobacco Use   Smoking status: Former Smoker    Types: Cigarettes    Quit date: 2018    Years since quitting: 4.0   Smokeless tobacco: Never Used  Building services engineer Use: Never used  Substance and Sexual Activity   Alcohol use: No   Drug use: No   Sexual activity: Yes  Other Topics Concern   Not on file  Social History Narrative   Not on file   Social Determinants of Health   Financial Resource Strain: Not on file  Food Insecurity: Not on file  Transportation Needs: Not on file  Physical Activity: Not on file  Stress: Not on file  Social Connections: Not on file  Intimate Partner Violence: Not on file    Review of Systems: See HPI, otherwise negative ROS  Physical Exam: BP (!) 84/53    Pulse 76    Temp 98 F (36.7 C)    Resp 16    Ht 5\' 4"  (1.626 m)    Wt 73.5 kg    SpO2 96%    BMI 27.81 kg/m  General:   Alert,  pleasant and cooperative in NAD Head:  Normocephalic and atraumatic. Neck:  Supple; no masses or thyromegaly. Lungs:  Clear throughout to auscultation.    Heart:  Regular rate and rhythm. Abdomen:  Soft, nontender and nondistended. Normal bowel sounds, without guarding, and without rebound.   Neurologic:  Alert and  oriented x4;  grossly normal neurologically.  Impression/Plan: Jennifer Bright is here for an endoscopy to be performed for GERD history of caustic injection   Risks, benefits, limitations, and alternatives regarding  endoscopy have been reviewed with the patient.  Questions have  been answered.  All parties agreeable.   [name], MD  11/08/2020, 7:41 AM

## 2020-11-08 NOTE — Anesthesia Postprocedure Evaluation (Signed)
Anesthesia Post Note  Patient: Jennifer Bright  Procedure(s) Performed: ESOPHAGOGASTRODUODENOSCOPY (EGD) WITH PROPOFOL w// biopsy (N/A )     Patient location during evaluation: PACU Anesthesia Type: General Level of consciousness: awake and alert and oriented Pain management: satisfactory to patient Vital Signs Assessment: post-procedure vital signs reviewed and stable Respiratory status: spontaneous breathing, nonlabored ventilation and respiratory function stable Cardiovascular status: blood pressure returned to baseline and stable Postop Assessment: Adequate PO intake and No signs of nausea or vomiting Anesthetic complications: no   No complications documented.  Cherly Beach

## 2020-11-08 NOTE — Anesthesia Preprocedure Evaluation (Signed)
Anesthesia Evaluation  Patient identified by MRN, date of birth, ID band Patient awake    Reviewed: Allergy & Precautions, H&P , NPO status , Patient's Chart, lab work & pertinent test results  Airway Mallampati: I  TM Distance: >3 FB Neck ROM: full    Dental no notable dental hx. (+) Missing   Pulmonary sleep apnea , former smoker,    Pulmonary exam normal breath sounds clear to auscultation       Cardiovascular Normal cardiovascular exam Rhythm:regular Rate:Normal     Neuro/Psych PSYCHIATRIC DISORDERS    GI/Hepatic GERD  ,  Endo/Other  diabetesHypothyroidism   Renal/GU Renal disease     Musculoskeletal   Abdominal   Peds  Hematology   Anesthesia Other Findings   Reproductive/Obstetrics                             Anesthesia Physical Anesthesia Plan  ASA: III  Anesthesia Plan: General   Post-op Pain Management:    Induction: Intravenous  PONV Risk Score and Plan: 3 and Treatment may vary due to age or medical condition, TIVA and Propofol infusion  Airway Management Planned: Natural Airway  Additional Equipment:   Intra-op Plan:   Post-operative Plan:   Informed Consent: I have reviewed the patients History and Physical, chart, labs and discussed the procedure including the risks, benefits and alternatives for the proposed anesthesia with the patient or authorized representative who has indicated his/her understanding and acceptance.     Dental Advisory Given  Plan Discussed with: CRNA  Anesthesia Plan Comments:         Anesthesia Quick Evaluation

## 2020-11-08 NOTE — Anesthesia Procedure Notes (Signed)
Date/Time: 11/08/2020 8:41 AM Performed by: Maree Krabbe, CRNA Pre-anesthesia Checklist: Patient identified, Emergency Drugs available, Suction available, Timeout performed and Patient being monitored Patient Re-evaluated:Patient Re-evaluated prior to induction Oxygen Delivery Method: Nasal cannula Placement Confirmation: positive ETCO2

## 2020-11-11 ENCOUNTER — Encounter: Payer: Self-pay | Admitting: Gastroenterology

## 2020-11-11 LAB — SURGICAL PATHOLOGY

## 2020-11-12 ENCOUNTER — Encounter: Payer: Self-pay | Admitting: Gastroenterology

## 2020-11-14 DIAGNOSIS — Z1231 Encounter for screening mammogram for malignant neoplasm of breast: Secondary | ICD-10-CM | POA: Diagnosis not present

## 2020-11-25 DIAGNOSIS — E1165 Type 2 diabetes mellitus with hyperglycemia: Secondary | ICD-10-CM | POA: Diagnosis not present

## 2020-11-25 DIAGNOSIS — D869 Sarcoidosis, unspecified: Secondary | ICD-10-CM | POA: Diagnosis not present

## 2020-11-25 DIAGNOSIS — R809 Proteinuria, unspecified: Secondary | ICD-10-CM | POA: Diagnosis not present

## 2020-11-25 DIAGNOSIS — I129 Hypertensive chronic kidney disease with stage 1 through stage 4 chronic kidney disease, or unspecified chronic kidney disease: Secondary | ICD-10-CM | POA: Diagnosis not present

## 2020-11-25 DIAGNOSIS — N1831 Chronic kidney disease, stage 3a: Secondary | ICD-10-CM | POA: Diagnosis not present

## 2020-11-25 DIAGNOSIS — E1121 Type 2 diabetes mellitus with diabetic nephropathy: Secondary | ICD-10-CM | POA: Diagnosis not present

## 2020-12-02 ENCOUNTER — Encounter: Payer: Self-pay | Admitting: Internal Medicine

## 2020-12-02 ENCOUNTER — Ambulatory Visit: Payer: Medicare Other | Admitting: Internal Medicine

## 2020-12-02 NOTE — Progress Notes (Deleted)
Date:  12/02/2020   Name:  Jennifer Bright   DOB:  10-Nov-1965   MRN:  [id number]   Chief Complaint: No chief complaint on file.  Back Pain   11/2019: C spine: IMPRESSION: Mild degenerative disc disease is noted at C5-6 with mild bilateral neural foraminal stenosis at this level secondary to uncovertebral spurring. No acute abnormality is noted. Thoracic spine: FINDINGS: There is no evidence of thoracic spine fracture. Alignment is normal. No other significant bone abnormalities are identified. IMPRESSION: Negative.  Lab Results  Component Value Date   CREATININE 1.12 (H) 10/14/2020   BUN 17 10/14/2020   NA 140 10/14/2020   K 3.7 10/14/2020   CL 103 10/14/2020   CO2 21 10/14/2020   Lab Results  Component Value Date   CHOL 197 12/13/2019   HDL 50 12/13/2019   LDLCALC 111 (H) 12/13/2019   TRIG 207 (H) 12/13/2019   Lab Results  Component Value Date   TSH 2.190 10/14/2020   Lab Results  Component Value Date   HGBA1C 5.9 (H) 10/14/2020   Lab Results  Component Value Date   WBC 6.4 06/25/2020   HGB 14.4 06/25/2020   HCT 41.9 06/25/2020   MCV 88.0 06/25/2020   PLT 213 06/25/2020   Lab Results  Component Value Date   ALT 30 06/25/2020   AST 21 06/25/2020   ALKPHOS 61 06/25/2020   BILITOT 0.7 06/25/2020     Review of Systems  Musculoskeletal: Positive for back pain.    Patient Active Problem List   Diagnosis Date Noted  . Injury by caustic substances, except poisoning, undetermined whether accidentally or purposely inflicted   . Gastroesophageal reflux disease   . Gastritis without bleeding   . S/P total abdominal hysterectomy and bilateral salpingo-oophorectomy 08/14/2020  . Chronic idiopathic constipation 08/14/2020  . Bleach ingestion 06/27/2020  . Depression, major, recurrent, severe with psychosis (HCC) 06/26/2020  . Seizure-like activity (HCC) 06/04/2020  . Urinary incontinence without sensory awareness 05/21/2020  . Bipolar 2 disorder,  major depressive episode (HCC) 09/19/2019  . Stage 3a chronic kidney disease (HCC) 07/10/2019  . Proteinuria 07/10/2019  . Myalgia 12/30/2017  . Vaginal atrophy 07/12/2017  . Polypharmacy 08/14/2016  . 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 Hypercalcemia 07/24/2016  . Hyperlipidemia, unspecified 11/15/2015  . Prediabetes 11/15/2015  . Anxiety disorder 07/29/2015  . Benign essential tremor 05/12/2015  . GERD with esophagitis 05/12/2015  . Hypersomnia with sleep apnea 05/12/2015  . Restless leg 05/12/2015  . Acquired hypothyroidism 05/12/2015  . Chronic migraine without aura 09/03/2012  . 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 Cleocin [Clindamycin Hcl] Other (See Comments)    GI distress    Past Surgical History:  Procedure Laterality Date  . ABDOMINAL HYSTERECTOMY    . APPENDECTOMY    . BLADDER SURGERY     bladder tact  . CATARACT EXTRACTION W/PHACO Right 09/21/2018   Procedure: CATARACT EXTRACTION PHACO AND INTRAOCULAR LENS PLACEMENT (IOC);  Surgeon: Nevada Crane, MD;  Location: [facility] ORS;  Service: Ophthalmology;  Laterality: Right;  [place]  00:20 CDE 00.82 Fluid pack lot # [id number]  . CATARACT EXTRACTION W/PHACO Left 10/17/2018   Procedure: CATARACT EXTRACTION PHACO AND INTRAOCULAR LENS PLACEMENT (IOC)  LEFT;  Surgeon: Nevada Crane, MD;  Location: [facility];  Service: Ophthalmology;  Laterality: Left;  diabetic - diet controlled  . CESAREAN SECTION    . CHOLECYSTECTOMY    . COLONOSCOPY WITH PROPOFOL  N/A 04/05/2017   Procedure: COLONOSCOPY WITH PROPOFOL;  Surgeon: Christena Deem, MD;  Location: [facility] ENDOSCOPY;  Service: Endoscopy;  Laterality: N/A;  . ESOPHAGOGASTRODUODENOSCOPY (EGD) WITH PROPOFOL N/A 08/28/2016   Procedure: ESOPHAGOGASTRODUODENOSCOPY (EGD) WITH PROPOFOL;  Surgeon: Christena Deem, MD;  Location: [facility] ENDOSCOPY;  Service: Endoscopy;  Laterality: N/A;  . ESOPHAGOGASTRODUODENOSCOPY (EGD) WITH  PROPOFOL N/A 11/08/2020   Procedure: ESOPHAGOGASTRODUODENOSCOPY (EGD) WITH PROPOFOL w// biopsy;  Surgeon: Midge Minium, MD;  Location: [facility];  Service: Endoscopy;  Laterality: N/A;  Pre-Diabetic  . HERNIA REPAIR    . INGUINAL HERNIA REPAIR     rt and left  . lexiscan cardiolite    . TILT TABLE STUDY N/A 04/01/2012   Procedure: TILT TABLE STUDY;  Surgeon: Duke Salvia, MD;  Location: [facility] CATH LAB;  Service: Cardiovascular;  Laterality: N/A;    Social History   Tobacco Use  . Smoking status: Former Smoker    Types: Cigarettes    Quit date: 2018    Years since quitting: 4.1  . Smokeless tobacco: Never Used  Vaping Use  . Vaping Use: Never used  Substance Use Topics  . Alcohol use: No  . Drug use: No     Medication list has been reviewed and updated.  No outpatient medications have been marked as taking for the 12/02/20 encounter (Appointment) with Reubin Milan, MD.    [facility] 2/9 Scores 10/14/2020 08/14/2020 04/24/2020 02/20/2020  PHQ - 2 Score 4 4 0 6  PHQ- 9 Score 19 21 - 19  Exception Documentation - - Medical reason -  Some encounter information is confidential and restricted. Go to Review Flowsheets activity to see all data.    GAD 7 : Generalized Anxiety Score 10/14/2020 08/14/2020  Nervous, Anxious, on Edge 3 2  Control/stop worrying 3 3  Worry too much - different things 3 3  Trouble relaxing 3 3  Restless 2 2  Easily annoyed or irritable 3 2  Afraid - awful might happen 3 2  Total GAD 7 Score 20 17  Anxiety Difficulty Very difficult Somewhat difficult    BP Readings from Last 3 Encounters:  11/08/20 96/68  10/14/20 122/62  08/14/20 102/72    Physical Exam  Wt Readings from Last 3 Encounters:  11/08/20 162 lb (73.5 kg)  10/14/20 162 lb (73.5 kg)  08/14/20 165 lb (74.8 kg)    There were no vitals taken for this visit.  Assessment and Plan:

## 2020-12-05 DIAGNOSIS — G4700 Insomnia, unspecified: Secondary | ICD-10-CM | POA: Diagnosis not present

## 2020-12-05 DIAGNOSIS — R413 Other amnesia: Secondary | ICD-10-CM | POA: Diagnosis not present

## 2020-12-05 DIAGNOSIS — G2581 Restless legs syndrome: Secondary | ICD-10-CM | POA: Diagnosis not present

## 2020-12-05 DIAGNOSIS — M5412 Radiculopathy, cervical region: Secondary | ICD-10-CM | POA: Diagnosis not present

## 2020-12-05 DIAGNOSIS — R4020 Unspecified coma: Secondary | ICD-10-CM | POA: Diagnosis not present

## 2020-12-05 DIAGNOSIS — G43809 Other migraine, not intractable, without status migrainosus: Secondary | ICD-10-CM | POA: Diagnosis not present

## 2020-12-05 DIAGNOSIS — R569 Unspecified convulsions: Secondary | ICD-10-CM | POA: Diagnosis not present

## 2020-12-05 DIAGNOSIS — M542 Cervicalgia: Secondary | ICD-10-CM | POA: Diagnosis not present

## 2020-12-10 ENCOUNTER — Telehealth: Payer: Self-pay

## 2020-12-10 NOTE — Telephone Encounter (Unsigned)
Copied from CRM [id number]. Topic: General - Other >> Dec 10, 2020  4:42 PM Pawlus, Maxine Glenn A wrote: Reason for CRM: Verdie Drown from home health [phone] wanted to report abnormal results. Please CB.

## 2020-12-11 NOTE — Telephone Encounter (Signed)
Called Mary left VM to call back.  Will route result note to [facility] Nurse Triage for follow up when patient returns call to clinic. Nurse may give results to patient if they return call. CRM created for this message.   KP

## 2020-12-11 NOTE — Telephone Encounter (Signed)
Spoke with Jennifer Bright. She wanted [place] to be aware that. Pt has slight mild peripheral vascular disease on right side. Right foot-0.83 and left foot normal.  KP

## 2020-12-11 NOTE — Telephone Encounter (Signed)
Jennifer Bright called back and wanted to speak with Mariann Barter. Please advise.

## 2020-12-17 ENCOUNTER — Other Ambulatory Visit: Payer: Self-pay

## 2020-12-17 ENCOUNTER — Encounter: Payer: Self-pay | Admitting: Internal Medicine

## 2020-12-17 ENCOUNTER — Ambulatory Visit (INDEPENDENT_AMBULATORY_CARE_PROVIDER_SITE_OTHER): Payer: Medicare Other | Admitting: Internal Medicine

## 2020-12-17 VITALS — BP 104/62 | HR 84 | Ht 64.0 in | Wt 162.0 lb

## 2020-12-17 DIAGNOSIS — M533 Sacrococcygeal disorders, not elsewhere classified: Secondary | ICD-10-CM

## 2020-12-17 DIAGNOSIS — E2839 Other primary ovarian failure: Secondary | ICD-10-CM

## 2020-12-17 NOTE — Progress Notes (Signed)
Date:  12/17/2020   Name:  Jennifer Bright   DOB:  02/07/1966   MRN:  [id number]   Chief Complaint: Flank Pain (Patient said she has full body Rt sided pain. Been there for "months." Says it feels like someone is ripping something out of her. On her lower right back it hurts severely. Causes her to have to get up out of the bed. If she sits for longer than 30 mins the pain comes on. Seen Neurology - she is scheduled for a cervical MRI and brain MRI. Has not had this done yet. )  Back Pain This is a chronic problem. The current episode started more than 1 year ago. The problem occurs constantly. The problem is unchanged. The quality of the pain is described as burning and aching. The pain is mild. The symptoms are aggravated by standing and twisting. Associated symptoms include abdominal pain, headaches, numbness and weakness. Pertinent negatives include no chest pain.   OP - last DEXA in 2017 showing osteopenia.  Due for follow up.  Lab Results  Component Value Date   CREATININE 1.12 (H) 10/14/2020   BUN 17 10/14/2020   NA 140 10/14/2020   K 3.7 10/14/2020   CL 103 10/14/2020   CO2 21 10/14/2020   Lab Results  Component Value Date   CHOL 197 12/13/2019   HDL 50 12/13/2019   LDLCALC 111 (H) 12/13/2019   TRIG 207 (H) 12/13/2019   Lab Results  Component Value Date   TSH 2.190 10/14/2020   Lab Results  Component Value Date   HGBA1C 5.9 (H) 10/14/2020   Lab Results  Component Value Date   WBC 6.4 06/25/2020   HGB 14.4 06/25/2020   HCT 41.9 06/25/2020   MCV 88.0 06/25/2020   PLT 213 06/25/2020   Lab Results  Component Value Date   ALT 30 06/25/2020   AST 21 06/25/2020   ALKPHOS 61 06/25/2020   BILITOT 0.7 06/25/2020     Review of Systems  Constitutional: Positive for fatigue. Negative for chills.  Respiratory: Negative for cough and shortness of breath.   Cardiovascular: Negative for chest pain and leg swelling.  Gastrointestinal: Positive for abdominal pain and  constipation.  Musculoskeletal: Positive for back pain. Negative for joint swelling.  Neurological: Positive for tremors, weakness, numbness and headaches.  Psychiatric/Behavioral: Positive for sleep disturbance. The patient is nervous/anxious.     Patient Active Problem List   Diagnosis Date Noted  . Gastritis without bleeding   . S/P total abdominal hysterectomy and bilateral salpingo-oophorectomy 08/14/2020  . Chronic idiopathic constipation 08/14/2020  . Depression, major, recurrent, severe with psychosis (HCC) 06/26/2020  . Seizure-like activity (HCC) 06/04/2020  . Urinary incontinence without sensory awareness 05/21/2020  . Bipolar 2 disorder, major depressive episode (HCC) 09/19/2019  . Stage 3a chronic kidney disease (HCC) 07/10/2019  . Proteinuria 07/10/2019  . Myalgia 12/30/2017  . Vaginal atrophy 07/12/2017  . Polypharmacy 08/14/2016  . 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 Hypercalcemia 07/24/2016  . Hyperlipidemia, unspecified 11/15/2015  . Prediabetes 11/15/2015  . Anxiety disorder 07/29/2015  . Benign essential tremor 05/12/2015  . GERD with esophagitis 05/12/2015  . Hypersomnia with sleep apnea 05/12/2015  . Restless leg 05/12/2015  . Acquired hypothyroidism 05/12/2015  . Chronic migraine without aura 09/03/2012  . 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 Cleocin [Clindamycin Hcl] Other (See Comments)    GI distress    Past Surgical History:  Procedure Laterality  Date  . ABDOMINAL HYSTERECTOMY    . APPENDECTOMY    . BLADDER SURGERY     bladder tact  . CATARACT EXTRACTION W/PHACO Right 09/21/2018   Procedure: CATARACT EXTRACTION PHACO AND INTRAOCULAR LENS PLACEMENT (IOC);  Surgeon: Nevada Crane, MD;  Location: [facility] ORS;  Service: Ophthalmology;  Laterality: Right;  [place]  00:20 CDE 00.82 Fluid pack lot # [id number]  . CATARACT EXTRACTION W/PHACO Left 10/17/2018   Procedure: CATARACT EXTRACTION PHACO AND INTRAOCULAR  LENS PLACEMENT (IOC)  LEFT;  Surgeon: Nevada Crane, MD;  Location: [facility];  Service: Ophthalmology;  Laterality: Left;  diabetic - diet controlled  . CESAREAN SECTION    . CHOLECYSTECTOMY    . COLONOSCOPY WITH PROPOFOL N/A 04/05/2017   Procedure: COLONOSCOPY WITH PROPOFOL;  Surgeon: Christena Deem, MD;  Location[facility] END[facility];  Service: Endoscopy;  Laterality: N/A;  . ESOPHAGOGASTRODUODENOSCOPY (EGD) WITH PROPOFOL N/A 08/28/2016   Procedure: ESOPHAGOGASTRODUODENOSCOPY (EGD) WITH PROPOFOL;  Surgeon: Christena Deem, MD;  Location: [facility];  Service: Endoscopy;  Laterality: N/A;  . ESOPHAGOGASTRODUODENOSCOPY (EGD) WITH PROPOFOL N/A 11/08/2020   Procedure: ESOPHAGOGASTRODUODENOSCOPY (EGD) WITH PROPOFOL w// biopsy;  Surgeon: Midge Minium, MD;  Location: [facility];  Service: Endoscopy;  Laterality: N/A;  Pre-Diabetic  . HERNIA REPAIR    . INGUINAL HERNIA REPAIR     rt and left  . lexiscan cardiolite    . TILT TABLE STUDY N/A 04/01/2012   Procedure: TILT TABLE STUDY;  Surgeon: Duke Salvia, MD;  Location: [facility] CATH LAB;  Service: Cardiovascular;  Laterality: N/A;    Social History   Tobacco Use  . Smoking status: Former Smoker    Types: Cigarettes    Quit date: 2018    Years since quitting: 4.1  . Smokeless tobacco: Never Used  Vaping Use  . Vaping Use: Never used  Substance Use Topics  . Alcohol use: No  . Drug use: No     Medication list has been reviewed and updated.  Current Meds  Medication Sig  . ALPRAZolam (XANAX XR) 1 MG 24 hr tablet Take 1-3 mg by mouth daily. Dr. Evelene Croon  . ALPRAZolam (XANAX) 1 MG tablet Take 1 mg by mouth at bedtime as needed for anxiety. Dr. Evelene Croon  . amphetamine-dextroamphetamine (ADDERALL XR) 30 MG 24 hr capsule Take 30 mg by mouth 2 (two) times daily.  Marland Kitchen atorvastatin (LIPITOR) 40 MG tablet Take 40 mg by mouth at bedtime. Prescribed by PCP  . benztropine (COGENTIN) 1 MG tablet Take 1 mg by mouth 2 (two) times daily.  Dr. Evelene Croon  . Cholecalciferol (VITAMIN D3) 50 MCG (2000 UT) capsule Take 2,000 Units by mouth daily.  [name] 100 MG CAPS Take 100 mg by mouth daily.   . DULoxetine (CYMBALTA) 60 MG capsule Take 60 mg by mouth daily. Dr. Evelene Croon  . furosemide (LASIX) 20 MG tablet Take 20 mg by mouth daily as needed. Prescribed by PCP  . gabapentin (NEURONTIN) 300 MG capsule Take 1 capsule by mouth in the morning and at bedtime.  Marland Kitchen levothyroxine (SYNTHROID) 100 MCG tablet TAKE 1 TABLET EVERY DAY ON EMPTY STOMACHWITH A GLASS OF WATER AT LEAST 30-60 MINBEFORE BREAKFAST (Patient taking differently: Take 100 mcg by mouth daily before breakfast.)  . linaclotide (LINZESS) 290 MCG CAPS capsule Take 1 capsule (290 mcg total) by mouth daily before breakfast. (Patient taking differently: Take 290 mcg by mouth daily as needed.)  . Melatonin 10 MG TABS Take 10 mg by mouth  at bedtime.  . Multiple Vitamin (MULTIVITAMIN) capsule Take by mouth.  . Omega-3 300 MG CAPS Take 1.2 g by mouth daily.   . pantoprazole (PROTONIX) 40 MG tablet Take 1 tablet (40 mg total) by mouth 2 (two) times daily.  . primidone (MYSOLINE) 50 MG tablet Take 50 mg by mouth in the morning and at bedtime. 50 mg in AM 100 mg in PM Prescribed by PCP  . rOPINIRole (REQUIP) 1 MG tablet Take 1.5 mg by mouth at bedtime. Dr. Meredeth Ide  . topiramate (TOPAMAX) 200 MG tablet TAKE 1 TABLET BY MOUTH IN  THE MORNING AND 1 TABLET BY MOUTH IN THE EVENING  . traZODone (DESYREL) 100 MG tablet Take 150-200 mg by mouth at bedtime. Dr Evelene Croon  . ziprasidone (GEODON) 60 MG capsule Take 120 mg by mouth every evening. Dr. Evelene Croon  . [DISCONTINUED] pantoprazole (PROTONIX) 40 MG tablet TAKE ONE TABLET EVERY DAY 15-20 MINUTES BEFORE A MEAL    PHQ 2/9 Scores 12/17/2020 10/14/2020 08/14/2020 04/24/2020  PHQ - 2 Score 6 4 4  0  PHQ- 9 Score 16 19 21  -  Exception Documentation - - - Medical reason  Some encounter information is confidential and restricted. Go to Review Flowsheets  activity to see all data.    GAD 7 : Generalized Anxiety Score 12/17/2020 10/14/2020 08/14/2020  Nervous, Anxious, on Edge 2 3 2   Control/stop worrying 2 3 3   Worry too much - different things 2 3 3   Trouble relaxing 3 3 3   Restless 0 2 2  Easily annoyed or irritable 2 3 2   Afraid - awful might happen 0 3 2  Total GAD 7 Score 11 20 17   Anxiety Difficulty Somewhat difficult Very difficult Somewhat difficult    BP Readings from Last 3 Encounters:  12/17/20 104/62  11/08/20 96/68  10/14/20 122/62    Physical Exam Constitutional:      Comments: Sitting in wheelchair  Cardiovascular:     Rate and Rhythm: Normal rate and regular rhythm.  Pulmonary:     Effort: Pulmonary effort is normal.     Breath sounds: No wheezing or rhonchi.  Musculoskeletal:     Lumbar back: Positive right straight leg raise test (but appears to be weakness and mild discomfort) and positive left straight leg raise test (mild discomfort in low back).     Comments: Tender over right SI region - no palpable spasm  Neurological:     Mental Status: She is alert.  Psychiatric:        Attention and Perception: Attention normal.        Mood and Affect: Mood normal.     Wt Readings from Last 3 Encounters:  12/17/20 162 lb (73.5 kg)  11/08/20 162 lb (73.5 kg)  10/14/20 162 lb (73.5 kg)    BP 104/62   Pulse 84   Ht 5\' 4"  (1.626 m)   Wt 162 lb (73.5 kg)   SpO2 97%   BMI 27.81 kg/m   Assessment and Plan: 1. Sacro-iliac pain Likely unrelated to issues with neck and UE Recommend heat or ice several times per day Ibuprofen 400 mg tid Avoid straining and twisting  2. Ovarian failure Due for follow up DEXA - DG Bone Density; Future   Partially dictated using . Any errors are unintentional.  02/16/21, MD [facility]  12/17/2020

## 2021-01-03 ENCOUNTER — Telehealth: Payer: Self-pay

## 2021-01-03 NOTE — Telephone Encounter (Addendum)
Spoke w/patient. Advised RPH is out of the office until Monday. She also wanted Dr. Tiburcio Pea to know she had been treated for Venereal warts previously and hasn't had any problems, but occasionally she gets a white bump come up that hurts really bad and lasts about 3 weeks. Advised to call for apt for eval when this problem arises again.

## 2021-01-03 NOTE — Telephone Encounter (Signed)
Patient had a mammogram in January. She received results from [facility], but did not here from Dr. Tiburcio Pea. She just wanted to verify it was ok. She has scheduled her annual for November. She is inquiring if she will get a pap smear since she had a complete hysterectomy. Cb#[phone]

## 2021-01-06 DIAGNOSIS — M4802 Spinal stenosis, cervical region: Secondary | ICD-10-CM | POA: Diagnosis not present

## 2021-01-06 DIAGNOSIS — R519 Headache, unspecified: Secondary | ICD-10-CM | POA: Diagnosis not present

## 2021-01-06 DIAGNOSIS — G959 Disease of spinal cord, unspecified: Secondary | ICD-10-CM | POA: Diagnosis not present

## 2021-01-06 DIAGNOSIS — M542 Cervicalgia: Secondary | ICD-10-CM | POA: Diagnosis not present

## 2021-01-06 DIAGNOSIS — R569 Unspecified convulsions: Secondary | ICD-10-CM | POA: Diagnosis not present

## 2021-01-06 DIAGNOSIS — R413 Other amnesia: Secondary | ICD-10-CM | POA: Diagnosis not present

## 2021-01-06 DIAGNOSIS — M50122 Cervical disc disorder at C5-C6 level with radiculopathy: Secondary | ICD-10-CM | POA: Diagnosis not present

## 2021-01-06 DIAGNOSIS — R42 Dizziness and giddiness: Secondary | ICD-10-CM | POA: Diagnosis not present

## 2021-01-06 DIAGNOSIS — M5412 Radiculopathy, cervical region: Secondary | ICD-10-CM | POA: Diagnosis not present

## 2021-01-06 DIAGNOSIS — M4722 Other spondylosis with radiculopathy, cervical region: Secondary | ICD-10-CM | POA: Diagnosis not present

## 2021-01-06 DIAGNOSIS — R4020 Unspecified coma: Secondary | ICD-10-CM | POA: Diagnosis not present

## 2021-01-06 NOTE — Telephone Encounter (Signed)
Response in MyChart sent to pt.

## 2021-01-07 ENCOUNTER — Ambulatory Visit: Payer: Self-pay

## 2021-01-07 NOTE — Telephone Encounter (Signed)
Noted   Appt tomorrow  KP

## 2021-01-07 NOTE — Telephone Encounter (Signed)
Pt. Reports she fell at home last week. Coming down stairs and her right foot "gave way under me." Injured right knee and right foot. Has swelling to both. Mild discomfort. Reports "my doctor knows I fall a lot." Appointment made for tomorrow.  Reason for Disposition . MILD weakness (i.e., does not interfere with ability to work, go to school, normal activities) (Exception: mild weakness is a chronic symptom)  Answer Assessment - Initial Assessment Questions 1. MECHANISM: "How did the fall happen?"     Home - going down stairs 2. DOMESTIC VIOLENCE AND ELDER ABUSE SCREENING: "Did you fall because someone pushed you or tried to hurt you?" If Yes, ask: "Are you safe now?"     No 3. ONSET: "When did the fall happen?" (e.g., minutes, hours, or days ago)     Last week 4. LOCATION: "What part of the body hit the ground?" (e.g., back, buttocks, head, hips, knees, hands, head, stomach)     Fell on right knee 5. INJURY: "Did you hurt (injure) yourself when you fell?" If Yes, ask: "What did you injure? Tell me more about this?" (e.g., body area; type of injury; pain severity)"     Right knee and foot 6. PAIN: "Is there any pain?" If Yes, ask: "How bad is the pain?" (e.g., Scale 1-10; or mild,  moderate, severe)   - NONE (0): no pain   - MILD (1-3): doesn't interfere with normal activities    - MODERATE (4-7): interferes with normal activities or awakens from sleep    - SEVERE (8-10): excruciating pain, unable to do any normal activities      Mild 7. SIZE: For cuts, bruises, or swelling, ask: "How large is it?" (e.g., inches or centimeters)      No 8. PREGNANCY: "Is there any chance you are pregnant?" "When was your last menstrual period?"     No 9. OTHER SYMPTOMS: "Do you have any other symptoms?" (e.g., dizziness, fever, weakness; new onset or worsening).      No 10. CAUSE: "What do you think caused the fall (or falling)?" (e.g., tripped, dizzy spell)       Foot "gave way"  Protocols used:  FALLS AND FALLING-A-AH

## 2021-01-08 ENCOUNTER — Ambulatory Visit (INDEPENDENT_AMBULATORY_CARE_PROVIDER_SITE_OTHER): Payer: Medicare Other | Admitting: Internal Medicine

## 2021-01-08 ENCOUNTER — Encounter: Payer: Self-pay | Admitting: Internal Medicine

## 2021-01-08 ENCOUNTER — Other Ambulatory Visit: Payer: Self-pay

## 2021-01-08 VITALS — BP 110/74 | HR 77 | Temp 98.2°F | Ht 64.0 in | Wt 164.0 lb

## 2021-01-08 DIAGNOSIS — M25571 Pain in right ankle and joints of right foot: Secondary | ICD-10-CM

## 2021-01-08 DIAGNOSIS — M25561 Pain in right knee: Secondary | ICD-10-CM

## 2021-01-08 DIAGNOSIS — M5412 Radiculopathy, cervical region: Secondary | ICD-10-CM | POA: Diagnosis not present

## 2021-01-08 MED ORDER — FUROSEMIDE 20 MG PO TABS: 20.0000 mg | ORAL_TABLET | Freq: Every day | ORAL | 5 refills | Status: DC | PRN

## 2021-01-08 NOTE — Progress Notes (Signed)
Date:  01/08/2021   Name:  Jennifer Bright   DOB:  07-21-1966   MRN:  [id number]   Chief Complaint: Fall (X1 week, right knee, right foot injury, hurts when moves leg and when sitting and standing) and MRI (Had one Monday, found something on C5 and C6 which is pressing on nerve, pt is loosing feeling on right side )  Cervical MRI 01/07/21: IMPRESSION:  -Large rightward eccentric disc extrusion at C5-C6 with mass effect mainly in the foraminal and far lateral positions. -Otherwise, generalized spondylitic change with straightening of the usual sagittal alignment and canal stenosis as detailed explicitly above.   Fall The accident occurred more than 1 week ago. The fall occurred while walking. She fell from a height of 1 to 2 ft. She landed on concrete. There was no blood loss. The point of impact was the right knee and right foot (landed on right knee but mostly twisted right ankle under neath her.). Associated symptoms include numbness. Pertinent negatives include no fever or headaches.   Arm pain - she continues to have right > left arm pain that feels like something is being ripped out deep inside.  She has some decreased grip strength and sometimes drops things.  MRI above done yesterday.  Waiting for Neurosurgical consult.  Lab Results  Component Value Date   CREATININE 1.12 (H) 10/14/2020   BUN 17 10/14/2020   NA 140 10/14/2020   K 3.7 10/14/2020   CL 103 10/14/2020   CO2 21 10/14/2020   Lab Results  Component Value Date   CHOL 197 12/13/2019   HDL 50 12/13/2019   LDLCALC 111 (H) 12/13/2019   TRIG 207 (H) 12/13/2019   Lab Results  Component Value Date   TSH 2.190 10/14/2020   Lab Results  Component Value Date   HGBA1C 5.9 (H) 10/14/2020   Lab Results  Component Value Date   WBC 6.4 06/25/2020   HGB 14.4 06/25/2020   HCT 41.9 06/25/2020   MCV 88.0 06/25/2020   PLT 213 06/25/2020   Lab Results  Component Value Date   ALT 30 06/25/2020   AST 21 06/25/2020    ALKPHOS 61 06/25/2020   BILITOT 0.7 06/25/2020     Review of Systems  Constitutional: Negative for chills, fever and unexpected weight change.  Respiratory: Negative for chest tightness and shortness of breath.   Cardiovascular: Negative for chest pain.  Musculoskeletal: Positive for arthralgias (right knee and ankle) and myalgias.  Neurological: Positive for syncope, weakness and numbness. Negative for dizziness and headaches.    Patient Active Problem List   Diagnosis Date Noted  . Gastritis without bleeding   . S/P total abdominal hysterectomy and bilateral salpingo-oophorectomy 08/14/2020  . Chronic idiopathic constipation 08/14/2020  . Depression, major, recurrent, severe with psychosis (HCC) 06/26/2020  . Seizure-like activity (HCC) 06/04/2020  . Urinary incontinence without sensory awareness 05/21/2020  . Bipolar 2 disorder, major depressive episode (HCC) 09/19/2019  . Stage 3a chronic kidney disease (HCC) 07/10/2019  . Proteinuria 07/10/2019  . Myalgia 12/30/2017  . Vaginal atrophy 07/12/2017  . Polypharmacy 08/14/2016  . 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 Hypercalcemia 07/24/2016  . Hyperlipidemia, unspecified 11/15/2015  . Prediabetes 11/15/2015  . Anxiety disorder 07/29/2015  . Benign essential tremor 05/12/2015  . GERD with esophagitis 05/12/2015  . Hypersomnia with sleep apnea 05/12/2015  . Restless leg 05/12/2015  . Acquired hypothyroidism 05/12/2015  . Chronic migraine without aura 09/03/2012  . 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  Allergies  Allergen Reactions  . Cleocin [Clindamycin Hcl] Other (See Comments)    GI distress    Past Surgical History:  Procedure Laterality Date  . ABDOMINAL HYSTERECTOMY    . APPENDECTOMY    . BLADDER SURGERY     bladder tact  . CATARACT EXTRACTION W/PHACO Right 09/21/2018   Procedure: CATARACT EXTRACTION PHACO AND INTRAOCULAR LENS PLACEMENT (IOC);  Surgeon: Nevada Crane, MD;  Location: [facility] ORS;   Service: Ophthalmology;  Laterality: Right;  [place]  00:20 CDE 00.82 Fluid pack lot # [id number]  . CATARACT EXTRACTION W/PHACO Left 10/17/2018   Procedure: CATARACT EXTRACTION PHACO AND INTRAOCULAR LENS PLACEMENT (IOC)  LEFT;  Surgeon: Nevada Crane, MD;  Location: [facility];  Service: Ophthalmology;  Laterality: Left;  diabetic - diet controlled  . CESAREAN SECTION    . CHOLECYSTECTOMY    . COLONOSCOPY WITH PROPOFOL N/A 04/05/2017   Procedure: COLONOSCOPY WITH PROPOFOL;  Surgeon: Christena Deem, MD;  Location: [facility] ENDOSCOPY;  Service: Endoscopy;  Laterality: N/A;  . ESOPHAGOGASTRODUODENOSCOPY (EGD) WITH PROPOFOL N/A 08/28/2016   Procedure: ESOPHAGOGASTRODUODENOSCOPY (EGD) WITH PROPOFOL;  Surgeon: Christena Deem, MD;  Location: [facility] ENDOSCOPY;  Service: Endoscopy;  Laterality: N/A;  . ESOPHAGOGASTRODUODENOSCOPY (EGD) WITH PROPOFOL N/A 11/08/2020   Procedure: ESOPHAGOGASTRODUODENOSCOPY (EGD) WITH PROPOFOL w// biopsy;  Surgeon: Midge Minium, MD;  Location: [facility];  Service: Endoscopy;  Laterality: N/A;  Pre-Diabetic  . HERNIA REPAIR    . INGUINAL HERNIA REPAIR     rt and left  . lexiscan cardiolite    . TILT TABLE STUDY N/A 04/01/2012   Procedure: TILT TABLE STUDY;  Surgeon: Duke Salvia, MD;  Location: [facility] CATH LAB;  Service: Cardiovascular;  Laterality: N/A;    Social History   Tobacco Use  . Smoking status: Former Smoker    Types: Cigarettes    Quit date: 2018    Years since quitting: 4.2  . Smokeless tobacco: Never Used  Vaping Use  . Vaping Use: Never used  Substance Use Topics  . Alcohol use: No  . Drug use: No     Medication list has been reviewed and updated.  Current Meds  Medication Sig  . ALPRAZolam (XANAX XR) 1 MG 24 hr tablet Take 1 mg by mouth daily. Dr. Evelene Croon  . ALPRAZolam (XANAX) 1 MG tablet Take 1 mg by mouth at bedtime as needed for anxiety. Dr. Evelene Croon- 1-3 as needed  . amphetamine-dextroamphetamine (ADDERALL XR) 30 MG 24 hr capsule  Take 30 mg by mouth 2 (two) times daily.  Marland Kitchen atorvastatin (LIPITOR) 40 MG tablet Take 40 mg by mouth at bedtime. Prescribed by PCP  . benztropine (COGENTIN) 1 MG tablet Take 1 mg by mouth 2 (two) times daily. Dr. Evelene Croon  . Cholecalciferol (VITAMIN D3) 50 MCG (2000 UT) capsule Take 2,000 Units by mouth daily.  [name] 100 MG CAPS Take 100 mg by mouth daily.   . DULoxetine (CYMBALTA) 60 MG capsule Take 60 mg by mouth daily. Dr. Evelene Croon  . gabapentin (NEURONTIN) 300 MG capsule Take 1 capsule by mouth in the morning and at bedtime.  Marland Kitchen levothyroxine (SYNTHROID) 100 MCG tablet TAKE 1 TABLET EVERY DAY ON EMPTY STOMACHWITH A GLASS OF WATER AT LEAST 30-60 MINBEFORE BREAKFAST (Patient taking differently: Take 100 mcg by mouth daily before breakfast.)  . linaclotide (LINZESS) 290 MCG CAPS capsule Take 1 capsule (290 mcg total) by mouth daily before breakfast. (Patient taking differently: Take 290 mcg by mouth daily as needed.)  .  Melatonin 10 MG TABS Take 10 mg by mouth at bedtime.  . Multiple Vitamin (MULTIVITAMIN) capsule Take by mouth.  . Omega-3 300 MG CAPS Take 1.2 g by mouth daily.   . pantoprazole (PROTONIX) 40 MG tablet Take 1 tablet (40 mg total) by mouth 2 (two) times daily. (Patient taking differently: Take 40 mg by mouth daily.)  . primidone (MYSOLINE) 50 MG tablet Take 50 mg by mouth in the morning and at bedtime. 50 mg in AM 100 mg in PM Prescribed by PCP  . rOPINIRole (REQUIP) 1 MG tablet Take 1.5 mg by mouth at bedtime. Dr. Meredeth Ide  . topiramate (TOPAMAX) 200 MG tablet TAKE 1 TABLET BY MOUTH IN  THE MORNING AND 1 TABLET BY MOUTH IN THE EVENING  . traZODone (DESYREL) 100 MG tablet Take 150-200 mg by mouth at bedtime. Dr Evelene Croon  . ziprasidone (GEODON) 60 MG capsule Take 120 mg by mouth every evening. Dr. Evelene Croon  . [DISCONTINUED] furosemide (LASIX) 20 MG tablet Take 20 mg by mouth daily as needed. Prescribed by PCP    [facility] 2/9 Scores 01/08/2021 12/17/2020 10/14/2020 08/14/2020  PHQ - 2  Score 4 6 4 4   PHQ- 9 Score 11 16 19 21   Exception Documentation - - - -  Some encounter information is confidential and restricted. Go to Review Flowsheets activity to see all data.    GAD 7 : Generalized Anxiety Score 01/08/2021 12/17/2020 10/14/2020 08/14/2020  Nervous, Anxious, on Edge 3 2 3 2   Control/stop worrying 3 2 3 3   Worry too much - different things 3 2 3 3   Trouble relaxing 3 3 3 3   Restless 2 0 2 2  Easily annoyed or irritable 2 2 3 2   Afraid - awful might happen 2 0 3 2  Total GAD 7 Score 18 11 20 17   Anxiety Difficulty - Somewhat difficult Very difficult Somewhat difficult    BP Readings from Last 3 Encounters:  01/08/21 110/74  12/17/20 104/62  11/08/20 96/68    Physical Exam Constitutional:      Appearance: Normal appearance.  Cardiovascular:     Rate and Rhythm: Normal rate and regular rhythm.     Pulses:          Dorsalis pedis pulses are 2+ on the right side and 2+ on the left side.       Posterior tibial pulses are 1+ on the right side and 1+ on the left side.  Pulmonary:     Effort: Pulmonary effort is normal.     Breath sounds: No wheezing or rhonchi.  Musculoskeletal:     Right knee: No effusion, erythema or ecchymosis. Normal range of motion. Tenderness present over the patellar tendon.     Left knee: Normal.     Right ankle: Ecchymosis (bruising anterior ankle) present. Tenderness present. Decreased range of motion.  Neurological:     Mental Status: She is alert.  Psychiatric:        Attention and Perception: Attention normal.        Mood and Affect: Affect is tearful (anxious about health issues and wishing that she did not talk so much.).     Wt Readings from Last 3 Encounters:  01/08/21 164 lb (74.4 kg)  12/17/20 162 lb (73.5 kg)  11/08/20 162 lb (73.5 kg)    BP 110/74   Pulse 77   Temp 98.2 F (36.8 C) (Oral)   Ht 5\' 4"  (1.626 m)   Wt 164 lb (74.4 kg)  SpO2 96%   BMI 28.15 kg/m   Assessment and Plan: 1. Acute pain of right  knee Mild contusion which is not currently limiting her acitvity  2. Acute right ankle pain Suspect a simple sprain but can not rule out occult fracture She would prefer watchful waiting and will call if she does not improve over the next 2 weeks Can use a soft brace or ACE if desired  3. Cervical radiculopathy at C5 Worsening right arm sx with significant disc disease by MRI Follow up with Neurosurgery as planned   Partially dictated using Dragon software. Any errors are unintentional.  Bari EdwardLaura Dorette Hartel, MD [facility]  01/08/2021

## 2021-01-20 ENCOUNTER — Encounter: Payer: Self-pay | Admitting: Internal Medicine

## 2021-01-21 ENCOUNTER — Other Ambulatory Visit: Payer: Self-pay | Admitting: Internal Medicine

## 2021-01-21 DIAGNOSIS — R569 Unspecified convulsions: Secondary | ICD-10-CM

## 2021-01-21 MED ORDER — PRIMIDONE 50 MG PO TABS: ORAL_TABLET | ORAL | 1 refills | Status: DC

## 2021-01-21 NOTE — Telephone Encounter (Signed)
Patient received primidone from her previous PCP. Will you be refilling this for her?  Thank you.

## 2021-01-26 ENCOUNTER — Other Ambulatory Visit: Payer: Self-pay | Admitting: Internal Medicine

## 2021-01-27 ENCOUNTER — Other Ambulatory Visit: Payer: Self-pay | Admitting: Internal Medicine

## 2021-01-28 ENCOUNTER — Encounter: Payer: Self-pay | Admitting: Internal Medicine

## 2021-01-30 NOTE — Telephone Encounter (Signed)
Called pt left VM to call back.  Will route this note to [facility] Nurse just in case the patient returns call to clinic. [facility] Nurse may give patient results if pt returns the call.  CRM has also been created for this msg for call center purposes. Please attach any notes regarding this lab to this result message and do not create a new CRM.   KP   

## 2021-01-31 ENCOUNTER — Ambulatory Visit: Payer: Self-pay | Admitting: *Deleted

## 2021-01-31 NOTE — Telephone Encounter (Signed)
Patient called on both numbers listed, left VM to return the call to the office for a message from Dr. Judithann Graves.

## 2021-01-31 NOTE — Telephone Encounter (Signed)
  Pt's husband called in in regards to the Topamax.   Wife with him in the background but she had some dental surgery done this week and it hurts to talk.  See notes below.   Dr. Judithann Graves wanted to know how many Topamax tablets pt was taking a day.   She is taking 3 a day per her neurologist who has moved and no longer with the practice.  She didn't want to run out of medication and needs a refill.  He is going to call the [facility] and tell them he wants a refill of the Topamax not the mail order pharmacy this time.    Dr. Judithann Graves should be getting a refill request from [facility] as he was going to call as soon as we end our call.      Reason for Disposition . [1] Caller has NON-URGENT medicine question about med that PCP prescribed AND [2] triager unable to answer question    Dr. Judithann Graves wanted to know how many tablets a day she was taking of the Topamax.  Answer Assessment - Initial Assessment Questions 1. NAME of MEDICATION: "What medicine are you calling about?"     She is returning a call regarding the dosage of the Topamax.   Dr. Judithann Graves wanted to know if she was taking 2 or 3 tablets a day.   The max a day is 2. 2. QUESTION: "What is your question?" (e.g., medication refill, side effect)     How many tablets a day are you taking of the Topamax.  She is taking 2 in the morning and 1 in the afternoon per the neurologist Dr. Patsy Lager in [place].   He has moved. 3. PRESCRIBING HCP: "Who prescribed it?" Reason: if prescribed by specialist, call should be referred to that group.     Dr. Patsy Lager, neurologist in [place] that has moved no longer with practice. 4. SYMPTOMS: "Do you have any symptoms?"     No 5. SEVERITY: If symptoms are present, ask "Are they mild, moderate or severe?"     N/A 6. PREGNANCY:  "Is there any chance that you are pregnant?" "When was your last menstrual period?"     N/A  Protocols used: MEDICATION QUESTION CALL-A-AH

## 2021-02-03 ENCOUNTER — Other Ambulatory Visit: Payer: Self-pay

## 2021-02-03 MED ORDER — TOPIRAMATE 200 MG PO TABS: ORAL_TABLET | ORAL | 0 refills | Status: DC

## 2021-02-03 NOTE — Telephone Encounter (Signed)
Spoke to pt. Pt is aware the max dose is twice a day. Refill was sent in.  KP

## 2021-02-07 ENCOUNTER — Ambulatory Visit: Payer: Self-pay | Admitting: *Deleted

## 2021-02-07 NOTE — Telephone Encounter (Signed)
Pt called with complaints of complaints of a lump 3 - 4 inches above her right ankle; the area is about the size of a dime; the area is not discolored and she denies pain; the pt says her feet are swollen and her toes "look like viennas"; the pt says she had dental procedure a week ago and she was in a great deal of pain; she attempted to contact the dentist but got no response; this is why she did not get out to the recliner; the pt says she did not drink a lot during that time; the pt also took ibuprofen because it helped along with the pain medication; she had chest pain on 02/06/21 when she was sitting on the floor working with her medication bottles; the pt says her chest pain was "like someone was standing on her chest"; she took alprazolam and it took a while for the pain to resolve;  her temp was 99.7 on 02/06/21; her temp is normally 97; recommendations made per nurse triage protocol; she verbalized understanding and will go to the ED; the pt sees Dr Judithann Graves at [facility]; will route to the office for notification.  Reason for Disposition . [1] Chest pain lasts > 5 minutes AND [2] occurred in past 3 days (72 hours) (Exception: feels exactly the same as previously diagnosed heartburn and has accompanying sour taste in mouth)  Answer Assessment - Initial Assessment Questions 1. LOCATION: "Where does it hurt?"       Center of chest 2. RADIATION: "Does the pain go anywhere else?" (e.g., into neck, jaw, arms, back)     3. ONSET: "When did the chest pain begin?" (Minutes, hours or days)      02/06/21 4. PATTERN "Does the pain come and go, or has it been constant since it started?"  "Does it get worse with exertion?"     Constant; now resolved 5. DURATION: "How long does it last" (e.g., seconds, minutes, hours)   hours 6. SEVERITY: "How bad is the pain?"  (e.g., Scale 1-10; mild, moderate, or severe)    - MILD (1-3): doesn't interfere with normal activities     - MODERATE (4-7): interferes with  normal activities or awakens from sleep    - SEVERE (8-10): excruciating pain, unable to do any normal activities      severe 7. CARDIAC RISK FACTORS: "Do you have any history of heart problems or risk factors for heart disease?" (e.g., angina, prior heart attack; diabetes, high blood pressure, high cholesterol, smoker, or strong family history of heart disease)     no 8. PULMONARY RISK FACTORS: "Do you have any history of lung disease?"  (e.g., blood clots in lung, asthma, emphysema, birth control pills)   no 9. CAUSE: "What do you think is causing the chest pain?"     Not sure 10. OTHER SYMPTOMS: "Do you have any other symptoms?" (e.g., dizziness, nausea, vomiting, sweating, fever, difficulty breathing, cough)       Leg swelling, "toes look like viennas", lump on right ankle 11. PREGNANCY: "Is there any chance you are pregnant?" "When was your last menstrual period?"  Protocols used: CHEST PAIN-A-AH

## 2021-02-07 NOTE — Telephone Encounter (Signed)
Noted pt will go to ED.  KP

## 2021-02-12 DIAGNOSIS — M5412 Radiculopathy, cervical region: Secondary | ICD-10-CM | POA: Diagnosis not present

## 2021-02-12 DIAGNOSIS — R569 Unspecified convulsions: Secondary | ICD-10-CM | POA: Diagnosis not present

## 2021-02-12 DIAGNOSIS — G43809 Other migraine, not intractable, without status migrainosus: Secondary | ICD-10-CM | POA: Diagnosis not present

## 2021-02-12 DIAGNOSIS — M542 Cervicalgia: Secondary | ICD-10-CM | POA: Diagnosis not present

## 2021-02-21 DIAGNOSIS — M5412 Radiculopathy, cervical region: Secondary | ICD-10-CM | POA: Diagnosis not present

## 2021-02-21 DIAGNOSIS — M542 Cervicalgia: Secondary | ICD-10-CM | POA: Diagnosis not present

## 2021-03-19 DIAGNOSIS — H903 Sensorineural hearing loss, bilateral: Secondary | ICD-10-CM | POA: Diagnosis not present

## 2021-03-28 ENCOUNTER — Telehealth: Payer: Self-pay | Admitting: Internal Medicine

## 2021-03-28 NOTE — Telephone Encounter (Signed)
Copied from CRM [id number]. Topic: Medicare AWV >> Mar 28, 2021  2:22 PM Claudette Laws R wrote: Reason for CRM:  Left message for patient to call back and schedule Medicare Annual Wellness Visit (AWV) in office.   If unable to come into the office for AWV,  please offer to do virtually or by telephone.  No hx of AWV eligible for AWVI as of 10/19/2009  Please schedule at anytime with [facility] Health Advisor.      40 Minutes appointment   Any questions, please call me at [phone]

## 2021-04-02 ENCOUNTER — Ambulatory Visit: Payer: Medicare Other | Admitting: Internal Medicine

## 2021-04-08 ENCOUNTER — Other Ambulatory Visit: Payer: Self-pay

## 2021-04-08 ENCOUNTER — Ambulatory Visit (INDEPENDENT_AMBULATORY_CARE_PROVIDER_SITE_OTHER): Payer: Medicare Other | Admitting: Internal Medicine

## 2021-04-08 ENCOUNTER — Encounter: Payer: Self-pay | Admitting: Internal Medicine

## 2021-04-08 VITALS — BP 104/78 | HR 88 | Temp 98.9°F | Ht 64.0 in

## 2021-04-08 DIAGNOSIS — M62838 Other muscle spasm: Secondary | ICD-10-CM

## 2021-04-08 DIAGNOSIS — E039 Hypothyroidism, unspecified: Secondary | ICD-10-CM

## 2021-04-08 DIAGNOSIS — R7303 Prediabetes: Secondary | ICD-10-CM | POA: Diagnosis not present

## 2021-04-08 DIAGNOSIS — Z13820 Encounter for screening for osteoporosis: Secondary | ICD-10-CM

## 2021-04-08 DIAGNOSIS — E785 Hyperlipidemia, unspecified: Secondary | ICD-10-CM | POA: Diagnosis not present

## 2021-04-08 DIAGNOSIS — D860 Sarcoidosis of lung: Secondary | ICD-10-CM | POA: Diagnosis not present

## 2021-04-08 DIAGNOSIS — Z23 Encounter for immunization: Secondary | ICD-10-CM | POA: Diagnosis not present

## 2021-04-08 NOTE — Progress Notes (Signed)
Date:  04/08/2021   Name:  Jennifer Bright   DOB:  06/01/1966   MRN:  [id number]   Chief Complaint: Hyperlipidemia and Hypothyroidism  Hyperlipidemia This is a chronic problem. The problem is controlled. Associated symptoms include chest pain, myalgias and shortness of breath. Current antihyperlipidemic treatment includes statins.  Thyroid Problem Symptoms include anxiety. Patient reports no fatigue, palpitations or visual change. Her past medical history is significant for hyperlipidemia.  Diabetes She presents for her follow-up diabetic visit. Diabetes type: prediabetes. Hypoglycemia symptoms include dizziness and nervousness/anxiousness. Pertinent negatives for hypoglycemia include no headaches. Associated symptoms include chest pain and weakness. Pertinent negatives for diabetes include no fatigue, no foot ulcerations and no visual change. Symptoms are stable. Current diabetic treatment includes diet. She is compliant with treatment most of the time.  Sarcoidosis - of the lung.  She did not like Dr. Meredeth IdeFleming and wants to get another Pulmonary referral. Chest pain - gets a hard pain in her left upper chest at the end of a busy day.  Feels like a hard sharp pain that radiates under her arm.  No SOB or dizziness with it.  She does feel very sleepy.  Movement and palpation do not exacerbate or relieve it.  It resolves on its own after 30 min or so.  Ativan sometimes helps.  This happens several times per week and is not worsening. Osteopenia - noted on previous DEXA - no report available.  She is taking vitamin D but is cautious about calcium.  Lab Results  Component Value Date   CREATININE 1.12 (H) 10/14/2020   BUN 17 10/14/2020   NA 140 10/14/2020   K 3.7 10/14/2020   CL 103 10/14/2020   CO2 21 10/14/2020   Lab Results  Component Value Date   CHOL 197 12/13/2019   HDL 50 12/13/2019   LDLCALC 111 (H) 12/13/2019   TRIG 207 (H) 12/13/2019   Lab Results  Component Value Date    TSH 2.190 10/14/2020   Lab Results  Component Value Date   HGBA1C 5.9 (H) 10/14/2020   Lab Results  Component Value Date   WBC 6.4 06/25/2020   HGB 14.4 06/25/2020   HCT 41.9 06/25/2020   MCV 88.0 06/25/2020   PLT 213 06/25/2020   Lab Results  Component Value Date   ALT 30 06/25/2020   AST 21 06/25/2020   ALKPHOS 61 06/25/2020   BILITOT 0.7 06/25/2020     Review of Systems  Constitutional:  Negative for chills, fatigue, fever and unexpected weight change.  Respiratory:  Positive for shortness of breath. Negative for cough, chest tightness and wheezing.   Cardiovascular:  Positive for chest pain. Negative for palpitations and leg swelling.  Gastrointestinal:  Negative for abdominal pain.  Musculoskeletal:  Positive for arthralgias and myalgias.  Neurological:  Positive for dizziness and weakness. Negative for light-headedness, numbness and headaches.  Psychiatric/Behavioral:  Positive for dysphoric mood, hallucinations (auditory) and sleep disturbance. The patient is nervous/anxious.    Patient Active Problem List   Diagnosis Date Noted   Gastritis without bleeding    S/P total abdominal hysterectomy and bilateral salpingo-oophorectomy 08/14/2020   Chronic idiopathic constipation 08/14/2020   Depression, major, recurrent, severe with psychosis (HCC) 06/26/2020   Seizure-like activity (HCC) 06/04/2020   Urinary incontinence without sensory awareness 05/21/2020   Bipolar 2 disorder, major depressive episode (HCC) 09/19/2019   Stage 3a chronic kidney disease (HCC) 07/10/2019   Proteinuria 07/10/2019   Myalgia 12/30/2017   Vaginal atrophy  07/12/2017   Polypharmacy 08/14/2016   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   Hypercalcemia 07/24/2016   Hyperlipidemia, unspecified 11/15/2015   Prediabetes 11/15/2015   Anxiety disorder 07/29/2015   Benign essential tremor 05/12/2015   GERD with esophagitis 05/12/2015   Hypersomnia with sleep apnea 05/12/2015   Restless leg  05/12/2015   Acquired hypothyroidism 05/12/2015   Chronic migraine without aura 09/03/2012   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    Allergies  Allergen Reactions   Cleocin [Clindamycin Hcl] Other (See Comments)    GI distress    Past Surgical History:  Procedure Laterality Date   ABDOMINAL HYSTERECTOMY     APPENDECTOMY     BLADDER SURGERY     bladder tact   CATARACT EXTRACTION W/PHACO Right 09/21/2018   Procedure: CATARACT EXTRACTION PHACO AND INTRAOCULAR LENS PLACEMENT (IOC);  Surgeon: Nevada Crane, MD;  Location: [facility] ORS;  Service: Ophthalmology;  Laterality: Right;  [place]  00:20 CDE 00.82 Fluid pack lot # [id number]   CATARACT EXTRACTION W/PHACO Left 10/17/2018   Procedure: CATARACT EXTRACTION PHACO AND INTRAOCULAR LENS PLACEMENT (IOC)  LEFT;  Surgeon: Nevada Crane, MD;  Location: [facility];  Service: Ophthalmology;  Laterality: Left;  diabetic - diet controlled   CESAREAN SECTION     CHOLECYSTECTOMY     COLONOSCOPY WITH PROPOFOL N/A 04/05/2017   Procedure: COLONOSCOPY WITH PROPOFOL;  Surgeon: Christena Deem, MD;  Location: [facility] ENDOSCOPY;  Service: Endoscopy;  Laterality: N/A;   ESOPHAGOGASTRODUODENOSCOPY (EGD) WITH PROPOFOL N/A 08/28/2016   Procedure: ESOPHAGOGASTRODUODENOSCOPY (EGD) WITH PROPOFOL;  Surgeon: Christena Deem, MD;  Location[facility] END[facility]COPY;  Service: Endoscopy;  Laterality: N/A;   ESOPHAGOGASTRODUODENOSCOPY (EGD) WITH PROPOFOL N/A 11/08/2020   Procedure: ESOPHAGOGASTRODUODENOSCOPY (EGD) WITH PROPOFOL w// biopsy;  Surgeon: Midge Minium, MD;  Location: [facility];  Service: Endoscopy;  Laterality: N/A;  Pre-Diabetic   HERNIA REPAIR     INGUINAL HERNIA REPAIR     rt and left   lexiscan cardiolite     TILT TABLE STUDY N/A 04/01/2012   Procedure: TILT TABLE STUDY;  Surgeon: [facility] Salvia, MD;  Location: [facility] CATH LAB;  Service: Cardiovascular;  Laterality: N/A;    Social History   Tobacco Use   Smoking status: Former    Pack  years: 0.00    Types: Cigarettes    Quit date: 2018    Years since quitting: 4.4   Smokeless tobacco: Never  Vaping Use   Vaping Use: Never used  Substance Use Topics   Alcohol use: No   Drug use: No     Medication list has been reviewed and updated.  Current Meds  Medication Sig   ALPRAZolam (XANAX XR) 1 MG 24 hr tablet Take 1 mg by mouth daily. Dr. Evelene Croon   ALPRAZolam Prudy Feeler) 1 MG tablet Take 1 mg by mouth at bedtime as needed for anxiety.   amphetamine-dextroamphetamine (ADDERALL XR) 30 MG 24 hr capsule Take 30 mg by mouth 2 (two) times daily.   atorvastatin (LIPITOR) 40 MG tablet Take 40 mg by mouth at bedtime. Prescribed by PCP   benztropine (COGENTIN) 1 MG tablet Take 1 mg by mouth 2 (two) times daily. Dr. Evelene Croon   Cholecalciferol (VITAMIN D3) 50 MCG (2000 UT) capsule Take 2,000 Units by mouth daily.   Docusate Sodium (DSS) 100 MG CAPS Take 100 mg by mouth daily.    DULoxetine (CYMBALTA) 60 MG capsule Take 60 mg by mouth daily. Dr. Evelene Croon   furosemide (LASIX) 20 MG tablet Take 1  tablet (20 mg total) by mouth daily as needed. Prescribed by PCP   gabapentin (NEURONTIN) 300 MG capsule Take 1 capsule by mouth in the morning and at bedtime.   levothyroxine (SYNTHROID) 100 MCG tablet TAKE 1 TABLET EVERY DAY ON EMPTY STOMACHWITH A GLASS OF WATER AT LEAST 30-60 MINBEFORE BREAKFAST (Patient taking differently: Take 100 mcg by mouth daily before breakfast.)   linaclotide (LINZESS) 290 MCG CAPS capsule Take 1 capsule (290 mcg total) by mouth daily before breakfast. (Patient taking differently: Take 290 mcg by mouth daily as needed.)   Melatonin 10 MG TABS Take 10 mg by mouth at bedtime.   Multiple Vitamin (MULTIVITAMIN) capsule Take by mouth.   Omega-3 300 MG CAPS Take 1.2 g by mouth daily.    pantoprazole (PROTONIX) 40 MG tablet Take 1 tablet (40 mg total) by mouth 2 (two) times daily. (Patient taking differently: Take 40 mg by mouth daily.)   primidone (MYSOLINE) 50 MG tablet Take 1 tablet  (50 mg total) by mouth 2 (two) times daily at 10 AM and 5 PM AND 2 tablets (100 mg total) at bedtime.   rOPINIRole (REQUIP) 1 MG tablet Take 1.5 mg by mouth at bedtime. Dr. Meredeth Ide   topiramate (TOPAMAX) 200 MG tablet TAKE 1 TABLET BY MOUTH IN  THE MORNING AND 1 TABLET BY MOUTH IN THE EVENING   traZODone (DESYREL) 100 MG tablet Take 150-200 mg by mouth at bedtime. Dr Evelene Croon   ziprasidone (GEODON) 60 MG capsule Take 120 mg by mouth every evening. Dr. Evelene Croon    [facility] 2/9 Scores 04/08/2021 01/08/2021 12/17/2020 10/14/2020  PHQ - 2 Score 5 4 6 4   PHQ- 9 Score 18 11 16 19   Exception Documentation - - - -  Some encounter information is confidential and restricted. Go to Review Flowsheets activity to see all data.    GAD 7 : Generalized Anxiety Score 04/08/2021 01/08/2021 12/17/2020 10/14/2020  Nervous, Anxious, on Edge 3 3 2 3   Control/stop worrying 2 3 2 3   Worry too much - different things 2 3 2 3   Trouble relaxing 2 3 3 3   Restless 2 2 0 2  Easily annoyed or irritable 2 2 2 3   Afraid - awful might happen 2 2 0 3  Total GAD 7 Score 15 18 11 20   Anxiety Difficulty Somewhat difficult - Somewhat difficult Very difficult    BP Readings from Last 3 Encounters:  04/08/21 104/78  01/08/21 110/74  12/17/20 104/62    Physical Exam Vitals and nursing note reviewed.  Constitutional:      General: She is not in acute distress.    Appearance: Normal appearance. She is well-developed.  HENT:     Head: Normocephalic and atraumatic.  Neck:     Vascular: No carotid bruit.  Cardiovascular:     Rate and Rhythm: Regular rhythm. Bradycardia present.     Pulses: Normal pulses.     Heart sounds: No murmur heard. Pulmonary:     Effort: Pulmonary effort is normal. No respiratory distress.     Breath sounds: No wheezing or rhonchi.  Musculoskeletal:     Cervical back: Normal range of motion.     Right lower leg: No edema.     Left lower leg: No edema.  Lymphadenopathy:     Cervical: No cervical adenopathy.   Skin:    General: Skin is warm and dry.     Findings: No rash.  Neurological:     Mental Status: She is alert and  oriented to person, place, and time.     Motor: Weakness present.     Gait: Gait abnormal (in wheelchair).  Psychiatric:        Mood and Affect: Mood normal.        Behavior: Behavior normal.    Wt Readings from Last 3 Encounters:  01/08/21 164 lb (74.4 kg)  12/17/20 162 lb (73.5 kg)  11/08/20 162 lb (73.5 kg)    BP 104/78   Pulse 88   Temp 98.9 F (37.2 C) (Oral)   Ht 5\' 4"  (1.626 m)   SpO2 93%   BMI 28.15 kg/m   Assessment and Plan: 1. Hypothyroidism, unspecified type On supplements; check labs and adjust - TSH + free T4  2. Hyperlipidemia, unspecified hyperlipidemia type On Lipitor without side effects - Comprehensive metabolic panel - Lipid panel  3. Encounter for screening for osteoporosis - DG Bone Density; Future  4. Sarcoidosis of lung (HCC) Pt will call [facility] to see if they take her insurance - CBC with Differential/Platelet  5. Muscle spasm of left shoulder Likely the cause of her chest wall pain Continue to monitor and follow up if worsening  6. Need for shingles vaccine Check Ab status then advise - Varicella zoster antibody, IgG  7. Prediabetes Continue with dietary modifications - Hemoglobin A1c   Partially dictated using . Any errors are unintentional.  Animal nutritionist, MD [facility]  04/08/2021

## 2021-04-08 NOTE — Patient Instructions (Signed)
[facility] in [place] for Pulmonary

## 2021-04-09 LAB — CBC WITH DIFFERENTIAL/PLATELET
Basophils Absolute: 0.1 10*3/uL (ref 0.0–0.2)
Basos: 2 %
EOS (ABSOLUTE): 0.5 10*3/uL — ABNORMAL HIGH (ref 0.0–0.4)
Eos: 8 %
Hematocrit: 41 % (ref 34.0–46.6)
Hemoglobin: 13.6 g/dL (ref 11.1–15.9)
Immature Grans (Abs): 0 10*3/uL (ref 0.0–0.1)
Immature Granulocytes: 1 %
Lymphocytes Absolute: 1.7 10*3/uL (ref 0.7–3.1)
Lymphs: 31 %
MCH: 29.7 pg (ref 26.6–33.0)
MCHC: 33.2 g/dL (ref 31.5–35.7)
MCV: 90 fL (ref 79–97)
Monocytes Absolute: 0.3 10*3/uL (ref 0.1–0.9)
Monocytes: 6 %
Neutrophils Absolute: 3 10*3/uL (ref 1.4–7.0)
Neutrophils: 52 %
Platelets: 202 10*3/uL (ref 150–450)
RBC: 4.58 x10E6/uL (ref 3.77–5.28)
RDW: 12 % (ref 11.7–15.4)
WBC: 5.6 10*3/uL (ref 3.4–10.8)

## 2021-04-09 LAB — COMPREHENSIVE METABOLIC PANEL
ALT: 17 IU/L (ref 0–32)
AST: 16 IU/L (ref 0–40)
Albumin/Globulin Ratio: 2 (ref 1.2–2.2)
Albumin: 4.1 g/dL (ref 3.8–4.9)
Alkaline Phosphatase: 64 IU/L (ref 44–121)
BUN/Creatinine Ratio: 14 (ref 9–23)
BUN: 16 mg/dL (ref 6–24)
Bilirubin Total: <0.2 mg/dL (ref 0.0–1.2)
CO2: 23 mmol/L (ref 20–29)
Calcium: 9.6 mg/dL (ref 8.7–10.2)
Chloride: 105 mmol/L (ref 96–106)
Creatinine, Ser: 1.18 mg/dL — ABNORMAL HIGH (ref 0.57–1.00)
Globulin, Total: 2.1 g/dL (ref 1.5–4.5)
Glucose: 129 mg/dL — ABNORMAL HIGH (ref 65–99)
Potassium: 4.2 mmol/L (ref 3.5–5.2)
Sodium: 142 mmol/L (ref 134–144)
Total Protein: 6.2 g/dL (ref 6.0–8.5)
eGFR: 55 mL/min/{1.73_m2} — ABNORMAL LOW (ref >59)

## 2021-04-09 LAB — VARICELLA ZOSTER ANTIBODY, IGG: Varicella zoster IgG: 1100 index (ref >165)

## 2021-04-09 LAB — TSH+FREE T4
Free T4: 0.92 ng/dL (ref 0.82–1.77)
TSH: 0.669 u[IU]/mL (ref 0.450–4.500)

## 2021-04-09 LAB — LIPID PANEL
Chol/HDL Ratio: 5.9 ratio — ABNORMAL HIGH (ref 0.0–4.4)
Cholesterol, Total: 282 mg/dL — ABNORMAL HIGH (ref 100–199)
HDL: 48 mg/dL (ref >39)
LDL Chol Calc (NIH): 182 mg/dL — ABNORMAL HIGH (ref 0–99)
Triglycerides: 269 mg/dL — ABNORMAL HIGH (ref 0–149)
VLDL Cholesterol Cal: 52 mg/dL — ABNORMAL HIGH (ref 5–40)

## 2021-04-09 LAB — HEMOGLOBIN A1C
Est. average glucose Bld gHb Est-mCnc: 126 mg/dL
Hgb A1c MFr Bld: 6 % — ABNORMAL HIGH (ref 4.8–5.6)

## 2021-04-15 ENCOUNTER — Ambulatory Visit: Payer: Self-pay

## 2021-04-18 ENCOUNTER — Other Ambulatory Visit: Payer: Self-pay

## 2021-04-18 ENCOUNTER — Ambulatory Visit (INDEPENDENT_AMBULATORY_CARE_PROVIDER_SITE_OTHER): Payer: Medicare Other

## 2021-04-18 DIAGNOSIS — Z23 Encounter for immunization: Secondary | ICD-10-CM | POA: Diagnosis not present

## 2021-05-09 DIAGNOSIS — R413 Other amnesia: Secondary | ICD-10-CM | POA: Diagnosis not present

## 2021-05-09 DIAGNOSIS — G2581 Restless legs syndrome: Secondary | ICD-10-CM | POA: Diagnosis not present

## 2021-05-09 DIAGNOSIS — G4700 Insomnia, unspecified: Secondary | ICD-10-CM | POA: Diagnosis not present

## 2021-05-09 DIAGNOSIS — R519 Headache, unspecified: Secondary | ICD-10-CM | POA: Diagnosis not present

## 2021-05-26 NOTE — Progress Notes (Signed)
Tawana ScaleZach Maansi Wike D.O. [facility] [address] [place] [id number] Phone: [phone] Subjective:   Bruce Donath, Valerie Wolf, am serving as a scribe for Dr. Antoine PrimasZachary Eulalah Rupert.  This visit occurred during the SARS-CoV-2 public health emergency.  Safety protocols were in place, including screening questions prior to the visit, additional usage of staff PPE, and extensive cleaning of exam room while observing appropriate contact time as indicated for disinfecting solutions.    I'm seeing this patient by the request  of:  Reubin MilanBerglund, Laura H, MD  CC: Neck pain  BJY:[id number]:Subjective  Juan QuamMichele F Drakes is a 55 y.o. female coming in with complaint of spine pain in all segments. A few years ago patient fell off of doctor's table. Had MRI of neck and was found to have bulging disc. 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 Had to use electric wheelchair for 3 years due to passing out 6x a day. Using topomax. Patient passes out 1-2x a week.   Patient also notes pain in R arm and R leg. Pain in radiates from R shoulder down to R elbow. Feels like she has a tourniquet on her arm. Taking mm relaxer during the day for pain relief. Patient has been experiencing sharp pain throughout entire R side since November. Feels like "vessels are being ripped out of body."   Xray Cervical 2021 IMPRESSION: X-ray Mild degenerative disc disease is noted at C5-6 with mild bilateral neural foraminal stenosis at this level secondary to uncovertebral spurring. No acute abnormality is noted.  Patient was [facility] by [facility]  Patient was encouraged to start physical therapy and I discussed the possibility of pain management.  They highly did not recommend any type of surgical intervention.  Patient did have an MRI in March 2022 of the neck.  Had a large disc extrusion at C5-C6 as well as an osteophyte complex.  Causing right foraminal stenosis.  Very mild  spinal stenosis at C4-C5. MRI of the brain was normal  Past Medical History:  Diagnosis Date   Acid reflux    Anemia    Anxiety    Bipolar 1 disorder, manic, moderate (HCC) 09/19/2019   Bipolar depression (HCC) 05/12/2015   Bipolar disorder (HCC)    Bipolar disorder (HCC) 05/12/2015   Bleach ingestion 06/27/2020   Cataract    Chronic kidney disease    STAGE 3   Complication of anesthesia    Felt cataract procedures   Depression    Diabetes mellitus    NIDD   Essential tremor    Family history of adverse reaction to anesthesia    Mother - slow to wake   Family history of breast cancer    doesn't meet medicare guidelines for cancer genetic testing.    Family history of ovarian cancer    Pt doesn't meet Medicare genetic testing guidelines   Hyperlipidemia    Hypothyroid    IBS (irritable bowel syndrome)    Injury by caustic substances, except poisoning, undetermined whether accidentally or purposely inflicted    Migraine headache    vestibular migraine   Obesity    Osteopenia    Restless leg syndrome    Sarcoidosis    Self-inflicted laceration of left wrist (HCC) 09/19/2019   Sleep apnea    Syncope \   Past Surgical History:  Procedure Laterality Date   ABDOMINAL HYSTERECTOMY     APPENDECTOMY     BLADDER SURGERY  bladder tact   CATARACT EXTRACTION W/PHACO Right 09/21/2018   Procedure: CATARACT EXTRACTION PHACO AND INTRAOCULAR LENS PLACEMENT (IOC);  Surgeon: Nevada Crane, MD;  Location: [facility] ORS;  Service: Ophthalmology;  Laterality: Right;  [place]  00:20 CDE 00.82 Fluid pack lot # [id number]   CATARACT EXTRACTION W/PHACO Left 10/17/2018   Procedure: CATARACT EXTRACTION PHACO AND INTRAOCULAR LENS PLACEMENT (IOC)  LEFT;  Surgeon: Nevada Crane, MD;  Location: [facility];  Service: Ophthalmology;  Laterality: Left;  diabetic - diet controlled   CESAREAN SECTION     CHOLECYSTECTOMY     COLONOSCOPY WITH PROPOFOL N/A 04/05/2017   Procedure: COLONOSCOPY WITH  PROPOFOL;  Surgeon: Christena Deem, MD;  Location: [facility] ENDOSCOPY;  Service: Endoscopy;  Laterality: N/A;   ESOPHAGOGASTRODUODENOSCOPY (EGD) WITH PROPOFOL N/A 08/28/2016   Procedure: ESOPHAGOGASTRODUODENOSCOPY (EGD) WITH PROPOFOL;  Surgeon: Christena Deem, MD;  Location: [facility] ENDOSCOPY;  Service: Endoscopy;  Laterality: N/A;   ESOPHAGOGASTRODUODENOSCOPY (EGD) WITH PROPOFOL N/A 11/08/2020   Procedure: ESOPHAGOGASTRODUODENOSCOPY (EGD) WITH PROPOFOL w// biopsy;  Surgeon: Midge Minium, MD;  Location: [facility];  Service: Endoscopy;  Laterality: N/A;  Pre-Diabetic   HERNIA REPAIR     INGUINAL HERNIA REPAIR     rt and left   lexiscan cardiolite     TILT TABLE STUDY N/A 04/01/2012   Procedure: TILT TABLE STUDY;  Surgeon: [facility] Salvia, MD;  Location: [facility] CATH LAB;  Service: Cardiovascular;  Laterality: N/A;   Social History   Socioeconomic History   Marital status: Married    Spouse name: Not on file   Number of children: Not on file   Years of education: Not on file   Highest education level: Not on file  Occupational History   Occupation: disabled  Tobacco Use   Smoking status: Former    Types: Cigarettes    Quit date: 2018    Years since quitting: 4.6   Smokeless tobacco: Never  Vaping Use   Vaping Use: Never used  Substance and Sexual Activity   Alcohol use: No   Drug use: No   Sexual activity: Yes  Other Topics Concern   Not on file  Social History Narrative   Not on file   Social Determinants of Health   Financial Resource Strain: Not on file  Food Insecurity: Not on file  Transportation Needs: Not on file  Physical Activity: Not on file  Stress: Not on file  Social Connections: Not on file   Allergies  Allergen Reactions   Cleocin [Clindamycin Hcl] Other (See Comments)    GI distress   Family History  Problem Relation Age of Onset   Colon cancer Father    Stomach cancer Father    Hypertension Mother    Hyperlipidemia Mother    Stroke Mother     Alcohol abuse Brother    Irritable bowel syndrome Sister    Breast cancer Paternal Aunt    Ovarian cancer Paternal Aunt    Cancer Cousin    Cancer Cousin    Ovarian cancer Cousin    Kidney cancer Neg Hx    Kidney disease Neg Hx    Prostate cancer Neg Hx     Current Outpatient Medications (Endocrine & Metabolic):    levothyroxine (SYNTHROID) 100 MCG tablet, TAKE 1 TABLET EVERY DAY ON EMPTY STOMACHWITH A GLASS OF WATER AT LEAST 30-60 MINBEFORE BREAKFAST (Patient taking differently: Take 100 mcg by mouth daily before breakfast.)  Current Outpatient Medications (Cardiovascular):    atorvastatin (LIPITOR) 40  MG tablet, Take 40 mg by mouth at bedtime. Prescribed by PCP   furosemide (LASIX) 20 MG tablet, Take 1 tablet (20 mg total) by mouth daily as needed. Prescribed by PCP     Current Outpatient Medications (Other):    ALPRAZolam (XANAX XR) 1 MG 24 hr tablet, Take 1 mg by mouth daily. Dr. Evelene Croon   ALPRAZolam Prudy Feeler) 1 MG tablet, Take 1 mg by mouth at bedtime as needed for anxiety.   amphetamine-dextroamphetamine (ADDERALL XR) 30 MG 24 hr capsule, Take 30 mg by mouth 2 (two) times daily.   benztropine (COGENTIN) 1 MG tablet, Take 1 mg by mouth 2 (two) times daily. Dr. Evelene Croon   Cholecalciferol (VITAMIN D3) 50 MCG (2000 UT) capsule, Take 2,000 Units by mouth daily.   Docusate Sodium (DSS) 100 MG CAPS, Take 100 mg by mouth daily.    DULoxetine (CYMBALTA) 60 MG capsule, Take 60 mg by mouth daily. Dr. Evelene Croon   gabapentin (NEURONTIN) 300 MG capsule, Take 1 capsule by mouth in the morning and at bedtime.   linaclotide (LINZESS) 290 MCG CAPS capsule, Take 1 capsule (290 mcg total) by mouth daily before breakfast. (Patient taking differently: Take 290 mcg by mouth daily as needed.)   Melatonin 10 MG TABS, Take 10 mg by mouth at bedtime.   Multiple Vitamin (MULTIVITAMIN) capsule, Take by mouth.   Omega-3 300 MG CAPS, Take 1.2 g by mouth daily.    pantoprazole (PROTONIX) 40 MG tablet, Take 1 tablet  (40 mg total) by mouth 2 (two) times daily. (Patient taking differently: Take 40 mg by mouth daily.)   primidone (MYSOLINE) 50 MG tablet, Take 1 tablet (50 mg total) by mouth 2 (two) times daily at 10 AM and 5 PM AND 2 tablets (100 mg total) at bedtime.   rOPINIRole (REQUIP) 1 MG tablet, Take 1.5 mg by mouth at bedtime. Dr. Meredeth Ide   topiramate (TOPAMAX) 200 MG tablet, TAKE 1 TABLET BY MOUTH IN  THE MORNING AND 1 TABLET BY MOUTH IN THE EVENING   traZODone (DESYREL) 100 MG tablet, Take 150-200 mg by mouth at bedtime. Dr Evelene Croon   ziprasidone (GEODON) 60 MG capsule, Take 120 mg by mouth every evening. Dr. Evelene Croon   Reviewed prior external information including notes and imaging from  primary care provider As well as notes that were available from care everywhere and other healthcare systems.  Past medical history, social, surgical and family history all reviewed in electronic medical record.  No pertanent information unless stated regarding to the chief complaint.   Review of Systems:  No headache, visual changes, nausea, vomiting, diarrhea, constipation, , abdominal pain, skin rash, fevers, chills, night sweats, weight loss, swollen lymph nodes chest pain, shortness of breath, mood changes. POSITIVE muscle aches dizziness, body aches  Objective  Blood pressure 110/78, pulse 98, height 5\' 4"  (1.626 m), weight 163 lb (73.9 kg), SpO2 98 %.   General:  dressed appropriately.  Tangential thought process with flat affect sitting in a wheelchair accompanied with his significant other HEENT: Pupils equal, extraocular movements intact  Respiratory: Patient's speak in full sentences and does not appear short of breath  Cardiovascular: No lower extremity edema, non tender, no erythema  Gait patient is in a wheelchair in the did not do the balance testing because patient was concerned. MSK: Neck exam showsShows patient does have some loss of lordosis.  Patient does have some limited sidebending to the right.   Patient does have radicular symptoms consistent with the C5-C6 distribution.  Mild weakness noted compared to the contralateral side. Low back exam does have some mild loss of lordosis.  Positive straight leg test to 20 degrees of forward flexion on the right side.  Radicular symptoms in the L5 correspondence.  Patient does have weakness also noted with mild dorsiflexion of the foot as well as some mild hip flexion.  Deep tendon reflexes are intact.     Impression and Recommendations:     The above documentation has been reviewed and is accurate and complete Judi Saa, DO

## 2021-05-27 ENCOUNTER — Encounter: Payer: Self-pay | Admitting: Family Medicine

## 2021-05-27 ENCOUNTER — Other Ambulatory Visit: Payer: Self-pay

## 2021-05-27 ENCOUNTER — Ambulatory Visit: Payer: Medicare Other | Admitting: Family Medicine

## 2021-05-27 ENCOUNTER — Ambulatory Visit (INDEPENDENT_AMBULATORY_CARE_PROVIDER_SITE_OTHER): Payer: Medicare Other

## 2021-05-27 VITALS — BP 110/78 | HR 98 | Ht 64.0 in | Wt 163.0 lb

## 2021-05-27 DIAGNOSIS — M545 Low back pain, unspecified: Secondary | ICD-10-CM | POA: Diagnosis not present

## 2021-05-27 DIAGNOSIS — M542 Cervicalgia: Secondary | ICD-10-CM

## 2021-05-27 DIAGNOSIS — M2550 Pain in unspecified joint: Secondary | ICD-10-CM

## 2021-05-27 DIAGNOSIS — M791 Myalgia, unspecified site: Secondary | ICD-10-CM

## 2021-05-27 DIAGNOSIS — M5416 Radiculopathy, lumbar region: Secondary | ICD-10-CM

## 2021-05-27 LAB — VITAMIN B12: Vitamin B-12: 1097 pg/mL — ABNORMAL HIGH (ref 211–911)

## 2021-05-27 LAB — COMPREHENSIVE METABOLIC PANEL
ALT: 14 U/L (ref 0–35)
AST: 14 U/L (ref 0–37)
Albumin: 4.2 g/dL (ref 3.5–5.2)
Alkaline Phosphatase: 50 U/L (ref 39–117)
BUN: 16 mg/dL (ref 6–23)
CO2: 24 mEq/L (ref 19–32)
Calcium: 9.6 mg/dL (ref 8.4–10.5)
Chloride: 105 mEq/L (ref 96–112)
Creatinine, Ser: 1.13 mg/dL (ref 0.40–1.20)
GFR: 55.25 mL/min — ABNORMAL LOW (ref >60.00)
Glucose, Bld: 109 mg/dL — ABNORMAL HIGH (ref 70–99)
Potassium: 3.7 mEq/L (ref 3.5–5.1)
Sodium: 137 mEq/L (ref 135–145)
Total Bilirubin: 0.3 mg/dL (ref 0.2–1.2)
Total Protein: 6.9 g/dL (ref 6.0–8.3)

## 2021-05-27 LAB — CBC WITH DIFFERENTIAL/PLATELET
Basophils Absolute: 0.1 10*3/uL (ref 0.0–0.1)
Basophils Relative: 2.1 % (ref 0.0–3.0)
Eosinophils Absolute: 0.3 10*3/uL (ref 0.0–0.7)
Eosinophils Relative: 6 % — ABNORMAL HIGH (ref 0.0–5.0)
HCT: 41.7 % (ref 36.0–46.0)
Hemoglobin: 13.9 g/dL (ref 12.0–15.0)
Lymphocytes Relative: 30.2 % (ref 12.0–46.0)
Lymphs Abs: 1.6 10*3/uL (ref 0.7–4.0)
MCHC: 33.2 g/dL (ref 30.0–36.0)
MCV: 89.2 fl (ref 78.0–100.0)
Monocytes Absolute: 0.3 10*3/uL (ref 0.1–1.0)
Monocytes Relative: 5.6 % (ref 3.0–12.0)
Neutro Abs: 2.9 10*3/uL (ref 1.4–7.7)
Neutrophils Relative %: 56.1 % (ref 43.0–77.0)
Platelets: 209 10*3/uL (ref 150.0–400.0)
RBC: 4.68 Mil/uL (ref 3.87–5.11)
RDW: 13.4 % (ref 11.5–15.5)
WBC: 5.2 10*3/uL (ref 4.0–10.5)

## 2021-05-27 LAB — FERRITIN: Ferritin: 18.3 ng/mL (ref 10.0–291.0)

## 2021-05-27 LAB — URIC ACID: Uric Acid, Serum: 4.9 mg/dL (ref 2.4–7.0)

## 2021-05-27 LAB — VITAMIN D 25 HYDROXY (VIT D DEFICIENCY, FRACTURES): VITD: 30.25 ng/mL (ref 30.00–100.00)

## 2021-05-27 LAB — SEDIMENTATION RATE: Sed Rate: 6 mm/hr (ref 0–30)

## 2021-05-27 NOTE — Assessment & Plan Note (Signed)
New onset.  Patient was having difficulty with the neck.  He does have radicular symptoms that is consistent with a potential nerve root.  Patient has had increasing weakness and he states instability.  Patient has had significant work-up for this vestibular migraines as well as with cardiology and is going to be seeing pulmonology with the history of sarcoidosis.  Laboratory work-up ordered today but I do feel that advanced imaging for the lumbar spine is warranted with this being such an acute change.  Patient will have the x-rays today and then the injection MRI to further evaluate.  Follow-up after imaging to discuss

## 2021-05-27 NOTE — Patient Instructions (Addendum)
Epidural cervical [id number] Xray lumbar- here today MRI lumbar spine [phone] Labs-here today See me again 3-4 weeks after injection (30 min appt)

## 2021-05-27 NOTE — Assessment & Plan Note (Signed)
Significant myalgias.  Likely Seems to be multifactorial.  History of hypercalcemia as well as sarcoidosis.  We will get laboratory work-up to see if anything is contributing.  In addition to this patient is on the Cymbalta.  We will continue the same dose and patient will continue with her primary care physician.  Depending on laboratory work-up we will see if anything else is necessary.

## 2021-05-28 LAB — PTH, INTACT AND CALCIUM
Calcium: 9.6 mg/dL (ref 8.6–10.4)
PTH: 42 pg/mL (ref 16–77)

## 2021-05-28 LAB — CALCIUM, IONIZED: Calcium, Ion: 5.44 mg/dL (ref 4.8–5.6)

## 2021-05-28 LAB — ANGIOTENSIN CONVERTING ENZYME: Angiotensin-Converting Enzyme: 55 U/L (ref 9–67)

## 2021-05-30 ENCOUNTER — Other Ambulatory Visit: Payer: Self-pay

## 2021-05-30 ENCOUNTER — Ambulatory Visit: Payer: Medicare Other | Admitting: Pulmonary Disease

## 2021-05-30 ENCOUNTER — Encounter: Payer: Self-pay | Admitting: Pulmonary Disease

## 2021-05-30 ENCOUNTER — Ambulatory Visit: Payer: Medicare Other

## 2021-05-30 VITALS — BP 90/68 | HR 88 | Temp 98.0°F | Ht 64.0 in | Wt 163.6 lb

## 2021-05-30 DIAGNOSIS — D869 Sarcoidosis, unspecified: Secondary | ICD-10-CM

## 2021-05-30 DIAGNOSIS — R0602 Shortness of breath: Secondary | ICD-10-CM

## 2021-05-30 DIAGNOSIS — R079 Chest pain, unspecified: Secondary | ICD-10-CM | POA: Diagnosis not present

## 2021-05-30 DIAGNOSIS — N183 Chronic kidney disease, stage 3 unspecified: Secondary | ICD-10-CM | POA: Diagnosis not present

## 2021-05-30 MED ORDER — NYSTATIN 100000 UNIT/ML MT SUSP: 5.0000 mL | Freq: Four times a day (QID) | OROMUCOSAL | 0 refills | Status: AC

## 2021-05-30 NOTE — Patient Instructions (Addendum)
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 Previously on for 1 year in 2015. - Arrange for pulmonary function tests at next visit. - Recommend annual ophthalmology exam.  Last visit on 07/2020 - EKG ordered today. Unchanged RBBB. If pulmonary work-up neg, will pursue cardiac imaging +/- referral - Reviewed CBC and CMP. Stage III CKD. Refer to Nephrology  - CXR at next visit  Follow-up with me in 1 month

## 2021-05-30 NOTE — Progress Notes (Signed)
Subjective:   PATIENT ID: Jennifer QuamMichele F Beilfuss GENDER: female DOB: 11/06/1965, MRN: [id number]   HPI  Chief Complaint  Patient presents with   Consult    Sarcoidosis since 2017. Chest pains and some Sob for couple months.    Reason for Visit: New consult for sarcoidosis  55 year old female with history of sarcoidosis who presents as a new patient for persistent shortness of breath and chest pain for two months. Husband present and provides additional history.  She was diagnosed with sarcoidosis in 2015. She had initially presented to the [facility] ED for kidney stones and chest imaging concerning for lung nodules. She underwent bronchoscopy and which confirmed findings consistent with sarcoid. She was previously seen in [facility] in Pulmonary clinic with Dr. Meredeth IdeFleming. Her last PFT was two years ago and CXR 1 year ago.   She had bilateral cataract surgery in 2019 and last eye exam in Oct/Nov 2021.   She reports shortness of breath with exertion. Able to walk 3/4 mile daily at a slow pace. Denies wheezing or cough.  Social History: Quit in 2018. Smoked 1 ppd x 4 years.  Environmental exposures:  Retail  I have personally reviewed patient's past medical/family/social history, allergies, current medications.  Past Medical History:  Diagnosis Date   Acid reflux    Anemia    Anxiety    Bipolar 1 disorder, manic, moderate (HCC) 09/19/2019   Bipolar depression (HCC) 05/12/2015   Bipolar disorder (HCC)    Bipolar disorder (HCC) 05/12/2015   Bleach ingestion 06/27/2020   Cataract    Chronic kidney disease    STAGE 3   Complication of anesthesia    Felt cataract procedures   Depression    Diabetes mellitus    NIDD   Essential tremor    Family history of adverse reaction to anesthesia    Mother - slow to wake   Family history of breast cancer    doesn't meet medicare guidelines for cancer genetic testing.    Family history of ovarian cancer    Pt doesn't meet Medicare  genetic testing guidelines   Hyperlipidemia    Hypothyroid    IBS (irritable bowel syndrome)    Injury by caustic substances, except poisoning, undetermined whether accidentally or purposely inflicted    Migraine headache    vestibular migraine   Obesity    Osteopenia    Restless leg syndrome    Sarcoidosis    Self-inflicted laceration of left wrist (HCC) 09/19/2019   Sleep apnea    Syncope \     Family History  Problem Relation Age of Onset   Colon cancer Father    Stomach cancer Father    Hypertension Mother    Hyperlipidemia Mother    Stroke Mother    Alcohol abuse Brother    Irritable bowel syndrome Sister    Breast cancer Paternal Aunt    Ovarian cancer Paternal Aunt    Cancer Cousin    Cancer Cousin    Ovarian cancer Cousin    Kidney cancer Neg Hx    Kidney disease Neg Hx    Prostate cancer Neg Hx      Social History   Occupational History   Occupation: disabled  Tobacco Use   Smoking status: Former    Types: Cigarettes    Start date: 2011    Quit date: 2018    Years since quitting: 4.6   Smokeless tobacco: Never  Vaping Use   Vaping Use: Never used  Substance and Sexual Activity   Alcohol use: No   Drug use: No   Sexual activity: Yes    Allergies  Allergen Reactions   Cleocin [Clindamycin Hcl] Other (See Comments)    GI distress     Outpatient Medications Prior to Visit  Medication Sig Dispense Refill   ALPRAZolam (XANAX XR) 1 MG 24 hr tablet Take 1 mg by mouth daily. Dr. Evelene Croon     ALPRAZolam Prudy Feeler) 1 MG tablet Take 1 mg by mouth at bedtime as needed for anxiety.     amphetamine-dextroamphetamine (ADDERALL XR) 30 MG 24 hr capsule Take 30 mg by mouth 2 (two) times daily.     atorvastatin (LIPITOR) 40 MG tablet Take 40 mg by mouth at bedtime. Prescribed by PCP     benztropine (COGENTIN) 1 MG tablet Take 1 mg by mouth 2 (two) times daily. Dr. Evelene Croon     Cholecalciferol (VITAMIN D3) 50 MCG (2000 UT) capsule Take 2,000 Units by mouth daily.      Docusate Sodium (DSS) 100 MG CAPS Take 100 mg by mouth daily.      DULoxetine (CYMBALTA) 60 MG capsule Take 60 mg by mouth daily. Dr. Evelene Croon     furosemide (LASIX) 20 MG tablet Take 1 tablet (20 mg total) by mouth daily as needed. Prescribed by PCP 30 tablet 5   gabapentin (NEURONTIN) 300 MG capsule Take 1 capsule by mouth in the morning and at bedtime.     levothyroxine (SYNTHROID) 100 MCG tablet TAKE 1 TABLET EVERY DAY ON EMPTY STOMACHWITH A GLASS OF WATER AT LEAST 30-60 MINBEFORE BREAKFAST (Patient taking differently: Take 100 mcg by mouth daily before breakfast.) 30 tablet 3   linaclotide (LINZESS) 290 MCG CAPS capsule Take 1 capsule (290 mcg total) by mouth daily before breakfast. (Patient taking differently: Take 290 mcg by mouth daily as needed.) 30 capsule 1   Melatonin 10 MG TABS Take 10 mg by mouth at bedtime.     Multiple Vitamin (MULTIVITAMIN) capsule Take by mouth.     Omega-3 300 MG CAPS Take 1.2 g by mouth daily.      pantoprazole (PROTONIX) 40 MG tablet Take 1 tablet (40 mg total) by mouth 2 (two) times daily. (Patient taking differently: Take 40 mg by mouth daily.) 60 tablet 5   primidone (MYSOLINE) 50 MG tablet Take 1 tablet (50 mg total) by mouth 2 (two) times daily at 10 AM and 5 PM AND 2 tablets (100 mg total) at bedtime. 360 tablet 1   rOPINIRole (REQUIP) 1 MG tablet Take 1.5 mg by mouth at bedtime. Dr. Meredeth Ide     topiramate (TOPAMAX) 200 MG tablet TAKE 1 TABLET BY MOUTH IN  THE MORNING AND 1 TABLET BY MOUTH IN THE EVENING 180 tablet 0   traZODone (DESYREL) 100 MG tablet Take 150-200 mg by mouth at bedtime. Dr Evelene Croon     ziprasidone (GEODON) 60 MG capsule Take 120 mg by mouth every evening. Dr. Evelene Croon     No facility-administered medications prior to visit.    Review of Systems  Constitutional:  Negative for chills, diaphoresis, fever, malaise/fatigue and weight loss.  HENT:  Negative for congestion, ear pain and sore throat.   Respiratory:  Positive for shortness of breath.  Negative for cough, hemoptysis, sputum production and wheezing.   Cardiovascular:  Positive for chest pain. Negative for palpitations and leg swelling.  Gastrointestinal:  Negative for abdominal pain, heartburn and nausea.  Genitourinary:  Negative for frequency.  Musculoskeletal:  Negative for  joint pain and myalgias.  Skin:  Negative for itching and rash.  Neurological:  Negative for dizziness, weakness and headaches.  Endo/Heme/Allergies:  Does not bruise/bleed easily.  Psychiatric/Behavioral:  Negative for depression. The patient is not nervous/anxious.     Objective:   Vitals:   05/30/21 0949  BP: 90/68  Pulse: 88  Temp: 98 F (36.7 C)  TempSrc: Oral  SpO2: 97%  Weight: 163 lb 9.6 oz (74.2 kg)  Height: 5\' 4"  (1.626 m)   SpO2: 97 %  Physical Exam: General: Well-appearing, no acute distress HENT: , AT, OP clear, MMM Eyes: EOMI, no scleral icterus Respiratory: Clear to auscultation bilaterally.  No crackles, wheezing or rales Cardiovascular: RRR, -M/R/G, no JVD GI: BS+, soft, nontender Extremities:-Edema,-tenderness Neuro: AAO x4, CNII-XII grossly intact Skin: Intact, no rashes or bruising Psych: Normal mood, normal affect  Data Reviewed:  Imaging: - CT Chest 05/16/14 - Mediastinal and hilar lymphadenopathy and bilateral airspace opacities associated with patchy GGO - CT Chest 03/26/17 - Stable mediastinal and hilar lymphadenopathy and bilateral airspace opacities consistent with known sarcoidosis - CT Chest 07/16/16 - Progression of pulmonary sarcoid with bilateral nodularity with upper lobe predominance - CT Chest 03/26/17 - Improved of pulmonary sarcoid even compared to CT Chest in 2015 with residual scarring in upper lobes. Adenopathy decreased.  PFT: None on file  Labs: CBC Latest Ref Rng & Units 05/27/2021 04/08/2021 06/25/2020  WBC 4.0 - 10.5 K/uL 5.2 5.6 6.4  Hemoglobin 12.0 - 15.0 g/dL 08/25/2020 76.7 20.9  Hematocrit 36.0 - 46.0 % 41.7 41.0 41.9  Platelets 150.0 -  400.0 K/uL 209.0 202 213     CMP Latest Ref Rng & Units 05/27/2021 05/27/2021 04/08/2021  Glucose 70 - 99 mg/dL - 04/10/2021) 962(E)  BUN 6 - 23 mg/dL - 16 16  Creatinine 366(Q - 1.20 mg/dL - 9.47 6.54)  Sodium 135 - 145 mEq/L - 137 142  Potassium 3.5 - 5.1 mEq/L - 3.7 4.2  Chloride 96 - 112 mEq/L - 105 105  CO2 19 - 32 mEq/L - 24 23  Calcium 8.6 - 10.4 mg/dL 9.6 9.6 9.6  Total Protein 6.0 - 8.3 g/dL - 6.9 6.2  Total Bilirubin 0.2 - 1.2 mg/dL - 0.3 6.50(P  Alkaline Phos 39 - 117 U/L - 50 64  AST 0 - 37 U/L - 14 16  ALT 0 - 35 U/L - 14 17      Assessment & Plan:   Discussion:  Pulmonary sarcoidosis - Dx in 2015 via endobronchial bx - No indication for prednisone therapy. Previously on for 1 year in 2015. - Arrange for pulmonary function tests at next visit. - Recommend annual ophthalmology exam.  Last visit on 07/2020 - EKG ordered today. Unchanged RBBB. If pulmonary work-up neg, will pursue cardiac imaging +/- referral - Reviewed CBC and CMP. Stage III CKD. Refer to Nephrology  - CXR at next visit  Shortness of breath Chest pain - EKG reviewed with no acute findings. No ST-elevation or TWI - PFTs as noted above  Health Maintenance Immunization History  Administered Date(s) Administered   Influenza,inj,Quad PF,6+ Mos 07/09/2018, 08/09/2019, 08/14/2020   Influenza-Unspecified 08/13/2017, 07/09/2018, 08/09/2019   Moderna Sars-Covid-2 Vaccination 03/06/2020, 03/31/2020   Tdap 08/07/2011, 09/18/2019   Zoster Recombinat (Shingrix) 04/18/2021   CT Lung Screen - not qualified. Insufficient smoking history  Orders Placed This Encounter  Procedures   DG Chest 2 View    Standing Status:   Future    Number of Occurrences:  1    Standing Expiration Date:   05/30/2022    Scheduling Instructions:     Prior to next visit, scheduled for 9/30 with PFT and OV.    Order Specific Question:   Reason for Exam (SYMPTOM  OR DIAGNOSIS REQUIRED)    Answer:   pulmonary sarcoidosis    Order  Specific Question:   Preferred imaging location?    Answer:   Internal    Order Specific Question:   Radiology Contrast Protocol - do NOT remove file path    Answer:   [email or web address]   Ambulatory referral to Nephrology    Referral Priority:   Routine    Referral Type:   Consultation    Referral Reason:   Specialty Services Required    Requested Specialty:   Nephrology    Number of Visits Requested:   1   EKG 12-Lead   Pulmonary Function Test    Standing Status:   Future    Standing Expiration Date:   05/30/2022    Order Specific Question:   Where should this test be performed?    Answer:   [facility]    Order Specific Question:   Full PFT: includes the following: basic spirometry, spirometry pre & post bronchodilator, diffusion capacity (DLCO), lung volumes    Answer:   Full PFT    Order Specific Question:   MIP/MEP    Answer:   No    Order Specific Question:   6 minute walk    Answer:   No    Order Specific Question:   ABG    Answer:   No    Order Specific Question:   Diffusion capacity (DLCO)    Answer:   Yes    Order Specific Question:   Lung volumes    Answer:   Yes    Order Specific Question:   Methacholine challenge    Answer:   No   Meds ordered this encounter  Medications   nystatin (MYCOSTATIN) 100000 UNIT/ML suspension    Sig: Take 5 mLs (500,000 Units total) by mouth 4 (four) times daily for 7 days.    Dispense:  140 mL    Refill:  0    Return in about 1 month (around 06/30/2021).  I have spent a total time of 45-minutes on the day of the appointment reviewing prior documentation, coordinating care and discussing medical diagnosis and plan with the patient/family. Imaging, labs and tests included in this note have been reviewed and interpreted independently by me.  Mccade Sullenberger Mechele Collin, MD  Pulmonary Critical Care 05/30/2021 9:56 AM  Office Number [phone]

## 2021-06-03 ENCOUNTER — Other Ambulatory Visit: Payer: Self-pay

## 2021-06-03 ENCOUNTER — Ambulatory Visit: Payer: Medicare Other | Admitting: Obstetrics & Gynecology

## 2021-06-03 ENCOUNTER — Encounter: Payer: Self-pay | Admitting: Obstetrics & Gynecology

## 2021-06-03 VITALS — BP 110/70 | Wt 163.0 lb

## 2021-06-03 DIAGNOSIS — R1032 Left lower quadrant pain: Secondary | ICD-10-CM

## 2021-06-03 NOTE — Progress Notes (Signed)
Gynecology Pelvic Pain Evaluation   Chief Complaint:  Chief Complaint  Patient presents with   Pelvic Pain    Left side      History of Present Illness:   Patient is a 55 y.o. [id number] who LMP was No LMP recorded. Patient has had a hysterectomy., presents today for a problem visit.  She complains of pain.   Her pain is localized to the LLQ area, described as intermittent and sharp, began  3 mos ago  and its severity is described as severe. The pain radiates to the  Non-radiating. She has these associated symptoms which include nausea. Patient has these modifiers which include relaxation that make it better and unable to associate with any factor that make it worse.  Not assoc w diet, activity, sex, any other known trigger.  Has had prior endometriosis and TAH, BSO but that has ben 20+ years ago. Context includes: spontaneous.  No recent US or CT.  No VB.  Was told she has diverticulosis on prior colonoscopy.  PMHx: She  has a past medical history of Acid reflux, Anemia, Anxiety, Bipolar 1 disorder, manic, moderate (HCC) (09/19/2019), Bipolar depression (HCC) (05/12/2015), Bipolar disorder (HCC), Bipolar disorder (HCC) (05/12/2015), Bleach ingestion (06/27/2020), Cataract, Chronic kidney disease, Complication of anesthesia, Depression, Diabetes mellitus, Essential tremor, Family history of adverse reaction to anesthesia, Family history of breast cancer, Family history of ovarian cancer, Hyperlipidemia, Hypothyroid, IBS (irritable bowel syndrome), Injury by caustic substances, except poisoning, undetermined whether accidentally or purposely inflicted, Migraine headache, Obesity, Osteopenia, Restless leg syndrome, Sarcoidosis, Self-inflicted laceration of left wrist (HCC) (09/19/2019), Sleep apnea, and Syncope (\). Also,  has a past surgical history that includes Appendectomy; Cholecystectomy; Abdominal hysterectomy; Inguinal hernia repair; lexiscan cardiolite; tilt table study (N/A, 04/01/2012); Cesarean  section; Hernia repair; Bladder surgery; Esophagogastroduodenoscopy (egd) with propofol (N/A, 08/28/2016); Colonoscopy with propofol (N/A, 04/05/2017); Cataract extraction w/PHACO (Right, 09/21/2018); Cataract extraction w/PHACO (Left, 10/17/2018); and Esophagogastroduodenoscopy (egd) with propofol (N/A, 11/08/2020)., family history includes Alcohol abuse in her brother; Breast cancer in her paternal aunt; Cancer in her cousin and cousin; Colon cancer in her father; Hyperlipidemia in her mother; Hypertension in her mother; Irritable bowel syndrome in her sister; Ovarian cancer in her cousin and paternal aunt; Stomach cancer in her father; Stroke in her mother.,  reports that she quit smoking about 4 years ago. Her smoking use included cigarettes. She started smoking about 11 years ago. She has never used smokeless tobacco. She reports that she does not drink alcohol and does not use drugs.  She has a current medication list which includes the following prescription(s): alprazolam, alprazolam, amphetamine-dextroamphetamine, atorvastatin, benztropine, vitamin d3, dss, duloxetine, furosemide, gabapentin, levothyroxine, linaclotide, melatonin, multivitamin, nystatin, omega-3, pantoprazole, primidone, ropinirole, topiramate, trazodone, and ziprasidone. Also, is allergic to cleocin [clindamycin hcl].  Review of Systems  Constitutional:  Negative for chills, fever and malaise/fatigue.  HENT:  Negative for congestion, sinus pain and sore throat.   Eyes:  Negative for blurred vision and pain.  Respiratory:  Negative for cough and wheezing.   Cardiovascular:  Negative for chest pain and leg swelling.  Gastrointestinal:  Positive for abdominal pain. Negative for constipation, diarrhea, heartburn, nausea and vomiting.  Genitourinary:  Negative for dysuria, frequency, hematuria and urgency.  Musculoskeletal:  Negative for back pain, joint pain, myalgias and neck pain.  Skin:  Negative for itching and rash.   Neurological:  Negative for dizziness, tremors and weakness.  Endo/Heme/Allergies:  Does not bruise/bleed easily.  Psychiatric/Behavioral:  Negative for depression. The patient  is not nervous/anxious and does not have insomnia.    Objective: BP 110/70   Wt 163 lb (73.9 kg)   BMI 27.98 kg/m  Physical Exam Constitutional:      General: She is not in acute distress.    Appearance: She is well-developed.  Genitourinary:     Genitourinary Comments: Cuff intact/ no lesions  Absent uterus and cervix     No vaginal erythema or bleeding.  HENT:     Head: Normocephalic and atraumatic.     Nose: Nose normal.  Abdominal:     General: There is no distension.     Palpations: Abdomen is soft.     Tenderness: There is no abdominal tenderness.  Musculoskeletal:        General: Normal range of motion.  Neurological:     Mental Status: She is alert and oriented to person, place, and time.     Cranial Nerves: No cranial nerve deficit.  Skin:    General: Skin is warm and dry.  Psychiatric:        Attention and Perception: Attention normal.        Mood and Affect: Mood normal.        Speech: Speech normal.        Behavior: Behavior normal.        Cognition and Memory: Cognition normal.        Judgment: Judgment normal.    Female chaperone present for pelvic portion of the physical exam  Assessment: 55 y.o. [id number]  1. LLQ pain - Ambulatory referral to General Surgery\ - No GYN source Consider hernia, diverticulitis, other Heat rest Tylenol  Annamarie Major, MD, [facility], [facility] 06/03/2021  4:11 PM

## 2021-06-06 ENCOUNTER — Other Ambulatory Visit: Payer: Self-pay

## 2021-06-06 ENCOUNTER — Ambulatory Visit
Admission: RE | Admit: 2021-06-06 | Discharge: 2021-06-06 | Disposition: A | Discharge status: Home / Self Care | Payer: Medicare Other | Source: Ambulatory Visit | Attending: Family Medicine | Admitting: Family Medicine

## 2021-06-06 DIAGNOSIS — M545 Low back pain, unspecified: Secondary | ICD-10-CM

## 2021-06-10 ENCOUNTER — Other Ambulatory Visit: Payer: Self-pay

## 2021-06-10 ENCOUNTER — Ambulatory Visit
Admission: RE | Admit: 2021-06-10 | Discharge: 2021-06-10 | Disposition: A | Discharge status: Home / Self Care | Payer: Medicare Other | Source: Ambulatory Visit | Attending: Family Medicine | Admitting: Family Medicine

## 2021-06-10 DIAGNOSIS — M542 Cervicalgia: Secondary | ICD-10-CM

## 2021-06-10 MED ORDER — IOPAMIDOL (ISOVUE-M 300) INJECTION 61%
1.0000 mL | Freq: Once | INTRAMUSCULAR | Status: AC
  Administered 2021-06-10: 1 mL via EPIDURAL

## 2021-06-10 MED ORDER — TRIAMCINOLONE ACETONIDE 40 MG/ML IJ SUSP (RADIOLOGY)
60.0000 mg | Freq: Once | INTRAMUSCULAR | Status: AC
  Administered 2021-06-10: 60 mg via EPIDURAL

## 2021-06-10 NOTE — Discharge Instructions (Signed)

## 2021-06-24 IMAGING — CR DG CHEST 2V
1 series · 2 of 2 positions shown · non-contrast
Comparison: Chest radiograph 10/08/2014

CLINICAL DATA: History of sarcoidosis, former smoker; fall off exam
table.

EXAM:
CHEST - 2 VIEW

[Series 1: dg chest 2 view · 0.14mm/px · 2 of 2 slices shown]
[im 1/2]
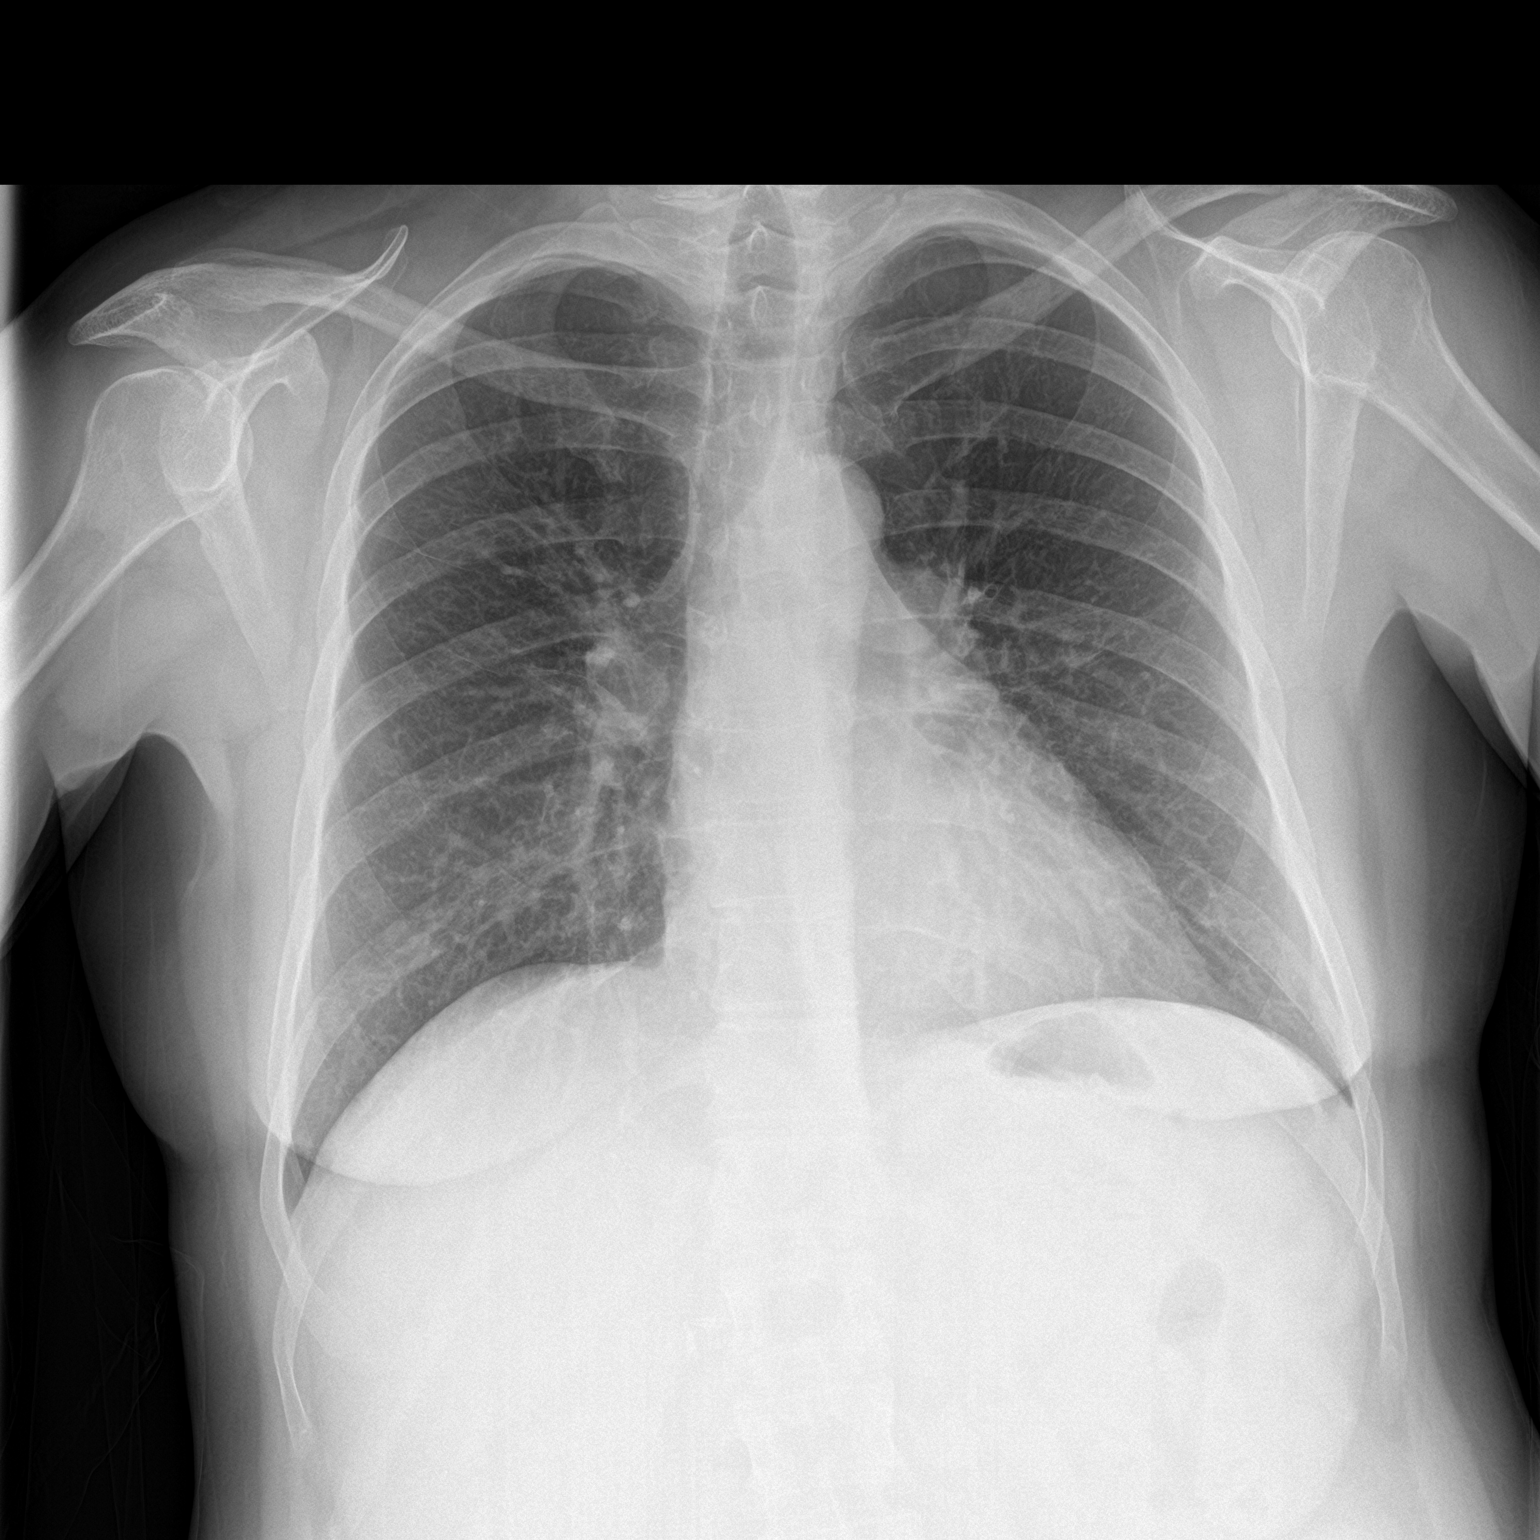
[im 2/2]
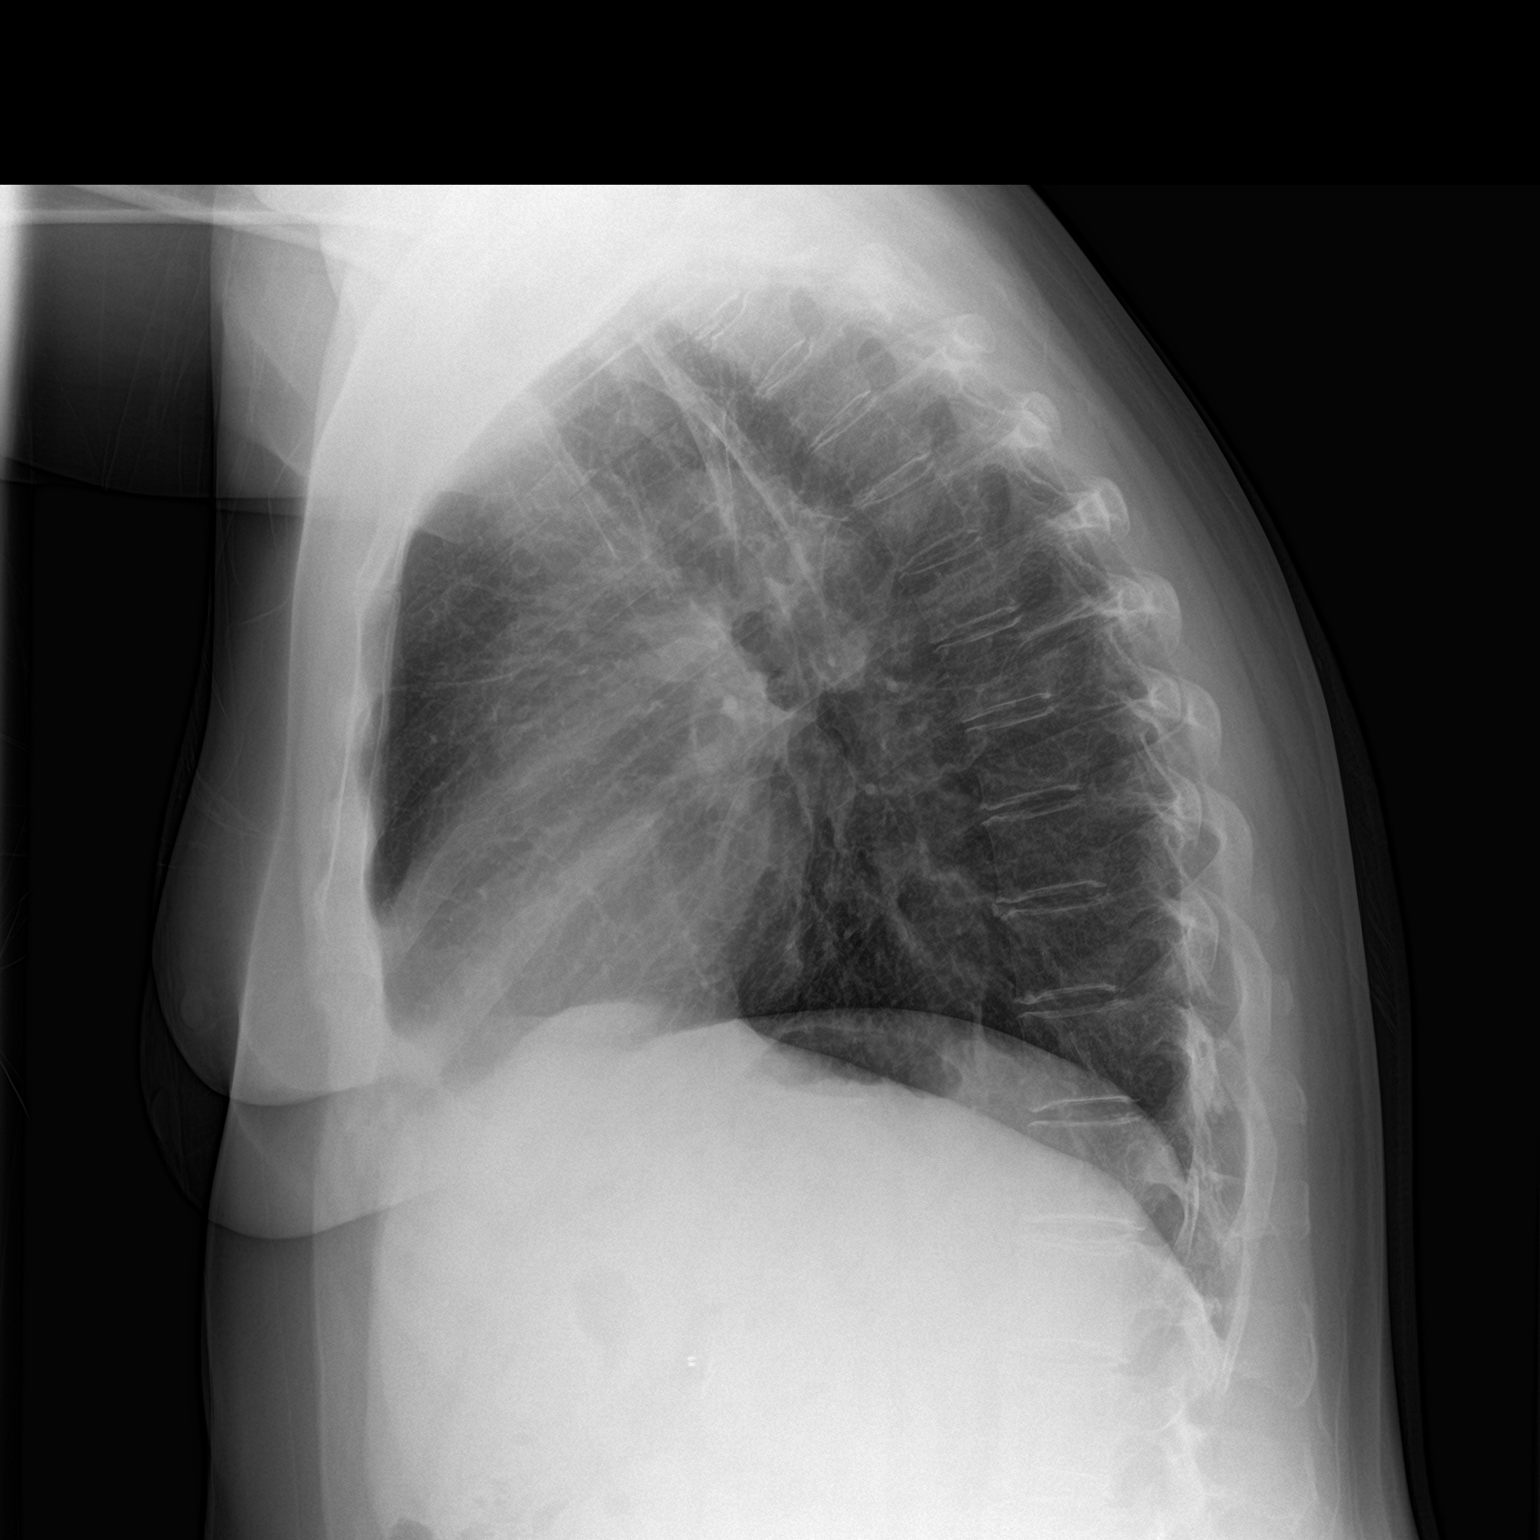

[2 of 2 positions shown; findings below may reference images not displayed]

FINDINGS: The heart size and mediastinal contours are within normal limits.
There is very mild interstitial prominence bilaterally which may be
related to history of sarcoidosis. No new confluent pulmonary
opacity. No pneumothorax or pleural effusion. The visualized
skeletal structures are unremarkable.
IMPRESSION: No acute cardiopulmonary findings.

## 2021-06-24 IMAGING — CR DG THORACIC SPINE 3V
1 series · 3 of 3 positions shown · non-contrast
Comparison: None.

CLINICAL DATA: Upper back pain after fall 2 weeks ago.

EXAM:
THORACIC SPINE - 3 VIEWS

[Series 1: dg thoracic spine w/swimmers · 0.14mm/px · 3 of 3 slices shown]
[im 1/3]
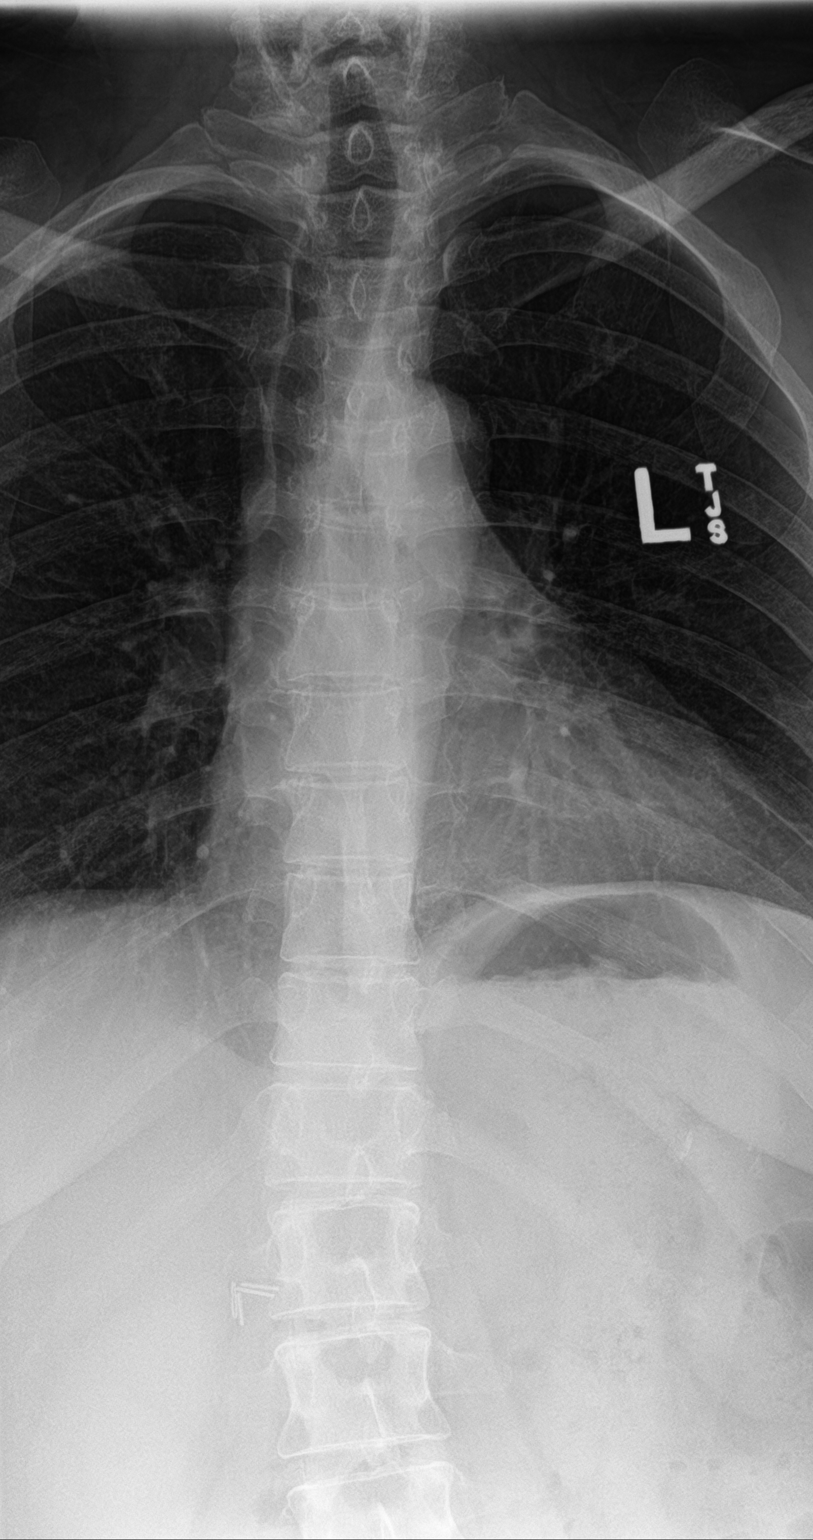
[im 2/3]
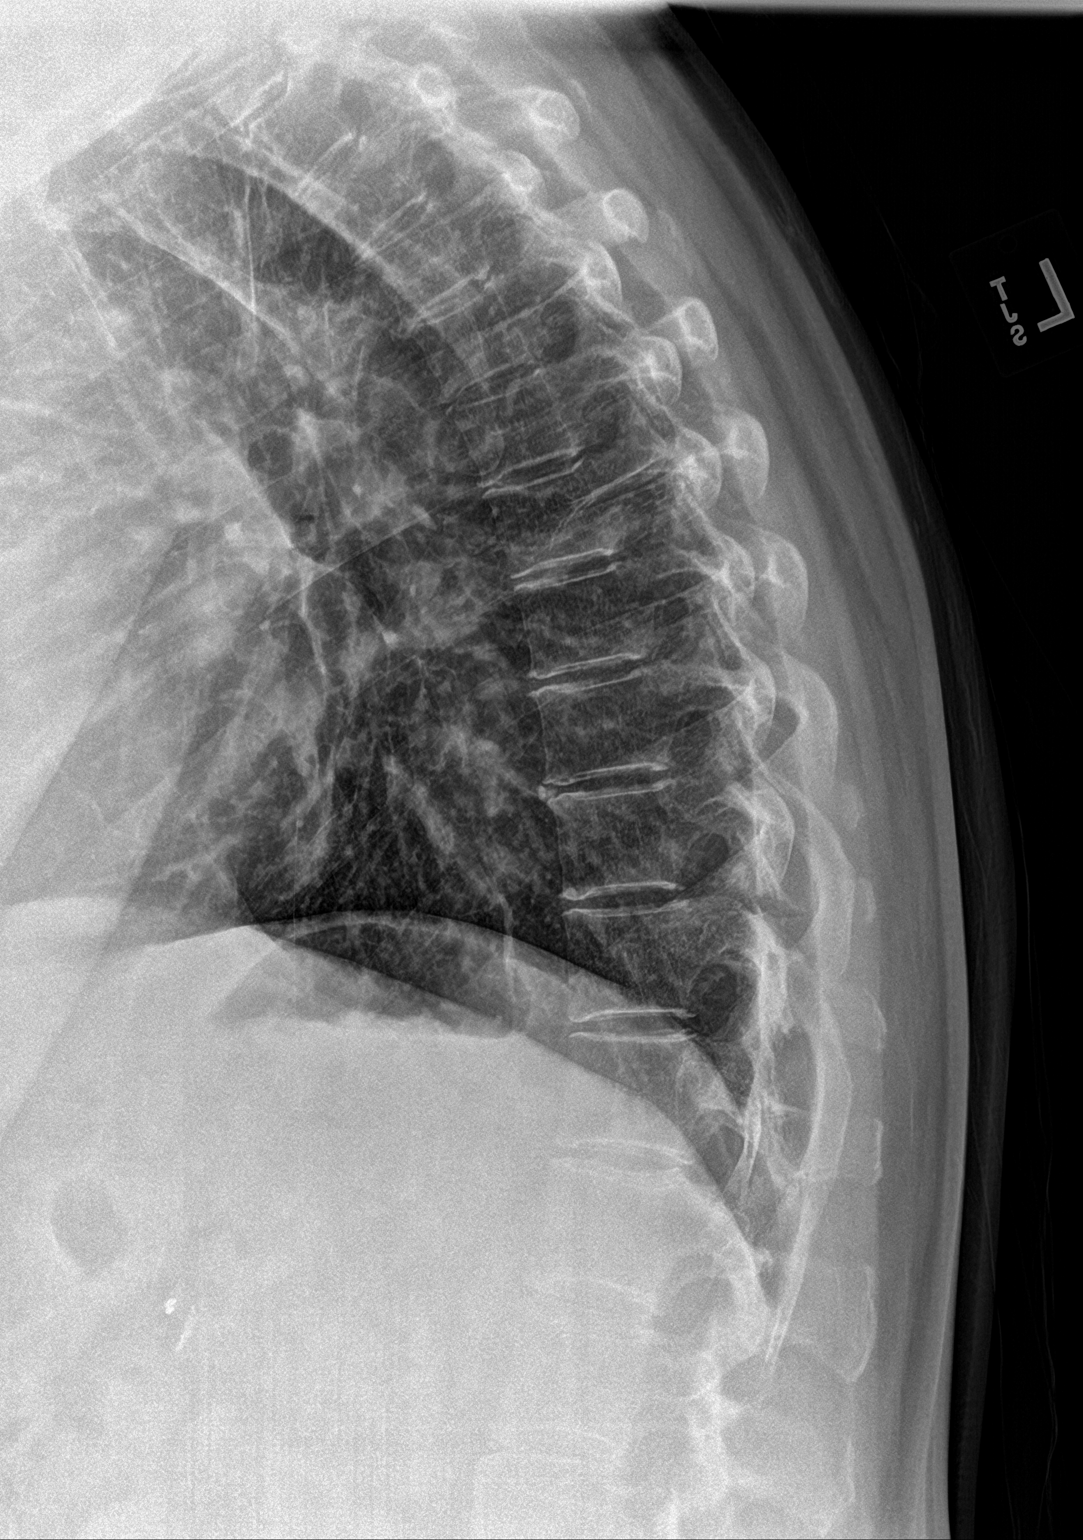
[im 3/3]
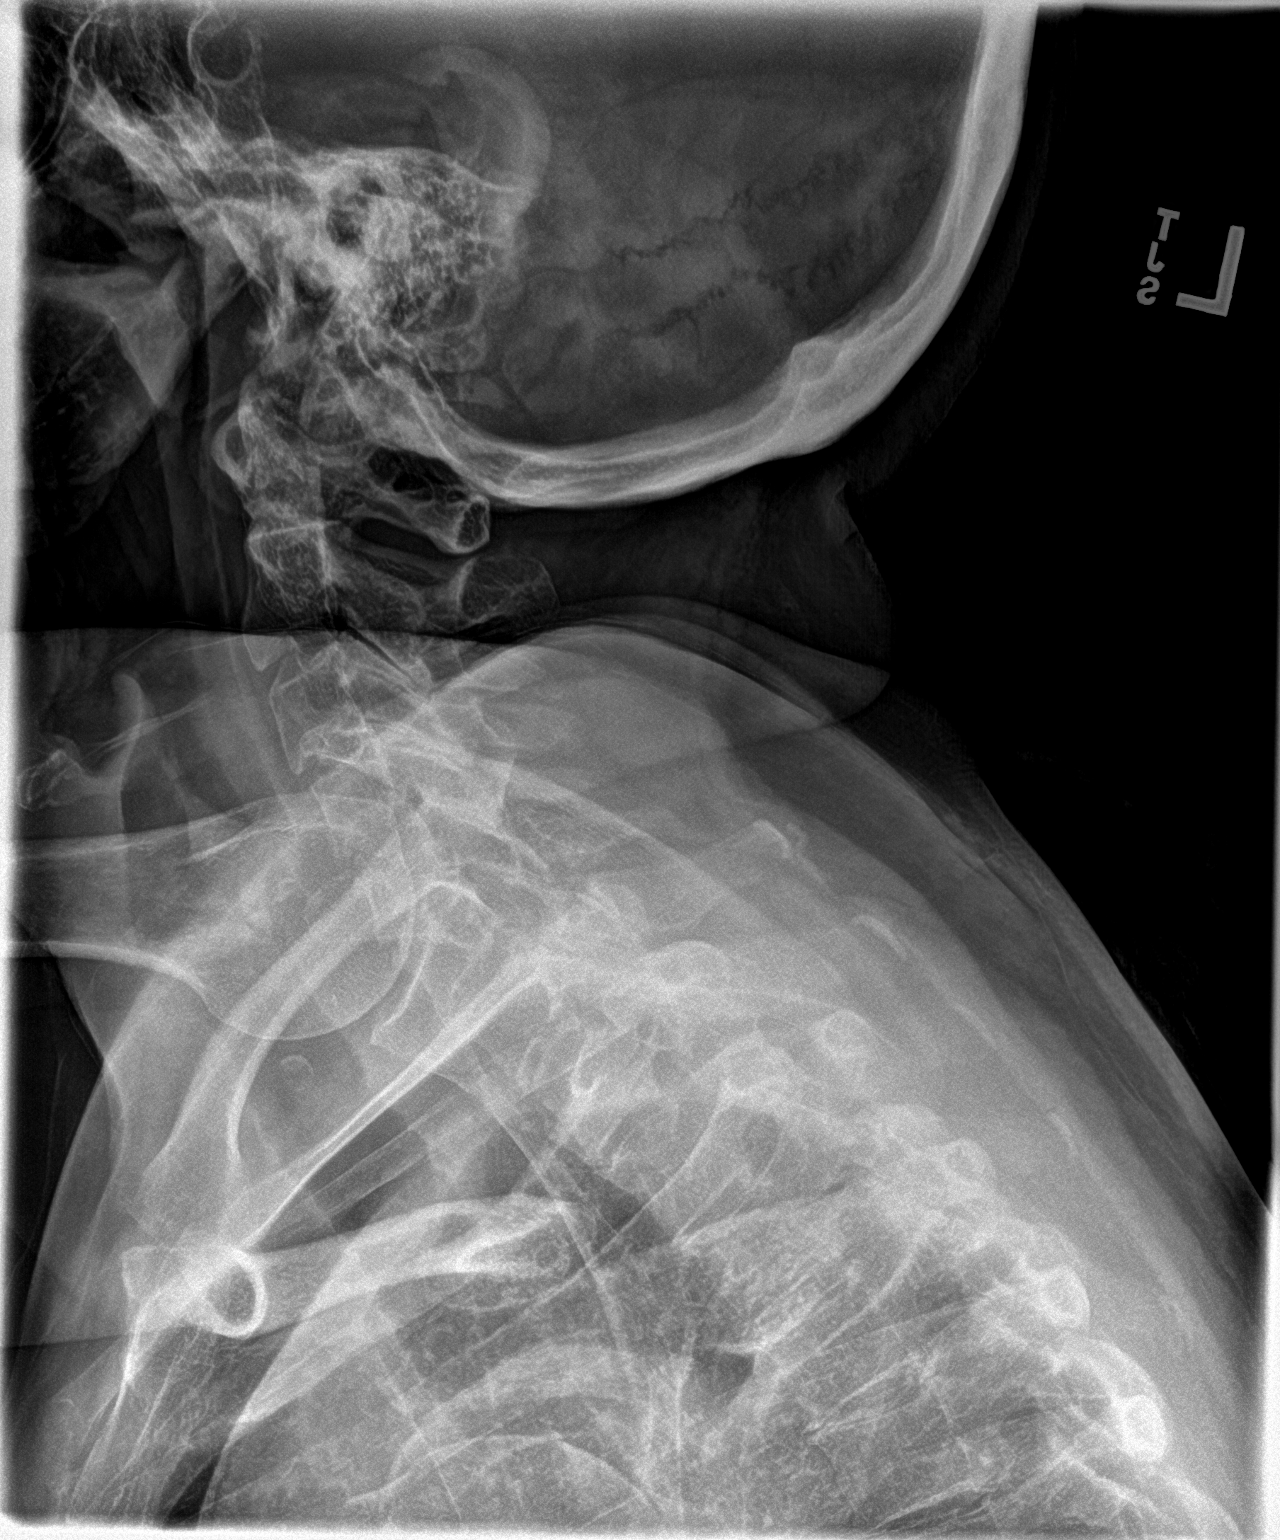

[3 of 3 positions shown; findings below may reference images not displayed]

FINDINGS: There is no evidence of thoracic spine fracture. Alignment is
normal. No other significant bone abnormalities are identified.
IMPRESSION: Negative.

## 2021-06-24 IMAGING — CR DG CERVICAL SPINE COMPLETE 4+V
1 series · 7 of 7 positions shown · non-contrast
Comparison: None.

CLINICAL DATA: Neck pain after fall 2 weeks ago.

EXAM:
CERVICAL SPINE - COMPLETE 4+ VIEW

[Series 1: dg cervical spine complete · 0.14mm/px · 7 of 7 slices shown]
[im 1/7]
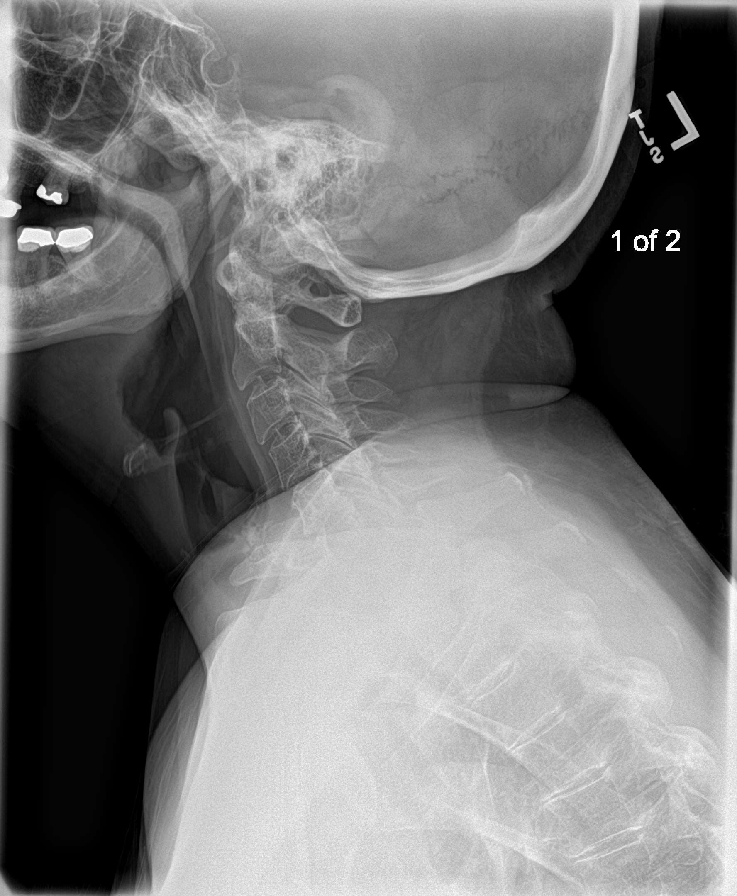
[im 2/7]
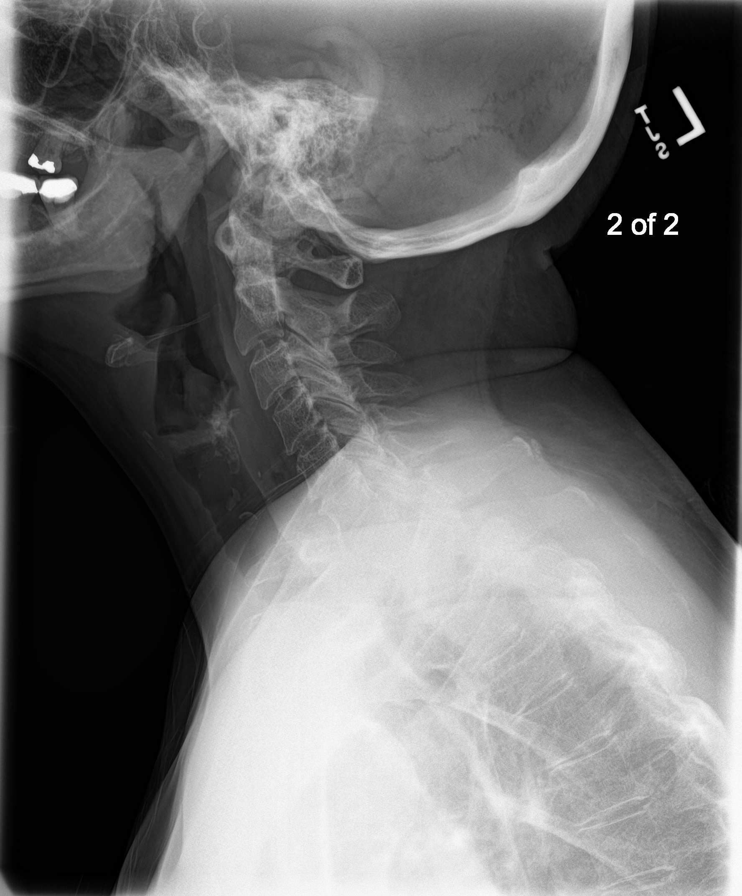
[im 3/7]
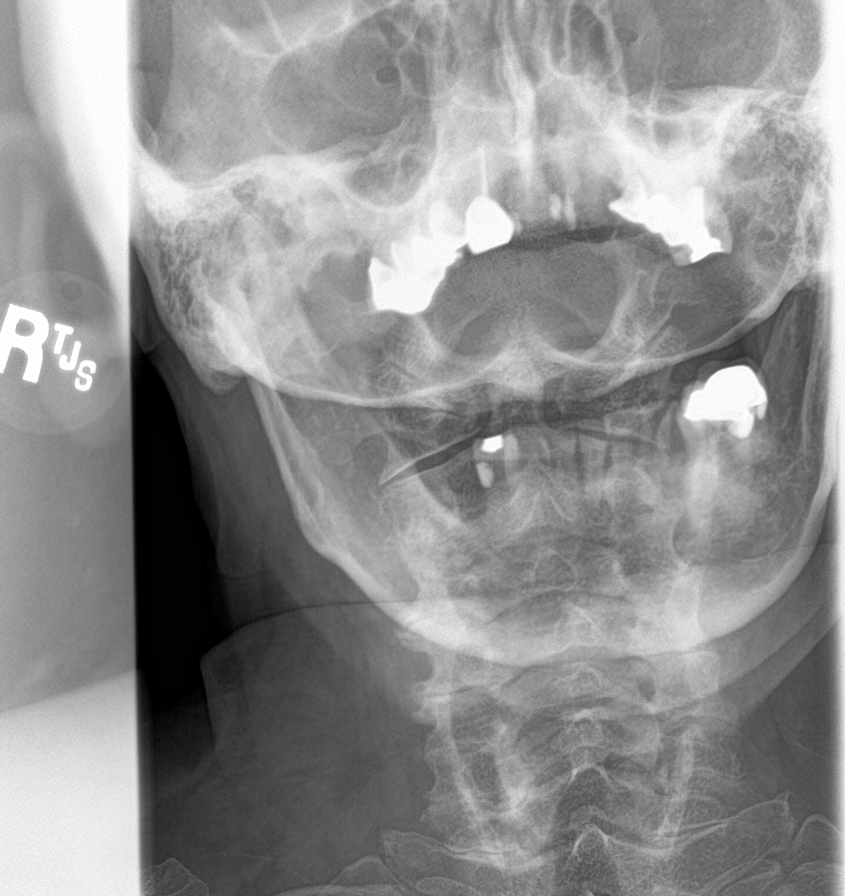
[im 4/7]
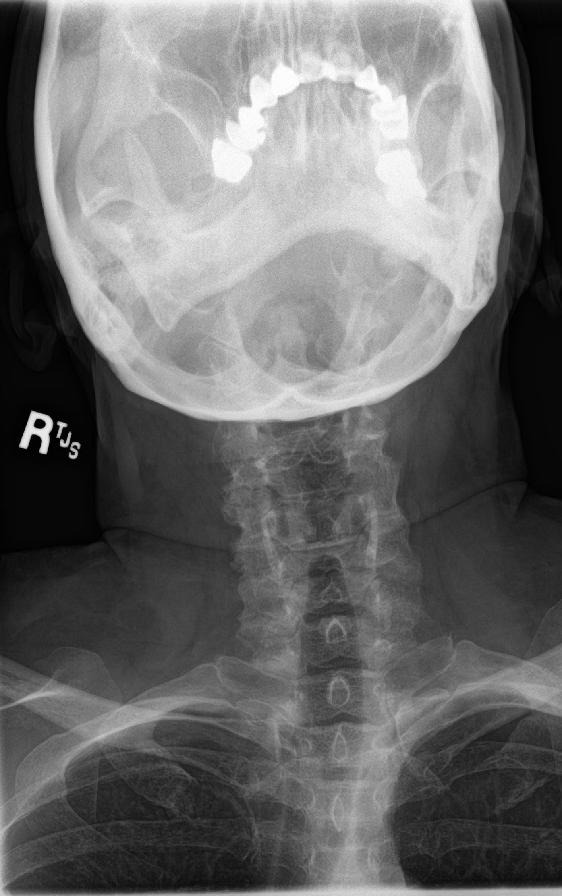
[im 5/7]
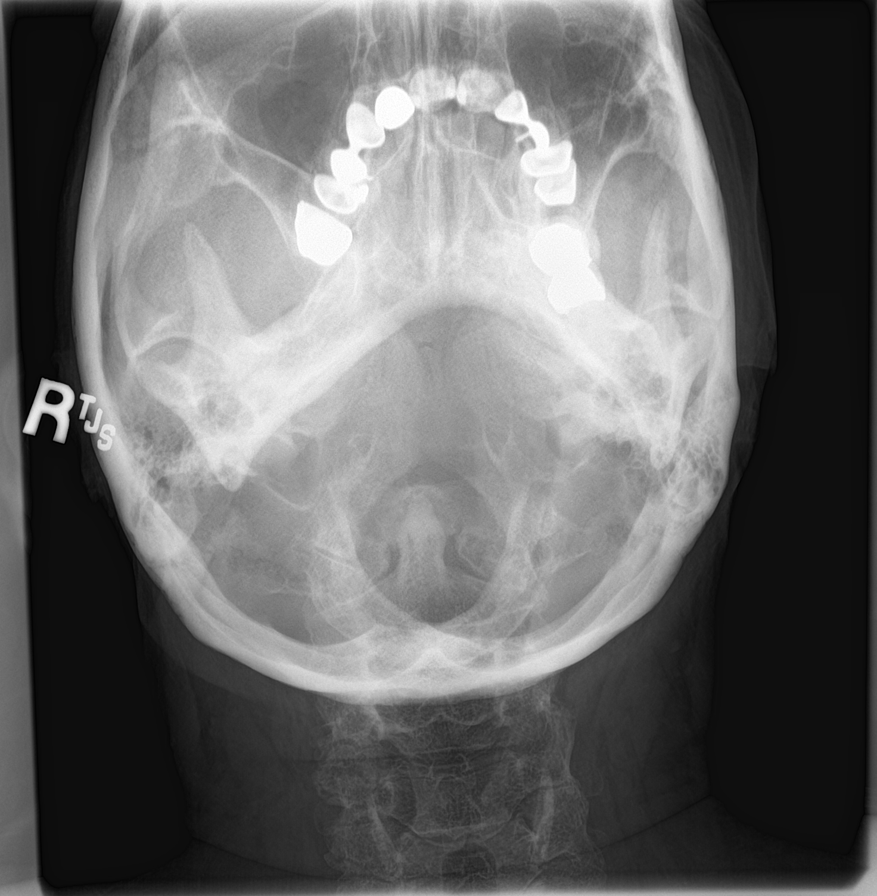
[im 6/7]
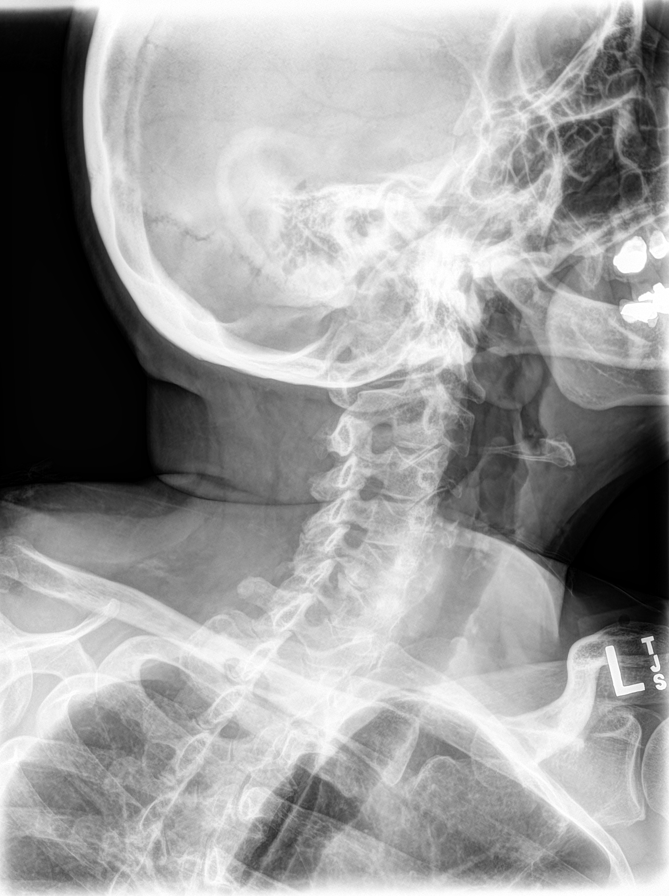
[im 7/7]
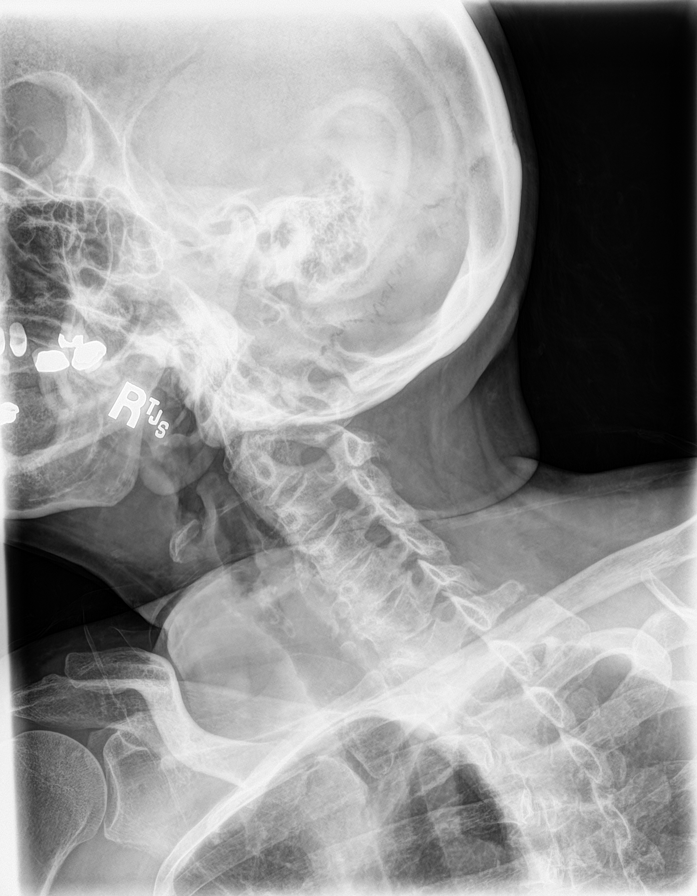

[7 of 7 positions shown; findings below may reference images not displayed]

FINDINGS: No fracture or spondylolisthesis is noted. Mild degenerative disc
disease is noted at C5-6 with anterior posterior osteophyte
formation. No prevertebral soft tissue swelling is noted. Mild
bilateral neural foraminal stenosis is noted at C5-6 secondary to
uncovertebral spurring.
IMPRESSION: Mild degenerative disc disease is noted at C5-6 with mild bilateral
neural foraminal stenosis at this level secondary to uncovertebral
spurring. No acute abnormality is noted.

## 2021-06-27 DIAGNOSIS — R413 Other amnesia: Secondary | ICD-10-CM | POA: Diagnosis not present

## 2021-06-29 ENCOUNTER — Encounter: Payer: Self-pay | Admitting: Pulmonary Disease

## 2021-06-29 DIAGNOSIS — R0602 Shortness of breath: Secondary | ICD-10-CM | POA: Insufficient documentation

## 2021-06-30 DIAGNOSIS — G4710 Hypersomnia, unspecified: Secondary | ICD-10-CM | POA: Diagnosis not present

## 2021-06-30 DIAGNOSIS — R519 Headache, unspecified: Secondary | ICD-10-CM | POA: Diagnosis not present

## 2021-06-30 DIAGNOSIS — G2581 Restless legs syndrome: Secondary | ICD-10-CM | POA: Diagnosis not present

## 2021-06-30 DIAGNOSIS — G4700 Insomnia, unspecified: Secondary | ICD-10-CM | POA: Diagnosis not present

## 2021-06-30 DIAGNOSIS — R413 Other amnesia: Secondary | ICD-10-CM | POA: Diagnosis not present

## 2021-07-01 DIAGNOSIS — E1165 Type 2 diabetes mellitus with hyperglycemia: Secondary | ICD-10-CM | POA: Diagnosis not present

## 2021-07-01 DIAGNOSIS — I129 Hypertensive chronic kidney disease with stage 1 through stage 4 chronic kidney disease, or unspecified chronic kidney disease: Secondary | ICD-10-CM | POA: Diagnosis not present

## 2021-07-01 DIAGNOSIS — R809 Proteinuria, unspecified: Secondary | ICD-10-CM | POA: Diagnosis not present

## 2021-07-01 DIAGNOSIS — E1121 Type 2 diabetes mellitus with diabetic nephropathy: Secondary | ICD-10-CM | POA: Diagnosis not present

## 2021-07-01 DIAGNOSIS — N1831 Chronic kidney disease, stage 3a: Secondary | ICD-10-CM | POA: Diagnosis not present

## 2021-07-02 ENCOUNTER — Ambulatory Visit: Payer: Medicare Other

## 2021-07-02 ENCOUNTER — Other Ambulatory Visit: Payer: Self-pay | Admitting: Internal Medicine

## 2021-07-02 DIAGNOSIS — R569 Unspecified convulsions: Secondary | ICD-10-CM

## 2021-07-02 NOTE — Telephone Encounter (Signed)
Requested medication (s) are due for refill today - yes  Requested medication (s) are on the active medication list -yes  Future visit scheduled -yes  Last refill: 01/21/21 #360 1RF  Notes to clinic: Request RF: non delegated Rx   Requested Prescriptions  Pending Prescriptions Disp Refills   primidone (MYSOLINE) 50 MG tablet [Pharmacy Med Name: PRIMIDONE  50MG   TAB] 360 tablet 3    Sig: TAKE 1 TABLET BY MOUTH  TWICE DAILY AT 10 AM AND 5  PM AND 2 TABLETS BY MOUTH  AT BEDTIME     Not Delegated - Neurology:  Anticonvulsants Failed - 07/02/2021  8:42 AM      Failed - This refill cannot be delegated      Passed - HCT in normal range and within 360 days    HCT  Date Value Ref Range Status  05/27/2021 41.7 36.0 - 46.0 % Final   Hematocrit  Date Value Ref Range Status  04/08/2021 41.0 34.0 - 46.6 % Final          Passed - HGB in normal range and within 360 days    Hemoglobin  Date Value Ref Range Status  05/27/2021 13.9 12.0 - 15.0 g/dL Final  07/27/2021 76/19/5093 11.1 - 15.9 g/dL Final          Passed - PLT in normal range and within 360 days    Platelets  Date Value Ref Range Status  05/27/2021 209.0 150.0 - 400.0 K/uL Final  04/08/2021 202 150 - 450 x10E3/uL Final          Passed - WBC in normal range and within 360 days    WBC  Date Value Ref Range Status  05/27/2021 5.2 4.0 - 10.5 K/uL Final          Passed - Valid encounter within last 12 months    Recent Outpatient Visits           2 months ago Hypothyroidism, unspecified type   [facility], MD   5 months ago Acute pain of right knee   [facility], MD   6 months ago Sacro-iliac pain   [facility], MD   8 months ago Type II diabetes mellitus with complication [facility])   [facility], MD   10 months ago Gastroesophageal reflux disease with esophagitis without hemorrhage   [facility] Reubin Milan, MD       Future Appointments             In 2 days Reubin Milan, MD [facility], [facility]   In 2 weeks [facility], MD [facility]   In 3 months Luciano Cutter Judithann Graves, MD [facility], [facility]               Requested Prescriptions  Pending Prescriptions Disp Refills   primidone (MYSOLINE) 50 MG tablet [Pharmacy Med Name: PRIMIDONE  50MG   TAB] 360 tablet 3    Sig: TAKE 1 TABLET BY MOUTH  TWICE DAILY AT 10 AM AND 5  PM AND 2 TABLETS BY MOUTH  AT BEDTIME     Not Delegated - Neurology:  Anticonvulsants Failed - 07/02/2021  8:42 AM      Failed - This refill cannot be delegated      Passed - HCT in normal range and within 360 days    HCT  Date Value Ref Range  Status  05/27/2021 41.7 36.0 - 46.0 % Final   Hematocrit  Date Value Ref Range Status  04/08/2021 41.0 34.0 - 46.6 % Final          Passed - HGB in normal range and within 360 days    Hemoglobin  Date Value Ref Range Status  05/27/2021 13.9 12.0 - 15.0 g/dL Final  54/56/2563 89.3 11.1 - 15.9 g/dL Final          Passed - PLT in normal range and within 360 days    Platelets  Date Value Ref Range Status  05/27/2021 209.0 150.0 - 400.0 K/uL Final  04/08/2021 202 150 - 450 x10E3/uL Final          Passed - WBC in normal range and within 360 days    WBC  Date Value Ref Range Status  05/27/2021 5.2 4.0 - 10.5 K/uL Final          Passed - Valid encounter within last 12 months    Recent Outpatient Visits           2 months ago Hypothyroidism, unspecified type   [facility] Reubin Milan, MD   5 months ago Acute pain of right knee   [facility] Reubin Milan, MD   6 months ago Sacro-iliac pain   [facility] Reubin Milan, MD   8 months ago Type II diabetes mellitus with complication [facility])   [facility] Reubin Milan, MD   10 months ago Gastroesophageal reflux disease with esophagitis without hemorrhage   [facility] Reubin Milan, MD       Future Appointments             In 2 days Reubin Milan, MD [facility], [facility]   In 2 weeks Luciano Cutter, MD [facility]   In 3 months Judithann Graves Nyoka Cowden, MD [facility], [facility]

## 2021-07-04 ENCOUNTER — Ambulatory Visit (INDEPENDENT_AMBULATORY_CARE_PROVIDER_SITE_OTHER): Payer: Medicare Other | Admitting: Internal Medicine

## 2021-07-04 ENCOUNTER — Encounter: Payer: Medicare Other | Admitting: Internal Medicine

## 2021-07-04 ENCOUNTER — Other Ambulatory Visit: Payer: Self-pay

## 2021-07-04 ENCOUNTER — Encounter: Payer: Self-pay | Admitting: Internal Medicine

## 2021-07-04 VITALS — BP 106/68 | HR 71 | Temp 98.3°F | Ht 64.0 in | Wt 163.0 lb

## 2021-07-04 DIAGNOSIS — K5904 Chronic idiopathic constipation: Secondary | ICD-10-CM

## 2021-07-04 DIAGNOSIS — M26629 Arthralgia of temporomandibular joint, unspecified side: Secondary | ICD-10-CM | POA: Diagnosis not present

## 2021-07-04 DIAGNOSIS — R7303 Prediabetes: Secondary | ICD-10-CM | POA: Diagnosis not present

## 2021-07-04 DIAGNOSIS — Z13820 Encounter for screening for osteoporosis: Secondary | ICD-10-CM | POA: Diagnosis not present

## 2021-07-04 DIAGNOSIS — Z0000 Encounter for general adult medical examination without abnormal findings: Secondary | ICD-10-CM

## 2021-07-04 DIAGNOSIS — Z23 Encounter for immunization: Secondary | ICD-10-CM

## 2021-07-04 DIAGNOSIS — R569 Unspecified convulsions: Secondary | ICD-10-CM

## 2021-07-04 DIAGNOSIS — E039 Hypothyroidism, unspecified: Secondary | ICD-10-CM

## 2021-07-04 DIAGNOSIS — E782 Mixed hyperlipidemia: Secondary | ICD-10-CM | POA: Diagnosis not present

## 2021-07-04 DIAGNOSIS — M940 Chondrocostal junction syndrome [Tietze]: Secondary | ICD-10-CM | POA: Diagnosis not present

## 2021-07-04 DIAGNOSIS — N1831 Chronic kidney disease, stage 3a: Secondary | ICD-10-CM | POA: Diagnosis not present

## 2021-07-04 DIAGNOSIS — E118 Type 2 diabetes mellitus with unspecified complications: Secondary | ICD-10-CM | POA: Insufficient documentation

## 2021-07-04 DIAGNOSIS — F3181 Bipolar II disorder: Secondary | ICD-10-CM

## 2021-07-04 NOTE — Progress Notes (Signed)
Date:  07/04/2021   Name:  Jennifer Bright   DOB:  [birth year]   MRN:  [id number]   Chief Complaint: Annual Exam (Breast exam no pap) and Flu Vaccine Jennifer Bright is a 55 y.o. female who presents today for her Complete Annual Exam. She feels well. She reports exercising walking 1 mile per day. She reports she is sleeping well. Breast complaints none.  Mammogram: 10/2020 DEXA: none Pap smear: discontinued Colonoscopy: 03/2017 repeat 5 yrs  Immunization History  Administered Date(s) Administered   Influenza,inj,Quad PF,6+ Mos 07/09/2018, 08/09/2019, 08/14/2020   Influenza-Unspecified 08/13/2017, 07/09/2018, 08/09/2019   Moderna Sars-Covid-2 Vaccination 03/06/2020, 03/31/2020   Tdap 08/07/2011, 09/18/2019   Zoster Recombinat (Shingrix) 04/18/2021, 07/04/2021    Thyroid Problem Presents for follow-up visit. Symptoms include constipation (stool softeners). Patient reports no anxiety, diarrhea, fatigue, palpitations or tremors. The symptoms have been stable. Her past medical history is significant for hyperlipidemia.  Hyperlipidemia This is a chronic problem. The problem is controlled. Associated symptoms include chest pain (at the sternum). Pertinent negatives include no shortness of breath. Current antihyperlipidemic treatment includes statins. The current treatment provides significant improvement of lipids.  Constipation This is a chronic problem. The patient is on a high fiber diet. She Does not exercise regularly. There has Been adequate water intake. Pertinent negatives include no abdominal pain, diarrhea, fever or vomiting. Treatments tried: linzess. The treatment provided moderate relief.  Diabetes She presents for her follow-up diabetic visit. Diabetes type: prediabetes. Her disease course has been stable. Hypoglycemia symptoms include seizures. Pertinent negatives for hypoglycemia include no dizziness, headaches, nervousness/anxiousness or tremors. Associated symptoms  include chest pain (at the sternum). Pertinent negatives for diabetes include no fatigue, no polydipsia and no polyuria. Current diabetic treatment includes diet.   Lab Results  Component Value Date   CREATININE 1.13 05/27/2021   BUN 16 05/27/2021   NA 137 05/27/2021   K 3.7 05/27/2021   CL 105 05/27/2021   CO2 24 05/27/2021   Lab Results  Component Value Date   CHOL 282 (H) 04/08/2021   HDL 48 04/08/2021   LDLCALC 182 (H) 04/08/2021   TRIG 269 (H) 04/08/2021   CHOLHDL 5.9 (H) 04/08/2021   Lab Results  Component Value Date   TSH 0.669 04/08/2021   Lab Results  Component Value Date   HGBA1C 6.0 (H) 04/08/2021   Lab Results  Component Value Date   WBC 5.2 05/27/2021   HGB 13.9 05/27/2021   HCT 41.7 05/27/2021   MCV 89.2 05/27/2021   PLT 209.0 05/27/2021   Lab Results  Component Value Date   ALT 14 05/27/2021   AST 14 05/27/2021   ALKPHOS 50 05/27/2021   BILITOT 0.3 05/27/2021     Review of Systems  Constitutional:  Negative for chills, fatigue, fever and unexpected weight change.  HENT:  Positive for ear pain (on right) and tinnitus. Negative for congestion, hearing loss, trouble swallowing and voice change.   Eyes:  Negative for visual disturbance.  Respiratory:  Positive for chest tightness. Negative for cough, shortness of breath and wheezing.   Cardiovascular:  Positive for chest pain (at the sternum). Negative for palpitations and leg swelling.  Gastrointestinal:  Positive for constipation (stool softeners). Negative for abdominal pain, diarrhea and vomiting.  Endocrine: Negative for polydipsia and polyuria.  Genitourinary:  Positive for urgency. Negative for dysuria, frequency, genital sores, vaginal bleeding and vaginal discharge.  Musculoskeletal:  Positive for neck pain and neck stiffness. Negative for arthralgias, gait problem and  joint swelling.  Skin:  Negative for color change and rash.  Neurological:  Positive for seizures and syncope. Negative for  dizziness, tremors, light-headedness and headaches.  Hematological:  Negative for adenopathy. Does not bruise/bleed easily.  Psychiatric/Behavioral:  Positive for sleep disturbance. Negative for dysphoric mood. The patient is not nervous/anxious.    Patient Active Problem List   Diagnosis Date Noted   Shortness of breath 06/29/2021   Lumbar radiculopathy 05/27/2021   Gastritis without bleeding    S/P total abdominal hysterectomy and bilateral salpingo-oophorectomy 08/14/2020   Chronic idiopathic constipation 08/14/2020   Depression, major, recurrent, severe with psychosis (HCC) 06/26/2020   Seizure-like activity (HCC) 06/04/2020   Urinary incontinence without sensory awareness 05/21/2020   Bipolar 2 disorder, major depressive episode (HCC) 09/19/2019   Stage 3a chronic kidney disease (HCC) 07/10/2019   Proteinuria 07/10/2019   Myalgia 12/30/2017   Vaginal atrophy 07/12/2017   Polypharmacy 08/14/2016   Sarcoidosis 08/14/2016   Hypercalcemia 07/24/2016   Hyperlipidemia, unspecified 11/15/2015   Prediabetes 11/15/2015   Anxiety disorder 07/29/2015   Benign essential tremor 05/12/2015   GERD with esophagitis 05/12/2015   Hypersomnia with sleep apnea 05/12/2015   Restless leg 05/12/2015   Acquired hypothyroidism 05/12/2015   Chronic migraine without aura 09/03/2012   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    Allergies  Allergen Reactions   Cleocin [Clindamycin Hcl] Other (See Comments)    GI distress    Past Surgical History:  Procedure Laterality Date   ABDOMINAL HYSTERECTOMY     APPENDECTOMY     BLADDER SURGERY     bladder tact   CATARACT EXTRACTION W/PHACO Right 09/21/2018   Procedure: CATARACT EXTRACTION PHACO AND INTRAOCULAR LENS PLACEMENT (IOC);  Surgeon: Nevada Crane, MD;  Location: [facility] ORS;  Service: Ophthalmology;  Laterality: Right;  [place]  00:20 CDE 00.82 Fluid pack lot # [id number]   CATARACT EXTRACTION W/PHACO Left 10/17/2018   Procedure: CATARACT  EXTRACTION PHACO AND INTRAOCULAR LENS PLACEMENT (IOC)  LEFT;  Surgeon: Nevada Crane, MD;  Location: [facility];  Service: Ophthalmology;  Laterality: Left;  diabetic - diet controlled   CESAREAN SECTION     CHOLECYSTECTOMY     COLONOSCOPY WITH PROPOFOL N/A 04/05/2017   Procedure: COLONOSCOPY WITH PROPOFOL;  Surgeon: Christena Deem, MD;  Location: [facility] END[facility];  Service: Endoscopy;  Laterality: N/A;   ESOPHAGOGASTRODUODENOSCOPY (EGD) WITH PROPOFOL N/A 08/28/2016   Procedure: ESOPHAGOGASTRODUODENOSCOPY (EGD) WITH PROPOFOL;  Surgeon: Christena Deem, MD;  Location: [facility] END[facility];  Service: Endoscopy;  Laterality: N/A;   ESOPHAGOGASTRODUODENOSCOPY (EGD) WITH PROPOFOL N/A 11/08/2020   Procedure: ESOPHAGOGASTRODUODENOSCOPY (EGD) WITH PROPOFOL w// biopsy;  Surgeon: Midge Minium, MD;  Location: [facility];  Service: Endoscopy;  Laterality: N/A;  Pre-Diabetic   HERNIA REPAIR     INGUINAL HERNIA REPAIR     rt and left   lexiscan cardiolite     TILT TABLE STUDY N/A 04/01/2012   Procedure: TILT TABLE STUDY;  Surgeon: Duke Salvia, MD;  Location: [facility] CATH LAB;  Service: Cardiovascular;  Laterality: N/A;    Social History   Tobacco Use   Smoking status: Former    Types: Cigarettes    Start date: 2011    Quit date: 2018    Years since quitting: 4.7   Smokeless tobacco: Never  Vaping Use   Vaping Use: Never used  Substance Use Topics   Alcohol use: No   Drug use: No     Medication list has been reviewed and updated.  Current  Meds  Medication Sig   ALPRAZolam (XANAX XR) 1 MG 24 hr tablet Take 1 mg by mouth daily. Dr. Evelene Croon   ALPRAZolam Prudy Feeler) 1 MG tablet Take 1 mg by mouth at bedtime as needed for anxiety.   amphetamine-dextroamphetamine (ADDERALL XR) 30 MG 24 hr capsule Take 30 mg by mouth 2 (two) times daily.   atorvastatin (LIPITOR) 40 MG tablet Take 40 mg by mouth at bedtime. Prescribed by PCP   benztropine (COGENTIN) 1 MG tablet Take 1 mg by mouth 2 (two)  times daily. Dr. Evelene Croon   Cholecalciferol (VITAMIN D3) 50 MCG (2000 UT) capsule Take 2,000 Units by mouth daily.   cyclobenzaprine (FLEXERIL) 10 MG tablet Take 10 mg by mouth 3 (three) times daily as needed.   Docusate Sodium (DSS) 100 MG CAPS Take 100 mg by mouth daily.    DULoxetine (CYMBALTA) 60 MG capsule Take 60 mg by mouth daily. Dr. Evelene Croon   furosemide (LASIX) 20 MG tablet Take 1 tablet (20 mg total) by mouth daily as needed. Prescribed by PCP   gabapentin (NEURONTIN) 300 MG capsule Take 1 capsule by mouth in the morning and at bedtime.   levothyroxine (SYNTHROID) 100 MCG tablet TAKE 1 TABLET EVERY DAY ON EMPTY STOMACHWITH A GLASS OF WATER AT LEAST 30-60 MINBEFORE BREAKFAST (Patient taking differently: Take 100 mcg by mouth daily before breakfast.)   Melatonin 10 MG TABS Take 10 mg by mouth at bedtime.   Multiple Vitamin (MULTIVITAMIN) capsule Take by mouth.   Omega-3 300 MG CAPS Take 1.2 g by mouth daily.    pantoprazole (PROTONIX) 40 MG tablet Take 1 tablet (40 mg total) by mouth 2 (two) times daily. (Patient taking differently: Take 40 mg by mouth daily.)   primidone (MYSOLINE) 50 MG tablet TAKE 1 TABLET BY MOUTH  TWICE DAILY AT 10 AM AND 5  PM AND 2 TABLETS BY MOUTH  AT BEDTIME   rOPINIRole (REQUIP) 1 MG tablet Take 1.5 mg by mouth at bedtime. Dr. Meredeth Ide   topiramate (TOPAMAX) 200 MG tablet TAKE 1 TABLET BY MOUTH IN  THE MORNING AND 1 TABLET BY MOUTH IN THE EVENING   traZODone (DESYREL) 100 MG tablet Take 150-200 mg by mouth at bedtime. Dr Evelene Croon   ziprasidone (GEODON) 60 MG capsule Take 120 mg by mouth every evening. Dr. Evelene Croon   [DISCONTINUED] linaclotide [facility]) 290 MCG CAPS capsule Take 1 capsule (290 mcg total) by mouth daily before breakfast. (Patient taking differently: Take 290 mcg by mouth daily as needed.)    PHQ 2/9 Scores 07/04/2021 04/08/2021 01/08/2021 12/17/2020  PHQ - 2 Score 5 5 4 6   PHQ- 9 Score 21 18 11 16   Exception Documentation - - - -  Some encounter information is  confidential and restricted. Go to Review Flowsheets activity to see all data.    GAD 7 : Generalized Anxiety Score 07/04/2021 04/08/2021 01/08/2021 12/17/2020  Nervous, Anxious, on Edge 3 3 3 2   Control/stop worrying 3 2 3 2   Worry too much - different things 3 2 3 2   Trouble relaxing 3 2 3 3   Restless 2 2 2  0  Easily annoyed or irritable 2 2 2 2   Afraid - awful might happen 3 2 2  0  Total GAD 7 Score 19 15 18 11   Anxiety Difficulty - Somewhat difficult - Somewhat difficult    BP Readings from Last 3 Encounters:  07/04/21 106/68  06/10/21 115/69  06/03/21 110/70    Physical Exam Vitals and nursing note reviewed.  Constitutional:      General: She is not in acute distress.    Appearance: Normal appearance. She is well-developed.  HENT:     Head: Normocephalic and atraumatic.     Jaw: Tenderness, pain on movement and malocclusion present.     Right Ear: Tympanic membrane and ear canal normal.     Left Ear: Tympanic membrane and ear canal normal.     Nose:     Right Sinus: No maxillary sinus tenderness.     Left Sinus: No maxillary sinus tenderness.  Eyes:     General: No scleral icterus.       Right eye: No discharge.        Left eye: No discharge.     Conjunctiva/sclera: Conjunctivae normal.  Neck:     Thyroid: No thyromegaly.     Vascular: No carotid bruit.  Cardiovascular:     Rate and Rhythm: Normal rate and regular rhythm. No extrasystoles are present.    Pulses: Normal pulses.     Heart sounds: Normal heart sounds.  Pulmonary:     Effort: Pulmonary effort is normal. No respiratory distress.     Breath sounds: Normal breath sounds. No wheezing.  Chest:     Chest wall: Tenderness (anterior chest wall) present. No mass.  Breasts:    Right: No mass, nipple discharge, skin change or tenderness.     Left: No mass, nipple discharge, skin change or tenderness.  Abdominal:     General: Abdomen is flat. Bowel sounds are normal.     Palpations: Abdomen is soft.      Tenderness: There is abdominal tenderness in the right lower quadrant and left lower quadrant. There is no guarding or rebound.  Musculoskeletal:     Cervical back: Normal range of motion. No erythema.     Right lower leg: No edema.     Left lower leg: No edema.  Lymphadenopathy:     Cervical: No cervical adenopathy.     Right cervical: No superficial cervical adenopathy.    Left cervical: No superficial cervical adenopathy.     Upper Body:     Right upper body: No supraclavicular adenopathy.     Left upper body: No supraclavicular adenopathy.  Skin:    General: Skin is warm and dry.     Findings: No rash.       Neurological:     Mental Status: She is alert and oriented to person, place, and time.     Cranial Nerves: No cranial nerve deficit.     Sensory: No sensory deficit.     Motor: Tremor present.     Coordination: Coordination abnormal.     Gait: Gait abnormal (uses walker or wheelchair).     Deep Tendon Reflexes: Reflexes are normal and symmetric.  Psychiatric:        Attention and Perception: Attention normal.        Mood and Affect: Mood normal.        Speech: Speech normal.        Cognition and Memory: Cognition normal.    Wt Readings from Last 3 Encounters:  07/04/21 163 lb (73.9 kg)  06/03/21 163 lb (73.9 kg)  05/30/21 163 lb 9.6 oz (74.2 kg)    BP 106/68   Pulse 71   Temp 98.3 F (36.8 C) (Oral)   Ht  (1.626 m)   Wt 163 lb (73.9 kg)   SpO2 97%   BMI 27.98 kg/m   Assessment and Plan:  1. Annual physical exam Up to date on mammogram Due for DEXA and influenza vaccine Will return later for Flu vaccine  2. Encounter for screening for osteoporosis Ordered- provided with number to call and schedule  3. TMJ syndrome Recommend soft foods; tylenol and ice/heat May need a night guard if her sleep study indicates jaw clenching or teeth grinding  4. Costochondritis Heat and Tylenol Adjust position when using the walker for long distances  5. Mixed  hyperlipidemia Lipids not at goal as of last check in August Continue Atorvastatin 40 mg daily.  6. Acquired hypothyroidism Supplemented.  TSH and T4 normal. Continue same dose.  7. Chronic idiopathic constipation Unable to afford Linzess Continue stool softeners; miralax not much help  8. Stage 3a chronic kidney disease (HCC) GFR 55; continue to monitor; follow up with Nephrology Avoid regular doses of NSAIDS  9. Prediabetes stable  10. Bipolar 2 disorder, major depressive episode (HCC) Followed by Psych  11. Seizure-like activity (HCC) Being tested by Neurology Sleep study results pending  12. Need for shingles vaccine - Varicella-zoster vaccine IM (Shingrix)   Partially dictated using Animal nutritionist. Any errors are unintentional.  Bari Edward, MD [facility]  07/04/2021

## 2021-07-09 DIAGNOSIS — N1831 Chronic kidney disease, stage 3a: Secondary | ICD-10-CM | POA: Diagnosis not present

## 2021-07-09 DIAGNOSIS — D869 Sarcoidosis, unspecified: Secondary | ICD-10-CM | POA: Diagnosis not present

## 2021-07-09 DIAGNOSIS — E1165 Type 2 diabetes mellitus with hyperglycemia: Secondary | ICD-10-CM | POA: Diagnosis not present

## 2021-07-09 DIAGNOSIS — R809 Proteinuria, unspecified: Secondary | ICD-10-CM | POA: Diagnosis not present

## 2021-07-09 DIAGNOSIS — E1121 Type 2 diabetes mellitus with diabetic nephropathy: Secondary | ICD-10-CM | POA: Diagnosis not present

## 2021-07-09 DIAGNOSIS — I129 Hypertensive chronic kidney disease with stage 1 through stage 4 chronic kidney disease, or unspecified chronic kidney disease: Secondary | ICD-10-CM | POA: Diagnosis not present

## 2021-07-18 ENCOUNTER — Ambulatory Visit (INDEPENDENT_AMBULATORY_CARE_PROVIDER_SITE_OTHER): Payer: Medicare Other | Admitting: Pulmonary Disease

## 2021-07-18 ENCOUNTER — Other Ambulatory Visit: Payer: Self-pay

## 2021-07-18 ENCOUNTER — Ambulatory Visit: Payer: Medicare Other | Admitting: Pulmonary Disease

## 2021-07-18 ENCOUNTER — Encounter: Payer: Self-pay | Admitting: Pulmonary Disease

## 2021-07-18 VITALS — BP 124/74 | HR 81 | Temp 97.8°F | Ht 64.0 in | Wt 163.8 lb

## 2021-07-18 DIAGNOSIS — R0602 Shortness of breath: Secondary | ICD-10-CM | POA: Diagnosis not present

## 2021-07-18 DIAGNOSIS — J984 Other disorders of lung: Secondary | ICD-10-CM | POA: Diagnosis not present

## 2021-07-18 DIAGNOSIS — D869 Sarcoidosis, unspecified: Secondary | ICD-10-CM

## 2021-07-18 LAB — PULMONARY FUNCTION TEST
DL/VA % pred: 103 %
DL/VA: 4.42 ml/min/mmHg/L
DLCO cor % pred: 98 %
DLCO cor: 20.44 ml/min/mmHg
DLCO unc % pred: 100 %
DLCO unc: 20.75 ml/min/mmHg
FEF 25-75 Pre: 3.13 L/sec
FEF2575-%Pred-Pre: 120 %
FEV1-%Pred-Pre: 100 %
FEV1-Pre: 2.73 L
FEV1FVC-%Pred-Pre: 106 %
FEV6-%Pred-Pre: 95 %
FEV6-Pre: 3.21 L
FEV6FVC-%Pred-Pre: 102 %
FVC-%Pred-Pre: 93 %
FVC-Pre: 3.22 L
Pre FEV1/FVC ratio: 85 %
Pre FEV6/FVC Ratio: 100 %
RV % pred: 32 %
RV: 0.61 L
TLC % pred: 73 %
TLC: 3.73 L

## 2021-07-18 NOTE — Progress Notes (Signed)
Spirometry done today. 

## 2021-07-18 NOTE — Patient Instructions (Addendum)
Pulmonary sarcoidosis with restrictive lung defect Shortness of breath - CT Chest without contrast - Complete echocardiogram - Will consider Cardiology referral after results  Follow-up with me in one month

## 2021-07-18 NOTE — Progress Notes (Signed)
Subjective:   PATIENT ID: Jennifer Bright GENDER: female DOB: [birth year], MRN: [id number]   HPI  Chief Complaint  Patient presents with   Follow-up    Could not finish PFT, refused EMS   Reason for Visit: Follow-up for sarcoidosis  [name] is a 55 year old female with history of sarcoidosis who presents as a new patient for persistent shortness of breath and chest pain for two months. Husband present and provides additional history.  She was diagnosed with sarcoidosis in 2015. She had initially presented to the [facility] ED for kidney stones and chest imaging concerning for lung nodules. She underwent bronchoscopy and which confirmed findings consistent with sarcoid. She was previously seen in [place] in Pulmonary clinic with Dr. Meredeth Ide. Her last PFT was two years ago and CXR 1 year ago.   She had bilateral cataract surgery in 2019 and last eye exam in Oct/Nov 2021.   She reports shortness of breath with exertion. Able to walk 3/4 mile daily at a slow pace. Denies wheezing or cough.  07/18/21 While undergoing PFTs, she had a syncopal episode. I was called by staff to evaluate patient. She was initially unresponsive to sternal rub but maintained pulse. She slowly awakened. No focal deficits. Followed commands. EKG with unchanged incomplete RBBB. EMS was called to the office. She declined EMS as she is currently being worked up at [employer]. She wished to be seen in clinic for her sarcoid follow-up.  She reports that her chronic medications lead her to have suicidal ideation and seeing a psychiatrist, last seen in the two weeks. Prior to this visit, she has been working on being more active. She is walking one mile daily at a steady/brisk pace. Denies shortness of breath, cough or wheezing. Only requires 1-2 breaks for a few minutes which is better compared to six weeks ago. She does have shortness of breath and chest pain which is triggered by stress and anxiety.  Does require lasix for lower extremity edema  Social History: Quit in 2018. Smoked 1 ppd x 4 years.  Environmental exposures:  Retail  Past Medical History:  Diagnosis Date   Acid reflux    Anemia    Anxiety    Bipolar 1 disorder, manic, moderate (HCC) 09/19/2019   Bipolar depression (HCC) 05/12/2015   Bipolar disorder (HCC)    Bipolar disorder (HCC) 05/12/2015   Bleach ingestion 06/27/2020   Cataract    Chronic kidney disease    STAGE 3   Complication of anesthesia    Felt cataract procedures   Depression    Diabetes mellitus    NIDD   Essential tremor    Family history of adverse reaction to anesthesia    Mother - slow to wake   Family history of breast cancer    doesn't meet medicare guidelines for cancer genetic testing.    Family history of ovarian cancer    Pt doesn't meet Medicare genetic testing guidelines   Hyperlipidemia    Hypothyroid    IBS (irritable bowel syndrome)    Injury by caustic substances, except poisoning, undetermined whether accidentally or purposely inflicted    Migraine headache    vestibular migraine   Obesity    Osteopenia    Restless leg syndrome    Sarcoidosis    Self-inflicted laceration of left wrist (HCC) 09/19/2019   Sleep apnea    Syncope \     Allergies  Allergen Reactions   Cleocin [Clindamycin Hcl] Other (See  Comments)    GI distress     Outpatient Medications Prior to Visit  Medication Sig Dispense Refill   ALPRAZolam (XANAX XR) 1 MG 24 hr tablet Take 1 mg by mouth daily. Dr. Evelene Croon     ALPRAZolam Prudy Feeler) 1 MG tablet Take 1 mg by mouth at bedtime as needed for anxiety.     amphetamine-dextroamphetamine (ADDERALL XR) 30 MG 24 hr capsule Take 30 mg by mouth 2 (two) times daily.     atorvastatin (LIPITOR) 40 MG tablet Take 40 mg by mouth at bedtime. Prescribed by PCP     benztropine (COGENTIN) 1 MG tablet Take 1 mg by mouth 2 (two) times daily. Dr. Evelene Croon     Cholecalciferol (VITAMIN D3) 50 MCG (2000 UT) capsule Take 2,000 Units  by mouth daily.     cyclobenzaprine (FLEXERIL) 10 MG tablet Take 10 mg by mouth 3 (three) times daily as needed.     Docusate Sodium (DSS) 100 MG CAPS Take 100 mg by mouth daily.      DULoxetine (CYMBALTA) 60 MG capsule Take 60 mg by mouth daily. Dr. Evelene Croon     furosemide (LASIX) 20 MG tablet Take 1 tablet (20 mg total) by mouth daily as needed. Prescribed by PCP 30 tablet 5   gabapentin (NEURONTIN) 300 MG capsule Take 1 capsule by mouth in the morning and at bedtime.     levothyroxine (SYNTHROID) 100 MCG tablet TAKE 1 TABLET EVERY DAY ON EMPTY STOMACHWITH A GLASS OF WATER AT LEAST 30-60 MINBEFORE BREAKFAST (Patient taking differently: Take 100 mcg by mouth daily before breakfast.) 30 tablet 3   Melatonin 10 MG TABS Take 10 mg by mouth at bedtime.     Multiple Vitamin (MULTIVITAMIN) capsule Take by mouth.     Omega-3 300 MG CAPS Take 1.2 g by mouth daily.      pantoprazole (PROTONIX) 40 MG tablet Take 1 tablet (40 mg total) by mouth 2 (two) times daily. (Patient taking differently: Take 40 mg by mouth daily.) 60 tablet 5   primidone (MYSOLINE) 50 MG tablet TAKE 1 TABLET BY MOUTH  TWICE DAILY AT 10 AM AND 5  PM AND 2 TABLETS BY MOUTH  AT BEDTIME 360 tablet 0   rOPINIRole (REQUIP) 1 MG tablet Take 1.5 mg by mouth at bedtime. Dr. Meredeth Ide     topiramate (TOPAMAX) 200 MG tablet TAKE 1 TABLET BY MOUTH IN  THE MORNING AND 1 TABLET BY MOUTH IN THE EVENING 180 tablet 0   traZODone (DESYREL) 100 MG tablet Take 150-200 mg by mouth at bedtime. Dr Evelene Croon     ziprasidone (GEODON) 60 MG capsule Take 120 mg by mouth every evening. Dr. Evelene Croon     No facility-administered medications prior to visit.    Review of Systems  Constitutional:  Negative for chills, diaphoresis, fever, malaise/fatigue and weight loss.  HENT:  Negative for congestion.   Respiratory:  Positive for shortness of breath. Negative for cough, hemoptysis, sputum production and wheezing.   Cardiovascular:  Positive for chest pain. Negative for  palpitations and leg swelling.    Objective:   Vitals:   07/18/21 1000  BP: 124/74  Pulse: 81  Temp: 97.8 F (36.6 C)  TempSrc: Oral  SpO2: 99%  Weight: 163 lb 12.8 oz (74.3 kg)  Height: 5\' 4"  (1.626 m)      Physical Exam: General: Well-appearing, no acute distress HENT: [place], AT, OP clear, MMM Eyes: EOMI, no scleral icterus Respiratory: Clear to auscultation bilaterally.  No crackles, wheezing  or rales Cardiovascular: RRR, -M/R/G, no JVD Extremities:-Edema,-tenderness Neuro: AAO x4, CNII-XII grossly intact Skin: Intact, no rashes or bruising Psych: Normal mood, normal affect  Data Reviewed:  Imaging: - CT Chest 05/16/14 - Mediastinal and hilar lymphadenopathy and bilateral airspace opacities associated with patchy GGO - CT Chest 03/26/17 - Stable mediastinal and hilar lymphadenopathy and bilateral airspace opacities consistent with known sarcoidosis - CT Chest 07/16/16 - Progression of pulmonary sarcoid with bilateral nodularity with upper lobe predominance - CT Chest 03/26/17 - Improved of pulmonary sarcoid even compared to CT Chest in 2015 with residual scarring in upper lobes. Adenopathy decreased.  PFT: Interpretation: Mild restrictive defect. Normal diffusion  Labs: CBC Latest Ref Rng & Units 05/27/2021 04/08/2021 06/25/2020  WBC 4.0 - 10.5 K/uL 5.2 5.6 6.4  Hemoglobin 12.0 - 15.0 g/dL 01.0 27.2 53.6  Hematocrit 36.0 - 46.0 % 41.7 41.0 41.9  Platelets 150.0 - 400.0 K/uL 209.0 202 213   CMP Latest Ref Rng & Units 05/27/2021 05/27/2021 04/08/2021  Glucose 70 - 99 mg/dL - 644(I) 347(Q)  BUN 6 - 23 mg/dL - 16 16  Creatinine 2.59 - 1.20 mg/dL - 5.63 8.75(I)  Sodium 135 - 145 mEq/L - 137 142  Potassium 3.5 - 5.1 mEq/L - 3.7 4.2  Chloride 96 - 112 mEq/L - 105 105  CO2 19 - 32 mEq/L - 24 23  Calcium 8.6 - 10.4 mg/dL 9.6 9.6 9.6  Total Protein 6.0 - 8.3 g/dL - 6.9 6.2  Total Bilirubin 0.2 - 1.2 mg/dL - 0.3 <4.3  Alkaline Phos 39 - 117 U/L - 50 64  AST 0 - 37 U/L - 14 16  ALT 0  - 35 U/L - 14 17   EKG 07/18/21 Incomplete RBBB. No evidence ST elevations or TWI    Assessment & Plan:   Discussion: 55 year old female with pulmonary sarcoid who presents for follow-up. Prior to visit she had syncopal episode followed by brief period of unresponsiveness (~5 minutes). She declined EMS as noted above. EKG obtained and reviewed personall by me with unchanged RBBB and no evidence ST changes or TWI. We agreed to proceed with clinic appointment. Reviewed PFTs which demonstrate restrictive defect. Her PFTs do not fully explain her dyspnea. Will work-up with CT Chest and echocardiogram.  Unresponsiveness Syncope, suspected vasovagal - Recovered. Neurologically intact - Patient refused EMS  Pulmonary sarcoidosis with restrictive lung defect - Dx in 2015 via endobronchial bx - No indication for prednisone therapy. Previously on for 1 year in 2015. - Annual PFTs. Next due 06/2022 - Recommend annual ophthalmology exam.  Last visit on 07/2020 - EKG reviewed. Unchanged RBBB - Reviewed CBC and CMP. Stage III CKD. Following Nephrology - CT Chest without contrast  Shortness of breath Chest pain - EKG reviewed with no acute findings. No ST-elevation or TWI - Echocardiogram - Will consider Cardiology referral after results  Health Maintenance Immunization History  Administered Date(s) Administered   Influenza,inj,Quad PF,6+ Mos 07/09/2018, 08/09/2019, 08/14/2020   Influenza-Unspecified 08/13/2017, 07/09/2018, 08/09/2019   Moderna Sars-Covid-2 Vaccination 03/06/2020, 03/31/2020   Tdap 08/07/2011, 09/18/2019   Zoster Recombinat (Shingrix) 04/18/2021, 07/04/2021   CT Lung Screen - not qualified. Insufficient smoking history  Orders Placed This Encounter  Procedures   CT Chest Wo Contrast    Standing Status:   Future    Standing Expiration Date:   07/18/2022    Scheduling Instructions:     Next available    Order Specific Question:   Is patient pregnant?  Answer:   No     Order Specific Question:   Preferred imaging location?    Answer:   [facility]   ECHOCARDIOGRAM COMPLETE    Standing Status:   Future    Standing Expiration Date:   07/18/2022    Scheduling Instructions:     Next available    Order Specific Question:   Where should this test be performed    Answer:   [facility]    Order Specific Question:   Please indicate who you request to read the nuc med / echo results.    Answer:   [facility]    Order Specific Question:   Perflutren DEFINITY (image enhancing agent) should be administered unless hypersensitivity or allergy exist    Answer:   Administer Perflutren    Order Specific Question:   Reason for exam-Echo    Answer:   Dyspnea  R06.00   No orders of the defined types were placed in this encounter.  Return in about 1 month (around 08/17/2021).  I have spent a total time of 65-minutes on the day of the appointment reviewing prior documentation, coordinating care and discussing medical diagnosis and plan with the patient/family. Past medical history, allergies, medications were reviewed. Pertinent imaging, labs and tests included in this note have been reviewed and interpreted independently by me.  Laterria Lasota Mechele Collin, MD  Pulmonary Critical Care 07/18/2021 8:49 AM  Office Number [phone]

## 2021-07-30 ENCOUNTER — Ambulatory Visit (INDEPENDENT_AMBULATORY_CARE_PROVIDER_SITE_OTHER): Payer: Medicare Other

## 2021-07-30 ENCOUNTER — Ambulatory Visit: Payer: Medicare Other

## 2021-07-30 ENCOUNTER — Other Ambulatory Visit: Payer: Self-pay

## 2021-07-30 ENCOUNTER — Telehealth: Payer: Self-pay | Admitting: Internal Medicine

## 2021-07-30 DIAGNOSIS — Z23 Encounter for immunization: Secondary | ICD-10-CM

## 2021-07-30 NOTE — Telephone Encounter (Signed)
Copied from CRM [id number]. Topic: Medicare AWV >> Jul 30, 2021 11:07 AM Claudette Laws R wrote: Reason for CRM:  10/12 LM with new appt information - rescheduled due to [facility] leaving early -Family Emergency-srs

## 2021-07-30 NOTE — Telephone Encounter (Signed)
Left message for patient to call back.  Need to rescheduled appointment for Jul 30, 2021 at 3:20 due to  [facility] leaving early.

## 2021-08-01 ENCOUNTER — Other Ambulatory Visit: Payer: Self-pay | Admitting: Internal Medicine

## 2021-08-01 NOTE — Telephone Encounter (Signed)
Requested medication (s) are due for refill today: yes  Requested medication (s) are on the active medication list: yes  Last refill:  02/03/21 #180  Future visit scheduled: yes  Notes to clinic:  Please review for refill. Refill not delegated per protocol    Requested Prescriptions  Pending Prescriptions Disp Refills   topiramate (TOPAMAX) 200 MG tablet [Pharmacy Med Name: TOPIRAMATE 200 MG TAB] 180 tablet 0    Sig: TAKE ONE TABLET BY MOUTH EVERY MORNING AND TAKE ONE TABLET EVERY EVENING     Not Delegated - Neurology: Anticonvulsants - topiramate & zonisamide Failed - 08/01/2021 10:24 AM      Failed - This refill cannot be delegated      Passed - Cr in normal range and within 360 days    Creatinine  Date Value Ref Range Status  10/10/2014 0.79 0.60 - 1.30 mg/dL Final   Creatinine, Ser  Date Value Ref Range Status  05/27/2021 1.13 0.40 - 1.20 mg/dL Final          Passed - CO2 in normal range and within 360 days    CO2  Date Value Ref Range Status  05/27/2021 24 19 - 32 mEq/L Final   Co2  Date Value Ref Range Status  10/10/2014 25 21 - 32 mmol/L Final          Passed - Valid encounter within last 12 months    Recent Outpatient Visits           4 weeks ago Annual physical exam   [facility] Reubin Milan, MD   3 months ago Hypothyroidism, unspecified type   [facility] Reubin Milan, MD   6 months ago Acute pain of right knee   [facility] Reubin Milan, MD   7 months ago Sacro-iliac pain   [facility] Reubin Milan, MD   9 months ago Type II diabetes mellitus with complication [facility])   [facility] Reubin Milan, MD       Future Appointments             In 1 month Luciano Cutter, MD [facility]   In 2 months Judithann Graves Nyoka Cowden, MD [facility], [facility]

## 2021-08-04 ENCOUNTER — Other Ambulatory Visit: Payer: Self-pay | Admitting: Internal Medicine

## 2021-08-04 NOTE — Telephone Encounter (Signed)
Requested medication (s) are due for refill today - no  Requested medication (s) are on the active medication list -yes  Future visit scheduled -yes  Last refill: 08/01/21 #180  Notes to clinic: Request RF: non delegated Rx, duplicate request  Requested Prescriptions  Pending Prescriptions Disp Refills   topiramate (TOPAMAX) 200 MG tablet [Pharmacy Med Name: TOPIRAMATE 200 MG TAB] 180 tablet 0    Sig: TAKE ONE TABLET BY MOUTH EVERY MORNING AND TAKE ONE TABLET EVERY EVENING     Not Delegated - Neurology: Anticonvulsants - topiramate & zonisamide Failed - 08/04/2021 10:40 AM      Failed - This refill cannot be delegated      Passed - Cr in normal range and within 360 days    Creatinine  Date Value Ref Range Status  10/10/2014 0.79 0.60 - 1.30 mg/dL Final   Creatinine, Ser  Date Value Ref Range Status  05/27/2021 1.13 0.40 - 1.20 mg/dL Final          Passed - CO2 in normal range and within 360 days    CO2  Date Value Ref Range Status  05/27/2021 24 19 - 32 mEq/L Final   Co2  Date Value Ref Range Status  10/10/2014 25 21 - 32 mmol/L Final          Passed - Valid encounter within last 12 months    Recent Outpatient Visits           1 month ago Annual physical exam   [facility] Reubin Milan, MD   3 months ago Hypothyroidism, unspecified type   [facility] Reubin Milan, MD   6 months ago Acute pain of right knee   [facility] Reubin Milan, MD   7 months ago Sacro-iliac pain   [facility] Reubin Milan, MD   9 months ago Type II diabetes mellitus with complication [facility])   [facility] Reubin Milan, MD       Future Appointments             In 4 weeks Luciano Cutter, MD [place] [facility]   In 2 months Reubin Milan, MD [facility], [facility]               Requested Prescriptions  Pending Prescriptions Disp Refills   topiramate (TOPAMAX) 200 MG tablet [Pharmacy Med  Name: TOPIRAMATE 200 MG TAB] 180 tablet 0    Sig: TAKE ONE TABLET BY MOUTH EVERY MORNING AND TAKE ONE TABLET EVERY EVENING     Not Delegated - Neurology: Anticonvulsants - topiramate & zonisamide Failed - 08/04/2021 10:40 AM      Failed - This refill cannot be delegated      Passed - Cr in normal range and within 360 days    Creatinine  Date Value Ref Range Status  10/10/2014 0.79 0.60 - 1.30 mg/dL Final   Creatinine, Ser  Date Value Ref Range Status  05/27/2021 1.13 0.40 - 1.20 mg/dL Final          Passed - CO2 in normal range and within 360 days    CO2  Date Value Ref Range Status  05/27/2021 24 19 - 32 mEq/L Final   Co2  Date Value Ref Range Status  10/10/2014 25 21 - 32 mmol/L Final          Passed - Valid encounter within last 12 months    Recent Outpatient Visits  1 month ago Annual physical exam   [facility] Reubin Milan, MD   3 months ago Hypothyroidism, unspecified type   [facility] Reubin Milan, MD   6 months ago Acute pain of right knee   [facility] Reubin Milan, MD   7 months ago Sacro-iliac pain   [facility] Reubin Milan, MD   9 months ago Type II diabetes mellitus with complication [facility])   [facility] Reubin Milan, MD       Future Appointments             In 4 weeks Luciano Cutter, MD [place] [facility]   In 2 months Judithann Graves Nyoka Cowden, MD [facility], [facility]

## 2021-08-05 ENCOUNTER — Ambulatory Visit: Payer: Medicare Other

## 2021-08-08 ENCOUNTER — Other Ambulatory Visit: Payer: Self-pay

## 2021-08-08 ENCOUNTER — Ambulatory Visit
Admission: RE | Admit: 2021-08-08 | Discharge: 2021-08-08 | Disposition: A | Discharge status: Home / Self Care | Payer: Medicare Other | Source: Ambulatory Visit | Attending: Pulmonary Disease | Admitting: Pulmonary Disease

## 2021-08-08 DIAGNOSIS — J479 Bronchiectasis, uncomplicated: Secondary | ICD-10-CM | POA: Diagnosis not present

## 2021-08-08 DIAGNOSIS — D869 Sarcoidosis, unspecified: Secondary | ICD-10-CM | POA: Diagnosis not present

## 2021-08-08 DIAGNOSIS — J984 Other disorders of lung: Secondary | ICD-10-CM

## 2021-08-08 DIAGNOSIS — R0602 Shortness of breath: Secondary | ICD-10-CM | POA: Insufficient documentation

## 2021-08-11 DIAGNOSIS — Z961 Presence of intraocular lens: Secondary | ICD-10-CM | POA: Diagnosis not present

## 2021-08-11 DIAGNOSIS — H4710 Unspecified papilledema: Secondary | ICD-10-CM | POA: Diagnosis not present

## 2021-08-11 DIAGNOSIS — E119 Type 2 diabetes mellitus without complications: Secondary | ICD-10-CM | POA: Diagnosis not present

## 2021-08-15 ENCOUNTER — Ambulatory Visit: Payer: Medicare Other | Admitting: Pulmonary Disease

## 2021-08-20 ENCOUNTER — Telehealth: Payer: Self-pay | Admitting: Family Medicine

## 2021-08-20 ENCOUNTER — Telehealth: Payer: Self-pay

## 2021-08-20 NOTE — Telephone Encounter (Signed)
Copied from CRM [id number]. Topic: Appointment Scheduling - Scheduling Inquiry for Clinic >> Aug 20, 2021 11:21 AM Elliot Gault wrote: Patient was under the impression her telephone medicare wellness appointment will entail a diabetic physical exam, checking her legs,her feet, specifically her black toe, forearm pain. Patient unsure if this should be a in office appointment or appointment with PCP

## 2021-08-20 NOTE — Telephone Encounter (Signed)
Jennifer Bright called asking if Dr Katrinka Blazing would be willing to order another epidural for her. She said that her last epidural did not seem to help the pain she was having and she would like to try it again.  Please advise.

## 2021-08-21 ENCOUNTER — Ambulatory Visit: Payer: Medicare Other | Admitting: Obstetrics & Gynecology

## 2021-08-21 NOTE — Telephone Encounter (Signed)
Yes that would be fine same level

## 2021-08-21 NOTE — Telephone Encounter (Signed)
Talked to patient and let her know epidural was ordered.

## 2021-08-21 NOTE — Addendum Note (Signed)
Addended by: Nadine Counts R on: 08/21/2021 11:33 AM   Modules accepted: Orders

## 2021-08-22 ENCOUNTER — Other Ambulatory Visit: Payer: Self-pay

## 2021-08-22 ENCOUNTER — Ambulatory Visit (INDEPENDENT_AMBULATORY_CARE_PROVIDER_SITE_OTHER): Payer: Medicare Other

## 2021-08-22 DIAGNOSIS — D869 Sarcoidosis, unspecified: Secondary | ICD-10-CM

## 2021-08-22 DIAGNOSIS — R0602 Shortness of breath: Secondary | ICD-10-CM

## 2021-08-22 DIAGNOSIS — J984 Other disorders of lung: Secondary | ICD-10-CM

## 2021-08-22 LAB — ECHOCARDIOGRAM COMPLETE
AR max vel: 2.07 cm2
AV Area VTI: 2.36 cm2
AV Area mean vel: 2 cm2
AV Mean grad: 4 mmHg
AV Peak grad: 9.4 mmHg
Ao pk vel: 1.53 m/s
Area-P 1/2: 3.19 cm2
Calc EF: 66 %
S' Lateral: 2.8 cm
Single Plane A2C EF: 69.2 %
Single Plane A4C EF: 63.8 %

## 2021-08-25 ENCOUNTER — Ambulatory Visit (INDEPENDENT_AMBULATORY_CARE_PROVIDER_SITE_OTHER): Payer: Medicare Other

## 2021-08-25 DIAGNOSIS — Z79899 Other long term (current) drug therapy: Secondary | ICD-10-CM

## 2021-08-25 DIAGNOSIS — Z0000 Encounter for general adult medical examination without abnormal findings: Secondary | ICD-10-CM | POA: Diagnosis not present

## 2021-08-25 DIAGNOSIS — Z599 Problem related to housing and economic circumstances, unspecified: Secondary | ICD-10-CM

## 2021-08-25 NOTE — Patient Instructions (Signed)
Jennifer Bright , Thank you for taking time to come for your Medicare Wellness Visit. I appreciate your ongoing commitment to your health goals. Please review the following plan we discussed and let me know if I can assist you in the future.   Screening recommendations/referrals: Colonoscopy: done 04/05/17. Repeat 03/2022 Mammogram: done 11/14/20 Bone Density: due. Please call [phone] to schedule your bone density screening Recommended yearly ophthalmology/optometry visit for glaucoma screening and checkup Recommended yearly dental visit for hygiene and checkup  Vaccinations: Influenza vaccine: done 07/30/21 Pneumococcal vaccine: due Tdap vaccine: done 09/18/19 Shingles vaccine: done 04/18/21 & 07/04/21   Covid-19:done 03/06/20 & 03/31/20  Advanced directives: Advance directive discussed with you today. I have provided a copy for you to complete at home and have notarized. Once this is complete please bring a copy in to our office so we can scan it into your chart.   Conditions/risks identified: Recommend preventing falls in the home  Next appointment: Follow up in one year for your annual wellness visit    Preventive Care 65 Years and Older, Female Preventive care refers to lifestyle choices and visits with your health care provider that can promote health and wellness. What does preventive care include? A yearly physical exam. This is also called an annual well check. Dental exams once or twice a year. Routine eye exams. Ask your health care provider how often you should have your eyes checked. Personal lifestyle choices, including: Daily care of your teeth and gums. Regular physical activity. Eating a healthy diet. Avoiding tobacco and drug use. Limiting alcohol use. Practicing safe sex. Taking low-dose aspirin every day. Taking vitamin and mineral supplements as recommended by your health care provider. What happens during an annual well check? The services and screenings done  by your health care provider during your annual well check will depend on your age, overall health, lifestyle risk factors, and family history of disease. Counseling  Your health care provider may ask you questions about your: Alcohol use. Tobacco use. Drug use. Emotional well-being. Home and relationship well-being. Sexual activity. Eating habits. History of falls. Memory and ability to understand (cognition). Work and work Astronomer. Reproductive health. Screening  You may have the following tests or measurements: Height, weight, and BMI. Blood pressure. Lipid and cholesterol levels. These may be checked every 5 years, or more frequently if you are over 35 years old. Skin check. Lung cancer screening. You may have this screening every year starting at age 9 if you have a 30-pack-year history of smoking and currently smoke or have quit within the past 15 years. Fecal occult blood test (FOBT) of the stool. You may have this test every year starting at age 60. Flexible sigmoidoscopy or colonoscopy. You may have a sigmoidoscopy every 5 years or a colonoscopy every 10 years starting at age 48. Hepatitis C blood test. Hepatitis B blood test. Sexually transmitted disease (STD) testing. Diabetes screening. This is done by checking your blood sugar (glucose) after you have not eaten for a while (fasting). You may have this done every 1-3 years. Bone density scan. This is done to screen for osteoporosis. You may have this done starting at age 46. Mammogram. This may be done every 1-2 years. Talk to your health care provider about how often you should have regular mammograms. Talk with your health care provider about your test results, treatment options, and if necessary, the need for more tests. Vaccines  Your health care provider may recommend certain vaccines, such as: Influenza vaccine.  This is recommended every year. Tetanus, diphtheria, and acellular pertussis (Tdap, Td) vaccine. You  may need a Td booster every 10 years. Zoster vaccine. You may need this after age 51. Pneumococcal 13-valent conjugate (PCV13) vaccine. One dose is recommended after age 26. Pneumococcal polysaccharide (PPSV23) vaccine. One dose is recommended after age 76. Talk to your health care provider about which screenings and vaccines you need and how often you need them. This information is not intended to replace advice given to you by your health care provider. Make sure you discuss any questions you have with your health care provider. Document Released: 11/01/2015 Document Revised: 06/24/2016 Document Reviewed: 08/06/2015 Elsevier Interactive Patient Education  2017 [employer] Prevention in the Home Falls can cause injuries. They can happen to people of all ages. There are many things you can do to make your home safe and to help prevent falls. What can I do on the outside of my home? Regularly fix the edges of walkways and driveways and fix any cracks. Remove anything that might make you trip as you walk through a door, such as a raised step or threshold. Trim any bushes or trees on the path to your home. Use bright outdoor lighting. Clear any walking paths of anything that might make someone trip, such as rocks or tools. Regularly check to see if handrails are loose or broken. Make sure that both sides of any steps have handrails. Any raised decks and porches should have guardrails on the edges. Have any leaves, snow, or ice cleared regularly. Use sand or salt on walking paths during winter. Clean up any spills in your garage right away. This includes oil or grease spills. What can I do in the bathroom? Use night lights. Install grab bars by the toilet and in the tub and shower. Do not use towel bars as grab bars. Use non-skid mats or decals in the tub or shower. If you need to sit down in the shower, use a plastic, non-slip stool. Keep the floor dry. Clean up any water that spills  on the floor as soon as it happens. Remove soap buildup in the tub or shower regularly. Attach bath mats securely with double-sided non-slip rug tape. Do not have throw rugs and other things on the floor that can make you trip. What can I do in the bedroom? Use night lights. Make sure that you have a light by your bed that is easy to reach. Do not use any sheets or blankets that are too big for your bed. They should not hang down onto the floor. Have a firm chair that has side arms. You can use this for support while you get dressed. Do not have throw rugs and other things on the floor that can make you trip. What can I do in the kitchen? Clean up any spills right away. Avoid walking on wet floors. Keep items that you use a lot in easy-to-reach places. If you need to reach something above you, use a strong step stool that has a grab bar. Keep electrical cords out of the way. Do not use floor polish or wax that makes floors slippery. If you must use wax, use non-skid floor wax. Do not have throw rugs and other things on the floor that can make you trip. What can I do with my stairs? Do not leave any items on the stairs. Make sure that there are handrails on both sides of the stairs and use them. Fix handrails  that are broken or loose. Make sure that handrails are as long as the stairways. Check any carpeting to make sure that it is firmly attached to the stairs. Fix any carpet that is loose or worn. Avoid having throw rugs at the top or bottom of the stairs. If you do have throw rugs, attach them to the floor with carpet tape. Make sure that you have a light switch at the top of the stairs and the bottom of the stairs. If you do not have them, ask someone to add them for you. What else can I do to help prevent falls? Wear shoes that: Do not have high heels. Have rubber bottoms. Are comfortable and fit you well. Are closed at the toe. Do not wear sandals. If you use a stepladder: Make  sure that it is fully opened. Do not climb a closed stepladder. Make sure that both sides of the stepladder are locked into place. Ask someone to hold it for you, if possible. Clearly mark and make sure that you can see: Any grab bars or handrails. First and last steps. Where the edge of each step is. Use tools that help you move around (mobility aids) if they are needed. These include: Canes. Walkers. Scooters. Crutches. Turn on the lights when you go into a dark area. Replace any light bulbs as soon as they burn out. Set up your furniture so you have a clear path. Avoid moving your furniture around. If any of your floors are uneven, fix them. If there are any pets around you, be aware of where they are. Review your medicines with your doctor. Some medicines can make you feel dizzy. This can increase your chance of falling. Ask your doctor what other things that you can do to help prevent falls. This information is not intended to replace advice given to you by your health care provider. Make sure you discuss any questions you have with your health care provider. Document Released: 08/01/2009 Document Revised: 03/12/2016 Document Reviewed: 11/09/2014 Elsevier Interactive Patient Education  2017 [employer].

## 2021-08-25 NOTE — Progress Notes (Signed)
Subjective:   Jennifer Bright is a 55 y.o. female who presents for an Initial Medicare Annual Wellness Visit.  Virtual Visit via Telephone Note  I connected with  Jennifer Bright on 08/25/21 at  8:40 AM EST by telephone and verified that I am speaking with the correct person using two identifiers.  Location: Patient: home Provider: [facility] Persons participating in the virtual visit: patient & husband Jennifer Bright Health Advisor   I discussed the limitations, risks, security and privacy concerns of performing an evaluation and management service by telephone and the availability of in person appointments. The patient expressed understanding and agreed to proceed.  Interactive audio and video telecommunications were attempted between this nurse and patient, however failed, due to patient having technical difficulties OR patient did not have access to video capability.  We continued and completed visit with audio only.  Some vital signs may be absent or patient reported.   Clemetine Marker, LPN   Review of Systems     Cardiac Risk Factors include: advanced age (>59men, >90 women);dyslipidemia     Objective:    Today's Vitals   08/25/21 0852  PainSc: 10-Worst pain ever   There is no height or weight on file to calculate BMI.  Advanced Directives 08/25/2021 11/08/2020 06/25/2020 11/28/2019 09/18/2019 05/19/2019 05/19/2019  Does Patient Have a Medical Advance Directive? No No No No No No No  Would patient like information on creating a medical advance directive? Yes (MAU/Ambulatory/Procedural Areas - Information given) No - Patient declined - No - Patient declined No - Patient declined No - Patient declined No - Patient declined  Pre-existing out of facility DNR order (yellow form or pink MOST form) - - - - - - -  Some encounter information is confidential and restricted. Go to Review Flowsheets activity to see all data.    Current Medications (verified) Outpatient Encounter  Medications as of 08/25/2021  Medication Sig   ALPRAZolam (XANAX XR) 1 MG 24 hr tablet Take 1 mg by mouth daily. Dr. Toy Care   ALPRAZolam Duanne Moron) 1 MG tablet Take 1 mg by mouth at bedtime as needed for anxiety.   amphetamine-dextroamphetamine (ADDERALL XR) 30 MG 24 hr capsule Take 30 mg by mouth 2 (two) times daily.   atorvastatin (LIPITOR) 40 MG tablet Take 40 mg by mouth at bedtime. Prescribed by PCP   benztropine (COGENTIN) 1 MG tablet Take 1 mg by mouth 2 (two) times daily. Dr. Toy Care   Cholecalciferol (VITAMIN D3) 50 MCG (2000 UT) capsule Take 2,000 Units by mouth daily.   cyclobenzaprine (FLEXERIL) 10 MG tablet Take 10 mg by mouth 3 (three) times daily as needed.   Docusate Sodium (DSS) 100 MG CAPS Take 100 mg by mouth daily.    DULoxetine (CYMBALTA) 60 MG capsule Take 60 mg by mouth daily. Dr. Toy Care   furosemide (LASIX) 20 MG tablet Take 1 tablet (20 mg total) by mouth daily as needed. Prescribed by PCP   gabapentin (NEURONTIN) 300 MG capsule Take 1 capsule by mouth in the morning and at bedtime.   levothyroxine (SYNTHROID) 100 MCG tablet TAKE 1 TABLET EVERY DAY ON EMPTY STOMACHWITH A GLASS OF WATER AT LEAST 30-60 MINBEFORE BREAKFAST (Patient taking differently: Take 100 mcg by mouth daily before breakfast.)   Melatonin 10 MG TABS Take 10 mg by mouth at bedtime.   meloxicam (MOBIC) 7.5 MG tablet Take 7.5 mg by mouth daily. PRN   Multiple Vitamin (MULTIVITAMIN) capsule Take by mouth.   Omega-3 300 MG  CAPS Take 1.2 g by mouth daily.    pantoprazole (PROTONIX) 40 MG tablet Take 1 tablet (40 mg total) by mouth 2 (two) times daily. (Patient taking differently: Take 40 mg by mouth daily.)   primidone (MYSOLINE) 50 MG tablet TAKE 1 TABLET BY MOUTH  TWICE DAILY AT 10 AM AND 5  PM AND 2 TABLETS BY MOUTH  AT BEDTIME   rOPINIRole (REQUIP) 1 MG tablet Take 1.5 mg by mouth at bedtime. Dr. Raul Del   topiramate (TOPAMAX) 200 MG tablet TAKE ONE TABLET BY MOUTH EVERY MORNING AND TAKE ONE TABLET EVERY EVENING    traZODone (DESYREL) 100 MG tablet Take 150-200 mg by mouth at bedtime. Dr Toy Care   ziprasidone (GEODON) 60 MG capsule Take 120 mg by mouth every evening. Dr. Toy Care   No facility-administered encounter medications on file as of 08/25/2021.    Allergies (verified) Cleocin [clindamycin hcl]   History: Past Medical History:  Diagnosis Date   Acid reflux    Anemia    Anxiety    Bipolar 1 disorder, manic, moderate ([place]) 09/19/2019   Bipolar depression ([place]) 05/12/2015   Bipolar disorder ([place])    Bipolar disorder ([place]) 05/12/2015   Bleach ingestion 06/27/2020   Cataract    Chronic kidney disease    STAGE 3   Complication of anesthesia    Felt cataract procedures   Depression    Diabetes mellitus    NIDD   Essential tremor    Family history of adverse reaction to anesthesia    Mother - slow to wake   Family history of breast cancer    doesn't meet medicare guidelines for cancer genetic testing.    Family history of ovarian cancer    Pt doesn't meet Medicare genetic testing guidelines   Hyperlipidemia    Hypothyroid    IBS (irritable bowel syndrome)    Injury by caustic substances, except poisoning, undetermined whether accidentally or purposely inflicted    Migraine headache    vestibular migraine   Obesity    Osteopenia    Restless leg syndrome    Sarcoidosis    Self-inflicted laceration of left wrist ([place]) 09/19/2019   Sleep apnea    Syncope \   Past Surgical History:  Procedure Laterality Date   ABDOMINAL HYSTERECTOMY     APPENDECTOMY     BLADDER SURGERY     bladder tact   CATARACT EXTRACTION W/PHACO Right 09/21/2018   Procedure: CATARACT EXTRACTION PHACO AND INTRAOCULAR LENS PLACEMENT ([place]);  Surgeon: [name], MD;  Location: [facility] ORS;  Service: Ophthalmology;  Laterality: Right;  [place]  00:20 CDE 00.82 Fluid pack lot # [id number]   CATARACT EXTRACTION W/PHACO Left 10/17/2018   Procedure: CATARACT EXTRACTION PHACO AND INTRAOCULAR LENS PLACEMENT ([place])  LEFT;  Surgeon:  [name], MD;  Location: [place];  Service: Ophthalmology;  Laterality: Left;  diabetic - diet controlled   CESAREAN SECTION     CHOLECYSTECTOMY     COLONOSCOPY WITH PROPOFOL N/A 04/05/2017   Procedure: COLONOSCOPY WITH PROPOFOL;  Surgeon: [name], MD;  Location: [facility] ENDOSCOPY;  Service: Endoscopy;  Laterality: N/A;   ESOPHAGOGASTRODUODENOSCOPY (EGD) WITH PROPOFOL N/A 08/28/2016   Procedure: ESOPHAGOGASTRODUODENOSCOPY (EGD) WITH PROPOFOL;  Surgeon: [name], MD;  Location[facility] ENDOSCOPY;  Service: Endoscopy;  Laterality: N/A;   ESOPHAGOGASTRODUODENOSCOPY (EGD) WITH PROPOFOL N/A 11/08/2020   Procedure: ESOPHAGOGASTRODUODENOSCOPY (EGD) WITH PROPOFOL w// biopsy;  Surgeon: [name], MD;  Location: [place];  Service: Endoscopy;  Laterality: N/A;  Pre-Diabetic   HERNIA REPAIR     INGUINAL HERNIA REPAIR     rt and left   lexiscan cardiolite     TILT TABLE STUDY N/A 04/01/2012   Procedure: TILT TABLE STUDY;  Surgeon: Duke Salvia, MD;  Location: [facility] CATH LAB;  Service: Cardiovascular;  Laterality: N/A;   Family History  Problem Relation Age of Onset   Hypertension Mother    Hyperlipidemia Mother    Stroke Mother    Lung cancer Mother    Barrett's esophagus Mother    Colon cancer Father    Stomach cancer Father    Irritable bowel syndrome Sister    Alcohol abuse Brother    Cirrhosis Brother    Breast cancer Paternal Aunt    Ovarian cancer Paternal Aunt    Cancer Cousin    Cancer Cousin    Ovarian cancer Cousin    Kidney cancer Neg Hx    Kidney disease Neg Hx    Prostate cancer Neg Hx    Social History   Socioeconomic History   Marital status: Married    Spouse name: Not on file   Number of children: Not on file   Years of education: Not on file   Highest education level: Not on file  Occupational History   Occupation: disabled  Tobacco Use   Smoking status: Former    Types: Cigarettes    Start date: 2011    Quit date:  2018    Years since quitting: 4.8   Smokeless tobacco: Never  Vaping Use   Vaping Use: Never used  Substance and Sexual Activity   Alcohol use: No   Drug use: No   Sexual activity: Yes  Other Topics Concern   Not on file  Social History Narrative   Not on file   Social Determinants of Health   Financial Resource Strain: Medium Risk   Difficulty of Paying Living Expenses: Somewhat hard  Food Insecurity: No Food Insecurity   Worried About Programme researcher, broadcasting/film/video in the Last Year: Never true   Ran Out of Food in the Last Year: Never true  Transportation Needs: No Transportation Needs   Lack of Transportation (Medical): No   Lack of Transportation (Non-Medical): No  Physical Activity: Insufficiently Active   Days of Exercise per Week: 7 days   Minutes of Exercise per Session: 20 min  Stress: Stress Concern Present   Feeling of Stress : Very much  Social Connections: Moderately Isolated   Frequency of Communication with Friends and Family: Never   Frequency of Social Gatherings with Friends and Family: Once a week   Attends Religious Services: More than 4 times per year   Active Member of [employer] or Organizations: No   Attends Engineer, structural: Never   Marital Status: Married    Tobacco Counseling Counseling given: Not Answered   Clinical Intake:  Pre-visit preparation completed: Yes  Pain : 0-10 Pain Score: 10-Worst pain ever Pain Type: Chronic pain Pain Location: Back Pain Orientation: Lower Pain Descriptors / Indicators: Stabbing Pain Onset: More than a month ago Pain Frequency: Constant     Nutritional Risks: None Diabetes: No  How often do you need to have someone help you when you read instructions, pamphlets, or other written materials from your doctor or pharmacy?: 1 - Never    Interpreter Needed?: No  Information entered by :: Reather Littler LPN   Activities of Daily Living In your present state of health, do you have any  difficulty  performing the following activities: 08/25/2021 07/04/2021  Hearing? N N  Vision? N N  Difficulty concentrating or making decisions? Tempie Donning  Walking or climbing stairs? Y Y  Dressing or bathing? N N  Doing errands, shopping? Tempie Donning  Preparing Food and eating ? N -  Using the Toilet? N -  In the past six months, have you accidently leaked urine? N -  Do you have problems with loss of bowel control? N -  Managing your Medications? Y -  Managing your Finances? Y -  Housekeeping or managing your Housekeeping? N -  Some recent data might be hidden    Patient Care Team: Glean Hess, MD as PCP - General (Internal Medicine) Erby Pian, MD as Referring Physician (Pulmonary Disease) Shirlee More, MD as Referring Physician (Ophthalmology) Lavonia Dana, MD as Consulting Physician (Nephrology) Mirng-Eu Lorelee Market, MD (Obstetrics and Gynecology) Chucky May, MD as Consulting Physician (Psychiatry)  Indicate any recent Medical Services you may have received from other than [facility] providers in the past year (date may be approximate).     Assessment:   This is a routine wellness examination for Abigayle.  Hearing/Vision screen Hearing Screening - Comments:: Pt denies hearing difficulty Vision Screening - Comments:: Annual vision screenings done   Dietary issues and exercise activities discussed: Current Exercise Habits: Home exercise routine, Type of exercise: walking, Time (Minutes): 20, Frequency (Times/Week): 7, Weekly Exercise (Minutes/Week): 140, Intensity: Mild, Exercise limited by: orthopedic condition(s);neurologic condition(s)   Goals Addressed             This Visit's Progress    Patient Stated       Patient states she would like to manage pain and eventually be pain free        Depression Screen PHQ 2/9 Scores 08/25/2021 07/04/2021 04/08/2021 01/08/2021 12/17/2020 10/14/2020 08/14/2020  PHQ - 2 Score 4 5 5 4 6 4 4   PHQ- 9 Score 19 21 18 11 16 19 21    Exception Documentation - - - - - - -  Some encounter information is confidential and restricted. Go to Review Flowsheets activity to see all data.    Fall Risk Fall Risk  08/25/2021 07/04/2021 04/08/2021 01/08/2021 01/08/2021  Falls in the past year? 1 1 1 1 1   Number falls in past yr: 1 1 1 1  -  Comment - - - - -  Injury with Fall? 1 1 0 1 -  Comment - - - - -  Risk for fall due to : History of fall(s);Impaired balance/gait History of fall(s) History of fall(s) - -  Follow up - Falls evaluation completed Falls evaluation completed Falls evaluation completed Falls evaluation completed    FALL RISK PREVENTION PERTAINING TO THE HOME:  Any stairs in or around the home? Yes  If so, are there any without handrails? No  Home free of loose throw rugs in walkways, pet beds, electrical cords, etc? Yes  Adequate lighting in your home to reduce risk of falls? Yes   ASSISTIVE DEVICES UTILIZED TO PREVENT FALLS:  Life alert? Yes  Use of a cane, walker or w/c? Yes  Grab bars in the bathroom? Yes  Shower chair or bench in shower? Yes  Elevated toilet seat or a handicapped toilet? Yes   TIMED UP AND GO:  Was the test performed? No . Telephonic visit.   Cognitive status assessed by direct observation. Pt states difficulty with memory due to psychological problems. 6CIT not completed today.  Cognitive Function: MMSE - Mini Mental State Exam 02/20/2020  Orientation to time 3  Orientation to Place 5  Registration 3  Attention/ Calculation 5  Recall 3  Language- name 2 objects 2  Language- repeat 1  Language- follow 3 step command 3  Language- read & follow direction 1  Write a sentence 0  Copy design 1  Total score 27        Immunizations Immunization History  Administered Date(s) Administered   Influenza,inj,Quad PF,6+ Mos 07/09/2018, 08/09/2019, 08/14/2020, 07/30/2021   Influenza-Unspecified 08/13/2017, 07/09/2018, 08/09/2019   Moderna Sars-Covid-2 Vaccination 03/06/2020,  03/31/2020   Tdap 08/07/2011, 09/18/2019   Zoster Recombinat (Shingrix) 04/18/2021, 07/04/2021    TDAP status: Up to date  Flu Vaccine status: Up to date  Pneumococcal vaccine status: Due, Education has been provided regarding the importance of this vaccine. Advised may receive this vaccine at local pharmacy or Health Dept. Aware to provide a copy of the vaccination record if obtained from local pharmacy or Health Dept. Verbalized acceptance and understanding.  Covid-19 vaccine status: Completed vaccines  Qualifies for Shingles Vaccine? Yes   Zostavax completed No   Shingrix Completed?: Yes  Screening Tests Health Maintenance  Topic Date Due   Pneumococcal Vaccine 44-74 Years old (1 - PCV) Never done   HIV Screening  Never done   COVID-19 Vaccine (3 - Moderna risk series) 04/28/2020   Hepatitis C Screening  01/08/2022 (Originally 02/20/1984)   MAMMOGRAM  11/14/2021   COLONOSCOPY (Pts 45-75yrs Insurance coverage will need to be confirmed)  04/05/2022   TETANUS/TDAP  09/17/2029   INFLUENZA VACCINE  Completed   Zoster Vaccines- Shingrix  Completed   HPV VACCINES  Aged Out    Health Maintenance  Health Maintenance Due  Topic Date Due   Pneumococcal Vaccine 9-69 Years old (1 - PCV) Never done   HIV Screening  Never done   COVID-19 Vaccine (3 - Moderna risk series) 04/28/2020    Colorectal cancer screening: Type of screening: Colonoscopy. Completed 04/05/17. Repeat every 5 years  Mammogram status: Completed 11/14/20. Repeat every year  Bone density status: due. Pt given phone # to call to schedule.   Lung Cancer Screening: (Low Dose CT Chest recommended if Age 19-80 years, 30 pack-year currently smoking OR have quit w/in 15years.) does not qualify.   Additional Screening:  Hepatitis C Screening: does qualify; postponed  Vision Screening: Recommended annual ophthalmology exams for early detection of glaucoma and other disorders of the eye. Is the patient up to date with  their annual eye exam?  Yes  Who is the provider or what is the name of the office in which the patient attends annual eye exams? [facility].   Dental Screening: Recommended annual dental exams for proper oral hygiene  Community Resource Referral / Chronic Care Management: CRR required this visit?  Yes   CCM required this visit?  Yes      Plan:     I have personally reviewed and noted the following in the patient's chart:   Medical and social history Use of alcohol, tobacco or illicit drugs  Current medications and supplements including opioid prescriptions. Patient is not currently taking opioid prescriptions. Functional ability and status Nutritional status Physical activity Advanced directives List of other physicians Hospitalizations, surgeries, and ER visits in previous 12 months Vitals Screenings to include cognitive, depression, and falls Referrals and appointments  In addition, I have reviewed and discussed with patient certain preventive protocols, quality metrics, and best practice recommendations. A  written personalized care plan for preventive services as well as general preventive health recommendations were provided to patient.     Clemetine Marker, LPN   [id number]   Nurse Notes: pt c/o pain in hips, back, neck and arms. Pt does not currently see pain management but plan to discuss referral with Dr. Army Melia at next visit. Pt is also interested in home care to sit with her when needed because she is afraid of passing out; advised to discuss with PCP for referral.

## 2021-08-27 ENCOUNTER — Telehealth: Payer: Self-pay

## 2021-08-27 NOTE — Telephone Encounter (Signed)
Pt called to inquire about referral for community resources. Pt advised care will contact and call from a [facility] phone # and will leave message if patient is unable to answer. Pt aware and appreciative of call.

## 2021-08-27 NOTE — Telephone Encounter (Signed)
Copied from CRM [id number]. Topic: General - Other >> Aug 27, 2021  2:38 PM Laural Benes, [place] C wrote: Reason for CRM: pt called in to request a call back from the medicare wellness nurse in regards to her appt.   Please assist pt further.

## 2021-09-02 ENCOUNTER — Ambulatory Visit: Payer: Medicare Other | Admitting: Pulmonary Disease

## 2021-09-03 ENCOUNTER — Telehealth: Payer: Self-pay

## 2021-09-03 NOTE — Telephone Encounter (Signed)
-----   Message from Chi Mechele Collin, MD sent at 09/03/2021 11:02 AM EST ----- Regarding: Follow-up Patient cancelled appointment on 09/02/21. Please contact to re-schedule for follow-up

## 2021-09-05 NOTE — Telephone Encounter (Signed)
Left voicemail for patient to call back to reschedule 1 month follow up with Dr. Everardo All.

## 2021-09-08 NOTE — Telephone Encounter (Signed)
LMTCB  Will send patient a letter per protocol.   Will close encounter.

## 2021-09-09 ENCOUNTER — Telehealth: Payer: Self-pay

## 2021-09-09 NOTE — Telephone Encounter (Signed)
Copied from CRM [id number]. Topic: General - Inquiry >> Sep 09, 2021 12:45 PM Aretta Nip wrote: Pls have office call pt as she is very upset re a phone call from a pharmacist on behalf of office, She thinks it may be a scam . She has an appt tomorrow at 2:00 with a Brittany,she states who has called her/. I see that there is a letter sent out from Pulmonary from a [place] stating they are trying to reach her. She had called and talked to someone at [facility] and they told her it maybe a scam.She has now gotten another call.  I told her the office would return call to verify as caller said it was on behalf of office. [phone]

## 2021-09-10 NOTE — Telephone Encounter (Signed)
Called to inform patient of message. 

## 2021-09-15 ENCOUNTER — Telehealth: Payer: Self-pay | Admitting: *Deleted

## 2021-09-15 NOTE — Telephone Encounter (Signed)
   Telephone encounter was:  Successful.  09/15/2021 Name: Jennifer Bright MRN: [id number] DOB: May 26, 1966  Jennifer Bright is a 55 y.o. year old female who is a primary care patient of Judithann Graves Nyoka Cowden, MD . The community resource team was consulted for assistance with Needs to talk with Pharmacy to reduce her medicine cost is very worried about being scammed . Will adivse to send a pharmicist consult .  Care guide performed the following interventions: Patient provided with information about care guide support team and interviewed to confirm resource needs Follow up call placed to community resources to determine status of patients referral.  Follow Up Plan:  No further follow up planned at this time. The patient has been provided with needed resources.  Alois Cliche -[facility] Guide , Embedded Care Coordination [facility], Care Management  [phone] [address] [place] , [place] [address] Email : [email or web address]

## 2021-09-17 ENCOUNTER — Encounter: Payer: Self-pay | Admitting: Pulmonary Disease

## 2021-09-17 ENCOUNTER — Ambulatory Visit (INDEPENDENT_AMBULATORY_CARE_PROVIDER_SITE_OTHER): Payer: Medicare Other | Admitting: Pulmonary Disease

## 2021-09-17 ENCOUNTER — Other Ambulatory Visit: Payer: Self-pay

## 2021-09-17 VITALS — BP 98/68 | HR 90 | Temp 98.3°F | Ht 67.0 in | Wt 169.2 lb

## 2021-09-17 DIAGNOSIS — D869 Sarcoidosis, unspecified: Secondary | ICD-10-CM | POA: Diagnosis not present

## 2021-09-17 DIAGNOSIS — R0602 Shortness of breath: Secondary | ICD-10-CM | POA: Diagnosis not present

## 2021-09-17 MED ORDER — PREDNISONE 10 MG PO TABS: ORAL_TABLET | ORAL | 0 refills | Status: AC

## 2021-09-17 NOTE — Progress Notes (Signed)
Subjective:   PATIENT ID: Jennifer Bright GENDER: female DOB: 1966/08/06, MRN: [id number]   HPI  Chief Complaint  Patient presents with   Follow-up    Fall Saturday in the shower landed on her right side.  She is having pain in her chest and can feel it in her back when she takes a deep breath.  Pain is there all the time for 2 weeks.   Reason for Visit: Follow-up for sarcoidosis  Jennifer Bright is a 55 year old female with history of sarcoidosis who presents for follow-up. Husband present.  Synopsis: She was diagnosed with sarcoidosis in 2015. She had initially presented to the [facility] ED for kidney stones and chest imaging concerning for lung nodules. She underwent bronchoscopy and which confirmed findings consistent with sarcoid. She was previously seen in [place] in Pulmonary clinic with Dr. Meredeth Ide. Her last PFT was two years ago and CXR 1 year ago. She had bilateral cataract surgery in 2019 and last eye exam in Oct/Nov 2021.   07/18/21 While undergoing PFTs, she had a syncopal episode. EKG with unchanged incomplete RBBB. EMS was called to the office. She declined EMS as she is currently being worked up at [employer]. She wished to be seen in clinic for her sarcoid follow-up.  She reports that her chronic medications lead her to have suicidal ideation and seeing a psychiatrist, last seen in the two weeks. Prior to this visit, she has been working on being more active. She is walking one mile daily at a steady/brisk pace. Denies shortness of breath, cough or wheezing. Only requires 1-2 breaks for a few minutes which is better compared to six weeks ago. She does have shortness of breath and chest pain which is triggered by stress and anxiety. Does require lasix for lower extremity edema  09/17/21 She is present with her husband. She reports her shortness of breath has worsened in the last few weeks. She has been recently stressed out with family (mom hospitalized)  and this will trigger her dyspnea. She is active by walking daily but continues to have fatigue and sleepiness. Sleep test in 06/2021 was negative. Still has non-restorative sleep. Previously on psych meds however self-discontinued. Currently not seeing psychiatry. She reports hearing voices will keep her awake. Denies fevers, chills, chest pain/discomfort.  Social History: Quit in 2018. Smoked 1 ppd x 4 years.  Environmental exposures:  Retail  Past Medical History:  Diagnosis Date   Acid reflux    Anemia    Anxiety    Bipolar 1 disorder, manic, moderate (HCC) 09/19/2019   Bipolar depression (HCC) 05/12/2015   Bipolar disorder (HCC)    Bipolar disorder (HCC) 05/12/2015   Bleach ingestion 06/27/2020   Cataract    Chronic kidney disease    STAGE 3   Complication of anesthesia    Felt cataract procedures   Depression    Diabetes mellitus    NIDD   Essential tremor    Family history of adverse reaction to anesthesia    Mother - slow to wake   Family history of breast cancer    doesn't meet medicare guidelines for cancer genetic testing.    Family history of ovarian cancer    Pt doesn't meet Medicare genetic testing guidelines   Hyperlipidemia    Hypothyroid    IBS (irritable bowel syndrome)    Injury by caustic substances, except poisoning, undetermined whether accidentally or purposely inflicted    Migraine headache    vestibular  migraine   Obesity    Osteopenia    Restless leg syndrome    Sarcoidosis    Self-inflicted laceration of left wrist (HCC) 09/19/2019   Sleep apnea    Syncope \     Allergies  Allergen Reactions   Cleocin [Clindamycin Hcl] Other (See Comments)    GI distress     Outpatient Medications Prior to Visit  Medication Sig Dispense Refill   ALPRAZolam (XANAX XR) 1 MG 24 hr tablet Take 1 mg by mouth daily. Dr. Toy Care     ALPRAZolam Duanne Moron) 1 MG tablet Take 1 mg by mouth at bedtime as needed for anxiety.     amphetamine-dextroamphetamine (ADDERALL XR) 30  MG 24 hr capsule Take 30 mg by mouth 2 (two) times daily.     atorvastatin (LIPITOR) 40 MG tablet Take 40 mg by mouth at bedtime. Prescribed by PCP     benztropine (COGENTIN) 1 MG tablet Take 1 mg by mouth 2 (two) times daily. Dr. Toy Care     Cholecalciferol (VITAMIN D3) 50 MCG (2000 UT) capsule Take 2,000 Units by mouth daily.     cyclobenzaprine (FLEXERIL) 10 MG tablet Take 10 mg by mouth 3 (three) times daily as needed.     Docusate Sodium (DSS) 100 MG CAPS Take 100 mg by mouth daily.      DULoxetine (CYMBALTA) 60 MG capsule Take 60 mg by mouth daily. Dr. Toy Care     furosemide (LASIX) 20 MG tablet Take 1 tablet (20 mg total) by mouth daily as needed. Prescribed by PCP 30 tablet 5   gabapentin (NEURONTIN) 300 MG capsule Take 1 capsule by mouth in the morning and at bedtime.     hydrOXYzine (VISTARIL) 25 MG capsule Take 25 mg by mouth 3 (three) times daily as needed.     levothyroxine (SYNTHROID) 100 MCG tablet TAKE 1 TABLET EVERY DAY ON EMPTY STOMACHWITH A GLASS OF WATER AT LEAST 30-60 MINBEFORE BREAKFAST (Patient taking differently: Take 100 mcg by mouth daily before breakfast.) 30 tablet 3   Melatonin 10 MG TABS Take 10 mg by mouth at bedtime.     Multiple Vitamin (MULTIVITAMIN) capsule Take by mouth.     Omega-3 300 MG CAPS Take 1.2 g by mouth daily.      pantoprazole (PROTONIX) 40 MG tablet Take 1 tablet (40 mg total) by mouth 2 (two) times daily. (Patient taking differently: Take 40 mg by mouth daily.) 60 tablet 5   primidone (MYSOLINE) 50 MG tablet TAKE 1 TABLET BY MOUTH  TWICE DAILY AT 10 AM AND 5  PM AND 2 TABLETS BY MOUTH  AT BEDTIME 360 tablet 0   rOPINIRole (REQUIP) 1 MG tablet Take 1.5 mg by mouth at bedtime. Dr. Raul Del     topiramate (TOPAMAX) 200 MG tablet TAKE ONE TABLET BY MOUTH EVERY MORNING AND TAKE ONE TABLET EVERY EVENING 180 tablet 0   traZODone (DESYREL) 100 MG tablet Take 150-200 mg by mouth at bedtime. Dr Toy Care     ziprasidone (GEODON) 60 MG capsule Take 120 mg by mouth every  evening. Dr. Toy Care     meloxicam (MOBIC) 7.5 MG tablet Take 7.5 mg by mouth daily. PRN (Patient not taking: Reported on 09/17/2021)     No facility-administered medications prior to visit.    Review of Systems  Constitutional:  Negative for chills, diaphoresis, fever, malaise/fatigue and weight loss.  HENT:  Negative for congestion.   Respiratory:  Positive for shortness of breath. Negative for cough, hemoptysis, sputum production and  wheezing.   Cardiovascular:  Negative for chest pain, palpitations and leg swelling.    Objective:   Vitals:   09/17/21 1653  BP: 98/68  Pulse: 90  Temp: 98.3 F (36.8 C)  TempSrc: Oral  SpO2: 98%  Weight: 169 lb 3.2 oz (76.7 kg)  Height: $Remove'5\' 7"'[id number]$  (1.702 m)   SpO2: 98 % O2 Device: None (Room air)  Physical Exam: General: Well-appearing, no acute distress HENT: [place], AT Eyes: EOMI, no scleral icterus Respiratory: Clear to auscultation bilaterally.  No crackles, wheezing or rales Cardiovascular: RRR, -M/R/G, no JVD Extremities:-Edema,-tenderness Neuro: AAO x4, CNII-XII grossly intact Psych: Normal mood, normal affect  Data Reviewed:  Imaging: - CT Chest 05/16/14 - Mediastinal and hilar lymphadenopathy and bilateral airspace opacities associated with patchy GGO - CT Chest 03/26/17 - Stable mediastinal and hilar lymphadenopathy and bilateral airspace opacities consistent with known sarcoidosis - CT Chest 07/16/16 - Progression of pulmonary sarcoid with bilateral nodularity with upper lobe predominance - CT Chest 03/26/17 - Improved of pulmonary sarcoid even compared to CT Chest in 2015 with residual scarring in upper lobes. Adenopathy decreased. - CT Chest 08/08/21 - Stable pulmonary sarcoid with residual scarring in upper labs. No progression  PFT: 07/18/21 FVC 3.22 (93%) FEV1 2.73 (100%) Ratio 85  TLC 73% DLCO 100% Interpretation: Mild restrictive defect. Normal diffusion  Labs: CBC Latest Ref Rng & Units 05/27/2021 04/08/2021 06/25/2020  WBC 4.0 -  10.5 K/uL 5.2 5.6 6.4  Hemoglobin 12.0 - 15.0 g/dL 13.9 13.6 14.4  Hematocrit 36.0 - 46.0 % 41.7 41.0 41.9  Platelets 150.0 - 400.0 K/uL 209.0 202 213   CMP Latest Ref Rng & Units 05/27/2021 05/27/2021 04/08/2021  Glucose 70 - 99 mg/dL - 109(H) 129(H)  BUN 6 - 23 mg/dL - 16 16  Creatinine 0.40 - 1.20 mg/dL - 1.13 1.18(H)  Sodium 135 - 145 mEq/L - 137 142  Potassium 3.5 - 5.1 mEq/L - 3.7 4.2  Chloride 96 - 112 mEq/L - 105 105  CO2 19 - 32 mEq/L - 24 23  Calcium 8.6 - 10.4 mg/dL 9.6 9.6 9.6  Total Protein 6.0 - 8.3 g/dL - 6.9 6.2  Total Bilirubin 0.2 - 1.2 mg/dL - 0.3 <0.2  Alkaline Phos 39 - 117 U/L - 50 64  AST 0 - 37 U/L - 14 16  ALT 0 - 35 U/L - 14 17   EKG 07/18/21 Incomplete RBBB. No evidence ST elevations or TWI  Echocardiogram 08/22/21 - EF 60-65%. Grade I DD. No WMA. No valvular abnormalities  Sleep Test  06/30/21 AHI 1/hr     Assessment & Plan:   Discussion: 55 year old female with pulmonary sarcoid who presents for follow-up. We reviewed CT and echocardiogram which is negative for overt signs of sarcoid involvement. Her subjective dyspnea is worsening. We will trial brief prednisone taper. If she has any relief with that we will start longer steroid course. Will also order Cardiac MRI after discussing case with Dr. Haroldine Laws.  Pulmonary sarcoidosis with restrictive lung defect - Dx in 2015 via endobronchial bx - Previously on prednisone for 1 year in 2015. - Annual PFTs. Next due 06/2022 - Recommend annual ophthalmology exam.  Last visit on 07/2020 - EKG reviewed. Unchanged RBBB - ORDER CBC, CMET, ACE. Stage III CKD. Following Nephrology  Shortness of breath Chest pain - EKG reviewed with no acute findings. No ST-elevation or TWI - Echocardiogram with Grade I DD - REFERRAL to Cardiology, Dr. Haroldine Laws - Plan for PET-CT or Cardiac MRI  after I discuss with Cardiology  ADDENDUM: Discussed with Dr. Haroldine Laws and will plan to order Cardiac MRI to rule out sarcoid  involvment  Health Maintenance Immunization History  Administered Date(s) Administered   Influenza,inj,Quad PF,6+ Mos 07/09/2018, 08/09/2019, 08/14/2020, 07/30/2021   Influenza-Unspecified 08/13/2017, 07/09/2018, 08/09/2019   Moderna Sars-Covid-2 Vaccination 03/06/2020, 03/31/2020   Tdap 08/07/2011, 09/18/2019   Zoster Recombinat (Shingrix) 04/18/2021, 07/04/2021   CT Lung Screen - not qualified. Insufficient smoking history  Orders Placed This Encounter  Procedures   MR CARDIAC MORPHOLOGY W WO CONTRAST    -Hx pulmonary sarcoidosis -Rule out cardiac involvement    Standing Status:   Future    Standing Expiration Date:   09/18/2022    Order Specific Question:   If indicated for the ordered procedure, I authorize the administration of contrast media per Radiology protocol    Answer:   Yes    Order Specific Question:   What is the patient's sedation requirement?    Answer:   No Sedation    Order Specific Question:   Does the patient have a pacemaker or implanted devices?    Answer:   No    Order Specific Question:   Preferred imaging location?    Answer:   [facility] (table limit - 500 lbs)   CBC    Standing Status:   Future    Standing Expiration Date:   09/17/2022   Comp Met (CMET)    Standing Status:   Future    Standing Expiration Date:   09/17/2022   Angiotensin converting enzyme    Standing Status:   Future    Standing Expiration Date:   09/17/2022   Ambulatory referral to Cardiology    Referral Priority:   Routine    Referral Type:   Consultation    Referral Reason:   Specialty Services Required    Requested Specialty:   Cardiology    Number of Visits Requested:   1   Meds ordered this encounter  Medications   predniSONE (DELTASONE) 10 MG tablet    Sig: Take 4 tablets (40 mg total) by mouth daily with breakfast for 2 days, THEN 3 tablets (30 mg total) daily with breakfast for 2 days, THEN 2 tablets (20 mg total) daily with breakfast for 2 days, THEN 1 tablet  (10 mg total) daily with breakfast for 2 days.    Dispense:  20 tablet    Refill:  0    Return in about 2 weeks (around 10/01/2021).  I have spent a total time of 38-minutes on the day of the appointment reviewing prior documentation, coordinating care and discussing medical diagnosis and plan with the patient/family. Past medical history, allergies, medications were reviewed. Pertinent imaging, labs and tests included in this note have been reviewed and interpreted independently by me.  Olinda, MD [facility] 09/17/2021 5:32 PM  Office Number [phone]

## 2021-09-17 NOTE — Patient Instructions (Addendum)
Shortness of breath Chest pain - REFERRAL to Cardiology, Dr. Gala Romney - Plan for PET-CT or Cardiac MRI after I discuss with Cardiology - Prednisone taper ordered  Pulmonary sarcoidosis with restrictive lung defect - ORDER CBC, CMET, ACE.   Follow-up with me in Dec 14

## 2021-09-18 ENCOUNTER — Other Ambulatory Visit: Payer: Self-pay | Admitting: Internal Medicine

## 2021-09-18 ENCOUNTER — Encounter: Payer: Self-pay | Admitting: Pulmonary Disease

## 2021-09-20 NOTE — Telephone Encounter (Signed)
Requested medication (s) are on the active medication list: yes  Future visit scheduled: yes, 10/03/21  Notes to clinic:  Linzess is not on current med list, Topamax is not delegated, please assess.      Requested Prescriptions  Pending Prescriptions Disp Refills   LINZESS 145 MCG CAPS capsule [Pharmacy Med Name: LINZESS 145 MCG CAP] 30 capsule     Sig: TAKE ONE CAPSULE EACH MORNING BEFORE BREAKFAST     Gastroenterology: Irritable Bowel Syndrome Passed - 09/18/2021  2:29 PM      Passed - Valid encounter within last 12 months    Recent Outpatient Visits           2 months ago Annual physical exam   [facility] Reubin Milan, MD   5 months ago Hypothyroidism, unspecified type   [facility] Reubin Milan, MD   8 months ago Acute pain of right knee   [facility] Reubin Milan, MD   9 months ago Sacro-iliac pain   [facility] Reubin Milan, MD   11 months ago Type II diabetes mellitus with complication [facility])   [facility] Reubin Milan, MD       Future Appointments             In 1 week Luciano Cutter, MD [facility]   In 1 week Reubin Milan, MD [facility], [facility]             topiramate (TOPAMAX) 200 MG tablet [Pharmacy Med Name: TOPIRAMATE 200 MG TAB] 180 tablet 0    Sig: TAKE ONE TABLET BY MOUTH EVERY MORNING AND TAKE ONE TABLET EVERY EVENING     Not Delegated - Neurology: Anticonvulsants - topiramate & zonisamide Failed - 09/18/2021  2:29 PM      Failed - This refill cannot be delegated      Passed - Cr in normal range and within 360 days    Creatinine  Date Value Ref Range Status  10/10/2014 0.79 0.60 - 1.30 mg/dL Final   Creatinine, Ser  Date Value Ref Range Status  05/27/2021 1.13 0.40 - 1.20 mg/dL Final          Passed - CO2 in normal range and within 360 days    CO2  Date Value Ref Range Status  05/27/2021 24 19 - 32 mEq/L Final   Co2  Date Value Ref  Range Status  10/10/2014 25 21 - 32 mmol/L Final          Passed - Valid encounter within last 12 months    Recent Outpatient Visits           2 months ago Annual physical exam   [facility] Reubin Milan, MD   5 months ago Hypothyroidism, unspecified type   [facility] Reubin Milan, MD   8 months ago Acute pain of right knee   [facility] Reubin Milan, MD   9 months ago Sacro-iliac pain   [facility] Reubin Milan, MD   11 months ago Type II diabetes mellitus with complication [facility])   [facility] Reubin Milan, MD       Future Appointments             In 1 week Luciano Cutter, MD [facility]   In 1 week Judithann Graves Nyoka Cowden, MD [facility]

## 2021-09-23 ENCOUNTER — Other Ambulatory Visit: Payer: Self-pay

## 2021-09-26 ENCOUNTER — Telehealth: Payer: Self-pay | Admitting: Internal Medicine

## 2021-09-26 NOTE — Telephone Encounter (Signed)
Patient called in about calls she keeps getting from a stephanie at [facility] pharmacy, [phone].phone # she says she was told by Dr Jaclynn Guarneri office tt we have never heard of her and not talk to her until we find out who she is

## 2021-09-29 NOTE — Telephone Encounter (Signed)
Left patient a 2nd VM informing her it is okay to speak with this pharmacist. She is trying to assist her with medications and getting medication for cheaper if possible.

## 2021-09-30 ENCOUNTER — Telehealth: Payer: Self-pay

## 2021-09-30 NOTE — Progress Notes (Signed)
[facility] Customer service manager [facility]) Quality Pharmacy Team  [facility]   09/30/2021  Jennifer Bright 1966/06/16 [id number]   Reason for referral: Medication Assistance  Referral source: [facility] RN Current insurance: [facility]  Reason for call: Medication Assistance Referral from [facility] Care Management  Outreach:  Unsuccessful telephone call attempt #4 to patient.   HIPAA compliant voicemail left requesting a return call  Plan:  -I will close [facility] pharmacy case at this time as I have been unable to establish and/or maintain contact with patient. I am happy to assist the patient with medication needs in the future if she wishes to return my call.   Thanks,  Harlon Flor, PharmD Clinical Pharmacist  [facility] [employer] [phone]

## 2021-09-30 NOTE — Telephone Encounter (Signed)
Med Management called asking me to reach out to patient so she will call them and discuss patient assistance.  Called patient and gave her the #- [phone]. Patient said she will call now.

## 2021-10-01 ENCOUNTER — Telehealth: Payer: Self-pay | Admitting: *Deleted

## 2021-10-01 ENCOUNTER — Telehealth: Payer: Medicare Other | Admitting: Pulmonary Disease

## 2021-10-01 NOTE — Telephone Encounter (Signed)
° °  Telephone encounter was:  Unsuccessful.  10/01/2021 Name: Jennifer Bright MRN: [id number] DOB: 11-Jun-1966  Unsuccessful outbound call made today to assist with:  Left message to call PCP to reschedule pharmacy assitancxe patient is very concerned about identity theft and needs reassurance the person is from the practice   Outreach Attempt:  3rd Attempt.  Referral closed unable to contact patient.  A HIPAA compliant voice message was left requesting a return call.  Instructed patient to call back at PCP office . Alois Cliche -[facility] Guide , Embedded Care Coordination [facility], Care Management  [phone] [address] [place] , [place] [address] Email : [email or web address]

## 2021-10-03 ENCOUNTER — Telehealth: Payer: Self-pay

## 2021-10-03 ENCOUNTER — Ambulatory Visit: Payer: Medicare Other | Admitting: Internal Medicine

## 2021-10-03 NOTE — Telephone Encounter (Signed)
Called pt as a reminder to get bone density done. Pt verbalized understanding. Gave pt number to call.  KP

## 2021-10-07 ENCOUNTER — Other Ambulatory Visit: Payer: Self-pay

## 2021-10-07 DIAGNOSIS — Z13820 Encounter for screening for osteoporosis: Secondary | ICD-10-CM

## 2021-10-07 NOTE — Telephone Encounter (Signed)
See previous note.  [facility] nurse may give results to patient if they return call to clinic, a CRM has been created.

## 2021-10-07 NOTE — Telephone Encounter (Signed)
Called pt left VM that her order for the bone density has been released. She can call the same number to schedule an appt.  KP

## 2021-10-07 NOTE — Telephone Encounter (Addendum)
Pt called [phone] and they had not received a referral for bone density scan/ please advise if this was correct number for her to schedule bone density

## 2021-10-09 ENCOUNTER — Encounter: Payer: Self-pay | Admitting: Internal Medicine

## 2021-10-10 ENCOUNTER — Ambulatory Visit: Payer: Self-pay | Admitting: Internal Medicine

## 2021-10-11 ENCOUNTER — Other Ambulatory Visit: Payer: Self-pay | Admitting: Internal Medicine

## 2021-10-11 NOTE — Telephone Encounter (Signed)
Requested medication (s) are due for refill today: yes  Requested medication (s) are on the active medication list: yes  Last refill:  09/22/21 #60  Future visit scheduled: yes  Notes to clinic:  med not delegated to NT to RF   Requested Prescriptions  Pending Prescriptions Disp Refills   topiramate (TOPAMAX) 200 MG tablet [Pharmacy Med Name: TOPIRAMATE 200 MG TAB] 60 tablet 0    Sig: TAKE ONE TABLET BY MOUTH EVERY MORNING AND TAKE ONE TABLET EVERY EVENING     Not Delegated - Neurology: Anticonvulsants - topiramate & zonisamide Failed - 10/11/2021  8:33 AM      Failed - This refill cannot be delegated      Passed - Cr in normal range and within 360 days    Creatinine  Date Value Ref Range Status  10/10/2014 0.79 0.60 - 1.30 mg/dL Final   Creatinine, Ser  Date Value Ref Range Status  05/27/2021 1.13 0.40 - 1.20 mg/dL Final          Passed - CO2 in normal range and within 360 days    CO2  Date Value Ref Range Status  05/27/2021 24 19 - 32 mEq/L Final   Co2  Date Value Ref Range Status  10/10/2014 25 21 - 32 mmol/L Final          Passed - Valid encounter within last 12 months    Recent Outpatient Visits           3 months ago Annual physical exam   [facility] Reubin Milan, MD   6 months ago Hypothyroidism, unspecified type   [facility] Reubin Milan, MD   9 months ago Acute pain of right knee   [facility] Reubin Milan, MD   9 months ago Sacro-iliac pain   [facility] Reubin Milan, MD   12 months ago Type II diabetes mellitus with complication [facility])   [facility] Reubin Milan, MD       Future Appointments             In 1 week Judithann Graves Nyoka Cowden, MD [facility], [facility]   In 3 weeks Luciano Cutter, MD [facility]

## 2021-10-16 ENCOUNTER — Telehealth: Payer: Self-pay | Admitting: Internal Medicine

## 2021-10-16 NOTE — Telephone Encounter (Signed)
Copied from CRM [id number]. Topic: Appointment Scheduling - Scheduling Inquiry for Clinic >> Oct 16, 2021  2:04 PM Jennifer Bright wrote: Reason for CRM: Pt has an appt for 1.3.23. and pt lives near the [facility] on [address] and wants to know if she can get orders to have labs done there tomorrow and discuss results during the January 3rd appt / pt also needs the Bone density provider/dept information/ she is not sure if she scheduled it yet but has lost the info / please advise asap

## 2021-10-16 NOTE — Telephone Encounter (Signed)
Please advise for lab orders to obtain before visit on 10/21/21 if appropriate.  DXA scan ordered 04/08/21, but has not been scheduled yet.

## 2021-10-17 ENCOUNTER — Other Ambulatory Visit: Payer: Self-pay | Admitting: Internal Medicine

## 2021-10-17 DIAGNOSIS — E782 Mixed hyperlipidemia: Secondary | ICD-10-CM

## 2021-10-17 DIAGNOSIS — R7303 Prediabetes: Secondary | ICD-10-CM

## 2021-10-17 NOTE — Telephone Encounter (Signed)
Requested Prescriptions  Pending Prescriptions Disp Refills   pantoprazole (PROTONIX) 40 MG tablet [Pharmacy Med Name: PANTOPRAZOLE SODIUM 40 MG DR TAB] 30 tablet     Sig: TAKE ONE TABLET EVERY DAY 15-20 MINUTES BEFORE A MEAL     Gastroenterology: Proton Pump Inhibitors Passed - 10/17/2021  4:50 PM      Passed - Valid encounter within last 12 months    Recent Outpatient Visits          3 months ago Annual physical exam   [facility] Reubin Milan, MD   6 months ago Hypothyroidism, unspecified type   [facility] Reubin Milan, MD   9 months ago Acute pain of right knee   [facility] Reubin Milan, MD   10 months ago Sacro-iliac pain   [facility] Reubin Milan, MD   1 year ago Type II diabetes mellitus with complication [facility])   [facility] Reubin Milan, MD      Future Appointments            In 4 days Reubin Milan, MD [facility], [facility]   In 2 weeks Luciano Cutter, MD [facility]

## 2021-10-17 NOTE — Telephone Encounter (Signed)
Patient informed they need to be fasting. She has already ate today. Will just get labs Tuesday.

## 2021-10-21 ENCOUNTER — Encounter: Payer: Self-pay | Admitting: Internal Medicine

## 2021-10-21 ENCOUNTER — Other Ambulatory Visit: Payer: Self-pay | Admitting: Pulmonary Disease

## 2021-10-21 ENCOUNTER — Ambulatory Visit (INDEPENDENT_AMBULATORY_CARE_PROVIDER_SITE_OTHER): Payer: Medicare Other | Admitting: Internal Medicine

## 2021-10-21 ENCOUNTER — Other Ambulatory Visit: Payer: Self-pay

## 2021-10-21 VITALS — BP 112/60 | HR 94 | Ht 67.0 in | Wt 154.0 lb

## 2021-10-21 DIAGNOSIS — E782 Mixed hyperlipidemia: Secondary | ICD-10-CM

## 2021-10-21 DIAGNOSIS — E039 Hypothyroidism, unspecified: Secondary | ICD-10-CM | POA: Diagnosis not present

## 2021-10-21 DIAGNOSIS — R7303 Prediabetes: Secondary | ICD-10-CM | POA: Diagnosis not present

## 2021-10-21 DIAGNOSIS — F3181 Bipolar II disorder: Secondary | ICD-10-CM

## 2021-10-21 DIAGNOSIS — M62838 Other muscle spasm: Secondary | ICD-10-CM

## 2021-10-21 DIAGNOSIS — G43709 Chronic migraine without aura, not intractable, without status migrainosus: Secondary | ICD-10-CM

## 2021-10-21 DIAGNOSIS — D869 Sarcoidosis, unspecified: Secondary | ICD-10-CM | POA: Diagnosis not present

## 2021-10-21 DIAGNOSIS — K5904 Chronic idiopathic constipation: Secondary | ICD-10-CM | POA: Diagnosis not present

## 2021-10-21 MED ORDER — TOPIRAMATE 200 MG PO TABS: ORAL_TABLET | ORAL | 5 refills | Status: AC

## 2021-10-21 MED ORDER — CYCLOBENZAPRINE HCL 10 MG PO TABS: 10.0000 mg | ORAL_TABLET | Freq: Three times a day (TID) | ORAL | 1 refills | Status: DC | PRN

## 2021-10-21 MED ORDER — LEVOTHYROXINE SODIUM 100 MCG PO TABS: 100.0000 ug | ORAL_TABLET | Freq: Every day | ORAL | 1 refills | Status: AC

## 2021-10-21 MED ORDER — LINACLOTIDE 145 MCG PO CAPS: ORAL_CAPSULE | ORAL | 1 refills | Status: DC

## 2021-10-21 NOTE — Progress Notes (Signed)
Date:  10/21/2021   Name:  Jennifer Bright   DOB:  May 16, 1966   MRN:  [id number]   Chief Complaint: Diabetes and Hyperlipidemia  Diabetes She presents for her follow-up diabetic visit. Diabetes type: prediabetes. Hypoglycemia symptoms include nervousness/anxiousness. Pertinent negatives for hypoglycemia include no dizziness or headaches. Pertinent negatives for diabetes include no chest pain, no fatigue and no weakness. Symptoms are stable.  Hyperlipidemia This is a chronic problem. Recent lipid tests were reviewed and are high. Pertinent negatives include no chest pain or shortness of breath. Current antihyperlipidemic treatment includes statins.  Constipation This is a chronic problem. The stool is described as firm. The patient is not on a high fiber diet. She Exercises regularly. There has Been adequate water intake. Pertinent negatives include no abdominal pain or diarrhea. Treatments tried: linzess. The treatment provided moderate relief.  Thyroid Problem Presents for follow-up visit. Symptoms include anxiety and constipation. Patient reports no diarrhea, fatigue or palpitations. The symptoms have been stable. Her past medical history is significant for hyperlipidemia.  Neck Pain  This is a chronic problem. The current episode started more than 1 month ago. The problem has been gradually worsening. The pain is associated with lifting a heavy object (helping care for her sick mother). The pain is moderate. The symptoms are aggravated by twisting and bending. The pain is Same all the time. Pertinent negatives include no chest pain, headaches, trouble swallowing or weakness. She has tried NSAIDs and heat for the symptoms. The treatment provided no relief.   Lab Results  Component Value Date   NA 137 05/27/2021   K 3.7 05/27/2021   CO2 24 05/27/2021   GLUCOSE 109 (H) 05/27/2021   BUN 16 05/27/2021   CREATININE 1.13 05/27/2021   CALCIUM 9.6 05/27/2021   CALCIUM 9.6 05/27/2021   EGFR  55 (L) 04/08/2021   GFRNONAA 56 (L) 10/14/2020   Lab Results  Component Value Date   CHOL 282 (H) 04/08/2021   HDL 48 04/08/2021   LDLCALC 182 (H) 04/08/2021   TRIG 269 (H) 04/08/2021   CHOLHDL 5.9 (H) 04/08/2021   Lab Results  Component Value Date   TSH 0.669 04/08/2021   Lab Results  Component Value Date   HGBA1C 6.0 (H) 04/08/2021   Lab Results  Component Value Date   WBC 5.2 05/27/2021   HGB 13.9 05/27/2021   HCT 41.7 05/27/2021   MCV 89.2 05/27/2021   PLT 209.0 05/27/2021   Lab Results  Component Value Date   ALT 14 05/27/2021   AST 14 05/27/2021   ALKPHOS 50 05/27/2021   BILITOT 0.3 05/27/2021   Lab Results  Component Value Date   VD25OH 30.25 05/27/2021     Review of Systems  Constitutional:  Positive for unexpected weight change. Negative for chills and fatigue.  HENT:  Negative for nosebleeds and trouble swallowing.   Eyes:  Negative for visual disturbance.  Respiratory:  Negative for cough, chest tightness, shortness of breath and wheezing.   Cardiovascular:  Negative for chest pain, palpitations and leg swelling.  Gastrointestinal:  Positive for constipation. Negative for abdominal pain and diarrhea.  Musculoskeletal:  Positive for neck pain and neck stiffness.  Neurological:  Negative for dizziness, weakness, light-headedness and headaches.  Psychiatric/Behavioral:  Positive for dysphoric mood. Negative for sleep disturbance. The patient is nervous/anxious.    Patient Active Problem List   Diagnosis Date Noted   Restrictive lung disease 07/18/2021   Shortness of breath 06/29/2021   Lumbar radiculopathy 05/27/2021  Gastritis without bleeding    S/P total abdominal hysterectomy and bilateral salpingo-oophorectomy 08/14/2020   Chronic idiopathic constipation 08/14/2020   Depression, major, recurrent, severe with psychosis ([place]) 06/26/2020   Seizure-like activity ([place]) 06/04/2020   Urinary incontinence without sensory awareness 05/21/2020    Bipolar 2 disorder, major depressive episode ([place]) 09/19/2019   Stage 3a chronic kidney disease ([place]) 07/10/2019   Proteinuria 07/10/2019   Myalgia 12/30/2017   Vaginal atrophy 07/12/2017   Polypharmacy 08/14/2016   Sarcoidosis 08/14/2016   Hypercalcemia 07/24/2016   Mixed hyperlipidemia 11/15/2015   Prediabetes 11/15/2015   Anxiety disorder 07/29/2015   Benign essential tremor 05/12/2015   GERD with esophagitis 05/12/2015   Hypersomnia with sleep apnea 05/12/2015   Restless leg 05/12/2015   Acquired hypothyroidism 05/12/2015   Chronic migraine without aura 09/03/2012   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    Allergies  Allergen Reactions   Cleocin [Clindamycin Hcl] Other (See Comments)    GI distress    Past Surgical History:  Procedure Laterality Date   ABDOMINAL HYSTERECTOMY     APPENDECTOMY     BLADDER SURGERY     bladder tact   CATARACT EXTRACTION W/PHACO Right 09/21/2018   Procedure: CATARACT EXTRACTION PHACO AND INTRAOCULAR LENS PLACEMENT ([place]);  Surgeon: [name], MD;  Location: [facility] ORS;  Service: Ophthalmology;  Laterality: Right;  [place]  00:20 CDE 00.82 Fluid pack lot # [id number]   CATARACT EXTRACTION W/PHACO Left 10/17/2018   Procedure: CATARACT EXTRACTION PHACO AND INTRAOCULAR LENS PLACEMENT ([place])  LEFT;  Surgeon: [name], MD;  Location: [place];  Service: Ophthalmology;  Laterality: Left;  diabetic - diet controlled   CESAREAN SECTION     CHOLECYSTECTOMY     COLONOSCOPY WITH PROPOFOL N/A 04/05/2017   Procedure: COLONOSCOPY WITH PROPOFOL;  Surgeon: [name], MD;  Location: [facility] ENDOSCOPY;  Service: Endoscopy;  Laterality: N/A;   ESOPHAGOGASTRODUODENOSCOPY (EGD) WITH PROPOFOL N/A 08/28/2016   Procedure: ESOPHAGOGASTRODUODENOSCOPY (EGD) WITH PROPOFOL;  Surgeon: [name], MD;  Location: [facility] ENDOSCOPY;  Service: Endoscopy;  Laterality: N/A;   ESOPHAGOGASTRODUODENOSCOPY (EGD) WITH PROPOFOL N/A 11/08/2020    Procedure: ESOPHAGOGASTRODUODENOSCOPY (EGD) WITH PROPOFOL w// biopsy;  Surgeon: [name], MD;  Location: [place];  Service: Endoscopy;  Laterality: N/A;  Pre-Diabetic   HERNIA REPAIR     INGUINAL HERNIA REPAIR     rt and left   lexiscan cardiolite     TILT TABLE STUDY N/A 04/01/2012   Procedure: TILT TABLE STUDY;  Surgeon: Deboraha Sprang, MD;  Location: [facility] CATH LAB;  Service: Cardiovascular;  Laterality: N/A;    Social History   Tobacco Use   Smoking status: Former    Types: Cigarettes    Start date: 2011    Quit date: 2018    Years since quitting: 5.0   Smokeless tobacco: Never  Vaping Use   Vaping Use: Never used  Substance Use Topics   Alcohol use: No   Drug use: No     Medication list has been reviewed and updated.  Current Meds  Medication Sig   ALPRAZolam (XANAX XR) 1 MG 24 hr tablet Take 1 mg by mouth daily. Dr. Toy Care   ALPRAZolam Duanne Moron) 1 MG tablet Take 1 mg by mouth at bedtime as needed for anxiety.   amphetamine-dextroamphetamine (ADDERALL XR) 30 MG 24 hr capsule Take 30 mg by mouth daily.   atorvastatin (LIPITOR) 40 MG tablet Take 40 mg by mouth at bedtime. Prescribed by PCP   benztropine (COGENTIN)  1 MG tablet Take 1 mg by mouth 2 (two) times daily. Dr. Toy Care   Cholecalciferol (VITAMIN D3) 50 MCG (2000 UT) capsule Take 2,000 Units by mouth daily.   cyclobenzaprine (FLEXERIL) 10 MG tablet Take 10 mg by mouth 3 (three) times daily as needed.   Docusate Sodium (DSS) 100 MG CAPS Take 100 mg by mouth daily.    DULoxetine (CYMBALTA) 60 MG capsule Take 60 mg by mouth daily. Dr. Toy Care   furosemide (LASIX) 20 MG tablet Take 1 tablet (20 mg total) by mouth daily as needed. Prescribed by PCP   gabapentin (NEURONTIN) 300 MG capsule Take 1 capsule by mouth 3 (three) times daily. 1 in the AM, 2 in the PM   hydrOXYzine (VISTARIL) 25 MG capsule Take 25 mg by mouth 3 (three) times daily as needed.   levothyroxine (SYNTHROID) 100 MCG tablet TAKE 1 TABLET EVERY DAY ON  EMPTY STOMACHWITH A GLASS OF WATER AT LEAST 30-60 MINBEFORE BREAKFAST (Patient taking differently: Take 100 mcg by mouth daily before breakfast.)   LINZESS 145 MCG CAPS capsule TAKE ONE CAPSULE EACH MORNING BEFORE BREAKFAST (Patient taking differently: 2 (two) times daily as needed.)   Melatonin 10 MG TABS Take 10 mg by mouth at bedtime.   Multiple Vitamin (MULTIVITAMIN) capsule Take by mouth.   Omega-3 300 MG CAPS Take 1.2 g by mouth daily.    pantoprazole (PROTONIX) 40 MG tablet Take 1 tablet (40 mg total) by mouth 2 (two) times daily.   primidone (MYSOLINE) 50 MG tablet TAKE 1 TABLET BY MOUTH  TWICE DAILY AT 10 AM AND 5  PM AND 2 TABLETS BY MOUTH  AT BEDTIME   rOPINIRole (REQUIP) 1 MG tablet Take 1.5 mg by mouth at bedtime. Dr. Raul Del   topiramate (TOPAMAX) 200 MG tablet TAKE ONE TABLET BY MOUTH EVERY MORNING AND TAKE ONE TABLET EVERY EVENING   traZODone (DESYREL) 100 MG tablet Take 150-200 mg by mouth at bedtime. Dr Toy Care   ziprasidone (GEODON) 60 MG capsule Take 120 mg by mouth 2 (two) times daily with a meal. Dr. Toy Care    [facility] 2/9 Scores 10/21/2021 08/25/2021 07/04/2021 04/08/2021  PHQ - 2 Score 4 4 5 5   PHQ- 9 Score 19 19 21 18   Exception Documentation - - - -  Some encounter information is confidential and restricted. Go to Review Flowsheets activity to see all data.    GAD 7 : Generalized Anxiety Score 10/21/2021 07/04/2021 04/08/2021 01/08/2021  Nervous, Anxious, on Edge 3 3 3 3   Control/stop worrying 3 3 2 3   Worry too much - different things 3 3 2 3   Trouble relaxing 3 3 2 3   Restless 2 2 2 2   Easily annoyed or irritable 3 2 2 2   Afraid - awful might happen 3 3 2 2   Total GAD 7 Score 20 19 15 18   Anxiety Difficulty Somewhat difficult - Somewhat difficult -    BP Readings from Last 3 Encounters:  10/21/21 112/60  09/17/21 98/68  07/18/21 124/74    Physical Exam Vitals and nursing note reviewed.  Constitutional:      General: She is not in acute distress.    Appearance: Normal  appearance. She is well-developed.  HENT:     Head: Normocephalic and atraumatic.  Neck:     Thyroid: No thyroid mass or thyromegaly.     Vascular: No carotid bruit or JVD.  Cardiovascular:     Rate and Rhythm: Normal rate and regular rhythm.  Pulses: Normal pulses.  Pulmonary:     Effort: Pulmonary effort is normal. No respiratory distress.     Breath sounds: No wheezing or rhonchi.  Musculoskeletal:     Cervical back: Spasms and tenderness present. Pain with movement present. Decreased range of motion.     Right lower leg: No edema.     Left lower leg: No edema.  Skin:    General: Skin is warm and dry.     Capillary Refill: Capillary refill takes less than 2 seconds.     Findings: No rash.  Neurological:     Mental Status: She is alert and oriented to person, place, and time.  Psychiatric:        Mood and Affect: Mood normal.        Behavior: Behavior normal.    Wt Readings from Last 3 Encounters:  10/21/21 154 lb (69.9 kg)  09/17/21 169 lb 3.2 oz (76.7 kg)  07/18/21 163 lb 12.8 oz (74.3 kg)    BP 112/60    Pulse 94    Ht $R'5\' 7"'UI$  (1.702 m)    Wt 154 lb (69.9 kg)    SpO2 94%    BMI 24.12 kg/m   Assessment and Plan: 1. Prediabetes Check A1C and advise; recent weight gain will adversely affect this.  2. Chronic idiopathic constipation Managed with Linzess. - linaclotide (LINZESS) 145 MCG CAPS capsule; TAKE ONE CAPSULE EACH MORNING BEFORE BREAKFAST  Dispense: 90 capsule; Refill: 1  3. Mixed hyperlipidemia Check labs and advise on meds  4. Chronic migraine without aura without status migrainosus, not intractable Stable Clinically stable without change in character or frequency of migraine headaches Headaches respond well to current therapy with topamax. Will continue current plan, follow up if worsening. - topiramate (TOPAMAX) 200 MG tablet; TAKE ONE TABLET BY MOUTH EVERY MORNING AND TAKE ONE TABLET EVERY EVENING Strength: 200 mg  Dispense: 60 tablet; Refill: 5  5.  Acquired hypothyroidism supplemented - levothyroxine (SYNTHROID) 100 MCG tablet; Take 1 tablet (100 mcg total) by mouth daily before breakfast.  Dispense: 90 tablet; Refill: 1 - TSH + free T4  6. Bipolar 2 disorder, major depressive episode ([place]) Followed by Psych  7. Muscle spasm Continue heat and advil - cyclobenzaprine (FLEXERIL) 10 MG tablet; Take 1 tablet (10 mg total) by mouth 3 (three) times daily as needed for muscle spasms.  Dispense: 30 tablet; Refill: 1   Partially dictated using Editor, commissioning. Any errors are unintentional.  Halina Maidens, MD [facility]  10/21/2021

## 2021-10-22 ENCOUNTER — Other Ambulatory Visit: Payer: Self-pay

## 2021-10-22 ENCOUNTER — Telehealth: Payer: Self-pay

## 2021-10-22 LAB — COMPREHENSIVE METABOLIC PANEL
ALT: 20 IU/L (ref 0–32)
AST: 16 IU/L (ref 0–40)
Albumin/Globulin Ratio: 2.2 (ref 1.2–2.2)
Albumin: 4.4 g/dL (ref 3.8–4.9)
Alkaline Phosphatase: 61 IU/L (ref 44–121)
BUN/Creatinine Ratio: 16 (ref 9–23)
BUN: 19 mg/dL (ref 6–24)
Bilirubin Total: 0.3 mg/dL (ref 0.0–1.2)
CO2: 19 mmol/L — ABNORMAL LOW (ref 20–29)
Calcium: 9.9 mg/dL (ref 8.7–10.2)
Chloride: 109 mmol/L — ABNORMAL HIGH (ref 96–106)
Creatinine, Ser: 1.21 mg/dL — ABNORMAL HIGH (ref 0.57–1.00)
Globulin, Total: 2 g/dL (ref 1.5–4.5)
Glucose: 125 mg/dL — ABNORMAL HIGH (ref 70–99)
Potassium: 4.3 mmol/L (ref 3.5–5.2)
Sodium: 143 mmol/L (ref 134–144)
Total Protein: 6.4 g/dL (ref 6.0–8.5)
eGFR: 53 mL/min/{1.73_m2} — ABNORMAL LOW (ref >59)

## 2021-10-22 LAB — BASIC METABOLIC PANEL
BUN/Creatinine Ratio: 18 (ref 9–23)
BUN: 20 mg/dL (ref 6–24)
CO2: 20 mmol/L (ref 20–29)
Calcium: 9.8 mg/dL (ref 8.7–10.2)
Chloride: 108 mmol/L — ABNORMAL HIGH (ref 96–106)
Creatinine, Ser: 1.09 mg/dL — ABNORMAL HIGH (ref 0.57–1.00)
Glucose: 126 mg/dL — ABNORMAL HIGH (ref 70–99)
Potassium: 4.1 mmol/L (ref 3.5–5.2)
Sodium: 140 mmol/L (ref 134–144)
eGFR: 60 mL/min/{1.73_m2} (ref >59)

## 2021-10-22 LAB — HEMOGLOBIN A1C
Est. average glucose Bld gHb Est-mCnc: 123 mg/dL
Hgb A1c MFr Bld: 5.9 % — ABNORMAL HIGH (ref 4.8–5.6)

## 2021-10-22 LAB — CBC
Hematocrit: 39.1 % (ref 34.0–46.6)
Hemoglobin: 13.5 g/dL (ref 11.1–15.9)
MCH: 30.3 pg (ref 26.6–33.0)
MCHC: 34.5 g/dL (ref 31.5–35.7)
MCV: 88 fL (ref 79–97)
Platelets: 200 10*3/uL (ref 150–450)
RBC: 4.46 x10E6/uL (ref 3.77–5.28)
RDW: 11.9 % (ref 11.7–15.4)
WBC: 4.9 10*3/uL (ref 3.4–10.8)

## 2021-10-22 LAB — LIPID PANEL
Chol/HDL Ratio: 5.7 ratio — ABNORMAL HIGH (ref 0.0–4.4)
Cholesterol, Total: 333 mg/dL — ABNORMAL HIGH (ref 100–199)
HDL: 58 mg/dL (ref >39)
LDL Chol Calc (NIH): 238 mg/dL — ABNORMAL HIGH (ref 0–99)
Triglycerides: 191 mg/dL — ABNORMAL HIGH (ref 0–149)
VLDL Cholesterol Cal: 37 mg/dL (ref 5–40)

## 2021-10-22 LAB — TSH+FREE T4
Free T4: 1.06 ng/dL (ref 0.82–1.77)
TSH: 2.71 u[IU]/mL (ref 0.450–4.500)

## 2021-10-22 LAB — ANGIOTENSIN CONVERTING ENZYME: Angio Convert Enzyme: 65 U/L (ref 14–82)

## 2021-10-22 MED ORDER — ROSUVASTATIN CALCIUM 5 MG PO TABS: 5.0000 mg | ORAL_TABLET | Freq: Every day | ORAL | 0 refills | Status: DC

## 2021-10-22 NOTE — Telephone Encounter (Signed)
Called Nancy from pt assistance. Left VM for her to call me back.  [facility] nurse may give results to patient if they return call to clinic, a CRM has been created.  KP

## 2021-10-22 NOTE — Telephone Encounter (Signed)
Spoke to pt she stated that Linzess cost to much and she can not offered it. Wants to know if another medication can be sent in. Pt wants it sent to [facility].   KP

## 2021-10-22 NOTE — Progress Notes (Signed)
Error.     KP 

## 2021-10-23 NOTE — Telephone Encounter (Signed)
Spoke to [place] she stated that she sent an application out to pt on 12/27. That she is waiting for the application to be completed and sent back from the patient.  KP

## 2021-10-23 NOTE — Telephone Encounter (Signed)
Spoke to pts husband Harvie Heck told him that I spoke to [place] that she sent a application that needed to be completed and returned. Pts husband said they did get the application and will try to complete and mail it back in the next few days.  KP

## 2021-10-27 ENCOUNTER — Telehealth (HOSPITAL_COMMUNITY): Payer: Self-pay | Admitting: Emergency Medicine

## 2021-10-27 NOTE — Telephone Encounter (Signed)
Attempted to call patient regarding upcoming cardiac MR appointment. Left message on voicemail with name and callback number Keyia Moretto RN Navigator Cardiac Imaging [facility] [phone] Office [phone] Cell  

## 2021-10-28 ENCOUNTER — Other Ambulatory Visit: Payer: Self-pay

## 2021-10-28 ENCOUNTER — Ambulatory Visit (HOSPITAL_COMMUNITY)
Admission: RE | Admit: 2021-10-28 | Discharge: 2021-10-28 | Disposition: A | Discharge status: Home / Self Care | Payer: Medicare Other | Source: Ambulatory Visit | Attending: Pulmonary Disease | Admitting: Pulmonary Disease

## 2021-10-28 DIAGNOSIS — D869 Sarcoidosis, unspecified: Secondary | ICD-10-CM

## 2021-10-28 MED ORDER — GADOBUTROL 1 MMOL/ML IV SOLN
7.0000 mL | Freq: Once | INTRAVENOUS | Status: AC | PRN
  Administered 2021-10-28: 7 mL via INTRAVENOUS

## 2021-10-30 ENCOUNTER — Ambulatory Visit (INDEPENDENT_AMBULATORY_CARE_PROVIDER_SITE_OTHER): Payer: Medicare Other | Admitting: Internal Medicine

## 2021-10-30 ENCOUNTER — Other Ambulatory Visit: Payer: Self-pay

## 2021-10-30 ENCOUNTER — Telehealth: Payer: Self-pay | Admitting: Obstetrics & Gynecology

## 2021-10-30 ENCOUNTER — Other Ambulatory Visit: Payer: Self-pay | Admitting: Obstetrics & Gynecology

## 2021-10-30 ENCOUNTER — Telehealth: Payer: Self-pay

## 2021-10-30 ENCOUNTER — Encounter: Payer: Self-pay | Admitting: Internal Medicine

## 2021-10-30 VITALS — BP 127/84 | HR 97 | Ht 67.0 in | Wt 154.0 lb

## 2021-10-30 DIAGNOSIS — Z1231 Encounter for screening mammogram for malignant neoplasm of breast: Secondary | ICD-10-CM

## 2021-10-30 DIAGNOSIS — Z13820 Encounter for screening for osteoporosis: Secondary | ICD-10-CM | POA: Diagnosis not present

## 2021-10-30 DIAGNOSIS — R490 Dysphonia: Secondary | ICD-10-CM | POA: Diagnosis not present

## 2021-10-30 NOTE — Progress Notes (Signed)
Date:  10/30/2021   Name:  Jennifer Bright   DOB:  1966/03/16   MRN:  [id number]   Chief Complaint: Sore Throat (Voice fluctuates , raspy and deep voice comes and goes )  Sore Throat  This is a new problem. Episode onset: 2 weeks. The problem has been gradually worsening. Neither side of throat is experiencing more pain than the other. There has been no fever. The pain is at a severity of 5/10. The pain is mild. Associated symptoms include a hoarse voice and neck pain.    Clinical Intake:  Pre-visit preparation completed: Yes  Pain : 0-10 Pain Score: 2      Nutritional Status: BMI of 19-24  Normal Diabetes: No  Activities of Daily Living: (!) Needs Assist Feeding: Needs assistance Dressing/Grooming: Needs assistance Bathing: Needs assistance Toileting: Needs assistance Transfer: Needs assistance Ambulation: Needs Assistance Medication Administration: Independent Home Management: Needs assistance (comment)  Barriers to Care Management & Learning: Motor deficits  Do you feel unsafe in your current relationship?: No Do you feel physically threatened by others?: No Anyone hurting you at home, work, or school?: No  How often do you need to have someone help you when you read instructions, pamphlets, or other written materials from your doctor or pharmacy?: 1 - Never  Interpreter Needed?: No  Information entered by :: Wyatt Haste, CMA   Lab Results  Component Value Date   NA 143 10/21/2021   K 4.3 10/21/2021   CO2 19 (L) 10/21/2021   GLUCOSE 125 (H) 10/21/2021   BUN 19 10/21/2021   CREATININE 1.21 (H) 10/21/2021   CALCIUM 9.9 10/21/2021   EGFR 53 (L) 10/21/2021   GFRNONAA 56 (L) 10/14/2020   Lab Results  Component Value Date   CHOL 333 (H) 10/21/2021   HDL 58 10/21/2021   LDLCALC 238 (H) 10/21/2021   TRIG 191 (H) 10/21/2021   CHOLHDL 5.7 (H) 10/21/2021   Lab Results  Component Value Date   TSH 2.710 10/21/2021   Lab Results  Component Value  Date   HGBA1C 5.9 (H) 10/21/2021   Lab Results  Component Value Date   WBC 4.9 10/21/2021   HGB 13.5 10/21/2021   HCT 39.1 10/21/2021   MCV 88 10/21/2021   PLT 200 10/21/2021   Lab Results  Component Value Date   ALT 20 10/21/2021   AST 16 10/21/2021   ALKPHOS 61 10/21/2021   BILITOT 0.3 10/21/2021   Lab Results  Component Value Date   VD25OH 30.25 05/27/2021     Review of Systems  Constitutional:  Negative for chills and fever.  HENT:  Positive for hoarse voice, sore throat and voice change. Negative for hearing loss and postnasal drip.   Cardiovascular:  Negative for chest pain.  Musculoskeletal:  Positive for neck pain.  Psychiatric/Behavioral:  Positive for sleep disturbance.    Patient Active Problem List   Diagnosis Date Noted   Restrictive lung disease 07/18/2021   Shortness of breath 06/29/2021   Lumbar radiculopathy 05/27/2021   Gastritis without bleeding    S/P total abdominal hysterectomy and bilateral salpingo-oophorectomy 08/14/2020   Chronic idiopathic constipation 08/14/2020   Depression, major, recurrent, severe with psychosis ([place]) 06/26/2020   Seizure-like activity ([place]) 06/04/2020   Urinary incontinence without sensory awareness 05/21/2020   Bipolar 2 disorder, major depressive episode ([place]) 09/19/2019   Stage 3a chronic kidney disease ([place]) 07/10/2019   Proteinuria 07/10/2019   Myalgia 12/30/2017   Vaginal atrophy 07/12/2017   Polypharmacy 08/14/2016  Sarcoidosis 08/14/2016   Hypercalcemia 07/24/2016   Mixed hyperlipidemia 11/15/2015   Prediabetes 11/15/2015   Anxiety disorder 07/29/2015   Benign essential tremor 05/12/2015   GERD with esophagitis 05/12/2015   Hypersomnia with sleep apnea 05/12/2015   Restless leg 05/12/2015   Acquired hypothyroidism 05/12/2015   Chronic migraine without aura 09/03/2012   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    Allergies  Allergen Reactions   Cleocin [Clindamycin Hcl] Other (See Comments)    GI  distress    Past Surgical History:  Procedure Laterality Date   ABDOMINAL HYSTERECTOMY     APPENDECTOMY     BLADDER SURGERY     bladder tact   CATARACT EXTRACTION W/PHACO Right 09/21/2018   Procedure: CATARACT EXTRACTION PHACO AND INTRAOCULAR LENS PLACEMENT ([place]);  Surgeon: [name], MD;  Location: [facility] ORS;  Service: Ophthalmology;  Laterality: Right;  [place]  00:20 CDE 00.82 Fluid pack lot # [id number]   CATARACT EXTRACTION W/PHACO Left 10/17/2018   Procedure: CATARACT EXTRACTION PHACO AND INTRAOCULAR LENS PLACEMENT ([place])  LEFT;  Surgeon: [name], MD;  Location: [place];  Service: Ophthalmology;  Laterality: Left;  diabetic - diet controlled   CESAREAN SECTION     CHOLECYSTECTOMY     COLONOSCOPY WITH PROPOFOL N/A 04/05/2017   Procedure: COLONOSCOPY WITH PROPOFOL;  Surgeon: [name], MD;  Location: [facility] ENDOSCOPY;  Service: Endoscopy;  Laterality: N/A;   ESOPHAGOGASTRODUODENOSCOPY (EGD) WITH PROPOFOL N/A 08/28/2016   Procedure: ESOPHAGOGASTRODUODENOSCOPY (EGD) WITH PROPOFOL;  Surgeon: [name], MD;  Location: [facility] ENDOSCOPY;  Service: Endoscopy;  Laterality: N/A;   ESOPHAGOGASTRODUODENOSCOPY (EGD) WITH PROPOFOL N/A 11/08/2020   Procedure: ESOPHAGOGASTRODUODENOSCOPY (EGD) WITH PROPOFOL w// biopsy;  Surgeon: [name], MD;  Location: [place];  Service: Endoscopy;  Laterality: N/A;  Pre-Diabetic   HERNIA REPAIR     INGUINAL HERNIA REPAIR     rt and left   lexiscan cardiolite     TILT TABLE STUDY N/A 04/01/2012   Procedure: TILT TABLE STUDY;  Surgeon: Deboraha Sprang, MD;  Location: [facility] CATH LAB;  Service: Cardiovascular;  Laterality: N/A;    Social History   Tobacco Use   Smoking status: Former    Types: Cigarettes    Start date: 2011    Quit date: 2018    Years since quitting: 5.0   Smokeless tobacco: Never  Vaping Use   Vaping Use: Never used  Substance Use Topics   Alcohol use: No   Drug use: No     Medication  list has been reviewed and updated.  Current Meds  Medication Sig   ALPRAZolam (XANAX XR) 1 MG 24 hr tablet Take 1 mg by mouth daily. Dr. Toy Care   ALPRAZolam Duanne Moron) 1 MG tablet Take 1 mg by mouth at bedtime as needed for anxiety.   amphetamine-dextroamphetamine (ADDERALL XR) 30 MG 24 hr capsule Take 30 mg by mouth daily.   benztropine (COGENTIN) 1 MG tablet Take 1 mg by mouth 2 (two) times daily. Dr. Toy Care   Cholecalciferol (VITAMIN D3) 50 MCG (2000 UT) capsule Take 2,000 Units by mouth daily.   cyclobenzaprine (FLEXERIL) 10 MG tablet Take 10 mg by mouth 3 (three) times daily as needed.   cyclobenzaprine (FLEXERIL) 10 MG tablet Take 1 tablet (10 mg total) by mouth 3 (three) times daily as needed for muscle spasms.   Docusate Sodium (DSS) 100 MG CAPS Take 100 mg by mouth daily.    DULoxetine (CYMBALTA) 60 MG capsule Take 60 mg by mouth  daily. Dr. Toy Care   furosemide (LASIX) 20 MG tablet Take 1 tablet (20 mg total) by mouth daily as needed. Prescribed by PCP   gabapentin (NEURONTIN) 300 MG capsule Take 1 capsule by mouth 3 (three) times daily. 1 in the AM, 2 in the PM   hydrOXYzine (VISTARIL) 25 MG capsule Take 25 mg by mouth 3 (three) times daily as needed.   levothyroxine (SYNTHROID) 100 MCG tablet Take 1 tablet (100 mcg total) by mouth daily before breakfast.   linaclotide (LINZESS) 145 MCG CAPS capsule TAKE ONE CAPSULE EACH MORNING BEFORE BREAKFAST   Melatonin 10 MG TABS Take 10 mg by mouth at bedtime.   Multiple Vitamin (MULTIVITAMIN) capsule Take by mouth.   Omega-3 300 MG CAPS Take 1.2 g by mouth daily.    pantoprazole (PROTONIX) 40 MG tablet Take 1 tablet (40 mg total) by mouth 2 (two) times daily.   primidone (MYSOLINE) 50 MG tablet TAKE 1 TABLET BY MOUTH  TWICE DAILY AT 10 AM AND 5  PM AND 2 TABLETS BY MOUTH  AT BEDTIME   rOPINIRole (REQUIP) 1 MG tablet Take 1.5 mg by mouth at bedtime. Dr. Raul Del   rosuvastatin (CRESTOR) 5 MG tablet Take 1 tablet (5 mg total) by mouth daily.    topiramate (TOPAMAX) 200 MG tablet TAKE ONE TABLET BY MOUTH EVERY MORNING AND TAKE ONE TABLET EVERY EVENING Strength: 200 mg   traZODone (DESYREL) 100 MG tablet Take 150-200 mg by mouth at bedtime. Dr Toy Care   ziprasidone (GEODON) 60 MG capsule Take 120 mg by mouth 2 (two) times daily with a meal. Dr. Toy Care    [facility] 2/9 Scores 10/30/2021 10/21/2021 08/25/2021 07/04/2021  PHQ - 2 Score 5 4 4 5   PHQ- 9 Score 18 19 19 21   Exception Documentation - - - -  Some encounter information is confidential and restricted. Go to Review Flowsheets activity to see all data.    GAD 7 : Generalized Anxiety Score 10/30/2021 10/21/2021 07/04/2021 04/08/2021  Nervous, Anxious, on Edge 3 3 3 3   Control/stop worrying 3 3 3 2   Worry too much - different things 3 3 3 2   Trouble relaxing 2 3 3 2   Restless 2 2 2 2   Easily annoyed or irritable 3 3 2 2   Afraid - awful might happen 3 3 3 2   Total GAD 7 Score 19 20 19 15   Anxiety Difficulty Somewhat difficult Somewhat difficult - Somewhat difficult    BP Readings from Last 3 Encounters:  10/30/21 127/84  10/21/21 112/60  09/17/21 98/68    Physical Exam HENT:     Nose:     Right Sinus: No maxillary sinus tenderness or frontal sinus tenderness.     Left Sinus: No maxillary sinus tenderness or frontal sinus tenderness.     Mouth/Throat:     Mouth: Mucous membranes are dry. No oral lesions.     Tongue: No lesions.     Palate: No lesions.     Pharynx: Uvula midline. No oropharyngeal exudate or posterior oropharyngeal erythema.  Eyes:     Conjunctiva/sclera: Conjunctivae normal.  Neck:     Thyroid: No thyroid mass.  Cardiovascular:     Rate and Rhythm: Normal rate and regular rhythm.  Pulmonary:     Effort: Pulmonary effort is normal.  Musculoskeletal:     Cervical back: Rigidity present. Muscular tenderness present.  Lymphadenopathy:     Cervical: No cervical adenopathy.  Neurological:     Mental Status: She is alert.  Wt Readings from Last 3 Encounters:   10/30/21 154 lb (69.9 kg)  10/21/21 154 lb (69.9 kg)  09/17/21 169 lb 3.2 oz (76.7 kg)    BP 127/84    Pulse 97    Ht $R'5\' 7"'CE$  (1.702 m)    Wt 154 lb (69.9 kg)    SpO2 96%    BMI 24.12 kg/m   Assessment and Plan: 1. Hoarseness of voice She has dry mouth and will continue to use Biotene and consume fluids regularly. Continue daily PPI for possible LPR Unable to visualize her vocal cords but her sx seem consistent with vocal cord inflammation. Try antihistamine daily for several weeks.  If no improvement, will need to see ENT.   Partially dictated using Editor, commissioning. Any errors are unintentional.  Halina Maidens, MD [facility]  10/30/2021

## 2021-10-30 NOTE — Telephone Encounter (Signed)
Left message to advise pt to keep appointment and she can schedule her mammo

## 2021-10-30 NOTE — Telephone Encounter (Signed)
Let her know, her last Annual exam was 2020, and that we focus on some areas that were no addressed at the visit in August 2022.  I would plan to keep appointment.  I will go ahead and place order for MMG now.  She can get that scheduled at her convenience.

## 2021-10-30 NOTE — Telephone Encounter (Signed)
Order had expired. Called pt husband and told him I placed a new order and they can now call to schedule this.

## 2021-10-30 NOTE — Addendum Note (Signed)
Addended by: Mariel Sleet on: 10/30/2021 09:22 AM   Modules accepted: Orders

## 2021-10-30 NOTE — Patient Instructions (Signed)
Loratidine 10 mg (claritin)  Or   Fexofenadine 180 mg (allegra)  Or  Cetirizine 10 mg  (Zyrtec)

## 2021-10-30 NOTE — Telephone Encounter (Signed)
Copied from CRM [id number]. Topic: General - Inquiry >> Oct 30, 2021  8:58 AM Elliot Gault wrote: Reason for CRM: patient states she reached out to the # PCP gave her regarding scheduling her Bone Density appt. Imaging states no orders have been received. Patient would like nurse to call imaging and schedule her appointment and then contact her when completed.

## 2021-10-30 NOTE — Telephone Encounter (Signed)
Pt called in and was wondering if she actually needs her annual that is scheduled in February.  She was thinking that it was taken care of during the 06/03/21 visit with you.  She would also like to have her mammogram done at [facility] if that is possible.  She is already seeing someone there.

## 2021-11-03 ENCOUNTER — Telehealth (INDEPENDENT_AMBULATORY_CARE_PROVIDER_SITE_OTHER): Payer: Medicare Other | Admitting: Pulmonary Disease

## 2021-11-03 ENCOUNTER — Other Ambulatory Visit: Payer: Self-pay

## 2021-11-03 DIAGNOSIS — D869 Sarcoidosis, unspecified: Secondary | ICD-10-CM

## 2021-11-03 DIAGNOSIS — R0602 Shortness of breath: Secondary | ICD-10-CM

## 2021-11-03 NOTE — Progress Notes (Signed)
Virtual Visit via Video Note  I connected with Jennifer Bright on 11/03/21 at  2:45 PM EST by a video enabled telemedicine application and verified that I am speaking with the correct person using two identifiers.  Location: Patient: Home Provider: [facility]   I discussed the limitations of evaluation and management by telemedicine and the availability of in person appointments. The patient expressed understanding and agreed to proceed.   I discussed the assessment and treatment plan with the patient. The patient was provided an opportunity to ask questions and all were answered. The patient agreed with the plan and demonstrated an understanding of the instructions.   The patient was advised to call back or seek an in-person evaluation if the symptoms worsen or if the condition fails to improve as anticipated.  I provided of non-face-to-face time during this encounter.   Subjective:   PATIENT ID: Jennifer Bright GENDER: female DOB: 11-Sep-1966, MRN: [id number]   HPI  Chief Complaint  Patient presents with   Follow-up    Sarcoid    Reason for Visit: Follow-up for sarcoidosis  Jennifer Bright is a 56 year old female with history of sarcoidosis who presents for follow-up. Husband present.  Synopsis: She was diagnosed with sarcoidosis in 2015. She had initially presented to the [facility] ED for kidney stones and chest imaging concerning for lung nodules. She underwent bronchoscopy and which confirmed findings consistent with sarcoid. She was previously seen in [place] in Pulmonary clinic with Dr. Meredeth Ide. Her last PFT was two years ago and CXR 1 year ago. She had bilateral cataract surgery in 2019 and last eye exam in Oct/Nov 2021.   07/18/21 While undergoing PFTs, she had a syncopal episode. EKG with unchanged incomplete RBBB. EMS was called to the office. She declined EMS as she is currently being worked up at [facility]. She wished to be seen  in clinic for her sarcoid follow-up.  She reports that her chronic medications lead her to have suicidal ideation and seeing a psychiatrist, last seen in the two weeks. Prior to this visit, she has been working on being more active. She is walking one mile daily at a steady/brisk pace. Denies shortness of breath, cough or wheezing. Only requires 1-2 breaks for a few minutes which is better compared to six weeks ago. She does have shortness of breath and chest pain which is triggered by stress and anxiety. Does require lasix for lower extremity edema  09/17/21 She is present with her husband. She reports her shortness of breath has worsened in the last few weeks. She has been recently stressed out with family (mom hospitalized) and this will trigger her dyspnea. She is active by walking daily but continues to have fatigue and sleepiness. Sleep test in 06/2021 was negative. Still has non-restorative sleep. Previously on psych meds however self-discontinued. Currently not seeing psychiatry. She reports hearing voices will keep her awake. Denies fevers, chills, chest pain/discomfort.  11/03/21 Husband is present to provide additional history on video call. She reports she is still having neck pain and having associated depression. Planning for injections for neck pain. No difference of her respiratory symptoms on the steroids prescribed on last visit. She has gained 4 lbs since our last visit 6.5 weeks ago when she took short taper of steroids. Denies chest pain, wheezing or cough.  Social History: Quit in 2018. Smoked 1 ppd x 4 years.  Environmental exposures:  Retail  Past Medical History:  Diagnosis Date  Acid reflux    Anemia    Anxiety    Bipolar 1 disorder, manic, moderate ([place]) 09/19/2019   Bipolar depression ([place]) 05/12/2015   Bipolar disorder ([place])    Bipolar disorder ([place]) 05/12/2015   Bleach ingestion 06/27/2020   Cataract    Chronic kidney disease    STAGE 3   Complication of  anesthesia    Felt cataract procedures   Depression    Diabetes mellitus    NIDD   Essential tremor    Family history of adverse reaction to anesthesia    Mother - slow to wake   Family history of breast cancer    doesn't meet medicare guidelines for cancer genetic testing.    Family history of ovarian cancer    Pt doesn't meet Medicare genetic testing guidelines   Hyperlipidemia    Hypothyroid    IBS (irritable bowel syndrome)    Injury by caustic substances, except poisoning, undetermined whether accidentally or purposely inflicted    Migraine headache    vestibular migraine   Obesity    Osteopenia    Restless leg syndrome    Sarcoidosis    Self-inflicted laceration of left wrist ([place]) 09/19/2019   Sleep apnea    Syncope \     Allergies  Allergen Reactions   Cleocin [Clindamycin Hcl] Other (See Comments)    GI distress     Outpatient Medications Prior to Visit  Medication Sig Dispense Refill   ALPRAZolam (XANAX XR) 1 MG 24 hr tablet Take 1 mg by mouth daily. Dr. Toy Care     ALPRAZolam Duanne Moron) 1 MG tablet Take 1 mg by mouth at bedtime as needed for anxiety.     amphetamine-dextroamphetamine (ADDERALL XR) 30 MG 24 hr capsule Take 30 mg by mouth daily.     benztropine (COGENTIN) 1 MG tablet Take 1 mg by mouth 2 (two) times daily. Dr. Toy Care     Cholecalciferol (VITAMIN D3) 50 MCG (2000 UT) capsule Take 2,000 Units by mouth daily.     cyclobenzaprine (FLEXERIL) 10 MG tablet Take 10 mg by mouth 3 (three) times daily as needed.     cyclobenzaprine (FLEXERIL) 10 MG tablet Take 1 tablet (10 mg total) by mouth 3 (three) times daily as needed for muscle spasms. 30 tablet 1   Docusate Sodium (DSS) 100 MG CAPS Take 100 mg by mouth daily.      DULoxetine (CYMBALTA) 60 MG capsule Take 60 mg by mouth daily. Dr. Toy Care     furosemide (LASIX) 20 MG tablet Take 1 tablet (20 mg total) by mouth daily as needed. Prescribed by PCP 30 tablet 5   gabapentin (NEURONTIN) 300 MG capsule Take 1 capsule by  mouth 3 (three) times daily. 1 in the AM, 2 in the PM     hydrOXYzine (VISTARIL) 25 MG capsule Take 25 mg by mouth 3 (three) times daily as needed.     levothyroxine (SYNTHROID) 100 MCG tablet Take 1 tablet (100 mcg total) by mouth daily before breakfast. 90 tablet 1   linaclotide (LINZESS) 145 MCG CAPS capsule TAKE ONE CAPSULE EACH MORNING BEFORE BREAKFAST 90 capsule 1   Melatonin 10 MG TABS Take 10 mg by mouth at bedtime.     Multiple Vitamin (MULTIVITAMIN) capsule Take by mouth.     Omega-3 300 MG CAPS Take 1.2 g by mouth daily.      pantoprazole (PROTONIX) 40 MG tablet Take 1 tablet (40 mg total) by mouth 2 (two) times daily. 60 tablet 5  primidone (MYSOLINE) 50 MG tablet TAKE 1 TABLET BY MOUTH  TWICE DAILY AT 10 AM AND 5  PM AND 2 TABLETS BY MOUTH  AT BEDTIME 360 tablet 0   rOPINIRole (REQUIP) 1 MG tablet Take 1.5 mg by mouth at bedtime. Dr. Raul Del     rosuvastatin (CRESTOR) 5 MG tablet Take 1 tablet (5 mg total) by mouth daily. 90 tablet 0   topiramate (TOPAMAX) 200 MG tablet TAKE ONE TABLET BY MOUTH EVERY MORNING AND TAKE ONE TABLET EVERY EVENING Strength: 200 mg 60 tablet 5   traZODone (DESYREL) 100 MG tablet Take 150-200 mg by mouth at bedtime. Dr Toy Care     ziprasidone (GEODON) 60 MG capsule Take 120 mg by mouth 2 (two) times daily with a meal. Dr. Toy Care     No facility-administered medications prior to visit.    Review of Systems  Constitutional:  Negative for chills, diaphoresis, fever, malaise/fatigue and weight loss.  HENT:  Negative for congestion.   Respiratory:  Positive for shortness of breath. Negative for cough, hemoptysis, sputum production and wheezing.   Cardiovascular:  Negative for chest pain, palpitations and leg swelling.    Objective:   There were no vitals filed for this visit.    Physical Exam: General: Well-appearing, no acute distress HENT: [place], AT Eyes: EOMI, no scleral icterus Respiratory: No respiratory distress, able to complete full  sentences Neuro: AAO x4, CNII-XII grossly intact Psych: Normal mood, normal affect  Data Reviewed:  Imaging: - CT Chest 05/16/14 - Mediastinal and hilar lymphadenopathy and bilateral airspace opacities associated with patchy GGO - CT Chest 03/26/17 - Stable mediastinal and hilar lymphadenopathy and bilateral airspace opacities consistent with known sarcoidosis - CT Chest 07/16/16 - Progression of pulmonary sarcoid with bilateral nodularity with upper lobe predominance - CT Chest 03/26/17 - Improved of pulmonary sarcoid even compared to CT Chest in 2015 with residual scarring in upper lobes. Adenopathy decreased. - CT Chest 08/08/21 - Stable pulmonary sarcoid with residual scarring in upper labs. No progression - MR Cardiac 10/28/21 - No evidence of cardiac sarcoidosis. EF 56%  PFT: 07/18/21 FVC 3.22 (93%) FEV1 2.73 (100%) Ratio 85  TLC 73% DLCO 100% Interpretation: Mild restrictive defect. Normal diffusion  Labs: CBC Latest Ref Rng & Units 10/21/2021 05/27/2021 04/08/2021  WBC 3.4 - 10.8 x10E3/uL 4.9 5.2 5.6  Hemoglobin 11.1 - 15.9 g/dL 13.5 13.9 13.6  Hematocrit 34.0 - 46.6 % 39.1 41.7 41.0  Platelets 150 - 450 x10E3/uL 200 209.0 202   CMP Latest Ref Rng & Units 10/21/2021 10/21/2021 05/27/2021  Glucose 70 - 99 mg/dL 125(H) 126(H) -  BUN 6 - 24 mg/dL 19 20 -  Creatinine 0.57 - 1.00 mg/dL 1.21(H) 1.09(H) -  Sodium 134 - 144 mmol/L 143 140 -  Potassium 3.5 - 5.2 mmol/L 4.3 4.1 -  Chloride 96 - 106 mmol/L 109(H) 108(H) -  CO2 20 - 29 mmol/L 19(L) 20 -  Calcium 8.7 - 10.2 mg/dL 9.9 9.8 9.6  Total Protein 6.0 - 8.5 g/dL 6.4 - -  Total Bilirubin 0.0 - 1.2 mg/dL 0.3 - -  Alkaline Phos 44 - 121 IU/L 61 - -  AST 0 - 40 IU/L 16 - -  ALT 0 - 32 IU/L 20 - -  ACE 10/21/21 - 65   EKG 07/18/21 Incomplete RBBB. No evidence ST elevations or TWI  Echocardiogram 08/22/21 - EF 60-65%. Grade I DD. No WMA. No valvular abnormalities  Sleep Test  06/30/21 AHI 1/hr     Assessment &  Plan:   Discussion: 56 year  old female with pulmonary sarcoid who presents for follow-up. We reviewed cardiac MRI (neg for sarcoid) and CT chest (scarring related to sarcoid). No relief with steroid taper.   Pulmonary sarcoidosis with restrictive lung defect - Dx in 2015 via endobronchial bx - Previously on prednisone for 1 year in 2015. No response to taper - Annual PFTs. Next due 06/2022 - Recommend annual ophthalmology exam.  Last visit on 07/2020. Scheduled Feb at [facility] - EKG reviewed. Unchanged RBBB - Reviewed CBC, CMET, ACE. Stage IIIa CKD. Following Nephrology with Dr. Juleen China in [place]. [facility] [phone]  Shortness of breath Chest pain Syncope of unknown cause - EKG reviewed with no acute findings. No ST-elevation or TWI - Echocardiogram with Grade I DD - Follow-up up with Cardiology, Dr. Haroldine Laws. Consider conduction abnormalities in setting syncope of unknown cause - ORDER PET/CT (sarcoid protocol) in March 2023  Health Maintenance Immunization History  Administered Date(s) Administered   Influenza,inj,Quad PF,6+ Mos 07/09/2018, 08/09/2019, 08/14/2020, 07/30/2021   Influenza-Unspecified 08/13/2017, 07/09/2018, 08/09/2019   Moderna Sars-Covid-2 Vaccination 03/06/2020, 03/31/2020   Tdap 08/07/2011, 09/18/2019   Zoster Recombinat (Shingrix) 04/18/2021, 07/04/2021   CT Lung Screen - not qualified. Insufficient smoking history  Orders Placed This Encounter  Procedures   CT Chest Wo Contrast    Standing Status:   Future    Standing Expiration Date:   11/03/2022    Order Specific Question:   Is patient pregnant?    Answer:   No    Order Specific Question:   Preferred imaging location?    Answer:   [facility]   NM PET Image Initial (PI) Skull Base To Thigh    Standing Status:   Future    Standing Expiration Date:   11/03/2022    Order Specific Question:   If indicated for the ordered procedure, I authorize the administration of a radiopharmaceutical per Radiology  protocol    Answer:   Yes    Order Specific Question:   Is the patient pregnant?    Answer:   No    Order Specific Question:   Preferred imaging location?    Answer:   [facility]   NM PET Image Initial (PI) Skull Base To Thigh    Standing Status:   Future    Standing Expiration Date:   11/04/2022    Scheduling Instructions:     [facility]     Sarcoid Protocol - Myocardial Viability Study    Order Specific Question:   If indicated for the ordered procedure, I authorize the administration of a radiopharmaceutical per Radiology protocol    Answer:   Yes    Order Specific Question:   Is the patient pregnant?    Answer:   No    Order Specific Question:   Preferred imaging location?    Answer:   External   CT Chest Wo Contrast    Standing Status:   Future    Standing Expiration Date:   11/04/2022    Scheduling Instructions:     [facility]     Sarcoid Protocol - Myocardial Viability Study    Order Specific Question:   Is patient pregnant?    Answer:   No    Order Specific Question:   Preferred imaging location?    Answer:   External   No orders of the defined types were placed in this encounter.  Return in about 3 months (around 02/01/2022).  I have spent a total time of 36-minutes on the day of the appointment reviewing prior documentation, coordinating care and discussing medical diagnosis and plan with the patient/family. Past medical history, allergies, medications were reviewed. Pertinent imaging, labs and tests included in this note have been reviewed and interpreted independently by me.  Engel[place], MD [facility] 11/03/2021 2:53 PM  Office Number [phone]

## 2021-11-03 NOTE — Patient Instructions (Addendum)
Pulmonary sarcoidosis with restrictive lung defect Shortness of breath Chest pain Syncope of unknown cause - No response to steroid taper - Our office will FAX labs to your Nephrologist - Follow-up up with Cardiology, Dr. Gala Romney. Consider conduction abnormalities in setting syncope of unknown cause - ORDER PET/CT (sarcoid protocol) in March 2023  Follow-up with me after PET/CT in March 2023

## 2021-11-04 ENCOUNTER — Telehealth: Payer: Self-pay

## 2021-11-04 NOTE — Telephone Encounter (Signed)
I was contacting patient for rescheduling on 12/01/21 with you for annual exam. You are in a meeting that morning. Patient states she was seen in 8/22 for annual and wan't sure why she was scheduled for 12/01/21. Could you please advise if this patient is due for annual exam. Thank you!

## 2021-11-05 NOTE — Telephone Encounter (Signed)
She was seen in Aug for a visit related to symptoms, not an Annual Preventative Care Visit (breast and pelvic exams, labs, etc).  Sch if she desires to be seen.

## 2021-11-05 NOTE — Telephone Encounter (Signed)
Contacted patient via phone. Patient is going to do some research on scheduling with another provider.

## 2021-11-07 ENCOUNTER — Other Ambulatory Visit: Payer: Self-pay

## 2021-11-07 ENCOUNTER — Ambulatory Visit
Admission: RE | Admit: 2021-11-07 | Discharge: 2021-11-07 | Disposition: A | Discharge status: Home / Self Care | Payer: Medicare Other | Source: Ambulatory Visit | Attending: Family Medicine | Admitting: Family Medicine

## 2021-11-07 DIAGNOSIS — M47812 Spondylosis without myelopathy or radiculopathy, cervical region: Secondary | ICD-10-CM | POA: Diagnosis not present

## 2021-11-07 DIAGNOSIS — M2550 Pain in unspecified joint: Secondary | ICD-10-CM

## 2021-11-07 DIAGNOSIS — M542 Cervicalgia: Secondary | ICD-10-CM

## 2021-11-07 MED ORDER — TRIAMCINOLONE ACETONIDE 40 MG/ML IJ SUSP (RADIOLOGY)
60.0000 mg | Freq: Once | INTRAMUSCULAR | Status: AC
  Administered 2021-11-07: 60 mg via EPIDURAL

## 2021-11-07 MED ORDER — IOPAMIDOL (ISOVUE-M 300) INJECTION 61%
1.0000 mL | Freq: Once | INTRAMUSCULAR | Status: AC
  Administered 2021-11-07: 1 mL via EPIDURAL

## 2021-11-07 NOTE — Discharge Instructions (Signed)

## 2021-11-09 ENCOUNTER — Encounter: Payer: Self-pay | Admitting: Pulmonary Disease

## 2021-11-10 ENCOUNTER — Telehealth: Payer: Self-pay | Admitting: Pulmonary Disease

## 2021-11-10 NOTE — Progress Notes (Signed)
Patient is scheduled for 4pm on Mon. 01/19/22

## 2021-11-10 NOTE — Telephone Encounter (Signed)
Pt aware we cancelled it here due to it supposed to be at [facility]

## 2021-11-12 ENCOUNTER — Other Ambulatory Visit (HOSPITAL_COMMUNITY): Payer: Self-pay

## 2021-11-12 ENCOUNTER — Other Ambulatory Visit: Payer: Self-pay

## 2021-11-12 ENCOUNTER — Encounter (HOSPITAL_COMMUNITY): Payer: Self-pay

## 2021-11-12 ENCOUNTER — Ambulatory Visit (HOSPITAL_COMMUNITY)
Admission: RE | Admit: 2021-11-12 | Discharge: 2021-11-12 | Disposition: A | Discharge status: Home / Self Care | Payer: Medicare Other | Source: Ambulatory Visit | Attending: Internal Medicine | Admitting: Internal Medicine

## 2021-11-12 ENCOUNTER — Other Ambulatory Visit: Payer: Self-pay | Admitting: Internal Medicine

## 2021-11-12 ENCOUNTER — Encounter (HOSPITAL_COMMUNITY): Payer: Self-pay | Admitting: Internal Medicine

## 2021-11-12 VITALS — BP 116/70 | HR 97 | Wt 179.6 lb

## 2021-11-12 DIAGNOSIS — R0789 Other chest pain: Secondary | ICD-10-CM

## 2021-11-12 DIAGNOSIS — R55 Syncope and collapse: Secondary | ICD-10-CM | POA: Diagnosis not present

## 2021-11-12 DIAGNOSIS — R0609 Other forms of dyspnea: Secondary | ICD-10-CM

## 2021-11-12 DIAGNOSIS — D869 Sarcoidosis, unspecified: Secondary | ICD-10-CM | POA: Diagnosis not present

## 2021-11-12 LAB — BASIC METABOLIC PANEL
Anion gap: 5 (ref 5–15)
BUN: 17 mg/dL (ref 6–20)
CO2: 19 mmol/L — ABNORMAL LOW (ref 22–32)
Calcium: 8.6 mg/dL — ABNORMAL LOW (ref 8.9–10.3)
Chloride: 111 mmol/L (ref 98–111)
Creatinine, Ser: 1.07 mg/dL — ABNORMAL HIGH (ref 0.44–1.00)
GFR, Estimated: >60 mL/min (ref >60)
Glucose, Bld: 202 mg/dL — ABNORMAL HIGH (ref 70–99)
Potassium: 3.6 mmol/L (ref 3.5–5.1)
Sodium: 135 mmol/L (ref 135–145)

## 2021-11-12 LAB — CBC
HCT: 41 % (ref 36.0–46.0)
Hemoglobin: 13.5 g/dL (ref 12.0–15.0)
MCH: 29.7 pg (ref 26.0–34.0)
MCHC: 32.9 g/dL (ref 30.0–36.0)
MCV: 90.1 fL (ref 80.0–100.0)
Platelets: 235 10*3/uL (ref 150–400)
RBC: 4.55 MIL/uL (ref 3.87–5.11)
RDW: 12.7 % (ref 11.5–15.5)
WBC: 6.9 10*3/uL (ref 4.0–10.5)
nRBC: 0 % (ref 0.0–0.2)

## 2021-11-12 NOTE — H&P (View-Only) (Signed)
Advanced Heart Failure Clinic Note   Referring Physician: Dr. Loanne Drilling PCP: Glean Hess, MD PCP-Cardiologist: None   HPI:  56 y.o. female with history of pulmonary sarcoidosis, Bipolar disorder, PTSD/childhood trauma, hx suicide attempts, stage III CKD, HDL, hypothyroidism.    Pulmonary sarcoid diagnosed via bronchoscopy in 2015. Previously on prednisone in 2015. She is followed by Dr. Loanne Drilling with pulmonary. She was last seen 11/03/2021 and has been referred to rule out cardiac sarcoid. PFTs in September 2022 with mild restrictive defect. She had syncopal episode during the exam. She declined EMS. Etiology not certain. Echo 11/22 with EF 60-65%, G1DD, RV okay, no significant valvular heart disease. cMRI 01/23 with LVEF 56%, RV EF 54%, no myocardial LGE. No evidence of cardiac sarcoidosis. PET CT has been ordered.  ECG: SR 97 bpm  Reports episodes of syncope intermittently the last 12-13 years. Episodes can occur while sitting or standing or using the bathroom. Stress can be trigger. May feel dizzy prior to passing out but not always. Has previously passed out when turning head to fast or when car turns in on-ramp too quickly. Previously seen by Dr. Caryl Comes in 2013. Had negative tilt table study. Wore a 30 day monitor back in 2013. No arrhythmias detected. 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 Has followed with Neurology. Prior EEG unremarkable. Reports ENT workup previously negative.   Notes some chronic dyspnea with exertion but more bothersome is intermittent substernal chest pain. Occurs randomly without relationship to activity. It is stabbing and may last 5 minutes to an hour in duration. Not worse with deep inspiration.   Review of Systems: [y] = yes, [ ]  = no   General: Weight gain [ ] ; Weight loss [ ] ; Anorexia [ ] ; Fatigue [ ] ; Fever [ ] ; Chills [ ] ; Weakness [ ]   Cardiac: Chest pain/pressure [Y]; Resting SOB [ ] ; Exertional SOB [Y]; Orthopnea [ ] ; Pedal Edema [ ] ;  Palpitations [ ] ; Syncope [ ] ; Presyncope [ ] ; Paroxysmal nocturnal dyspnea[ ]   Pulmonary: Cough [ ] ; Wheezing[ ] ; Hemoptysis[ ] ; Sputum [ ] ; Snoring [ ]   GI: Vomiting[ ] ; Dysphagia[ ] ; Melena[ ] ; Hematochezia [ ] ; Heartburn[ ] ; Abdominal pain [ ] ; Constipation [ ] ; Diarrhea [ ] ; BRBPR [ ]   GU: Hematuria[ ] ; Dysuria [ ] ; Nocturia[ ]   Vascular: Pain in legs with walking [ ] ; Pain in feet with lying flat [ ] ; Non-healing sores [ ] ; Stroke [ ] ; TIA [ ] ; Slurred speech [ ] ;  Neuro: Headaches[Y]; Vertigo[ ] ; Seizures[ ] ; Paresthesias[ ] ;Blurred vision [ ] ; Diplopia [ ] ; Vision changes [ ]   Ortho/Skin: Arthritis [ ] ; Joint pain [ ] ; Muscle pain [ ] ; Joint swelling [ ] ; Back Pain [ ] ; Rash [ ]   Psych: Depression[Y]; Anxiety[Y]  Heme: Bleeding problems [ ] ; Clotting disorders [ ] ; Anemia [ ]   Endocrine: Diabetes [ ] ; Thyroid dysfunction[Y]   Past Medical History:  Diagnosis Date   Acid reflux    Anemia    Anxiety    Bipolar 1 disorder, manic, moderate ([place]) 09/19/2019   Bipolar depression ([place]) 05/12/2015   Bipolar disorder ([place])    Bipolar disorder ([place]) 05/12/2015   Bleach ingestion 06/27/2020   Cataract    Chronic kidney disease    STAGE 3   Complication of anesthesia    Felt cataract procedures   Depression    Diabetes mellitus    NIDD   Essential tremor    Family history of adverse reaction to anesthesia    Mother -  slow to wake   Family history of breast cancer    doesn't meet medicare guidelines for cancer genetic testing.    Family history of ovarian cancer    Pt doesn't meet Medicare genetic testing guidelines   Hyperlipidemia    Hypothyroid    IBS (irritable bowel syndrome)    Injury by caustic substances, except poisoning, undetermined whether accidentally or purposely inflicted    Migraine headache    vestibular migraine   Obesity    Osteopenia    Restless leg syndrome    Sarcoidosis    Self-inflicted laceration of left wrist ([facility]) 09/19/2019   Sleep apnea    Syncope \     Current Outpatient Medications  Medication Sig Dispense Refill   ALPRAZolam (XANAX XR) 1 MG 24 hr tablet Take 1 mg by mouth daily. Dr. Evelene CroonKaur     ALPRAZolam Prudy Feeler(XANAX) 1 MG tablet Take 1 mg by mouth at bedtime as needed for anxiety.     amphetamine-dextroamphetamine (ADDERALL XR) 30 MG 24 hr capsule Take 30 mg by mouth daily.     benztropine (COGENTIN) 1 MG tablet Take 1 mg by mouth 2 (two) times daily. Dr. Evelene CroonKaur     Cholecalciferol (VITAMIN D3) 50 MCG (2000 UT) capsule Take 2,000 Units by mouth daily.     cyclobenzaprine (FLEXERIL) 10 MG tablet Take 1 tablet (10 mg total) by mouth 3 (three) times daily as needed for muscle spasms. 30 tablet 1   docusate sodium (COLACE) 100 MG capsule Take 100 mg by mouth 2 (two) times daily.     DULoxetine (CYMBALTA) 60 MG capsule Take 60 mg by mouth daily. Dr. Evelene CroonKaur     furosemide (LASIX) 20 MG tablet Take 1 tablet (20 mg total) by mouth daily as needed. Prescribed by PCP 30 tablet 5   gabapentin (NEURONTIN) 300 MG capsule Take 1 capsule by mouth 3 (three) times daily. 1 in the AM, 2 in the PM     hydrOXYzine (VISTARIL) 25 MG capsule Take 25 mg by mouth 3 (three) times daily as needed.     levothyroxine (SYNTHROID) 100 MCG tablet Take 1 tablet (100 mcg total) by mouth daily before breakfast. 90 tablet 1   Melatonin 10 MG TABS Take 10 mg by mouth at bedtime.     Multiple Vitamin (MULTIVITAMIN) capsule Take by mouth.     omega-3 acid ethyl esters (LOVAZA) 1 g capsule Take 1 g by mouth daily.     pantoprazole (PROTONIX) 40 MG tablet Take 1 tablet (40 mg total) by mouth 2 (two) times daily. 60 tablet 5   primidone (MYSOLINE) 50 MG tablet TAKE 1 TABLET BY MOUTH  TWICE DAILY AT 10 AM AND 5  PM AND 2 TABLETS BY MOUTH  AT BEDTIME 360 tablet 0   rosuvastatin (CRESTOR) 5 MG tablet Take 1 tablet (5 mg total) by mouth daily. 90 tablet 0   topiramate (TOPAMAX) 200 MG tablet TAKE ONE TABLET BY MOUTH EVERY MORNING AND TAKE ONE TABLET EVERY EVENING Strength: 200 mg 60 tablet 5    trazodone (DESYREL) 300 MG tablet Take 300 mg by mouth at bedtime.     No current facility-administered medications for this encounter.    Allergies  Allergen Reactions   Cleocin [Clindamycin Hcl] Other (See Comments)    GI distress      Social History   Socioeconomic History   Marital status: Married    Spouse name: Not on file   Number of children: Not on file  Years of education: Not on file   Highest education level: Not on file  Occupational History   Occupation: disabled  Tobacco Use   Smoking status: Former    Types: Cigarettes    Start date: 2011    Quit date: 2018    Years since quitting: 5.0   Smokeless tobacco: Never  Vaping Use   Vaping Use: Never used  Substance and Sexual Activity   Alcohol use: No   Drug use: No   Sexual activity: Yes  Other Topics Concern   Not on file  Social History Narrative   Not on file   Social Determinants of Health   Financial Resource Strain: Medium Risk   Difficulty of Paying Living Expenses: Somewhat hard  Food Insecurity: No Food Insecurity   Worried About Charity fundraiser in the Last Year: Never true   Ran Out of Food in the Last Year: Never true  Transportation Needs: No Transportation Needs   Lack of Transportation (Medical): No   Lack of Transportation (Non-Medical): No  Physical Activity: Insufficiently Active   Days of Exercise per Week: 7 days   Minutes of Exercise per Session: 20 min  Stress: Stress Concern Present   Feeling of Stress : Very much  Social Connections: Moderately Isolated   Frequency of Communication with Friends and Family: Never   Frequency of Social Gatherings with Friends and Family: Once a week   Attends Religious Services: More than 4 times per year   Active Member of [employer] or Organizations: No   Attends Archivist Meetings: Never   Marital Status: Married  Human resources officer Violence: Not At Risk   Fear of Current or Ex-Partner: No   Emotionally Abused: No    Physically Abused: No   Sexually Abused: No      Family History  Problem Relation Age of Onset   Hypertension Mother    Hyperlipidemia Mother    Stroke Mother    Lung cancer Mother    Barrett's esophagus Mother    Colon cancer Father    Stomach cancer Father    Irritable bowel syndrome Sister    Alcohol abuse Brother    Cirrhosis Brother    Breast cancer Paternal Aunt    Ovarian cancer Paternal Aunt    Cancer Cousin    Cancer Cousin    Ovarian cancer Cousin    Kidney cancer Neg Hx    Kidney disease Neg Hx    Prostate cancer Neg Hx     Vitals:   11/12/21 1459  BP: 116/70  Pulse: 97  SpO2: 97%  Weight: 81.5 kg (179 lb 9.6 oz)     PHYSICAL EXAM: General:  Well appearing. No respiratory difficulty. Sitting in wheelchair. HEENT: normal Neck: supple. no JVD. Carotids 2+ bilat; no bruits. No lymphadenopathy or thyromegaly appreciated. Cor: PMI nondisplaced. Regular rate & rhythm. No rubs, gallops or murmurs. Lungs: clear Abdomen: soft, nontender, nondistended. No hepatosplenomegaly. No bruits or masses. Good bowel sounds. Extremities: no cyanosis, clubbing, rash, edema Neuro: alert & oriented x 3, cranial nerves grossly intact. moves all 4 extremities w/o difficulty. Affect pleasant.  ECG: SR 97 bpm   ASSESSMENT & PLAN: Syncope: -Intermittent episodes for > 12 years. -She's had extensive workup. Recent cMRI with no evidence of cardiac sarcoidosis.  -Previously saw EP. Tilt table study negative. No arrhythmias on prior event monitor -Has followed with Neuro for vestibular migraines and had negative ENT workup. -Suspect vasovagal. Conservative management recommended  2. Dyspnea with  exertion/Chest pain: -Echo 11/22: EF 60-65%, G1 DD, RV okay, inadequate TR signal to assess PA pressure -cMRI 01/23: EF 56%, no myocardial LGE, no evidence of cardiac sarcoidosis -Symptoms atypical for angina. Does not appear volume overloaded. However, symptoms very troublesome for her  and has some risk factors including HLD and prior tobacco. -Did not tolerate last stress test well  -Will arrange for [facility] for definitive assessment of coronaries and assess R sided pressures given known hx of pulmonary sarcoidosis  3. HLD: -LDL markedly elevated at 238 on last set of labs 10/21/21 -PCP started rosuvastatin 01/04 and following repeated lipid panel in 3 months -Potential side of Geodon which she was taking up until recently.  -If LDL remains significantly elevated, consider referral to lipid clinic for PCSK-9 inhibitor  4. Pulmonary sarcoid: - Mild restrictive defect on PFT 09/22 - Followed by pulmonary   Follow-up: Pending results of [facility]   [facility], [place], PA-C 11/12/21   Patient seen and examined with the above-signed Advanced Practice Provider and/or Housestaff. I personally reviewed laboratory data, imaging studies and relevant notes. I independently examined the patient and formulated the important aspects of the plan. I have edited the note to reflect any of my changes or salient points. I have personally discussed the plan with the patient and/or family.  56 y/o woman as above with recurrent syncope, progressive CP and marked exertional dyspnea.   General:  Well appearing. No resp difficulty HEENT: normal Neck: supple. no JVD. Carotids 2+ bilat; no bruits. No lymphadenopathy or thryomegaly appreciated. Cor: PMI nondisplaced. Regular rate & rhythm. No rubs, gallops or murmurs. Lungs: clear Abdomen: soft, nontender, nondistended. No hepatosplenomegaly. No bruits or masses. Good bowel sounds. Extremities: no cyanosis, clubbing, rash, edema Neuro: alert & orientedx3, cranial nerves grossly intact. moves all 4 extremities w/o difficulty. Affect pleasant  Suspect syncope is vagovagal in nature. However she also complains of progressive CP and severe exertional dyspnea. We discussed options for further evaluation and she wants to proceed with R/L cath for  definitive evaluation given severity of her symptoms. I agree with this approach.   Glori Bickers, MD  9:17 AM

## 2021-11-12 NOTE — Telephone Encounter (Signed)
Requested medication (s) are due for refill today:   Yes  Requested medication (s) are on the active medication list:   Yes  Future visit scheduled:   Yes   Last ordered: 10/21/2020 #60, 5 refills by another provider.  Returned because this was prescribed by another provider a year ago.   Requested Prescriptions  Pending Prescriptions Disp Refills   pantoprazole (PROTONIX) 40 MG tablet [Pharmacy Med Name: PANTOPRAZOLE SODIUM 40 MG DR TAB] 30 tablet     Sig: TAKE ONE TABLET EVERY DAY 15-20 MINUTES BEFORE A MEAL     Gastroenterology: Proton Pump Inhibitors Passed - 11/12/2021  8:13 AM      Passed - Valid encounter within last 12 months    Recent Outpatient Visits           1 week ago Hoarseness of voice   [facility] Reubin Milan, MD   3 weeks ago Prediabetes   [facility] Reubin Milan, MD   4 months ago Annual physical exam   [facility] Reubin Milan, MD   7 months ago Hypothyroidism, unspecified type   [facility] Reubin Milan, MD   10 months ago Acute pain of right knee   [facility] Reubin Milan, MD       Future Appointments             In 2 months Luciano Cutter, MD [facility]   In 2 months Judithann Graves Nyoka Cowden, MD [facility]

## 2021-11-12 NOTE — Patient Instructions (Signed)
Medication Changes:  No change  Lab Work:  Labs done today, your results will be available in MyChart, we will contact you for abnormal readings.   Testing/Procedures:  You are scheduled for a Cardiac Catheterization on Wednesday, February 1 with Dr. Glori Bickers.  1. Please arrive at the [facility] (Main Entrance A) at [facility]: [address] [place], [place] [address] at 9:30 AM (This time is two hours before your procedure to ensure your preparation). Free valet parking service is available.   Special note: Every effort is made to have your procedure done on time. Please understand that emergencies sometimes delay scheduled procedures.  2. Diet: Do not eat solid foods after midnight.  The patient may have clear liquids until 5am upon the day of the procedure.  3. Labs: completed 11/12/2021   4. Medication instructions in preparation for your procedure:   Contrast Allergy: No    Stop taking, Furosemide  Wednesday, February 1,  On the morning of your procedure, take your  morning medicines except those listed above.  You may use sips of water.  5. Plan for one night stay--bring personal belongings. 6. Bring a current list of your medications and current insurance cards. 7. You MUST have a responsible person to drive you home. 8. Someone MUST be with you the first 24 hours after you arrive home or your discharge will be delayed. 9. Please wear clothes that are easy to get on and off and wear slip-on shoes.  Referrals:  none  Special Instructions // Education:  none  Follow-Up in: post Cath  At the [facility], you and your health needs are our priority. We have a designated team specialized in the treatment of Heart Failure. This Care Team includes your primary Heart Failure Specialized Cardiologist (physician), Advanced Practice Providers (APPs- Physician Assistants and Nurse Practitioners), and Pharmacist who all work together to  provide you with the care you need, when you need it.   You may see any of the following providers on your designated Care Team at your next follow up:  Dr Glori Bickers Dr Haynes Kerns, NP Lyda Jester, [place] [facility] [place], [place] Audry Riles, PharmD   Please be sure to bring in all your medications bottles to every appointment.   Need to Contact us:  If you have any questions or concerns before your next appointment please send [place] a message through [place] or call our office at [phone].    TO LEAVE A MESSAGE FOR THE NURSE SELECT OPTION 2, PLEASE LEAVE A MESSAGE INCLUDING: YOUR NAME DATE OF BIRTH CALL BACK NUMBER REASON FOR CALL**this is important as we prioritize the call backs  YOU WILL RECEIVE A CALL BACK THE SAME DAY AS LONG AS YOU CALL BEFORE 4:00 PM

## 2021-11-12 NOTE — Progress Notes (Signed)
Advanced Heart Failure Clinic Note   Referring Physician: Dr. Loanne Drilling PCP: Glean Hess, MD PCP-Cardiologist: None   HPI:  56 y.o. female with history of pulmonary sarcoidosis, Bipolar disorder, PTSD/childhood trauma, hx suicide attempts, stage III CKD, HDL, hypothyroidism.    Pulmonary sarcoid diagnosed via bronchoscopy in 2015. Previously on prednisone in 2015. She is followed by Dr. Loanne Drilling with pulmonary. She was last seen 11/03/2021 and has been referred to rule out cardiac sarcoid. PFTs in September 2022 with mild restrictive defect. She had syncopal episode during the exam. She declined EMS. Etiology not certain. Echo 11/22 with EF 60-65%, G1DD, RV okay, no significant valvular heart disease. cMRI 01/23 with LVEF 56%, RV EF 54%, no myocardial LGE. No evidence of cardiac sarcoidosis. PET CT has been ordered.  ECG: SR 97 bpm  Reports episodes of syncope intermittently the last 12-13 years. Episodes can occur while sitting or standing or using the bathroom. Stress can be trigger. May feel dizzy prior to passing out but not always. Has previously passed out when turning head to fast or when car turns in on-ramp too quickly. Previously seen by Dr. Caryl Comes in 2013. Had negative tilt table study. Wore a 30 day monitor back in 2013. No arrhythmias detected. 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 Has followed with Neurology. Prior EEG unremarkable. Reports ENT workup previously negative.   Notes some chronic dyspnea with exertion but more bothersome is intermittent substernal chest pain. Occurs randomly without relationship to activity. It is stabbing and may last 5 minutes to an hour in duration. Not worse with deep inspiration.   Review of Systems: [y] = yes, [ ]  = no   General: Weight gain [ ] ; Weight loss [ ] ; Anorexia [ ] ; Fatigue [ ] ; Fever [ ] ; Chills [ ] ; Weakness [ ]   Cardiac: Chest pain/pressure [Y]; Resting SOB [ ] ; Exertional SOB [Y]; Orthopnea [ ] ; Pedal Edema [ ] ;  Palpitations [ ] ; Syncope [ ] ; Presyncope [ ] ; Paroxysmal nocturnal dyspnea[ ]   Pulmonary: Cough [ ] ; Wheezing[ ] ; Hemoptysis[ ] ; Sputum [ ] ; Snoring [ ]   GI: Vomiting[ ] ; Dysphagia[ ] ; Melena[ ] ; Hematochezia [ ] ; Heartburn[ ] ; Abdominal pain [ ] ; Constipation [ ] ; Diarrhea [ ] ; BRBPR [ ]   GU: Hematuria[ ] ; Dysuria [ ] ; Nocturia[ ]   Vascular: Pain in legs with walking [ ] ; Pain in feet with lying flat [ ] ; Non-healing sores [ ] ; Stroke [ ] ; TIA [ ] ; Slurred speech [ ] ;  Neuro: Headaches[Y]; Vertigo[ ] ; Seizures[ ] ; Paresthesias[ ] ;Blurred vision [ ] ; Diplopia [ ] ; Vision changes [ ]   Ortho/Skin: Arthritis [ ] ; Joint pain [ ] ; Muscle pain [ ] ; Joint swelling [ ] ; Back Pain [ ] ; Rash [ ]   Psych: Depression[Y]; Anxiety[Y]  Heme: Bleeding problems [ ] ; Clotting disorders [ ] ; Anemia [ ]   Endocrine: Diabetes [ ] ; Thyroid dysfunction[Y]   Past Medical History:  Diagnosis Date   Acid reflux    Anemia    Anxiety    Bipolar 1 disorder, manic, moderate ([place]) 09/19/2019   Bipolar depression ([place]) 05/12/2015   Bipolar disorder ([place])    Bipolar disorder ([place]) 05/12/2015   Bleach ingestion 06/27/2020   Cataract    Chronic kidney disease    STAGE 3   Complication of anesthesia    Felt cataract procedures   Depression    Diabetes mellitus    NIDD   Essential tremor    Family history of adverse reaction to anesthesia    Mother -  slow to wake  ° Family history of breast cancer   ° doesn't meet medicare guidelines for cancer genetic testing.   ° Family history of ovarian cancer   ° Pt doesn't meet Medicare genetic testing guidelines  ° Hyperlipidemia   ° Hypothyroid   ° IBS (irritable bowel syndrome)   ° Injury by caustic substances, except poisoning, undetermined whether accidentally or purposely inflicted   ° Migraine headache   ° vestibular migraine  ° Obesity   ° Osteopenia   ° Restless leg syndrome   ° Sarcoidosis   ° Self-inflicted laceration of left wrist ([facility]) 09/19/2019  ° Sleep apnea   ° Syncope \   ° ° °Current Outpatient Medications  °Medication Sig Dispense Refill  ° ALPRAZolam (XANAX XR) 1 MG 24 hr tablet Take 1 mg by mouth daily. Dr. Kaur    ° ALPRAZolam (XANAX) 1 MG tablet Take 1 mg by mouth at bedtime as needed for anxiety.    ° amphetamine-dextroamphetamine (ADDERALL XR) 30 MG 24 hr capsule Take 30 mg by mouth daily.    ° benztropine (COGENTIN) 1 MG tablet Take 1 mg by mouth 2 (two) times daily. Dr. Kaur    ° Cholecalciferol (VITAMIN D3) 50 MCG (2000 UT) capsule Take 2,000 Units by mouth daily.    ° cyclobenzaprine (FLEXERIL) 10 MG tablet Take 1 tablet (10 mg total) by mouth 3 (three) times daily as needed for muscle spasms. 30 tablet 1  ° docusate sodium (COLACE) 100 MG capsule Take 100 mg by mouth 2 (two) times daily.    ° DULoxetine (CYMBALTA) 60 MG capsule Take 60 mg by mouth daily. Dr. Kaur    ° furosemide (LASIX) 20 MG tablet Take 1 tablet (20 mg total) by mouth daily as needed. Prescribed by PCP 30 tablet 5  ° gabapentin (NEURONTIN) 300 MG capsule Take 1 capsule by mouth 3 (three) times daily. 1 in the AM, 2 in the PM    ° hydrOXYzine (VISTARIL) 25 MG capsule Take 25 mg by mouth 3 (three) times daily as needed.    ° levothyroxine (SYNTHROID) 100 MCG tablet Take 1 tablet (100 mcg total) by mouth daily before breakfast. 90 tablet 1  ° Melatonin 10 MG TABS Take 10 mg by mouth at bedtime.    ° Multiple Vitamin (MULTIVITAMIN) capsule Take by mouth.    ° omega-3 acid ethyl esters (LOVAZA) 1 g capsule Take 1 g by mouth daily.    ° pantoprazole (PROTONIX) 40 MG tablet Take 1 tablet (40 mg total) by mouth 2 (two) times daily. 60 tablet 5  ° primidone (MYSOLINE) 50 MG tablet TAKE 1 TABLET BY MOUTH  TWICE DAILY AT 10 AM AND 5  PM AND 2 TABLETS BY MOUTH  AT BEDTIME 360 tablet 0  ° rosuvastatin (CRESTOR) 5 MG tablet Take 1 tablet (5 mg total) by mouth daily. 90 tablet 0  ° topiramate (TOPAMAX) 200 MG tablet TAKE ONE TABLET BY MOUTH EVERY MORNING AND TAKE ONE TABLET EVERY EVENING Strength: 200 mg 60 tablet 5   ° trazodone (DESYREL) 300 MG tablet Take 300 mg by mouth at bedtime.    ° °No current facility-administered medications for this encounter.  ° ° °Allergies  °Allergen Reactions  ° Cleocin [Clindamycin Hcl] Other (See Comments)  °  GI distress  ° ° °  °Social History  ° °Socioeconomic History  ° Marital status: Married  °  Spouse name: Not on file  ° Number of children: Not on file  °   Years of education: Not on file   Highest education level: Not on file  Occupational History   Occupation: disabled  Tobacco Use   Smoking status: Former    Types: Cigarettes    Start date: 2011    Quit date: 2018    Years since quitting: 5.0   Smokeless tobacco: Never  Vaping Use   Vaping Use: Never used  Substance and Sexual Activity   Alcohol use: No   Drug use: No   Sexual activity: Yes  Other Topics Concern   Not on file  Social History Narrative   Not on file   Social Determinants of Health   Financial Resource Strain: Medium Risk   Difficulty of Paying Living Expenses: Somewhat hard  Food Insecurity: No Food Insecurity   Worried About Charity fundraiser in the Last Year: Never true   Ran Out of Food in the Last Year: Never true  Transportation Needs: No Transportation Needs   Lack of Transportation (Medical): No   Lack of Transportation (Non-Medical): No  Physical Activity: Insufficiently Active   Days of Exercise per Week: 7 days   Minutes of Exercise per Session: 20 min  Stress: Stress Concern Present   Feeling of Stress : Very much  Social Connections: Moderately Isolated   Frequency of Communication with Friends and Family: Never   Frequency of Social Gatherings with Friends and Family: Once a week   Attends Religious Services: More than 4 times per year   Active Member of [employer] or Organizations: No   Attends Archivist Meetings: Never   Marital Status: Married  Human resources officer Violence: Not At Risk   Fear of Current or Ex-Partner: No   Emotionally Abused: No    Physically Abused: No   Sexually Abused: No      Family History  Problem Relation Age of Onset   Hypertension Mother    Hyperlipidemia Mother    Stroke Mother    Lung cancer Mother    Barrett's esophagus Mother    Colon cancer Father    Stomach cancer Father    Irritable bowel syndrome Sister    Alcohol abuse Brother    Cirrhosis Brother    Breast cancer Paternal Aunt    Ovarian cancer Paternal Aunt    Cancer Cousin    Cancer Cousin    Ovarian cancer Cousin    Kidney cancer Neg Hx    Kidney disease Neg Hx    Prostate cancer Neg Hx     Vitals:   11/12/21 1459  BP: 116/70  Pulse: 97  SpO2: 97%  Weight: 81.5 kg (179 lb 9.6 oz)     PHYSICAL EXAM: General:  Well appearing. No respiratory difficulty. Sitting in wheelchair. HEENT: normal Neck: supple. no JVD. Carotids 2+ bilat; no bruits. No lymphadenopathy or thyromegaly appreciated. Cor: PMI nondisplaced. Regular rate & rhythm. No rubs, gallops or murmurs. Lungs: clear Abdomen: soft, nontender, nondistended. No hepatosplenomegaly. No bruits or masses. Good bowel sounds. Extremities: no cyanosis, clubbing, rash, edema Neuro: alert & oriented x 3, cranial nerves grossly intact. moves all 4 extremities w/o difficulty. Affect pleasant.  ECG: SR 97 bpm   ASSESSMENT & PLAN: Syncope: -Intermittent episodes for > 12 years. -She's had extensive workup. Recent cMRI with no evidence of cardiac sarcoidosis.  -Previously saw EP. Tilt table study negative. No arrhythmias on prior event monitor -Has followed with Neuro for vestibular migraines and had negative ENT workup. -Suspect vasovagal. Conservative management recommended  2. Dyspnea with  exertion/Chest pain: -Echo 11/22: EF 60-65%, G1 DD, RV okay, inadequate TR signal to assess PA pressure -cMRI 01/23: EF 56%, no myocardial LGE, no evidence of cardiac sarcoidosis -Symptoms atypical for angina. Does not appear volume overloaded. However, symptoms very troublesome for her  and has some risk factors including HLD and prior tobacco. -Did not tolerate last stress test well  -Will arrange for [facility] for definitive assessment of coronaries and assess R sided pressures given known hx of pulmonary sarcoidosis  3. HLD: -LDL markedly elevated at 238 on last set of labs 10/21/21 -PCP started rosuvastatin 01/04 and following repeated lipid panel in 3 months -Potential side of Geodon which she was taking up until recently.  -If LDL remains significantly elevated, consider referral to lipid clinic for PCSK-9 inhibitor  4. Pulmonary sarcoid: - Mild restrictive defect on PFT 09/22 - Followed by pulmonary   Follow-up: Pending results of [facility]   [facility], [place], PA-C 11/12/21   Patient seen and examined with the above-signed Advanced Practice Provider and/or Housestaff. I personally reviewed laboratory data, imaging studies and relevant notes. I independently examined the patient and formulated the important aspects of the plan. I have edited the note to reflect any of my changes or salient points. I have personally discussed the plan with the patient and/or family.  56 y/o woman as above with recurrent syncope, progressive CP and marked exertional dyspnea.   General:  Well appearing. No resp difficulty HEENT: normal Neck: supple. no JVD. Carotids 2+ bilat; no bruits. No lymphadenopathy or thryomegaly appreciated. Cor: PMI nondisplaced. Regular rate & rhythm. No rubs, gallops or murmurs. Lungs: clear Abdomen: soft, nontender, nondistended. No hepatosplenomegaly. No bruits or masses. Good bowel sounds. Extremities: no cyanosis, clubbing, rash, edema Neuro: alert & orientedx3, cranial nerves grossly intact. moves all 4 extremities w/o difficulty. Affect pleasant  Suspect syncope is vagovagal in nature. However she also complains of progressive CP and severe exertional dyspnea. We discussed options for further evaluation and she wants to proceed with R/L cath for  definitive evaluation given severity of her symptoms. I agree with this approach.   Glori Bickers, MD  9:17 AM

## 2021-11-13 ENCOUNTER — Telehealth: Payer: Self-pay | Admitting: Family Medicine

## 2021-11-13 NOTE — Telephone Encounter (Signed)
Needs to go to emergency room, needs a CT scan  Also needs workup for why passing out

## 2021-11-13 NOTE — Telephone Encounter (Signed)
Spoke with patient who voices understanding that recommendation is for her to be seen in the ED.

## 2021-11-13 NOTE — Telephone Encounter (Signed)
Patient called stating that she had her epidural on 11/07/2021 for the neck and head pain she was having. She said that she was standing on a step ladder on Friday and passed out hitting her head and back on the floor.  She then passed out again on Sunday in the bathroom and hit her arm on the door knob and her head on the door. She has been having really bad headaches since.  She asked if there is anything that Dr Katrinka Blazing would recommend?  Please advise.  *Patient said that she passes out often.*

## 2021-11-13 NOTE — Telephone Encounter (Signed)
Refill if appropriate.  Please advise.  

## 2021-11-14 ENCOUNTER — Telehealth (HOSPITAL_COMMUNITY): Payer: Self-pay | Admitting: *Deleted

## 2021-11-19 ENCOUNTER — Other Ambulatory Visit: Payer: Self-pay

## 2021-11-19 ENCOUNTER — Other Ambulatory Visit: Payer: Self-pay | Admitting: Internal Medicine

## 2021-11-19 ENCOUNTER — Encounter (HOSPITAL_COMMUNITY): Payer: Self-pay | Admitting: Internal Medicine

## 2021-11-19 ENCOUNTER — Ambulatory Visit (HOSPITAL_COMMUNITY)
Admission: RE | Disposition: A | Discharge status: Home / Self Care | Payer: Self-pay | Source: Home / Self Care | Attending: Internal Medicine

## 2021-11-19 ENCOUNTER — Ambulatory Visit (HOSPITAL_COMMUNITY)
Admission: RE | Admit: 2021-11-19 | Discharge: 2021-11-19 | Disposition: A | Discharge status: Home / Self Care | Payer: Medicare Other | Attending: Internal Medicine | Admitting: Internal Medicine

## 2021-11-19 DIAGNOSIS — F319 Bipolar disorder, unspecified: Secondary | ICD-10-CM | POA: Diagnosis not present

## 2021-11-19 DIAGNOSIS — R0609 Other forms of dyspnea: Secondary | ICD-10-CM

## 2021-11-19 DIAGNOSIS — R079 Chest pain, unspecified: Secondary | ICD-10-CM | POA: Insufficient documentation

## 2021-11-19 DIAGNOSIS — R7989 Other specified abnormal findings of blood chemistry: Secondary | ICD-10-CM | POA: Diagnosis not present

## 2021-11-19 DIAGNOSIS — E039 Hypothyroidism, unspecified: Secondary | ICD-10-CM | POA: Diagnosis not present

## 2021-11-19 DIAGNOSIS — N183 Chronic kidney disease, stage 3 unspecified: Secondary | ICD-10-CM | POA: Insufficient documentation

## 2021-11-19 DIAGNOSIS — E1122 Type 2 diabetes mellitus with diabetic chronic kidney disease: Secondary | ICD-10-CM | POA: Diagnosis not present

## 2021-11-19 DIAGNOSIS — E785 Hyperlipidemia, unspecified: Secondary | ICD-10-CM | POA: Insufficient documentation

## 2021-11-19 DIAGNOSIS — D869 Sarcoidosis, unspecified: Secondary | ICD-10-CM

## 2021-11-19 DIAGNOSIS — R569 Unspecified convulsions: Secondary | ICD-10-CM

## 2021-11-19 DIAGNOSIS — R55 Syncope and collapse: Secondary | ICD-10-CM | POA: Insufficient documentation

## 2021-11-19 DIAGNOSIS — D860 Sarcoidosis of lung: Secondary | ICD-10-CM | POA: Insufficient documentation

## 2021-11-19 HISTORY — PX: RIGHT/LEFT HEART CATH AND CORONARY ANGIOGRAPHY: CATH118266

## 2021-11-19 LAB — POCT I-STAT EG7
Acid-base deficit: 5 mmol/L — ABNORMAL HIGH (ref 0.0–2.0)
Acid-base deficit: 6 mmol/L — ABNORMAL HIGH (ref 0.0–2.0)
Acid-base deficit: 9 mmol/L — ABNORMAL HIGH (ref 0.0–2.0)
Bicarbonate: 17.7 mmol/L — ABNORMAL LOW (ref 20.0–28.0)
Bicarbonate: 21.2 mmol/L (ref 20.0–28.0)
Bicarbonate: 22.6 mmol/L (ref 20.0–28.0)
Calcium, Ion: 0.77 mmol/L — CL (ref 1.15–1.40)
Calcium, Ion: 1.21 mmol/L (ref 1.15–1.40)
Calcium, Ion: 1.28 mmol/L (ref 1.15–1.40)
HCT: 28 % — ABNORMAL LOW (ref 36.0–46.0)
HCT: 34 % — ABNORMAL LOW (ref 36.0–46.0)
HCT: 36 % (ref 36.0–46.0)
Hemoglobin: 11.6 g/dL — ABNORMAL LOW (ref 12.0–15.0)
Hemoglobin: 12.2 g/dL (ref 12.0–15.0)
Hemoglobin: 9.5 g/dL — ABNORMAL LOW (ref 12.0–15.0)
O2 Saturation: 76 %
O2 Saturation: 78 %
O2 Saturation: 79 %
Potassium: 2.6 mmol/L — CL (ref 3.5–5.1)
Potassium: 3.6 mmol/L (ref 3.5–5.1)
Potassium: 3.8 mmol/L (ref 3.5–5.1)
Sodium: 143 mmol/L (ref 135–145)
Sodium: 144 mmol/L (ref 135–145)
Sodium: 152 mmol/L — ABNORMAL HIGH (ref 135–145)
TCO2: 19 mmol/L — ABNORMAL LOW (ref 22–32)
TCO2: 23 mmol/L (ref 22–32)
TCO2: 24 mmol/L (ref 22–32)
pCO2, Ven: 39.5 mmHg — ABNORMAL LOW (ref 44.0–60.0)
pCO2, Ven: 46.7 mmHg (ref 44.0–60.0)
pCO2, Ven: 51 mmHg (ref 44.0–60.0)
pH, Ven: 7.256 (ref 7.250–7.430)
pH, Ven: 7.26 (ref 7.250–7.430)
pH, Ven: 7.266 (ref 7.250–7.430)
pO2, Ven: 48 mmHg — ABNORMAL HIGH (ref 32.0–45.0)
pO2, Ven: 49 mmHg — ABNORMAL HIGH (ref 32.0–45.0)
pO2, Ven: 49 mmHg — ABNORMAL HIGH (ref 32.0–45.0)

## 2021-11-19 LAB — POCT I-STAT 7, (LYTES, BLD GAS, ICA,H+H)
Acid-base deficit: 5 mmol/L — ABNORMAL HIGH (ref 0.0–2.0)
Bicarbonate: 22.5 mmol/L (ref 20.0–28.0)
Calcium, Ion: 1.36 mmol/L (ref 1.15–1.40)
HCT: 37 % (ref 36.0–46.0)
Hemoglobin: 12.6 g/dL (ref 12.0–15.0)
O2 Saturation: 98 %
Potassium: 4 mmol/L (ref 3.5–5.1)
Sodium: 141 mmol/L (ref 135–145)
TCO2: 24 mmol/L (ref 22–32)
pCO2 arterial: 49.8 mmHg — ABNORMAL HIGH (ref 32.0–48.0)
pH, Arterial: 7.262 — ABNORMAL LOW (ref 7.350–7.450)
pO2, Arterial: 118 mmHg — ABNORMAL HIGH (ref 83.0–108.0)

## 2021-11-19 LAB — GLUCOSE, CAPILLARY
Glucose-Capillary: 134 mg/dL — ABNORMAL HIGH (ref 70–99)
Glucose-Capillary: 134 mg/dL — ABNORMAL HIGH (ref 70–99)

## 2021-11-19 SURGERY — RIGHT/LEFT HEART CATH AND CORONARY ANGIOGRAPHY
Anesthesia: LOCAL

## 2021-11-19 MED ORDER — MIDAZOLAM HCL 2 MG/2ML IJ SOLN
INTRAMUSCULAR | Status: AC
  Filled 2021-11-19: qty 2

## 2021-11-19 MED ORDER — FENTANYL CITRATE (PF) 100 MCG/2ML IJ SOLN
INTRAMUSCULAR | Status: DC | PRN
  Administered 2021-11-19 (×2): 25 ug via INTRAVENOUS

## 2021-11-19 MED ORDER — SODIUM CHLORIDE 0.9 % IV SOLN: INTRAVENOUS | Status: AC

## 2021-11-19 MED ORDER — MIDAZOLAM HCL 2 MG/2ML IJ SOLN
INTRAMUSCULAR | Status: DC | PRN
  Administered 2021-11-19: 2 mg via INTRAVENOUS
  Administered 2021-11-19: 1 mg via INTRAVENOUS

## 2021-11-19 MED ORDER — SODIUM CHLORIDE 0.9 % IV SOLN: 250.0000 mL | INTRAVENOUS | Status: DC | PRN

## 2021-11-19 MED ORDER — IOHEXOL 350 MG/ML SOLN
INTRAVENOUS | Status: DC | PRN
  Administered 2021-11-19: 40 mL

## 2021-11-19 MED ORDER — SODIUM CHLORIDE 0.9% FLUSH: 3.0000 mL | INTRAVENOUS | Status: DC | PRN

## 2021-11-19 MED ORDER — LIDOCAINE HCL (PF) 1 % IJ SOLN
INTRAMUSCULAR | Status: AC
  Filled 2021-11-19: qty 30

## 2021-11-19 MED ORDER — SODIUM CHLORIDE 0.9% FLUSH: 3.0000 mL | Freq: Two times a day (BID) | INTRAVENOUS | Status: DC

## 2021-11-19 MED ORDER — HEPARIN (PORCINE) IN NACL 1000-0.9 UT/500ML-% IV SOLN
INTRAVENOUS | Status: DC | PRN
  Administered 2021-11-19 (×2): 500 mL

## 2021-11-19 MED ORDER — VERAPAMIL HCL 2.5 MG/ML IV SOLN
INTRAVENOUS | Status: AC
  Filled 2021-11-19: qty 2

## 2021-11-19 MED ORDER — SODIUM CHLORIDE 0.9 % IV SOLN: INTRAVENOUS | Status: DC

## 2021-11-19 MED ORDER — ACETAMINOPHEN 325 MG PO TABS: 650.0000 mg | ORAL_TABLET | ORAL | Status: DC | PRN

## 2021-11-19 MED ORDER — LABETALOL HCL 5 MG/ML IV SOLN: 10.0000 mg | INTRAVENOUS | Status: DC | PRN

## 2021-11-19 MED ORDER — ONDANSETRON HCL 4 MG/2ML IJ SOLN: 4.0000 mg | Freq: Four times a day (QID) | INTRAMUSCULAR | Status: DC | PRN

## 2021-11-19 MED ORDER — ASPIRIN 81 MG PO CHEW: 81.0000 mg | CHEWABLE_TABLET | Freq: Once | ORAL | Status: DC

## 2021-11-19 MED ORDER — HEPARIN SODIUM (PORCINE) 1000 UNIT/ML IJ SOLN
INTRAMUSCULAR | Status: DC | PRN
  Administered 2021-11-19: 4000 [IU] via INTRAVENOUS

## 2021-11-19 MED ORDER — HEPARIN (PORCINE) IN NACL 1000-0.9 UT/500ML-% IV SOLN
INTRAVENOUS | Status: AC
  Filled 2021-11-19: qty 1000

## 2021-11-19 MED ORDER — LIDOCAINE HCL (PF) 1 % IJ SOLN
INTRAMUSCULAR | Status: DC | PRN
  Administered 2021-11-19 (×2): 2 mL via INTRADERMAL

## 2021-11-19 MED ORDER — HEPARIN SODIUM (PORCINE) 1000 UNIT/ML IJ SOLN
INTRAMUSCULAR | Status: AC
  Filled 2021-11-19: qty 10

## 2021-11-19 MED ORDER — FENTANYL CITRATE (PF) 100 MCG/2ML IJ SOLN
INTRAMUSCULAR | Status: AC
  Filled 2021-11-19: qty 2

## 2021-11-19 MED ORDER — HYDRALAZINE HCL 20 MG/ML IJ SOLN: 10.0000 mg | INTRAMUSCULAR | Status: DC | PRN

## 2021-11-19 SURGICAL SUPPLY — 9 items
CATH 5FR JL3.5 JR4 ANG PIG MP (CATHETERS) ×1 IMPLANT
CATH BALLN WEDGE 5F 110CM (CATHETERS) ×1 IMPLANT
DEVICE RAD COMP TR BAND LRG (VASCULAR PRODUCTS) ×1 IMPLANT
GLIDESHEATH SLEND SS 6F .021 (SHEATH) ×1 IMPLANT
GUIDEWIRE INQWIRE 1.5J.035X260 (WIRE) IMPLANT
INQWIRE 1.5J .035X260CM (WIRE) ×2
PACK CARDIAC CATHETERIZATION (CUSTOM PROCEDURE TRAY) ×3 IMPLANT
SHEATH GLIDE SLENDER 4/5FR (SHEATH) ×1 IMPLANT
TRANSDUCER W/STOPCOCK (MISCELLANEOUS) ×3 IMPLANT

## 2021-11-19 NOTE — Discharge Instructions (Signed)
Radial Site Care  This sheet gives you information about how to care for yourself after your procedure. Your health care provider may also give you more specific instructions. If you have problems or questions, contact your health care provider. What can I expect after the procedure? After the procedure, it is common to have: Bruising and tenderness at the catheter insertion area. Follow these instructions at home: Medicines Take over-the-counter and prescription medicines only as told by your health care provider. Insertion site care Follow instructions from your health care provider about how to take care of your insertion site. Make sure you: Wash your hands with soap and water before you remove your bandage (dressing). If soap and water are not available, use hand sanitizer. May remove dressing in 24 hours. Check your insertion site every day for signs of infection. Check for: Redness, swelling, or pain. Fluid or blood. Pus or a bad smell. Warmth. Do no take baths, swim, or use a hot tub for 5 days. You may shower 24-48 hours after the procedure. Remove the dressing and gently wash the site with plain soap and water. Pat the area dry with a clean towel. Do not rub the site. That could cause bleeding. Do not apply powder or lotion to the site. Activity  For 24 hours after the procedure, or as directed by your health care provider: Do not flex or bend the affected arm. Do not push or pull heavy objects with the affected arm. Do not drive yourself home from the hospital or clinic. You may drive 24 hours after the procedure. Do not operate machinery or power tools. KEEP ARM ELEVATED THE REMAINDER OF THE DAY. Do not push, pull or lift anything that is heavier than 10 lb for 5 days. Ask your health care provider when it is okay to: Return to work or school. Resume usual physical activities or sports. Resume sexual activity. General instructions If the catheter site starts to  bleed, raise your arm and put firm pressure on the site. If the bleeding does not stop, get help right away. This is a medical emergency. DRINK PLENTY OF FLUIDS FOR THE NEXT 2-3 DAYS. No alcohol consumption for 24 hours after receiving sedation. If you went home on the same day as your procedure, a responsible adult should be with you for the first 24 hours after you arrive home. Keep all follow-up visits as told by your health care provider. This is important. Contact a health care provider if: You have a fever. You have redness, swelling, or yellow drainage around your insertion site. Get help right away if: You have unusual pain at the radial site. The catheter insertion area swells very fast. The insertion area is bleeding, and the bleeding does not stop when you hold steady pressure on the area. Your arm or hand becomes pale, cool, tingly, or numb. These symptoms may represent a serious problem that is an emergency. Do not wait to see if the symptoms will go away. Get medical help right away. Call your local emergency services (911 in the [place] Do not drive yourself to the hospital. Summary After the procedure, it is common to have bruising and tenderness at the site. Follow instructions from your health care provider about how to take care of your radial site wound. Check the wound every day for signs of infection.  This information is not intended to replace advice given to you by your health care provider. Make sure you discuss any questions you have with   your health care provider. Document Revised: 11/10/2017 Document Reviewed: 11/10/2017 Elsevier Patient Education  2020 Elsevier Inc.  

## 2021-11-19 NOTE — Interval H&P Note (Signed)
History and Physical Interval Note:  11/19/2021 9:20 AM  Jennifer Bright  has presented today for surgery, with the diagnosis of heart failure.  The various methods of treatment have been discussed with the patient and family. After consideration of risks, benefits and other options for treatment, the patient has consented to  Procedure(s): RIGHT/LEFT HEART CATH AND CORONARY ANGIOGRAPHY (N/A) and possible coronary angioplasty as a surgical intervention.  The patient's history has been reviewed, patient examined, no change in status, stable for surgery.  I have reviewed the patient's chart and labs.  Questions were answered to the patient's satisfaction.     [name]

## 2021-11-20 NOTE — Telephone Encounter (Signed)
Requested medication (s) are due for refill today: yes  Requested medication (s) are on the active medication list: yes  Last refill:  09/30/21  Future visit scheduled: 01/20/22  Notes to clinic:  Failed protocol of labs, Primidone, has upcoming appt, please assess.     Requested Prescriptions  Pending Prescriptions Disp Refills   primidone (MYSOLINE) 50 MG tablet [Pharmacy Med Name: Primidone 50 MG Oral Tablet] 360 tablet 3    Sig: TAKE 1 TABLET BY MOUTH  TWICE DAILY AT 10 AM AND 5  PM AND 2 TABLETS BY MOUTH  AT BEDTIME     Neurology:  Anticonvulsants - primidone Failed - 11/19/2021  3:35 PM      Failed - Cr in normal range and within 360 days    Creatinine  Date Value Ref Range Status  10/10/2014 0.79 0.60 - 1.30 mg/dL Final   Creatinine, Ser  Date Value Ref Range Status  11/12/2021 1.07 (H) 0.44 - 1.00 mg/dL Final          Failed - Primidone (Serum) in normal range and within 360 days    No results found for: PRIMIDONE        Passed - HCT in normal range and within 360 days    HCT  Date Value Ref Range Status  11/19/2021 34.0 (L) 36.0 - 46.0 % Final   Hematocrit  Date Value Ref Range Status  10/21/2021 39.1 34.0 - 46.6 % Final          Passed - HGB in normal range and within 360 days    Hemoglobin  Date Value Ref Range Status  11/19/2021 11.6 (L) 12.0 - 15.0 g/dL Final  10/21/2021 13.5 11.1 - 15.9 g/dL Final          Passed - PLT in normal range and within 360 days    Platelets  Date Value Ref Range Status  11/12/2021 235 150 - 400 K/uL Final  10/21/2021 200 150 - 450 x10E3/uL Final          Passed - WBC in normal range and within 360 days    WBC  Date Value Ref Range Status  11/12/2021 6.9 4.0 - 10.5 K/uL Final          Passed - AST in normal range and within 360 days    AST  Date Value Ref Range Status  10/21/2021 16 0 - 40 IU/L Final   SGOT(AST)  Date Value Ref Range Status  10/10/2014 12 (L) 15 - 37 Unit/L Final          Passed - ALT  in normal range and within 360 days    ALT  Date Value Ref Range Status  10/21/2021 20 0 - 32 IU/L Final   SGPT (ALT)  Date Value Ref Range Status  10/10/2014 38 U/L Final    Comment:    14-63 NOTE: New Reference Range 05/08/14           Passed - Completed PHQ-2 or PHQ-9 in the last 360 days      Passed - Valid encounter within last 12 months    Recent Outpatient Visits           3 weeks ago Hoarseness of voice   [facility] Glean Hess, MD   1 month ago Prediabetes   [facility] Glean Hess, MD   4 months ago Annual physical exam   [facility] Glean Hess, MD   7 months ago  Hypothyroidism, unspecified type   [facility] Glean Hess, MD   10 months ago Acute pain of right knee   [facility] Glean Hess, MD       Future Appointments             In 2 months Margaretha Seeds, MD [facility]   In 2 months Army Melia Jesse Sans, MD [facility]

## 2021-11-21 ENCOUNTER — Ambulatory Visit: Payer: Self-pay | Admitting: *Deleted

## 2021-11-21 MED FILL — Verapamil HCl IV Soln 2.5 MG/ML: INTRAVENOUS | Qty: 2 | Status: AC

## 2021-11-21 NOTE — Telephone Encounter (Signed)
Summary: advice - incision aftercare   Pt stated she recently had a procedure and has a incision in her arm,  pt is not sure how to properly treat the incision, she stated she now sees a blister near the incision that is itchy, pt was not sure what to do.        Chief Complaint: what to do about blister noted close to incision Symptoms: blister fluid filled size of an eraser noted 2 inches from incision on left arm after cardiac cath procedure on 11/19/21. Small amount of redness Frequency: noted today  Pertinent Negatives: Patient denies drainage at incision, no fever Disposition: [] ED /[] Urgent Care (no appt availability in office) / [] Appointment(In office/virtual)/ []  [facility]/ [x] Home Care/ [] Refused Recommended Disposition /[] Cut Off Mobile Bus/ []  Follow-up with PCP Additional Notes:  Patient requesting a call back from PCP regarding information of cardiac cath . Instructed patient to follow post op skin incision and left arm care. Do not burst blister or peel skin back if blister burst . Keep clean and dry and go to UC or ED if worsening symptoms over weekend .      Answer Assessment - Initial Assessment Questions 1. SYMPTOM: "What's the main symptom you're concerned about?" (e.g., drainage, incision opening up, pain, redness)     Blister noted 2 inches from incision on left arm after cardiac cath  2. ONSET: "When did na  start?"     na 3. SURGERY: "What surgery did you have?"     Cardiac cath 4. DATE of SURGERY: "When was the surgery?"      11/19/21 5. INCISION SITE: "Where is the incision located?"      Left arm 6. REDNESS: "Is there any redness at the incision site?" If yes, ask: "How wide across is the redness?" (Inches, centimeters)      Small amount of redness and 1 blister approx size of and eraser 7. PAIN: "Is there any pain?" If Yes, ask: "How bad is it?"  (Scale 1-10; or mild, moderate, severe)   - NONE (0): no pain   - MILD (1-3): doesn't interfere  with normal activities    - MODERATE (4-7): interferes with normal activities or awakens from sleep    - SEVERE (8-10): excruciating pain, unable to do any normal activities     na 8. BLEEDING: "Is there any bleeding?" If Yes, ask: "How much?" and "Where?"     na 9. DRAINAGE: "Is there any drainage from the incision site?" If yes, ask: "What color and how much?" (e.g., red, cloudy, pus; drops, teaspoon)     na 10. FEVER: "Do you have a fever?" If Yes, ask: "What is your temperature, how was it measured, and when did it start?"       na 11. OTHER SYMPTOMS: "Do you have any other symptoms?" (e.g., dizziness, rash elsewhere on body, shaking chills, weakness)       Blister 2 inches from incision  Protocols used: Post-Op Incision Symptoms and Questions-A-AH

## 2021-11-25 ENCOUNTER — Ambulatory Visit: Payer: Medicare Other | Admitting: Obstetrics & Gynecology

## 2021-11-27 ENCOUNTER — Encounter: Payer: Self-pay | Admitting: Pulmonary Disease

## 2021-11-28 ENCOUNTER — Telehealth: Payer: Self-pay

## 2021-11-28 NOTE — Telephone Encounter (Signed)
-----   Message from Montel Culver, CPhT sent at 11/27/2021  3:16 PM EST ----- Regarding: Medication Assistance [place] Found   I am trying to get Ms. McIntrye to get her assistance applications in for [employer].  I've spoke with her twice over the last 6 weeks, she has told me several times she was sending them in and I have not received them.  I tried to call her today and had to leave a message.  Do you have any ideas on how to get in touch with her or what we can do to get her to send them in?  I know she called you at the beginning of January asking about them, and I confirmed with her on the 6th that's she received them.  Thanks for any help you can give.  Tresea Mall  Certified Pharmacy Technician [facility]  [address]nd floor  [place], [place] [address]   Office: [phone] Mobile: [phone] Fax: [phone] Email: [name]

## 2021-11-28 NOTE — Telephone Encounter (Signed)
Responded to message that pt is waiting on insurance papers from this year to complete the application.  KP

## 2021-12-01 ENCOUNTER — Ambulatory Visit: Payer: Medicare Other | Admitting: Obstetrics & Gynecology

## 2021-12-02 NOTE — Progress Notes (Signed)
PCP: Glean Hess, MD   Chief Complaint  Patient presents with   Gynecologic Exam    Pain during intercourse    HPI:      [name] is a 56 y.o. [id number] whose LMP was No LMP recorded. Patient has had a hysterectomy., presents today for her annual examination.  Her menses are absent due to supracx 2004 for endometriosis 2004 and BSO by 2008. She does not have vasomotor sx.   Sex activity: single partner, contraception - status post hysterectomy. She does have vaginal dryness and pain during sex, particularly at the vag opening initially. Uses lubricants without relief. Also with decreased libido and pt upset about this. Pt also on several meds that can affect this. Pt with FH breast cancer and she doesn't want hormones.  Last Pap: 08/10/18 Results were: no abnormalities /neg HPV DNA.  Hx of STDs: external genital warts as a teenager; treated  Saw Dr. Kenton Kingfisher for LLQ pain 8/22; since pt is s/p hystBSO and has hx of adhesions, he recommended ref to gen surg. Pt still has occas LLQ pain.   Last mammogram: 11/14/20 at [facility]; Results were: normal--routine follow-up in 12 months There is a FH of breast cancer in 2 pat aunts, and a FH of ovarian cancer in her pat aunt with breast cancer; pt doesn't meet medicare cancer genetic testing guidelines.    Colonoscopy: 2018 at [facility]   Tobacco use: none; quit in 2018 Alcohol use: none Exercise: not active  Labs with PCP.   Patient Active Problem List   Diagnosis Date Noted   Restrictive lung disease 07/18/2021   Shortness of breath 06/29/2021   Lumbar radiculopathy 05/27/2021   Gastritis without bleeding    S/P total abdominal hysterectomy and bilateral salpingo-oophorectomy 08/14/2020   Chronic idiopathic constipation 08/14/2020   Depression, major, recurrent, severe with psychosis ([place]) 06/26/2020   Seizure-like activity ([place]) 06/04/2020   Urinary incontinence without sensory awareness 05/21/2020   Bipolar 2 disorder,  major depressive episode ([place]) 09/19/2019   Stage 3a chronic kidney disease ([place]) 07/10/2019   Proteinuria 07/10/2019   Myalgia 12/30/2017   Vaginal atrophy 07/12/2017   Polypharmacy 08/14/2016   Sarcoidosis 08/14/2016   Hypercalcemia 07/24/2016   Mixed hyperlipidemia 11/15/2015   Prediabetes 11/15/2015   Anxiety disorder 07/29/2015   Benign essential tremor 05/12/2015   GERD with esophagitis 05/12/2015   Hypersomnia with sleep apnea 05/12/2015   Restless leg 05/12/2015   Acquired hypothyroidism 05/12/2015   Chronic migraine without aura 09/03/2012   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    Past Surgical History:  Procedure Laterality Date   APPENDECTOMY     BLADDER SURGERY     bladder tact   CATARACT EXTRACTION W/PHACO Right 09/21/2018   Procedure: CATARACT EXTRACTION PHACO AND INTRAOCULAR LENS PLACEMENT ([place]);  Surgeon: [name], MD;  Location: [facility] ORS;  Service: Ophthalmology;  Laterality: Right;  [place]  00:20 CDE 00.82 Fluid pack lot # [id number]   CATARACT EXTRACTION W/PHACO Left 10/17/2018   Procedure: CATARACT EXTRACTION PHACO AND INTRAOCULAR LENS PLACEMENT ([place])  LEFT;  Surgeon: [name], MD;  Location: [place];  Service: Ophthalmology;  Laterality: Left;  diabetic - diet controlled   CESAREAN SECTION     CHOLECYSTECTOMY     COLONOSCOPY WITH PROPOFOL N/A 04/05/2017   Procedure: COLONOSCOPY WITH PROPOFOL;  Surgeon: [name], MD;  Location: [facility] ENDOSCOPY;  Service: Endoscopy;  Laterality: N/A;   ESOPHAGOGASTRODUODENOSCOPY (EGD) WITH PROPOFOL N/A 08/28/2016  Procedure: ESOPHAGOGASTRODUODENOSCOPY (EGD) WITH PROPOFOL;  Surgeon: Christena Deem, MD;  Location: [facility] ENDOSCOPY;  Service: Endoscopy;  Laterality: N/A;   ESOPHAGOGASTRODUODENOSCOPY (EGD) WITH PROPOFOL N/A 11/08/2020   Procedure: ESOPHAGOGASTRODUODENOSCOPY (EGD) WITH PROPOFOL w// biopsy;  Surgeon: Midge Minium, MD;  Location: [facility];  Service: Endoscopy;   Laterality: N/A;  Pre-Diabetic   HERNIA REPAIR     INGUINAL HERNIA REPAIR     rt and left   LAPAROSCOPIC SUPRACERVICAL HYSTERECTOMY  2004   lexiscan cardiolite     RIGHT/LEFT HEART CATH AND CORONARY ANGIOGRAPHY N/A 11/19/2021   Procedure: RIGHT/LEFT HEART CATH AND CORONARY ANGIOGRAPHY;  Surgeon: Dolores Patty, MD;  Location: [facility] INVASIVE CV LAB;  Service: Cardiovascular;  Laterality: N/A;   SALPINGOOPHORECTOMY Bilateral    RSO 2007 with adhesions, LSO 2008 with Dr. Lemar Livings   TILT TABLE STUDY N/A 04/01/2012   Procedure: TILT TABLE STUDY;  Surgeon: Duke Salvia, MD;  Location: [facility] CATH LAB;  Service: Cardiovascular;  Laterality: N/A;    Family History  Problem Relation Age of Onset   Hypertension Mother    Hyperlipidemia Mother    Stroke Mother    Lung cancer Mother    Barrett's esophagus Mother    Colon cancer Father    Stomach cancer Father    Irritable bowel syndrome Sister    Alcohol abuse Brother    Cirrhosis Brother    Breast cancer Paternal Aunt    Ovarian cancer Paternal Aunt    Cancer Cousin    Cancer Cousin    Ovarian cancer Cousin    Kidney cancer Neg Hx    Kidney disease Neg Hx    Prostate cancer Neg Hx     Social History   Socioeconomic History   Marital status: Married    Spouse name: Not on file   Number of children: Not on file   Years of education: Not on file   Highest education level: Not on file  Occupational History   Occupation: disabled  Tobacco Use   Smoking status: Former    Types: Cigarettes    Start date: 2011    Quit date: 2018    Years since quitting: 5.1   Smokeless tobacco: Never  Vaping Use   Vaping Use: Never used  Substance and Sexual Activity   Alcohol use: No   Drug use: No   Sexual activity: Yes  Other Topics Concern   Not on file  Social History Narrative   Not on file   Social Determinants of Health   Financial Resource Strain: Medium Risk   Difficulty of Paying Living Expenses: Somewhat hard  Food  Insecurity: No Food Insecurity   Worried About Programme researcher, broadcasting/film/video in the Last Year: Never true   Ran Out of Food in the Last Year: Never true  Transportation Needs: No Transportation Needs   Lack of Transportation (Medical): No   Lack of Transportation (Non-Medical): No  Physical Activity: Insufficiently Active   Days of Exercise per Week: 7 days   Minutes of Exercise per Session: 20 min  Stress: Stress Concern Present   Feeling of Stress : Very much  Social Connections: Moderately Isolated   Frequency of Communication with Friends and Family: Never   Frequency of Social Gatherings with Friends and Family: Once a week   Attends Religious Services: More than 4 times per year   Active Member of [employer] or Organizations: No   Attends Banker Meetings: Never   Marital Status: Married  Intimate Partner Violence: Not At Risk   Fear of Current or Ex-Partner: No   Emotionally Abused: No   Physically Abused: No   Sexually Abused: No     Current Outpatient Medications:    ALPRAZolam (XANAX XR) 1 MG 24 hr tablet, Take 1 mg by mouth daily. Dr. Toy Care, Disp: , Rfl:    ALPRAZolam (XANAX) 1 MG tablet, Take 1 mg by mouth 3 (three) times daily as needed for anxiety., Disp: , Rfl:    amphetamine-dextroamphetamine (ADDERALL) 30 MG tablet, Take 30 mg by mouth daily., Disp: , Rfl:    benztropine (COGENTIN) 1 MG tablet, Take 1 mg by mouth 2 (two) times daily. Dr. Toy Care, Disp: , Rfl:    Cholecalciferol (VITAMIN D3) 50 MCG (2000 UT) capsule, Take 2,000 Units by mouth daily., Disp: , Rfl:    conjugated estrogens (PREMARIN) vaginal cream, Insert 1 g vaginally nightly for 7 nights then once weekly as maintenance, Disp: 30 g, Rfl: 2   cyclobenzaprine (FLEXERIL) 10 MG tablet, Take 1 tablet (10 mg total) by mouth 3 (three) times daily as needed for muscle spasms., Disp: 30 tablet, Rfl: 1   docusate sodium (COLACE) 100 MG capsule, Take 100 mg by mouth 2 (two) times daily., Disp: , Rfl:    DULoxetine  (CYMBALTA) 60 MG capsule, Take 120 mg by mouth daily. Dr. Toy Care, Disp: , Rfl:    furosemide (LASIX) 20 MG tablet, Take 1 tablet (20 mg total) by mouth daily as needed. Prescribed by PCP, Disp: 30 tablet, Rfl: 5   gabapentin (NEURONTIN) 300 MG capsule, Take 300 mg by mouth 2 (two) times daily., Disp: , Rfl:    hydrOXYzine (VISTARIL) 25 MG capsule, Take 25 mg by mouth 3 (three) times daily as needed for anxiety., Disp: , Rfl:    levothyroxine (SYNTHROID) 100 MCG tablet, Take 1 tablet (100 mcg total) by mouth daily before breakfast., Disp: 90 tablet, Rfl: 1   Melatonin 10 MG TABS, Take 10 mg by mouth at bedtime., Disp: , Rfl:    methylPREDNISolone (MEDROL DOSEPAK) 4 MG TBPK tablet, TAKE AS DIRECTED PER PACKAGE, Disp: , Rfl:    Multiple Vitamin (MULTIVITAMIN) capsule, Take 1 capsule by mouth daily., Disp: , Rfl:    Omega-3 Fatty Acids (FISH OIL) 1000 MG CAPS, Take 1,000 mg by mouth daily., Disp: , Rfl:    pantoprazole (PROTONIX) 40 MG tablet, TAKE ONE TABLET EVERY DAY 15-20 MINUTES BEFORE A MEAL (Patient taking differently: Take 40 mg by mouth 2 (two) times daily before a meal.), Disp: 30 tablet, Rfl: 1   primidone (MYSOLINE) 50 MG tablet, TAKE 1 TABLET BY MOUTH  TWICE DAILY AT 10 AM AND 5  PM AND 2 TABLETS BY MOUTH  AT BEDTIME, Disp: 360 tablet, Rfl: 0   rosuvastatin (CRESTOR) 5 MG tablet, Take 1 tablet (5 mg total) by mouth daily., Disp: 90 tablet, Rfl: 0   topiramate (TOPAMAX) 200 MG tablet, TAKE ONE TABLET BY MOUTH EVERY MORNING AND TAKE ONE TABLET EVERY EVENING Strength: 200 mg, Disp: 60 tablet, Rfl: 5   traZODone (DESYREL) 50 MG tablet, Take 150 mg by mouth at bedtime., Disp: , Rfl:      ROS:  Review of Systems  Constitutional:  Negative for fatigue, fever and unexpected weight change.  Respiratory:  Negative for cough, shortness of breath and wheezing.   Cardiovascular:  Negative for chest pain, palpitations and leg swelling.  Gastrointestinal:  Negative for blood in stool, constipation,  diarrhea, nausea and vomiting.  Endocrine: Negative  for cold intolerance, heat intolerance and polyuria.  Genitourinary:  Negative for dyspareunia, dysuria, flank pain, frequency, genital sores, hematuria, menstrual problem, pelvic pain, urgency, vaginal bleeding, vaginal discharge and vaginal pain.  Musculoskeletal:  Negative for back pain, joint swelling and myalgias.  Skin:  Negative for rash.  Neurological:  Negative for dizziness, syncope, light-headedness, numbness and headaches.  Hematological:  Negative for adenopathy.  Psychiatric/Behavioral:  Negative for agitation, confusion, sleep disturbance and suicidal ideas. The patient is not nervous/anxious.   BREAST: No symptoms    Objective: BP 96/64    Ht 5\' 4"  (1.626 m)    Wt 183 lb (83 kg)    BMI 31.41 kg/m    Physical Exam Constitutional:      Appearance: She is well-developed.  Genitourinary:     Vulva normal.     Genitourinary Comments: UTERUS  SURG REM     Right Labia: No rash, tenderness or lesions.    Left Labia: No tenderness, lesions or rash.    Vaginal cuff intact.    No vaginal discharge, erythema or tenderness.     Mild vaginal atrophy present.     Right Adnexa: not tender and no mass present.    Left Adnexa: not tender and no mass present.    Cervix is not absent.     Uterus is absent.  Breasts:    Right: No mass, nipple discharge, skin change or tenderness.     Left: No mass, nipple discharge, skin change or tenderness.  Neck:     Thyroid: No thyromegaly.  Cardiovascular:     Rate and Rhythm: Normal rate and regular rhythm.     Heart sounds: Normal heart sounds. No murmur heard. Pulmonary:     Effort: Pulmonary effort is normal.     Breath sounds: Normal breath sounds.  Abdominal:     Palpations: Abdomen is soft.     Tenderness: There is no abdominal tenderness. There is no guarding.  Musculoskeletal:        General: Normal range of motion.     Cervical back: Normal range of motion.  Neurological:      General: No focal deficit present.     Mental Status: She is alert and oriented to person, place, and time.     Cranial Nerves: No cranial nerve deficit.  Skin:    General: Skin is warm and dry.  Psychiatric:        Mood and Affect: Mood normal.        Behavior: Behavior normal.        Thought Content: Thought content normal.        Judgment: Judgment normal.  Vitals reviewed.    Assessment/Plan:  Encounter for annual routine gynecological examination  Encounter for screening mammogram for malignant neoplasm of breast; pt can schedule at [facility], will call for ref prn.  Dyspareunia in female - Plan: conjugated estrogens (PREMARIN) vaginal cream; discussed etiology of sx in postmenopausal women. Vaginal crm doesn't have systemic absorption and is safe with FH. Pt would like to try. 1 sample given, Rx eRxd. Continue to use lubricants. F/u prn.   Postmenopausal atrophic vaginitis - Plan: conjugated estrogens (PREMARIN) vaginal cream  LLQ pain--pt to find out what gen surg is in network and will call for referral  Meds ordered this encounter  Medications   conjugated estrogens (PREMARIN) vaginal cream    Sig: Insert 1 g vaginally nightly for 7 nights then once weekly as maintenance    Dispense:  30 g  Refill:  2    Order Specific Question:   Supervising Provider    Answer:   Gae Dry [id number]            GYN counsel breast self exam, mammography screening, menopause    F/U  Return in about 1 year (around 12/03/2022).  Savreen Gebhardt B. Thaddaeus Granja, PA-C 12/03/2021 11:34 AM

## 2021-12-03 ENCOUNTER — Encounter: Payer: Self-pay | Admitting: Obstetrics and Gynecology

## 2021-12-03 ENCOUNTER — Ambulatory Visit (INDEPENDENT_AMBULATORY_CARE_PROVIDER_SITE_OTHER): Payer: Medicare Other | Admitting: Obstetrics and Gynecology

## 2021-12-03 ENCOUNTER — Other Ambulatory Visit: Payer: Self-pay

## 2021-12-03 VITALS — BP 96/64 | Ht 64.0 in | Wt 183.0 lb

## 2021-12-03 DIAGNOSIS — Z1231 Encounter for screening mammogram for malignant neoplasm of breast: Secondary | ICD-10-CM | POA: Diagnosis not present

## 2021-12-03 DIAGNOSIS — N952 Postmenopausal atrophic vaginitis: Secondary | ICD-10-CM | POA: Diagnosis not present

## 2021-12-03 DIAGNOSIS — Z1211 Encounter for screening for malignant neoplasm of colon: Secondary | ICD-10-CM

## 2021-12-03 DIAGNOSIS — Z01419 Encounter for gynecological examination (general) (routine) without abnormal findings: Secondary | ICD-10-CM | POA: Diagnosis not present

## 2021-12-03 DIAGNOSIS — N941 Unspecified dyspareunia: Secondary | ICD-10-CM

## 2021-12-03 MED ORDER — PREMARIN 0.625 MG/GM VA CREA: TOPICAL_CREAM | VAGINAL | 2 refills | Status: AC

## 2021-12-03 NOTE — Patient Instructions (Signed)
I value your feedback and you entrusting us with your care. If you get a [facility] patient survey, I would appreciate you taking the time to let us know about your experience today. Thank you! ? ? ?

## 2021-12-17 ENCOUNTER — Ambulatory Visit (HOSPITAL_COMMUNITY): Payer: Medicare Other

## 2021-12-17 ENCOUNTER — Other Ambulatory Visit (HOSPITAL_COMMUNITY): Payer: Medicare Other

## 2021-12-19 ENCOUNTER — Telehealth: Payer: Self-pay | Admitting: Pulmonary Disease

## 2021-12-19 NOTE — Telephone Encounter (Signed)
This would not be a PA for Pharmacy benefits. We do not handle medical PA.

## 2021-12-23 ENCOUNTER — Other Ambulatory Visit: Payer: Self-pay | Admitting: Internal Medicine

## 2021-12-23 DIAGNOSIS — M62838 Other muscle spasm: Secondary | ICD-10-CM

## 2021-12-24 NOTE — Telephone Encounter (Signed)
Requested Prescriptions  ?Pending Prescriptions Disp Refills  ?? rosuvastatin (CRESTOR) 5 MG tablet [Pharmacy Med Name: ROSUVASTATIN CALCIUM 5 MG TAB] 90 tablet 0  ?  Sig: TAKE 1 TABLET BY MOUTH DAILY  ?  ? Cardiovascular:  Antilipid - Statins 2 Failed - 12/23/2021  3:36 PM  ?  ?  Failed - Cr in normal range and within 360 days  ?  Creatinine  ?Date Value Ref Range Status  ?10/10/2014 0.79 0.60 - 1.30 mg/dL Final  ? ?Creatinine, Ser  ?Date Value Ref Range Status  ?11/12/2021 1.07 (H) 0.44 - 1.00 mg/dL Final  ?   ?  ?  Failed - Lipid Panel in normal range within the last 12 months  ?  Cholesterol, Total  ?Date Value Ref Range Status  ?10/21/2021 333 (H) 100 - 199 mg/dL Final  ? ?Cholesterol  ?Date Value Ref Range Status  ?05/17/2014 157 0 - 200 mg/dL Final  ? ?Ldl Cholesterol, Calc  ?Date Value Ref Range Status  ?05/17/2014 74 0 - 100 mg/dL Final  ? ?LDL Chol Calc (NIH)  ?Date Value Ref Range Status  ?10/21/2021 238 (H) 0 - 99 mg/dL Final  ? ?HDL Cholesterol  ?Date Value Ref Range Status  ?05/17/2014 29 (L) 40 - 60 mg/dL Final  ? ?HDL  ?Date Value Ref Range Status  ?10/21/2021 58 >39 mg/dL Final  ? ?Triglycerides  ?Date Value Ref Range Status  ?10/21/2021 191 (H) 0 - 149 mg/dL Final  ?64/40/3474 259 (H) 0 - 200 mg/dL Final  ? ?  ?  ?  Passed - Patient is not pregnant  ?  ?  Passed - Valid encounter within last 12 months  ?  Recent Outpatient Visits   ?      ? 1 month ago Hoarseness of voice  ? [facility] Reubin Milan, MD  ? 2 months ago Prediabetes  ? [facility] Reubin Milan, MD  ? 5 months ago Annual physical exam  ? [facility] Reubin Milan, MD  ? 8 months ago Hypothyroidism, unspecified type  ? [facility] Reubin Milan, MD  ? 11 months ago Acute pain of right knee  ? [facility] Reubin Milan, MD  ?  ?  ?Future Appointments   ?        ? In 3 weeks Luciano Cutter, MD [facility]  ? In 3 weeks Judithann Graves Nyoka Cowden, MD [facility]  ?  ? ?  ?  ?  ?? cyclobenzaprine (FLEXERIL) 10 MG tablet [Pharmacy Med Name: CYCLOBENZAPRINE HCL 10 MG TAB] 30 tablet 1  ?  Sig: TAKE ONE TABLET BY MOUTH 3 TIMES DAILY AS NEEDED FOR MUSCLE SPASM  ?  ? Not Delegated - Analgesics:  Muscle Relaxants Failed - 12/23/2021  3:36 PM  ?  ?  Failed - This refill cannot be delegated  ?  ?  Passed - Valid encounter within last 6 months  ?  Recent Outpatient Visits   ?      ? 1 month ago Hoarseness of voice  ? [facility] Reubin Milan, MD  ? 2 months ago Prediabetes  ? [facility] Reubin Milan, MD  ? 5 months ago Annual physical exam  ? [facility] Reubin Milan, MD  ? 8 months ago Hypothyroidism, unspecified type  ? [facility] Reubin Milan, MD  ? 11 months ago Acute pain  of right knee  ? [facility] Reubin Milan, MD  ?  ?  ?Future Appointments   ?        ? In 3 weeks Luciano Cutter, MD [facility]  ? In 3 weeks Judithann Graves Nyoka Cowden, MD [facility]  ?  ? ?  ?  ?  ?? furosemide (LASIX) 20 MG tablet [Pharmacy Med Name: FUROSEMIDE 20 MG TAB] 30 tablet 5  ?  Sig: TAKE 1 TABLET BY MOUTH DAILY AS NEEDED  ?  ? Cardiovascular:  Diuretics - Loop Failed - 12/23/2021  3:36 PM  ?  ?  Failed - Cr in normal range and within 180 days  ?  Creatinine  ?Date Value Ref Range Status  ?10/10/2014 0.79 0.60 - 1.30 mg/dL Final  ? ?Creatinine, Ser  ?Date Value Ref Range Status  ?11/12/2021 1.07 (H) 0.44 - 1.00 mg/dL Final  ?   ?  ?  Failed - Mg Level in normal range and within 180 days  ?  Magnesium  ?Date Value Ref Range Status  ?07/25/2016 2.0 1.7 - 2.4 mg/dL Final  ?   ?  ?  Passed - K in normal range and within 180 days  ?  Potassium  ?Date Value Ref Range Status  ?11/19/2021 3.6 3.5 - 5.1 mmol/L Final  ?10/10/2014 3.8 3.5 - 5.1 mmol/L Final  ?   ?  ?  Passed - Ca in normal range and within 180 days  ?  Calcium  ?Date Value Ref Range Status  ?11/12/2021 8.6 (L) 8.9 - 10.3 mg/dL Final   ? ?Calcium, Total  ?Date Value Ref Range Status  ?10/10/2014 8.8 8.5 - 10.1 mg/dL Final  ? ?Calcium, Ion  ?Date Value Ref Range Status  ?11/19/2021 1.21 1.15 - 1.40 mmol/L Final  ?   ?  ?  Passed - Na in normal range and within 180 days  ?  Sodium  ?Date Value Ref Range Status  ?11/19/2021 144 135 - 145 mmol/L Final  ?10/21/2021 143 134 - 144 mmol/L Final  ?10/10/2014 143 136 - 145 mmol/L Final  ?   ?  ?  Passed - Cl in normal range and within 180 days  ?  Chloride  ?Date Value Ref Range Status  ?11/12/2021 111 98 - 111 mmol/L Final  ?10/10/2014 106 98 - 107 mmol/L Final  ?   ?  ?  Passed - Last BP in normal range  ?  BP Readings from Last 1 Encounters:  ?12/03/21 96/64  ?   ?  ?  Passed - Valid encounter within last 6 months  ?  Recent Outpatient Visits   ?      ? 1 month ago Hoarseness of voice  ? [facility] Reubin Milan, MD  ? 2 months ago Prediabetes  ? [facility] Reubin Milan, MD  ? 5 months ago Annual physical exam  ? [facility] Reubin Milan, MD  ? 8 months ago Hypothyroidism, unspecified type  ? [facility] Reubin Milan, MD  ? 11 months ago Acute pain of right knee  ? [facility] Reubin Milan, MD  ?  ?  ?Future Appointments   ?        ? In 3 weeks Luciano Cutter, MD [facility]  ? In 3 weeks Reubin Milan, MD [facility]  ?  ? ?  ?  ?  ? ?

## 2021-12-24 NOTE — Telephone Encounter (Signed)
Requested medication (s) are due for refill today: yes ? ?Requested medication (s) are on the active medication list: yes ? ?Last refill:  flexeril- 10/21/21 #30 1 refill, lasix- 01/08/21 # 30 5 refills ? ?Future visit scheduled: yes in 3 weeks  ? ?Notes to clinic:  not delegated per protocol, unable to refill per protocol for Magnesium last lab 07/25/2016 for lasix. Do you want to refill Rx? ? ? ?  ?Requested Prescriptions  ?Pending Prescriptions Disp Refills  ? cyclobenzaprine (FLEXERIL) 10 MG tablet [Pharmacy Med Name: CYCLOBENZAPRINE HCL 10 MG TAB] 30 tablet 1  ?  Sig: TAKE ONE TABLET BY MOUTH 3 TIMES DAILY AS NEEDED FOR MUSCLE SPASM  ?  ? Not Delegated - Analgesics:  Muscle Relaxants Failed - 12/23/2021  3:36 PM  ?  ?  Failed - This refill cannot be delegated  ?  ?  Passed - Valid encounter within last 6 months  ?  Recent Outpatient Visits   ? ?      ? 1 month ago [facility]  [facility] MilanBerglund, Laura H, MD  ? 2 months ago Prediabetes  ? [facility] MilanBerglund, Laura H, MD  ? 5 months ago Annual physical exam  ? [facility] Milan[facility]erglund, Laura H, MD  ? 8 months ago Hypothyroidism, unspecified type  ? [facility] Milan[facility]erg[facility], Laura H, MD  ? 11 months ago Acute pain of right knee  ? [facility]ubin MilanBerglund, Laura H, MD  ? ?  ?  ?Future Appointments   ? ?        ? In 3 weeks Luciano CutterEllison, Chi Jane, MD [facility]  ? In 3 weeks Judithann GravesBerglund, Nyoka CowdenLaura H, [facility]  ? ?  ? ?  ?  ?  ? furosemide (LASIX) 20 MG tablet [Pharmacy Med Name: FUROSEMIDE 20 MG TAB] 30 tablet 5  ?  Sig: TAKE 1 TABLET BY MOUTH DAILY AS NEEDED  ?  ? Cardiovascular:  Diuretics - Loop Failed - 12/23/2021  3:36 PM  ?  ?  Failed - Cr in normal range and within 180 days  ?  Creatinine  ?Date Value Ref Range Status  ?10/10/2014 0.79 0.60 - 1.30 mg/dL Final  ? ?Creatinine, Ser  ?Date Value Ref Range Status  ?11/12/2021 1.07 (H) 0.44 - 1.00 mg/dL Final  ?  ?  ?  ?  Failed - Mg Level in normal  range and within 180 days  ?  Magnesium  ?Date Value Ref Range Status  ?07/25/2016 2.0 1.7 - 2.4 mg/dL Final  ?  ?  ?  ?  Passed - K in normal range and within 180 days  ?  Potassium  ?Date Value Ref Range Status  ?11/19/2021 3.6 3.5 - 5.1 mmol/L Final  ?10/10/2014 3.8 3.5 - 5.1 mmol/L Final  ?  ?  ?  ?  Passed - Ca in normal range and within 180 days  ?  Calcium  ?Date Value Ref Range Status  ?11/12/2021 8.6 (L) 8.9 - 10.3 mg/dL Final  ? ?Calcium, Total  ?Date Value Ref Range Status  ?10/10/2014 8.8 8.5 - 10.1 mg/dL Final  ? ?Calcium, Ion  ?Date Value Ref Range Status  ?11/19/2021 1.21 1.15 - 1.40 mmol/L Final  ?  ?  ?  ?  Passed - Na in normal range and within 180 days  ?  Sodium  ?Date Value Ref Range Status  ?11/19/2021 144 135 - 145  mmol/L Final  ?10/21/2021 143 134 - 144 mmol/L Final  ?10/10/2014 143 136 - 145 mmol/L Final  ?  ?  ?  ?  Passed - Cl in normal range and within 180 days  ?  Chloride  ?Date Value Ref Range Status  ?11/12/2021 111 98 - 111 mmol/L Final  ?10/10/2014 106 98 - 107 mmol/L Final  ?  ?  ?  ?  Passed - Last BP in normal range  ?  BP Readings from Last 1 Encounters:  ?12/03/21 96/64  ?  ?  ?  ?  Passed - Valid encounter within last 6 months  ?  Recent Outpatient Visits   ? ?      ? 1 month ago Hoarseness of voice  ? [facility] Reubin Milan, MD  ? 2 months ago Prediabetes  ? [facility] Reubin Milan, MD  ? 5 months ago Annual physical exam  ? [facility] Reubin Milan, MD  ? 8 months ago Hypothyroidism, unspecified type  ? [facility] Reubin Milan, MD  ? 11 months ago Acute pain of right knee  ? [facility] Reubin Milan, MD  ? ?  ?  ?Future Appointments   ? ?        ? In 3 weeks Luciano Cutter, MD [facility]  ? In 3 weeks Reubin Milan, MD [facility]  ? ?  ? ?  ?  ?  ?Signed Prescriptions Disp Refills  ? rosuvastatin (CRESTOR) 5 MG tablet 90 tablet 0  ?  Sig: TAKE 1 TABLET BY MOUTH  DAILY  ?  ? Cardiovascular:  Antilipid - Statins 2 Failed - 12/23/2021  3:36 PM  ?  ?  Failed - Cr in normal range and within 360 days  ?  Creatinine  ?Date Value Ref Range Status  ?10/10/2014 0.79 0.60 - 1.30 mg/dL Final  ? ?Creatinine, Ser  ?Date Value Ref Range Status  ?11/12/2021 1.07 (H) 0.44 - 1.00 mg/dL Final  ?  ?  ?  ?  Failed - Lipid Panel in normal range within the last 12 months  ?  Cholesterol, Total  ?Date Value Ref Range Status  ?10/21/2021 333 (H) 100 - 199 mg/dL Final  ? ?Cholesterol  ?Date Value Ref Range Status  ?05/17/2014 157 0 - 200 mg/dL Final  ? ?Ldl Cholesterol, Calc  ?Date Value Ref Range Status  ?05/17/2014 74 0 - 100 mg/dL Final  ? ?LDL Chol Calc (NIH)  ?Date Value Ref Range Status  ?10/21/2021 238 (H) 0 - 99 mg/dL Final  ? ?HDL Cholesterol  ?Date Value Ref Range Status  ?05/17/2014 29 (L) 40 - 60 mg/dL Final  ? ?HDL  ?Date Value Ref Range Status  ?10/21/2021 58 >39 mg/dL Final  ? ?Triglycerides  ?Date Value Ref Range Status  ?10/21/2021 191 (H) 0 - 149 mg/dL Final  ?74/05/1447 185 (H) 0 - 200 mg/dL Final  ? ?  ?  ?  Passed - Patient is not pregnant  ?  ?  Passed - Valid encounter within last 12 months  ?  Recent Outpatient Visits   ? ?      ? 1 month ago Hoarseness of voice  ? [facility] Reubin Milan, MD  ? 2 months ago Prediabetes  ? [facility] Reubin Milan, MD  ? 5 months ago Annual physical exam  ? [facility],  Nyoka Cowden, MD  ? 8 months ago Hypothyroidism, unspecified type  ? [facility] Reubin Milan, MD  ? 11 months ago Acute pain of right knee  ? [facility], [facility]  ? ?  ?  ?Future Appointments   ? ?        ? In 3 weeks Luciano Cutter, MD [facility]  ? In 3 weeks Reubin Milan, MD [facility]  ? ?  ? ?  ?  ?  ? ?

## 2021-12-25 DIAGNOSIS — D8685 Sarcoid myocarditis: Secondary | ICD-10-CM | POA: Diagnosis not present

## 2021-12-25 DIAGNOSIS — D860 Sarcoidosis of lung: Secondary | ICD-10-CM | POA: Diagnosis not present

## 2021-12-25 DIAGNOSIS — G4733 Obstructive sleep apnea (adult) (pediatric): Secondary | ICD-10-CM | POA: Diagnosis not present

## 2021-12-25 DIAGNOSIS — E785 Hyperlipidemia, unspecified: Secondary | ICD-10-CM | POA: Diagnosis not present

## 2021-12-25 DIAGNOSIS — E119 Type 2 diabetes mellitus without complications: Secondary | ICD-10-CM | POA: Diagnosis not present

## 2021-12-26 NOTE — Telephone Encounter (Signed)
This was cancelled.  Nothing further needed at this time.  ?

## 2021-12-31 ENCOUNTER — Telehealth: Payer: Self-pay

## 2021-12-31 NOTE — Telephone Encounter (Signed)
-----   Message from Montel Culver, CPhT sent at 12/31/2021 10:28 AM EDT ----- ?Regarding: RE: Medication Assistance ?Assunta Found, ?   Just FYI Ms. Kochan never sent her applications in. I have spoke to her several times and she keeps telling me she will send it in, and yet we have not received it. I am going to close her out for now, and if she sends it in I will reopen her protocol.  Just thought I would let you guys know. ? ?Thanks for your help ?[name] ?----- Message ----- ?From: Amantha Sklar, Eulis Canner, CMA ?Sent: 11/28/2021   3:43 PM EDT ?To: Montel Culver, CPhT ?Subject: RE: Medication Assistance                     ? ?Hey,  ? ?I spoke to the patients husband he stated that they are only waiting on a copy of there taxes for this year. He stated that they have all the other information that's needed.  ? ?KP ?----- Message ----- ?From: Montel Culver, CPhT ?Sent: 11/27/2021   3:22 PM EST ?To: Harlon Flor, [facility], Eulis Canner Darcy Cordner, CMA ?Subject: Medication Assistance                         ? ?Assunta Found ?  I am trying to get Ms. McIntrye to get her assistance applications in for [employer].  I've spoke with her twice over the last 6 weeks, she has told me several times she was sending them in and I have not received them.  I tried to call her today and had to leave a message.  Do you have any ideas on how to get in touch with her or what we can do to get her to send them in?  I know she called you at the beginning of January asking about them, and I confirmed with her on the 6th that's she received them.  Thanks for any help you can give. ? ?Tresea Mall  ?Certified Pharmacy Technician ?[place]  ?[address]  ?[place], [place] [address]   ?Office: [phone] ?Mobile: [phone] ?Fax: [phone] ?Email: [name] ? ? ? ? ?

## 2021-12-31 NOTE — Telephone Encounter (Signed)
Called pt left VM stating that [name] from patient assistance reached out to let us know that she is closing out the patients case. Due to the information that was requested for the application not sent in. Pt name was stated on VM.  ? ?KP ?

## 2022-01-01 ENCOUNTER — Telehealth: Payer: Self-pay | Admitting: Pulmonary Disease

## 2022-01-01 NOTE — Telephone Encounter (Signed)
Reviewed PET/CT from [facility] on 12/25/21: ?No evidence of cardiac sarcoid or FDG avid sarcoid on PET ? ?Called and updated patient on results. She expressed appreciation for the call. We will continue discussion at our next appointment in April. ?

## 2022-01-02 DIAGNOSIS — E119 Type 2 diabetes mellitus without complications: Secondary | ICD-10-CM | POA: Diagnosis not present

## 2022-01-02 DIAGNOSIS — Z961 Presence of intraocular lens: Secondary | ICD-10-CM | POA: Diagnosis not present

## 2022-01-02 DIAGNOSIS — H4710 Unspecified papilledema: Secondary | ICD-10-CM | POA: Diagnosis not present

## 2022-01-07 LAB — HM HEPATITIS C SCREENING LAB: HM Hepatitis Screen: NEGATIVE

## 2022-01-14 ENCOUNTER — Telehealth: Payer: Self-pay

## 2022-01-14 NOTE — Telephone Encounter (Signed)
Copied from CRM [id number]. Topic: General - Other ?>> Jan 14, 2022  1:41 PM Marylen Ponto wrote: ?Reason for CRM: Pt stated she received a message that her case will be closed so she would like someone to call her back to advise why her case will be closed when she sent in the information that was requested. Cb# [phone] ?

## 2022-01-14 NOTE — Telephone Encounter (Signed)
Called pt let her know she needed to call Tresea Mall. Gave pt the number to call. Pt verbalized understanding. ? ?KP ?

## 2022-01-15 ENCOUNTER — Other Ambulatory Visit: Payer: Self-pay

## 2022-01-15 NOTE — Telephone Encounter (Signed)
Spoke to pt told her to call Jennifer Bright at [phone]. To reopen her case. She verbalized understanding. ? ?KP ?

## 2022-01-15 NOTE — Telephone Encounter (Signed)
Patient called in asking to speak to Person, Jennifer Bright say that its very important that she speak to her about an Rx that she does not want to be cancelled. Please call Ph# [phone] ?

## 2022-01-19 ENCOUNTER — Ambulatory Visit: Payer: Medicare Other | Admitting: Pulmonary Disease

## 2022-01-20 ENCOUNTER — Ambulatory Visit: Payer: Medicare Other | Admitting: Internal Medicine

## 2022-01-28 ENCOUNTER — Telehealth: Payer: Self-pay

## 2022-01-28 NOTE — Telephone Encounter (Signed)
Received fax requesting more information on [employer] application submitted. We have not submitted anything, called to ask pt if she knows anything about this, no answer LVMTRC. ?

## 2022-01-30 ENCOUNTER — Telehealth: Payer: Self-pay

## 2022-01-30 NOTE — Telephone Encounter (Signed)
Pt is requesting referral for Mammo at [facility]. ?

## 2022-01-30 NOTE — Telephone Encounter (Signed)
Pt called back, she did start an application for Premarin vag cream because it is $100+ for her. Advised we will start on it as soon as we can. ?

## 2022-02-02 ENCOUNTER — Other Ambulatory Visit: Payer: Self-pay | Admitting: Internal Medicine

## 2022-02-02 ENCOUNTER — Ambulatory Visit (INDEPENDENT_AMBULATORY_CARE_PROVIDER_SITE_OTHER): Payer: Medicare Other | Admitting: Internal Medicine

## 2022-02-02 ENCOUNTER — Encounter: Payer: Self-pay | Admitting: Internal Medicine

## 2022-02-02 VITALS — BP 100/68 | HR 116 | Ht 64.0 in | Wt 174.0 lb

## 2022-02-02 DIAGNOSIS — R569 Unspecified convulsions: Secondary | ICD-10-CM | POA: Diagnosis not present

## 2022-02-02 DIAGNOSIS — E782 Mixed hyperlipidemia: Secondary | ICD-10-CM

## 2022-02-02 DIAGNOSIS — F333 Major depressive disorder, recurrent, severe with psychotic symptoms: Secondary | ICD-10-CM | POA: Diagnosis not present

## 2022-02-02 DIAGNOSIS — D860 Sarcoidosis of lung: Secondary | ICD-10-CM | POA: Diagnosis not present

## 2022-02-02 DIAGNOSIS — R7303 Prediabetes: Secondary | ICD-10-CM | POA: Diagnosis not present

## 2022-02-02 MED ORDER — PRIMIDONE 50 MG PO TABS: 100.0000 mg | ORAL_TABLET | Freq: Three times a day (TID) | ORAL | 0 refills | Status: AC

## 2022-02-02 NOTE — Progress Notes (Signed)
? ? ?Date:  02/02/2022  ? ?Name:  Jennifer Bright   DOB:  [birth year]   MRN:  [id number] ? ? ?Chief Complaint: Hyperlipidemia ? ?Hyperlipidemia ?This is a chronic problem. The problem is uncontrolled. Pertinent negatives include no chest pain or shortness of breath. Current antihyperlipidemic treatment includes statins (crestor started last visit). There are no compliance problems.   ?Depression ?       This is a chronic (followed by Psych) problem.  The problem occurs constantly.  The problem has been gradually worsening since onset.  Associated symptoms include decreased concentration, fatigue, helplessness, hopelessness, irritable, restlessness, decreased interest, headaches, sad and suicidal ideas.  Associated symptoms include no appetite change.     The symptoms are aggravated by family issues (step father passed away and mother has terminal lung cancer).  Treatments tried: Cymbalta, Gabapentin, Trazodone, adderall and Xanax. ?Recent cardiac cath was normal. ?Tremor - she is on Primadone but the tremor is worsening due to her recent family issues.  She is wondering if she could get a higher dose.  She is becoming disabled to the degree that she is looking into LTC facilities. ?Suprapubic mass - pt reports seeing a finding on a recent scan that suggested a walnut sized mass.  I can not find a scan that reveals this and she was unable to bring up on her phone. ? ?Lab Results  ?Component Value Date  ? NA 144 11/19/2021  ? K 3.6 11/19/2021  ? CO2 19 (L) 11/12/2021  ? GLUCOSE 202 (H) 11/12/2021  ? BUN 17 11/12/2021  ? CREATININE 1.07 (H) 11/12/2021  ? CALCIUM 8.6 (L) 11/12/2021  ? EGFR 53 (L) 10/21/2021  ? GFRNONAA >60 11/12/2021  ? ?Lab Results  ?Component Value Date  ? CHOL 333 (H) 10/21/2021  ? HDL 58 10/21/2021  ? LDLCALC 238 (H) 10/21/2021  ? TRIG 191 (H) 10/21/2021  ? CHOLHDL 5.7 (H) 10/21/2021  ? ?Lab Results  ?Component Value Date  ? TSH 2.710 10/21/2021  ? ?Lab Results  ?Component Value Date  ? HGBA1C 5.9  (H) 10/21/2021  ? ?Lab Results  ?Component Value Date  ? WBC 6.9 11/12/2021  ? HGB 11.6 (L) 11/19/2021  ? HCT 34.0 (L) 11/19/2021  ? MCV 90.1 11/12/2021  ? PLT 235 11/12/2021  ? ?Lab Results  ?Component Value Date  ? ALT 20 10/21/2021  ? AST 16 10/21/2021  ? ALKPHOS 61 10/21/2021  ? BILITOT 0.3 10/21/2021  ? ?Lab Results  ?Component Value Date  ? VD25OH 30.25 05/27/2021  ?  ? ?Review of Systems  ?Constitutional:  Positive for fatigue. Negative for appetite change, chills, fever and unexpected weight change.  ?Respiratory:  Negative for cough, chest tightness and shortness of breath.   ?Cardiovascular:  Positive for leg swelling. Negative for chest pain.  ?Gastrointestinal:  Negative for abdominal pain (She has seen a scan done by another provider; she says there is a suprapubic walnut sized mass), constipation and diarrhea.  ?Genitourinary:  Negative for hematuria, pelvic pain and vaginal bleeding.  ?Musculoskeletal:  Positive for arthralgias, gait problem and joint swelling.  ?Neurological:  Positive for tremors, syncope, weakness, light-headedness and headaches. Negative for dizziness.  ?Psychiatric/Behavioral:  Positive for decreased concentration, depression and suicidal ideas.   ? ?Patient Active Problem List  ? Diagnosis Date Noted  ? Restrictive lung disease 07/18/2021  ? Shortness of breath 06/29/2021  ? Lumbar radiculopathy 05/27/2021  ? Gastritis without bleeding   ? S/P total abdominal hysterectomy and bilateral salpingo-oophorectomy 08/14/2020  ?  Chronic idiopathic constipation 08/14/2020  ? Depression, major, recurrent, severe with psychosis ([place]) 06/26/2020  ? Seizure-like activity ([place]) 06/04/2020  ? Urinary incontinence without sensory awareness 05/21/2020  ? Bipolar 2 disorder, major depressive episode ([place]) 09/19/2019  ? Stage 3a chronic kidney disease ([place]) 07/10/2019  ? Proteinuria 07/10/2019  ? Myalgia 12/30/2017  ? Vaginal atrophy 07/12/2017  ? Polypharmacy 08/14/2016  ? Sarcoidosis 08/14/2016   ? Hypercalcemia 07/24/2016  ? Mixed hyperlipidemia 11/15/2015  ? Prediabetes 11/15/2015  ? Anxiety disorder 07/29/2015  ? Benign essential tremor 05/12/2015  ? GERD with esophagitis 05/12/2015  ? Hypersomnia with sleep apnea 05/12/2015  ? Restless leg 05/12/2015  ? Acquired hypothyroidism 05/12/2015  ? Chronic migraine without aura 09/03/2012  ? 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 ? ?Allergies  ?Allergen Reactions  ? Cleocin [Clindamycin Hcl] Other (See Comments)  ?  GI distress  ? ? ?Past Surgical History:  ?Procedure Laterality Date  ? APPENDECTOMY    ? BLADDER SURGERY    ? bladder tact  ? CATARACT EXTRACTION W/PHACO Right 09/21/2018  ? Procedure: CATARACT EXTRACTION PHACO AND INTRAOCULAR LENS PLACEMENT (IOC);  Surgeon: [name], MD;  Location: [facility];  Service: Ophthalmology;  Laterality: Right;  [place]  00:20 ?CDE 00.82 ?Fluid pack lot # [id number]  ? CATARACT EXTRACTION W/PHACO Left 10/17/2018  ? Procedure: CATARACT EXTRACTION PHACO AND INTRAOCULAR LENS PLACEMENT ([place])  LEFT;  Surgeon: [name], MD;  Location: [place];  Service: Ophthalmology;  Laterality: Left;  diabetic - diet controlled  ? CESAREAN SECTION    ? CHOLECYSTECTOMY    ? COLONOSCOPY WITH PROPOFOL N/A 04/05/2017  ? Procedure: COLONOSCOPY WITH PROPOFOL;  Surgeon: [name], MD;  Location[facility] ENDOSCOPY;  Service: Endoscopy;  Laterality: N/A;  ? ESOPHAGOGASTRODUODENOSCOPY (EGD) WITH PROPOFOL N/A 08/28/2016  ? Procedure: ESOPHAGOGASTRODUODENOSCOPY (EGD) WITH PROPOFOL;  Surgeon: [name], MD;  Location: [facility] ENDOSCOPY;  Service: Endoscopy;  Laterality: N/A;  ? ESOPHAGOGASTRODUODENOSCOPY (EGD) WITH PROPOFOL N/A 11/08/2020  ? Procedure: ESOPHAGOGASTRODUODENOSCOPY (EGD) WITH PROPOFOL w// biopsy;  Surgeon: [name], MD;  Location: [place];  Service: Endoscopy;  Laterality: N/A;  Pre-Diabetic  ? HERNIA REPAIR    ? INGUINAL HERNIA REPAIR    ? rt and left  ? LAPAROSCOPIC SUPRACERVICAL  HYSTERECTOMY  2004  ? lexiscan cardiolite    ? RIGHT/LEFT HEART CATH AND CORONARY ANGIOGRAPHY N/A 11/19/2021  ? Procedure: RIGHT/LEFT HEART CATH AND CORONARY ANGIOGRAPHY;  Surgeon: Jolaine Artist, MD;  Location: [place] CV LAB;  Service: Cardiovascular;  Laterality: N/A;  ? SALPINGOOPHORECTOMY Bilateral   ? RSO 2007 with adhesions, LSO 2008 with Dr. Bary Castilla  ? TILT TABLE STUDY N/A 04/01/2012  ? Procedure: TILT TABLE STUDY;  Surgeon: Deboraha Sprang, MD;  Location[facility] CATH LAB;  Service: Cardiovascular;  Laterality: N/A;  ? ? ?Social History  ? ?Tobacco Use  ? Smoking status: Former  ?  Types: Cigarettes  ?  Start date: 2011  ?  Quit date: 2018  ?  Years since quitting: 5.2  ? Smokeless tobacco: Never  ?Vaping Use  ? Vaping Use: Never used  ?Substance Use Topics  ? Alcohol use: No  ? Drug use: No  ? ? ? ?Medication list has been reviewed and updated. ? ?Current Meds  ?Medication Sig  ? ALPRAZolam (XANAX XR) 1 MG 24 hr tablet Take 1 mg by mouth daily. Dr. Toy Care  ? ALPRAZolam (XANAX) 1 MG tablet Take 1 mg by mouth 3 (three) times daily as  needed for anxiety.  ? amphetamine-dextroamphetamine (ADDERALL) 30 MG tablet Take 30 mg by mouth daily.  ? benztropine (COGENTIN) 1 MG tablet Take 1 mg by mouth 2 (two) times daily. Dr. Toy Care  ? Cholecalciferol (VITAMIN D3) 50 MCG (2000 UT) capsule Take 2,000 Units by mouth daily.  ? conjugated estrogens (PREMARIN) vaginal cream Insert 1 g vaginally nightly for 7 nights then once weekly as maintenance  ? cyclobenzaprine (FLEXERIL) 10 MG tablet TAKE ONE TABLET BY MOUTH 3 TIMES DAILY AS NEEDED FOR MUSCLE SPASM  ? docusate sodium (COLACE) 100 MG capsule Take 100 mg by mouth 2 (two) times daily.  ? DULoxetine (CYMBALTA) 60 MG capsule Take 120 mg by mouth daily. Dr. Toy Care  ? furosemide (LASIX) 20 MG tablet TAKE 1 TABLET BY MOUTH DAILY AS NEEDED  ? gabapentin (NEURONTIN) 300 MG capsule Take 300 mg by mouth 2 (two) times daily.  ? hydrOXYzine (VISTARIL) 25 MG capsule Take 25 mg by mouth  3 (three) times daily as needed for anxiety.  ? levothyroxine (SYNTHROID) 100 MCG tablet Take 1 tablet (100 mcg total) by mouth daily before breakfast.  ? Melatonin 10 MG TABS Take 10 mg by mouth at bedtim

## 2022-02-02 NOTE — Telephone Encounter (Signed)
Pt aware. Says she will try. Pt needs number of place where she has been lately to get this done, couldn't wait for me to look up infor. She will call back [phone]. ?

## 2022-02-02 NOTE — Telephone Encounter (Signed)
Has pt tried to call to schedule herself? I didn't think [facility] needed order. If they do, then we'll send it.

## 2022-02-02 NOTE — Telephone Encounter (Signed)
Resending to mail order pharmacy ?Requested Prescriptions  ?Pending Prescriptions Disp Refills  ?? primidone (MYSOLINE) 50 MG tablet [Pharmacy Med Name: Primidone 50 MG Oral Tablet] 360 tablet 3  ?  Sig: TAKE 1 TABLET BY MOUTH TWICE  DAILY AT 10 AM &amp; 5 PM AND 2  TABLETS AT BEDTIME  ?  ? Neurology:  Anticonvulsants - primidone Failed - 02/02/2022  7:11 AM  ?  ?  Failed - HCT in normal range and within 360 days  ?  HCT  ?Date Value Ref Range Status  ?11/19/2021 34.0 (L) 36.0 - 46.0 % Final  ? ?Hematocrit  ?Date Value Ref Range Status  ?10/21/2021 39.1 34.0 - 46.6 % Final  ?   ?  ?  Failed - HGB in normal range and within 360 days  ?  Hemoglobin  ?Date Value Ref Range Status  ?11/19/2021 11.6 (L) 12.0 - 15.0 g/dL Final  ?26/94/8546 27.0 11.1 - 15.9 g/dL Final  ?   ?  ?  Failed - Cr in normal range and within 360 days  ?  Creatinine  ?Date Value Ref Range Status  ?10/10/2014 0.79 0.60 - 1.30 mg/dL Final  ? ?Creatinine, Ser  ?Date Value Ref Range Status  ?11/12/2021 1.07 (H) 0.44 - 1.00 mg/dL Final  ?   ?  ?  Failed - Primidone (Serum) in normal range and within 360 days  ?  No results found for: PRIMIDONE   ?  ?  Passed - PLT in normal range and within 360 days  ?  Platelets  ?Date Value Ref Range Status  ?11/12/2021 235 150 - 400 K/uL Final  ?10/21/2021 200 150 - 450 x10E3/uL Final  ?   ?  ?  Passed - WBC in normal range and within 360 days  ?  WBC  ?Date Value Ref Range Status  ?11/12/2021 6.9 4.0 - 10.5 K/uL Final  ?   ?  ?  Passed - AST in normal range and within 360 days  ?  AST  ?Date Value Ref Range Status  ?10/21/2021 16 0 - 40 IU/L Final  ? ?SGOT(AST)  ?Date Value Ref Range Status  ?10/10/2014 12 (L) 15 - 37 Unit/L Final  ?   ?  ?  Passed - ALT in normal range and within 360 days  ?  ALT  ?Date Value Ref Range Status  ?10/21/2021 20 0 - 32 IU/L Final  ? ?SGPT (ALT)  ?Date Value Ref Range Status  ?10/10/2014 38 U/L Final  ?  Comment:  ?  14-63 ?NOTE: New Reference Range ?05/08/14 ?  ?   ?  ?  Passed -  Completed PHQ-2 or PHQ-9 in the last 360 days  ?  ?  Passed - Valid encounter within last 12 months  ?  Recent Outpatient Visits   ?      ? Today Mixed hyperlipidemia  ? [facility] Reubin Milan, MD  ? 3 months ago Hoarseness of voice  ? [facility] Reubin Milan, MD  ? 3 months ago Prediabetes  ? [facility] Reubin Milan, MD  ? 7 months ago Annual physical exam  ? [facility] Reubin Milan, MD  ? 10 months ago Hypothyroidism, unspecified type  ? [facility] Reubin Milan, MD  ?  ?  ?Future Appointments   ?        ? In 2 days Luciano Cutter, MD [facility]  ?  ? ?  ?  ?  ? ? ?

## 2022-02-04 ENCOUNTER — Encounter: Payer: Self-pay | Admitting: Pulmonary Disease

## 2022-02-04 ENCOUNTER — Ambulatory Visit: Payer: Medicare Other | Admitting: Pulmonary Disease

## 2022-02-04 VITALS — BP 130/70 | HR 111 | Ht 64.0 in | Wt 173.4 lb

## 2022-02-04 DIAGNOSIS — D869 Sarcoidosis, unspecified: Secondary | ICD-10-CM | POA: Diagnosis not present

## 2022-02-04 LAB — COMPREHENSIVE METABOLIC PANEL
ALT: 17 IU/L (ref 0–32)
AST: 16 IU/L (ref 0–40)
Albumin/Globulin Ratio: 2 (ref 1.2–2.2)
Albumin: 4.4 g/dL (ref 3.8–4.9)
Alkaline Phosphatase: 64 IU/L (ref 44–121)
BUN/Creatinine Ratio: 13 (ref 9–23)
BUN: 14 mg/dL (ref 6–24)
Bilirubin Total: 0.3 mg/dL (ref 0.0–1.2)
CO2: 18 mmol/L — ABNORMAL LOW (ref 20–29)
Calcium: 9.6 mg/dL (ref 8.7–10.2)
Chloride: 105 mmol/L (ref 96–106)
Creatinine, Ser: 1.07 mg/dL — ABNORMAL HIGH (ref 0.57–1.00)
Globulin, Total: 2.2 g/dL (ref 1.5–4.5)
Glucose: 172 mg/dL — ABNORMAL HIGH (ref 70–99)
Potassium: 3.9 mmol/L (ref 3.5–5.2)
Sodium: 141 mmol/L (ref 134–144)
Total Protein: 6.6 g/dL (ref 6.0–8.5)
eGFR: 62 mL/min/{1.73_m2} (ref >59)

## 2022-02-04 LAB — LIPID PANEL
Chol/HDL Ratio: 4.9 ratio — ABNORMAL HIGH (ref 0.0–4.4)
Cholesterol, Total: 205 mg/dL — ABNORMAL HIGH (ref 100–199)
HDL: 42 mg/dL (ref >39)
LDL Chol Calc (NIH): 115 mg/dL — ABNORMAL HIGH (ref 0–99)
Triglycerides: 278 mg/dL — ABNORMAL HIGH (ref 0–149)
VLDL Cholesterol Cal: 48 mg/dL — ABNORMAL HIGH (ref 5–40)

## 2022-02-04 LAB — PRIMIDONE, SERUM
Phenobarbital, Serum: 4 ug/mL — ABNORMAL LOW (ref 15–40)
Primidone Lvl: 4.7 ug/mL — ABNORMAL LOW (ref 5.0–12.0)

## 2022-02-04 LAB — SPECIMEN STATUS REPORT

## 2022-02-04 LAB — HEMOGLOBIN A1C
Est. average glucose Bld gHb Est-mCnc: 146 mg/dL
Hgb A1c MFr Bld: 6.7 % — ABNORMAL HIGH (ref 4.8–5.6)

## 2022-02-04 NOTE — Patient Instructions (Signed)
Pulmonary Sarcoid ?--Good new! No active sarcoid on your PET scan and normal cardiac cath ?--Encourage regular aerobic exercise 30 minutes for 5 days a week ? ? ?Follow-up with me in 4 months ?

## 2022-02-04 NOTE — Progress Notes (Signed)
? ? ?Subjective:  ? ?PATIENT ID: Jennifer Bright: 1966/07/20, MRN: [id number] ? ? ?HPI ? ?Chief Complaint  ?Patient presents with  ? Follow-up  ?  Cardio cath results ?Very tired and depressed due to step father passing  ? ?Reason for Visit: Follow-up for sarcoidosis ? ?[name] is a 56 year old female with history of sarcoidosis who presents for follow-up. Husband present. ? ?Synopsis: ?She was diagnosed with sarcoidosis in 2015. She had initially presented to the [facility] ED for kidney stones and chest imaging concerning for lung nodules. She underwent bronchoscopy and which confirmed findings consistent with sarcoid. She was previously seen in [place] in Pulmonary clinic with Dr. Raul Del. Her last PFT was two years ago and CXR 1 year ago. She had bilateral cataract surgery in 2019 and last eye exam in Oct/Nov 2021.  ? ?07/18/21 ?While undergoing PFTs, she had a syncopal episode. EKG with unchanged incomplete RBBB. EMS was called to the office. She declined EMS as she is currently being worked up at [facility]. She wished to be seen in clinic for her sarcoid follow-up. ? ?She reports that her chronic medications lead her to have suicidal ideation and seeing a psychiatrist, last seen in the two weeks. Prior to this visit, she has been working on being more active. She is walking one mile daily at a steady/brisk pace. Denies shortness of breath, cough or wheezing. Only requires 1-2 breaks for a few minutes which is better compared to six weeks ago. She does have shortness of breath and chest pain which is triggered by stress and anxiety. Does require lasix for lower extremity edema ? ?09/17/21 ?She is present with her husband. She reports her shortness of breath has worsened in the last few weeks. She has been recently stressed out with family (mom hospitalized) and this will trigger her dyspnea. She is active by walking daily but continues to have fatigue and sleepiness.  Sleep test in 06/2021 was negative. Still has non-restorative sleep. Previously on psych meds however self-discontinued. Currently not seeing psychiatry. She reports hearing voices will keep her awake. Denies fevers, chills, chest pain/discomfort. ? ?11/03/21 ?Husband is present to provide additional history on video call. She reports she is still having neck pain and having associated depression. Planning for injections for neck pain. No difference of her respiratory symptoms on the steroids prescribed on last visit. She has gained 4 lbs since our last visit 6.5 weeks ago when she took short taper of steroids. Denies chest pain, wheezing or cough. ? ?02/04/22 ?Since our last visit she underwent cardiac cath and normal. She was seen by Ophthalmology and told she has no active sarcoid. Note reviewed from 01/02/22. She continues to have shortness of breath but reports she is walking 45 minutes every day, 1-2 times a day. Some days she will pass out. Her rose garden is blooming now and pleased with it.  ? ?She expresses frustration with the healthcare system and how having multiple physician opinions is overwhelming and contradicting at times. ? ?Social History: ?Quit in 2018. Smoked 1 ppd x 4 years. ? ?Environmental exposures:  ?Retail ? ?Past Medical History:  ?Diagnosis Date  ? Acid reflux   ? Anemia   ? Anxiety   ? Bipolar 1 disorder, manic, moderate ([place]) 09/19/2019  ? Bipolar depression ([place]) 05/12/2015  ? Bipolar disorder ([place])   ? Bipolar disorder ([place]) 05/12/2015  ? Bleach ingestion 06/27/2020  ? Cataract   ? Chronic kidney disease   ?  STAGE 3  ? Complication of anesthesia   ? Felt cataract procedures  ? Depression   ? Diabetes mellitus   ? NIDD  ? Essential tremor   ? Family history of adverse reaction to anesthesia   ? Mother - slow to wake  ? Family history of breast cancer   ? doesn't meet medicare guidelines for cancer genetic testing.   ? Family history of ovarian cancer   ? Pt doesn't meet Medicare genetic  testing guidelines  ? Hyperlipidemia   ? Hypothyroid   ? IBS (irritable bowel syndrome)   ? Injury by caustic substances, except poisoning, undetermined whether accidentally or purposely inflicted   ? Migraine headache   ? vestibular migraine  ? Obesity   ? Osteopenia   ? Restless leg syndrome   ? S/P laparoscopic supracervical hysterectomy 2004  ? Sarcoidosis   ? Self-inflicted laceration of left wrist ([place]) 09/19/2019  ? Sleep apnea   ? Syncope \  ?  ? ?Allergies  ?Allergen Reactions  ? Cleocin [Clindamycin Hcl] Other (See Comments)  ?  GI distress  ?  ? ?Outpatient Medications Prior to Visit  ?Medication Sig Dispense Refill  ? ALPRAZolam (XANAX) 1 MG tablet Take 1 mg by mouth 3 (three) times daily as needed for anxiety.    ? amphetamine-dextroamphetamine (ADDERALL) 30 MG tablet Take 30 mg by mouth daily.    ? benztropine (COGENTIN) 1 MG tablet Take 1 mg by mouth 2 (two) times daily. Dr. Toy Care    ? Cholecalciferol (VITAMIN D3) 50 MCG (2000 UT) capsule Take 2,000 Units by mouth daily.    ? conjugated estrogens (PREMARIN) vaginal cream Insert 1 g vaginally nightly for 7 nights then once weekly as maintenance 30 g 2  ? cyclobenzaprine (FLEXERIL) 10 MG tablet TAKE ONE TABLET BY MOUTH 3 TIMES DAILY AS NEEDED FOR MUSCLE SPASM 30 tablet 1  ? docusate sodium (COLACE) 100 MG capsule Take 100 mg by mouth 2 (two) times daily.    ? DULoxetine (CYMBALTA) 60 MG capsule Take 120 mg by mouth daily. Dr. Toy Care    ? furosemide (LASIX) 20 MG tablet TAKE 1 TABLET BY MOUTH DAILY AS NEEDED 30 tablet 1  ? gabapentin (NEURONTIN) 300 MG capsule Take 300 mg by mouth 2 (two) times daily.    ? hydrOXYzine (VISTARIL) 25 MG capsule Take 25 mg by mouth 3 (three) times daily as needed for anxiety.    ? levothyroxine (SYNTHROID) 100 MCG tablet Take 1 tablet (100 mcg total) by mouth daily before breakfast. 90 tablet 1  ? Melatonin 10 MG TABS Take 10 mg by mouth at bedtime.    ? Multiple Vitamin (MULTIVITAMIN) capsule Take 1 capsule by mouth daily.     ? Omega-3 Fatty Acids (FISH OIL) 1000 MG CAPS Take 1,000 mg by mouth daily.    ? pantoprazole (PROTONIX) 40 MG tablet TAKE ONE TABLET EVERY DAY 15-20 MINUTES BEFORE A MEAL (Patient taking differently: Take 40 mg by mouth 2 (two) times daily before a meal.) 30 tablet 1  ? primidone (MYSOLINE) 50 MG tablet TAKE 1 TABLET BY MOUTH TWICE  DAILY AT 10 AM &amp; 5 PM AND 2  TABLETS AT BEDTIME 360 tablet 3  ? primidone (MYSOLINE) 50 MG tablet Take 2 tablets (100 mg total) by mouth 3 (three) times daily. 540 tablet 0  ? rosuvastatin (CRESTOR) 5 MG tablet TAKE 1 TABLET BY MOUTH DAILY 90 tablet 0  ? topiramate (TOPAMAX) 200 MG tablet TAKE ONE TABLET BY MOUTH  EVERY MORNING AND TAKE ONE TABLET EVERY EVENING Strength: 200 mg 60 tablet 5  ? traZODone (DESYREL) 50 MG tablet Take 150 mg by mouth at bedtime.    ? ALPRAZolam (XANAX XR) 1 MG 24 hr tablet Take 1 mg by mouth daily. Dr. Toy Care (Patient not taking: Reported on 02/04/2022)    ? methylPREDNISolone (MEDROL DOSEPAK) 4 MG TBPK tablet TAKE AS DIRECTED PER PACKAGE (Patient not taking: Reported on 02/04/2022)    ? ?No facility-administered medications prior to visit.  ? ? ?Review of Systems  ?Constitutional:  Negative for chills, diaphoresis, fever, malaise/fatigue and weight loss.  ?HENT:  Negative for congestion.   ?Respiratory:  Positive for shortness of breath. Negative for cough, hemoptysis, sputum production and wheezing.   ?Cardiovascular:  Negative for chest pain, palpitations and leg swelling.  ? ? ?Objective:  ? ?Vitals:  ? 02/04/22 1545  ?BP: 130/70  ?Pulse: (!) 111  ?SpO2: 97%  ?Weight: 173 lb 6.4 oz (78.7 kg)  ?Height: 5\' 4"  (1.626 m)  ? ?SpO2: 97 % ?O2 Device: None (Room air) ? ?Physical Exam: ?General: Well-appearing, no acute distress ?HENT: [place], AT ?Eyes: EOMI, no scleral icterus ?Respiratory: Clear to auscultation bilaterally.  No crackles, wheezing or rales ?Cardiovascular: RRR, -M/R/G, no JVD ?Extremities:-Edema,-tenderness ?Neuro: AAO x4, CNII-XII grossly  intact ?Psych: Anxious and tearful mood, normal affect ? ?Data Reviewed: ? ?Imaging: ?- CT Chest 05/16/14 - Mediastinal and hilar lymphadenopathy and bilateral airspace opacities associated with patchy GGO ?- CT C

## 2022-02-05 ENCOUNTER — Encounter: Payer: Self-pay | Admitting: Pulmonary Disease

## 2022-02-05 DIAGNOSIS — N182 Chronic kidney disease, stage 2 (mild): Secondary | ICD-10-CM | POA: Diagnosis not present

## 2022-02-10 ENCOUNTER — Telehealth: Payer: Self-pay

## 2022-02-10 NOTE — Telephone Encounter (Signed)
Called pt she stated that she may have spoken to [facility] about counseling. Pt wants marriage counseling, as well as depression and PTSD. ? ?KP

## 2022-02-10 NOTE — Telephone Encounter (Signed)
Copied from [place] [id number]. Topic: General - Other ?>> Feb 10, 2022  2:02 PM Oneta Rack wrote: ?Reason for CRM: Caller did not want to elaborate but would like to speak with PCP nurse regarding information that is suppose to be sent to the patient. ?

## 2022-02-16 DIAGNOSIS — Z046 Encounter for general psychiatric examination, requested by authority: Secondary | ICD-10-CM | POA: Diagnosis present

## 2022-02-16 DIAGNOSIS — Z79899 Other long term (current) drug therapy: Secondary | ICD-10-CM | POA: Diagnosis not present

## 2022-02-16 DIAGNOSIS — Y900 Blood alcohol level of less than 20 mg/100 ml: Secondary | ICD-10-CM | POA: Diagnosis not present

## 2022-02-16 DIAGNOSIS — R000 Tachycardia, unspecified: Secondary | ICD-10-CM | POA: Insufficient documentation

## 2022-02-16 DIAGNOSIS — F410 Panic disorder [episodic paroxysmal anxiety] without agoraphobia: Secondary | ICD-10-CM | POA: Diagnosis not present

## 2022-02-16 LAB — SALICYLATE LEVEL: Salicylate Lvl: <7 mg/dL — ABNORMAL LOW (ref 7.0–30.0)

## 2022-02-16 LAB — COMPREHENSIVE METABOLIC PANEL
ALT: 23 U/L (ref 0–44)
AST: 22 U/L (ref 15–41)
Albumin: 4.5 g/dL (ref 3.5–5.0)
Alkaline Phosphatase: 61 U/L (ref 38–126)
Anion gap: 14 (ref 5–15)
BUN: 17 mg/dL (ref 6–20)
CO2: 21 mmol/L — ABNORMAL LOW (ref 22–32)
Calcium: 9.3 mg/dL (ref 8.9–10.3)
Chloride: 103 mmol/L (ref 98–111)
Creatinine, Ser: 0.99 mg/dL (ref 0.44–1.00)
GFR, Estimated: >60 mL/min (ref >60)
Glucose, Bld: 151 mg/dL — ABNORMAL HIGH (ref 70–99)
Potassium: 3.6 mmol/L (ref 3.5–5.1)
Sodium: 138 mmol/L (ref 135–145)
Total Bilirubin: 0.6 mg/dL (ref 0.3–1.2)
Total Protein: 7.8 g/dL (ref 6.5–8.1)

## 2022-02-16 LAB — CBC
HCT: 46.7 % — ABNORMAL HIGH (ref 36.0–46.0)
Hemoglobin: 15.4 g/dL — ABNORMAL HIGH (ref 12.0–15.0)
MCH: 28.9 pg (ref 26.0–34.0)
MCHC: 33 g/dL (ref 30.0–36.0)
MCV: 87.6 fL (ref 80.0–100.0)
Platelets: 257 10*3/uL (ref 150–400)
RBC: 5.33 MIL/uL — ABNORMAL HIGH (ref 3.87–5.11)
RDW: 12.6 % (ref 11.5–15.5)
WBC: 7.7 10*3/uL (ref 4.0–10.5)
nRBC: 0 % (ref 0.0–0.2)

## 2022-02-16 LAB — ETHANOL: Alcohol, Ethyl (B): <10 mg/dL (ref <10)

## 2022-02-16 LAB — ACETAMINOPHEN LEVEL: Acetaminophen (Tylenol), Serum: <10 ug/mL — ABNORMAL LOW (ref 10–30)

## 2022-02-16 NOTE — ED Notes (Addendum)
Pt dressed out by this RN & EDT Jenetta Downer) belongings include:  ? ?1 pair of white crocs ?1 pair of white/multicolored shorts ?1 pair of blue underwear ?1 gray tank top ? ?1 black rollater w/ pt label.  ? ? ?Pt permitted to wear her shorts due to not having the appropriate size scrub pants - Charge RN aware.  ?
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
## 2022-02-17 ENCOUNTER — Emergency Department
Admission: EM | Admit: 2022-02-17 | Discharge: 2022-02-17 | Disposition: A | Discharge status: Home / Self Care | Payer: Medicare Other | Attending: Emergency Medicine | Admitting: Emergency Medicine

## 2022-02-17 DIAGNOSIS — F410 Panic disorder [episodic paroxysmal anxiety] without agoraphobia: Secondary | ICD-10-CM

## 2022-02-17 NOTE — Discharge Instructions (Signed)
You have been seen in the Emergency Department (ED) today and we believe you are safe to be discharged from the hospital.   ? ?Please return to the ED immediately if you have ANY thoughts of hurting yourself or anyone else, so that we may help you. ? ?Please avoid alcohol and drug use.  Take your regular medications as prescribed. ? ?Follow up with your doctor and/or therapist as soon as possible regarding today's ED visit.   Please follow up any other recommendations and clinic appointments provided by the psychiatry team that saw you in the Emergency Department. ?

## 2022-02-17 NOTE — ED Notes (Signed)
Dr. York Cerise at the bedside for pt evaluation. Pt husband called to speak with MD as well.  ?
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
## 2022-02-24 ENCOUNTER — Telehealth: Payer: Self-pay

## 2022-02-24 ENCOUNTER — Other Ambulatory Visit: Payer: Self-pay

## 2022-02-24 ENCOUNTER — Telehealth: Payer: Self-pay | Admitting: Obstetrics and Gynecology

## 2022-02-24 DIAGNOSIS — K219 Gastro-esophageal reflux disease without esophagitis: Secondary | ICD-10-CM

## 2022-02-24 NOTE — Telephone Encounter (Signed)
Copied from [place]. Topic: General - Other ?>> Feb 24, 2022  2:23 PM Tessa Lerner A wrote: ?Reason for CRM: The patient has made contact requesting to speak with C McAdoo when possible  ? ?The patient reassured the agent that there is no emergency but declined the further elaborate during the time of call  ? ?Please contact further if/when available ?

## 2022-02-24 NOTE — Telephone Encounter (Signed)
Pt getting through pt assistance program (we send in fax for this). Pls find out how pt is supposed to get Rx and f/u with her. Also give she and her husband the instructions that were on Rx from her visit with me. Psl also tell pt I DO NOT need to see her for this and cancel appt. Thx.
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
## 2022-02-24 NOTE — Progress Notes (Signed)
gastro 

## 2022-02-24 NOTE — Telephone Encounter (Signed)
Pt called in about a vaginal cream that was prescribed for her.  She is under the impression that the cream was being sent here.  She is receiving the cream through a grant.  She is needing the cream.  She is also needing an explanation of how to use the cream.  I did schedule her for an appt on 5/18 for this explanation.  She would like for her husband to hear the explanation as well.   ?

## 2022-02-25 NOTE — Telephone Encounter (Signed)
Called pt and advised I do not have in hands paper we signed and faxed back (it has been shredded). She need to call the assistance program to inquire how the cream will be delivered. She says she received an acceptance letter so she will call number on letter. I have given instructions on how to place cream and appt has been cancelled. ?

## 2022-02-27 NOTE — Telephone Encounter (Signed)
Sample arrived. Labeled w/patient info and placed at front desk for pick up. Patient aware. ?

## 2022-03-05 ENCOUNTER — Ambulatory Visit: Payer: Medicare Other | Admitting: Obstetrics and Gynecology

## 2022-03-09 ENCOUNTER — Other Ambulatory Visit: Payer: Self-pay | Admitting: Internal Medicine

## 2022-03-10 NOTE — Telephone Encounter (Signed)
Requested Prescriptions  Pending Prescriptions Disp Refills  . furosemide (LASIX) 20 MG tablet [Pharmacy Med Name: FUROSEMIDE 20 MG TAB] 30 tablet 1    Sig: TAKE 1 TABLET BY MOUTH DAILY AS NEEDED     Cardiovascular:  Diuretics - Loop Failed - 03/09/2022  6:30 AM      Failed - Mg Level in normal range and within 180 days    Magnesium  Date Value Ref Range Status  07/25/2016 2.0 1.7 - 2.4 mg/dL Final         Failed - Last BP in normal range    BP Readings from Last 1 Encounters:  02/16/22 (!) 140/97         Passed - K in normal range and within 180 days    Potassium  Date Value Ref Range Status  02/16/2022 3.6 3.5 - 5.1 mmol/L Final  10/10/2014 3.8 3.5 - 5.1 mmol/L Final         Passed - Ca in normal range and within 180 days    Calcium  Date Value Ref Range Status  02/16/2022 9.3 8.9 - 10.3 mg/dL Final   Calcium, Total  Date Value Ref Range Status  10/10/2014 8.8 8.5 - 10.1 mg/dL Final   Calcium, Ion  Date Value Ref Range Status  11/19/2021 1.21 1.15 - 1.40 mmol/L Final         Passed - Na in normal range and within 180 days    Sodium  Date Value Ref Range Status  02/16/2022 138 135 - 145 mmol/L Final  02/02/2022 141 134 - 144 mmol/L Final  10/10/2014 143 136 - 145 mmol/L Final         Passed - Cr in normal range and within 180 days    Creatinine  Date Value Ref Range Status  10/10/2014 0.79 0.60 - 1.30 mg/dL Final   Creatinine, Ser  Date Value Ref Range Status  02/16/2022 0.99 0.44 - 1.00 mg/dL Final         Passed - Cl in normal range and within 180 days    Chloride  Date Value Ref Range Status  02/16/2022 103 98 - 111 mmol/L Final  10/10/2014 106 98 - 107 mmol/L Final         Passed - Valid encounter within last 6 months    Recent Outpatient Visits          1 month ago Mixed hyperlipidemia   [facility] Reubin Milan, MD   4 months ago Hoarseness of voice   [facility] Reubin Milan, MD   4 months ago Prediabetes    [facility] Reubin Milan, MD   8 months ago Annual physical exam   [facility] Reubin Milan, MD   11 months ago Hypothyroidism, unspecified type   [facility] Reubin Milan, MD      Future Appointments            In 2 months Midge Minium, MD [facility] [place]   In 3 months Luciano Cutter, MD [facility]

## 2022-04-01 ENCOUNTER — Ambulatory Visit: Payer: Self-pay

## 2022-04-01 ENCOUNTER — Telehealth: Payer: Self-pay | Admitting: Internal Medicine

## 2022-04-01 NOTE — Telephone Encounter (Signed)
Copied from CRM [id number]. Topic: General - Other >> Apr 01, 2022 12:04 PM Lynford Citizen wrote: Reason for CRM: Pt already spoke with NT today 6/14, scheduled an appt for tomorrow 6/15 for her leg pain, pt called back again still needing further advice, please advise.

## 2022-04-01 NOTE — Telephone Encounter (Signed)
  Chief Complaint: leg pain Symptoms: R calf pain that radiates into thigh, constant 15/10 pain level Frequency: gotten worse in last week  Pertinent Negatives: NA Disposition: [] ED /[] Urgent Care (no appt availability in office) / [x] Appointment(In office/virtual)/ []  [facility]/ [] Home Care/ [] Refused Recommended Disposition /[] [facility]/ []  Follow-up with PCP Additional Notes: pt states she was given medication for this pain but isn't touching the pain and neither is Ibuprofen. She also has tried warm/cold compresses. She states she also is having R upper arm pain as well. Offered her appt for tomorrow, pt needed to call her husband to see what time he can bring her since he doesn't drive and will call back to schedule appt.   Reason for Disposition  [1] SEVERE pain (e.g., excruciating, unable to do any normal activities) AND [2] not improved after 2 hours of pain medicine  Answer Assessment - Initial Assessment Questions 1. ONSET: "When did the pain start?"      On and off for some time, worse in last week  2. LOCATION: "Where is the pain located?"      R calf radiates into thigh, constant  3. PAIN: "How bad is the pain?"    (Scale 1-10; or mild, moderate, severe)   -  MILD (1-3): doesn't interfere with normal activities    -  MODERATE (4-7): interferes with normal activities (e.g., work or school) or awakens from sleep, limping    -  SEVERE (8-10): excruciating pain, unable to do any normal activities, unable to walk     15/10 6. OTHER SYMPTOMS: "Do you have any other symptoms?" (e.g., chest pain, back pain, breathing difficulty, swelling, rash, fever, numbness, weakness)     R arm pain  Protocols used: Leg Pain-A-AH

## 2022-04-01 NOTE — Telephone Encounter (Signed)
We will discuss patients symptoms at her appt tomorrow.

## 2022-04-01 NOTE — Telephone Encounter (Signed)
Pt called, Jennifer Bright to call back to schedule appt. Advised her Dr. Judithann Graves has opening tomorrow at 1020 as well.

## 2022-04-02 ENCOUNTER — Ambulatory Visit: Payer: Medicare Other | Admitting: Internal Medicine

## 2022-04-10 ENCOUNTER — Emergency Department
Admission: EM | Admit: 2022-04-10 | Discharge: 2022-04-18 | Disposition: E | Payer: Medicare Other | Attending: Emergency Medicine | Admitting: Emergency Medicine

## 2022-04-10 DIAGNOSIS — I469 Cardiac arrest, cause unspecified: Secondary | ICD-10-CM | POA: Insufficient documentation

## 2022-04-10 DIAGNOSIS — I468 Cardiac arrest due to other underlying condition: Secondary | ICD-10-CM

## 2022-04-10 LAB — CBC WITH DIFFERENTIAL/PLATELET
Abs Immature Granulocytes: 0.02 10*3/uL (ref 0.00–0.07)
Basophils Absolute: 0.1 10*3/uL (ref 0.0–0.1)
Basophils Relative: 2 %
Eosinophils Absolute: 0.2 10*3/uL (ref 0.0–0.5)
Eosinophils Relative: 4 %
HCT: 34.6 % — ABNORMAL LOW (ref 36.0–46.0)
Hemoglobin: 10.9 g/dL — ABNORMAL LOW (ref 12.0–15.0)
Immature Granulocytes: 1 %
Lymphocytes Relative: 49 %
Lymphs Abs: 2 10*3/uL (ref 0.7–4.0)
MCH: 29.5 pg (ref 26.0–34.0)
MCHC: 31.5 g/dL (ref 30.0–36.0)
MCV: 93.8 fL (ref 80.0–100.0)
Monocytes Absolute: 0.3 10*3/uL (ref 0.1–1.0)
Monocytes Relative: 7 %
Neutro Abs: 1.5 10*3/uL — ABNORMAL LOW (ref 1.7–7.7)
Neutrophils Relative %: 37 %
Platelets: 90 10*3/uL — ABNORMAL LOW (ref 150–400)
RBC: 3.69 MIL/uL — ABNORMAL LOW (ref 3.87–5.11)
RDW: 12.8 % (ref 11.5–15.5)
WBC: 4.1 10*3/uL (ref 4.0–10.5)
nRBC: 0 % (ref 0.0–0.2)

## 2022-04-10 LAB — COMPREHENSIVE METABOLIC PANEL
ALT: 63 U/L — ABNORMAL HIGH (ref 0–44)
AST: 86 U/L — ABNORMAL HIGH (ref 15–41)
Albumin: 3.2 g/dL — ABNORMAL LOW (ref 3.5–5.0)
Alkaline Phosphatase: 53 U/L (ref 38–126)
Anion gap: 12 (ref 5–15)
BUN: 15 mg/dL (ref 6–20)
CO2: 16 mmol/L — ABNORMAL LOW (ref 22–32)
Calcium: 9.2 mg/dL (ref 8.9–10.3)
Chloride: 114 mmol/L — ABNORMAL HIGH (ref 98–111)
Creatinine, Ser: 1.25 mg/dL — ABNORMAL HIGH (ref 0.44–1.00)
GFR, Estimated: 29 mL/min — ABNORMAL LOW (ref >60)
Glucose, Bld: 111 mg/dL — ABNORMAL HIGH (ref 70–99)
Potassium: 4.9 mmol/L (ref 3.5–5.1)
Sodium: 142 mmol/L (ref 135–145)
Total Bilirubin: 0.5 mg/dL (ref 0.3–1.2)
Total Protein: 5.7 g/dL — ABNORMAL LOW (ref 6.5–8.1)

## 2022-04-18 NOTE — ED Notes (Signed)
Cardiac ultrasound performed by MD and pulse check. No palpable pulse noted. Compressions resumed.
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
## 2022-04-18 DEATH — deceased

## 2022-05-25 ENCOUNTER — Ambulatory Visit: Payer: Medicare Other | Admitting: Gastroenterology

## 2022-06-08 ENCOUNTER — Ambulatory Visit: Payer: Medicare Other | Admitting: Pulmonary Disease

## 2022-08-26 ENCOUNTER — Ambulatory Visit: Payer: Medicare Other

## 2023-01-04 IMAGING — XA DG INJECT/[PERSON_NAME] INC NEEDLE/CATH/PLC EPI/CERV/THOR W/IMG
2 series · 2 of 2 positions shown · non-contrast
Comparison: none

CLINICAL DATA: Cervical spine pain. Symptoms worse on the right
than the left.

[Series 1: ortho standard · 1 of 1 slices shown (1 of 2)]
[im 1/1]
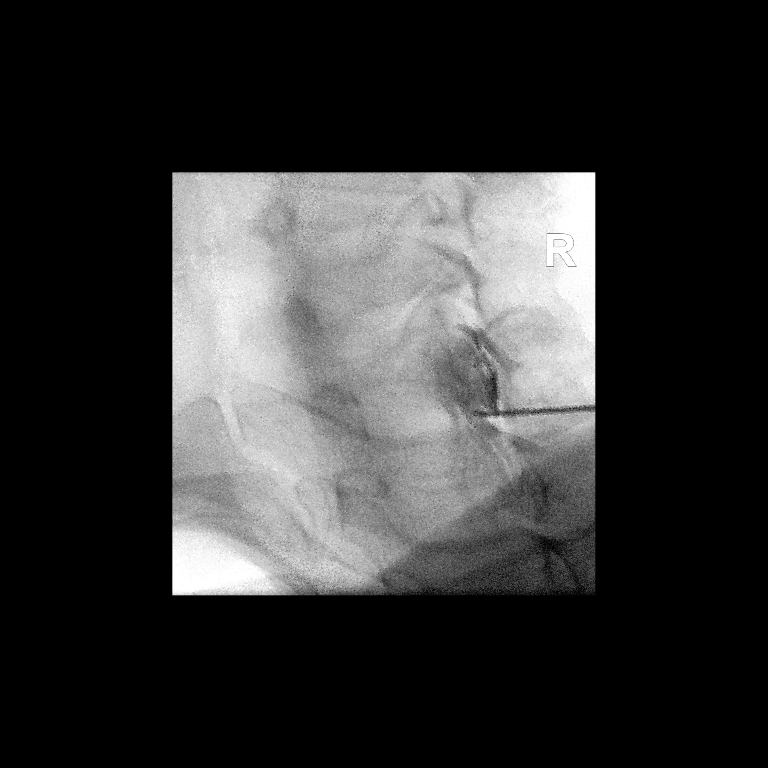

[Series 2: ortho standard · 1 of 1 slices shown (2 of 2)]
[im 1/1]
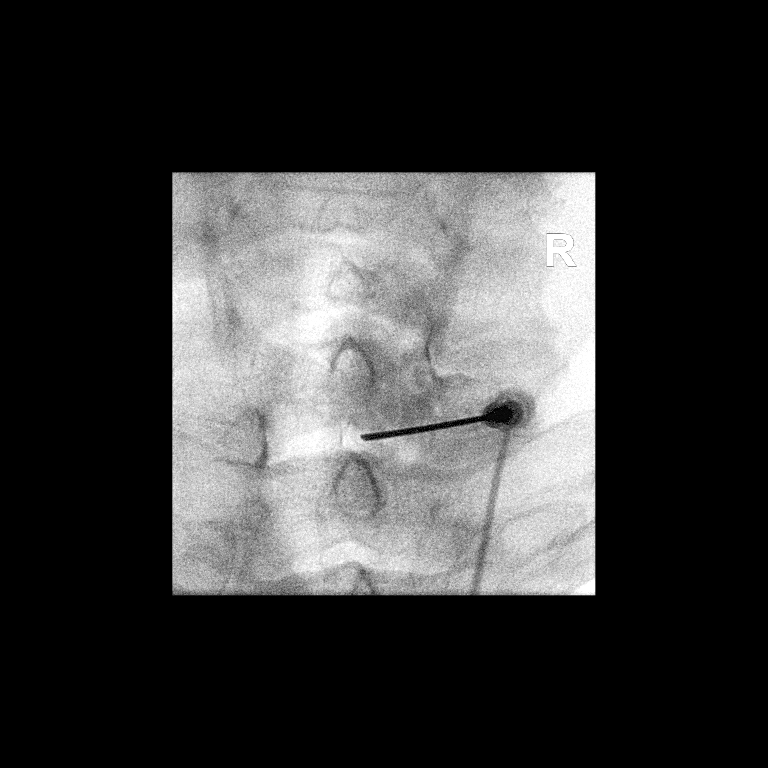

[2 of 2 positions shown; findings below may reference images not displayed]

FLUOROSCOPY TIME:  0 minutes 25 seconds. 6.03 micro gray meter
squared

PROCEDURE:
CERVICAL EPIDURAL INJECTION

An interlaminar approach was performed on the right at C7-T1. A 20
gauge epidural needle was advanced using loss-of-resistance
technique.

DIAGNOSTIC EPIDURAL INJECTION

Injection of Isovue-M 300 shows a good epidural pattern with spread
above and below the level of needle placement, primarily on the
right. No vascular opacification is seen. THERAPEUTIC

EPIDURAL INJECTION

1.5 ml of Kenalog 40 mixed with 1 ml of 1% Lidocaine and 2 ml of
normal saline were then instilled. The procedure was well-tolerated,
and the patient was discharged thirty minutes following the
injection in good condition.
IMPRESSION: Technically successful initial epidural injection on the right at
C7-T1.
